# Patient Record
Sex: Male | Born: 1970
Health system: Southern US, Community
[De-identification: ages and names within clinical notes are randomized; demographics above are authoritative.]

## PROBLEM LIST (undated history)

## (undated) DIAGNOSIS — D869 Sarcoidosis, unspecified: Secondary | ICD-10-CM

## (undated) DIAGNOSIS — N049 Nephrotic syndrome with unspecified morphologic changes: Secondary | ICD-10-CM

## (undated) DIAGNOSIS — I10 Essential (primary) hypertension: Secondary | ICD-10-CM

## (undated) DIAGNOSIS — R0600 Dyspnea, unspecified: Secondary | ICD-10-CM

## (undated) DIAGNOSIS — M1990 Unspecified osteoarthritis, unspecified site: Secondary | ICD-10-CM

## (undated) DIAGNOSIS — G4730 Sleep apnea, unspecified: Secondary | ICD-10-CM

## (undated) DIAGNOSIS — I509 Heart failure, unspecified: Secondary | ICD-10-CM

## (undated) DIAGNOSIS — R7303 Prediabetes: Secondary | ICD-10-CM

## (undated) HISTORY — DX: Sarcoidosis, unspecified: D86.9

## (undated) HISTORY — DX: Nephrotic syndrome with unspecified morphologic changes: N04.9

---

## 1997-08-02 ENCOUNTER — Emergency Department (HOSPITAL_COMMUNITY): Admission: EM | Admit: 1997-08-02 | Discharge: 1997-08-02 | Payer: Self-pay | Admitting: Emergency Medicine

## 1998-06-30 ENCOUNTER — Emergency Department (HOSPITAL_COMMUNITY): Admission: EM | Admit: 1998-06-30 | Discharge: 1998-06-30 | Payer: Self-pay | Admitting: Emergency Medicine

## 2007-08-09 ENCOUNTER — Emergency Department (HOSPITAL_COMMUNITY): Admission: EM | Admit: 2007-08-09 | Discharge: 2007-08-09 | Payer: Self-pay | Admitting: Emergency Medicine

## 2009-05-19 ENCOUNTER — Emergency Department (HOSPITAL_COMMUNITY): Admission: EM | Admit: 2009-05-19 | Discharge: 2009-05-19 | Payer: Self-pay | Admitting: Family Medicine

## 2009-06-18 ENCOUNTER — Emergency Department (HOSPITAL_COMMUNITY): Admission: EM | Admit: 2009-06-18 | Discharge: 2009-06-18 | Payer: Self-pay | Admitting: Family Medicine

## 2014-03-26 ENCOUNTER — Emergency Department (HOSPITAL_COMMUNITY)
Admission: EM | Admit: 2014-03-26 | Discharge: 2014-03-26 | Disposition: A | Discharge status: Home / Self Care | Payer: 59 | Source: Home / Self Care | Attending: Family Medicine | Admitting: Family Medicine

## 2014-03-26 ENCOUNTER — Encounter (HOSPITAL_COMMUNITY): Payer: Self-pay | Admitting: *Deleted

## 2014-03-26 DIAGNOSIS — H6981 Other specified disorders of Eustachian tube, right ear: Secondary | ICD-10-CM

## 2014-03-26 HISTORY — DX: Sarcoidosis, unspecified: D86.9

## 2014-03-26 MED ORDER — IPRATROPIUM BROMIDE 0.06 % NA SOLN: 2.0000 | Freq: Four times a day (QID) | NASAL | Status: DC

## 2014-03-26 NOTE — ED Notes (Signed)
Pt  Reports  His  r  Ear  Feels   Clogged    As    Had   Some  Pain  Earlier       - pain is    Better  Now        He  denys  Any   Shortness  Of  Breath    He  Is  Sitting  Upright  On   Exam    Table  Pt  Has  A  History  Of  Sarcoidosis

## 2014-03-26 NOTE — Discharge Instructions (Signed)
Eustachian tube dysfunction Barotitis media (eustachian tube dysfunction) is inflammation of your middle ear. This occurs when the auditory tube (eustachian tube) leading from the back of your nose (nasopharynx) to your eardrum is blocked. This blockage may result from a cold, environmental allergies, or an upper respiratory infection. Unresolved barotitis media may lead to damage or hearing loss (barotrauma), which may become permanent. HOME CARE INSTRUCTIONS   Use medicines as recommended by your health care provider. Over-the-counter medicines will help unblock the canal and can help during times of air travel.  Do not put anything into your ears to clean or unplug them. Eardrops will not be helpful.  Do not swim, dive, or fly until your health care provider says it is all right to do so. If these activities are necessary, chewing gum with frequent, forceful swallowing may help. It is also helpful to hold your nose and gently blow to pop your ears for equalizing pressure changes. This forces air into the eustachian tube.  Only take over-the-counter or prescription medicines for pain, discomfort, or fever as directed by your health care provider.  A decongestant may be helpful in decongesting the middle ear and make pressure equalization easier. SEEK MEDICAL CARE IF:  You experience a serious form of dizziness in which you feel as if the room is spinning and you feel nauseated (vertigo).  Your symptoms only involve one ear. SEEK IMMEDIATE MEDICAL CARE IF:   You develop a severe headache, dizziness, or severe ear pain.  You have bloody or pus-like drainage from your ears.  You develop a fever.  Your problems do not improve or become worse. MAKE SURE YOU:   Understand these instructions.  Will watch your condition.  Will get help right away if you are not doing well or get worse. Document Released: 01/23/2000 Document Revised: 11/15/2012 Document Reviewed: 08/22/2012 [facility]  Patient Information 2015 [employer] [place]. This information is not intended to replace advice given to you by your health care provider. Make sure you discuss any questions you have with your health care provider.

## 2014-03-26 NOTE — ED Provider Notes (Signed)
CSN: [id number]     Arrival date & time 03/26/14  1505 History   First MD Initiated Contact with Patient 03/26/14 1530     Chief Complaint  Patient presents with  . Otalgia   (Consider location/radiation/quality/duration/timing/severity/associated sxs/prior Treatment) HPI   Patient here with 3 weeks of right ear congestion and muffled hearing intermittently. He explains that it began without any cold symptoms and has persisted throughout this time. He's tried mucinex without any improvement. He states that is not painful but it's irritating. He denies dyspnea, chest pain, change in oral intake, fever, chills, abdominal pain, or swelling. States he has a history of sarcoidosis and has not been taking prednisone for at least 6 months.    Past Medical History  Diagnosis Date  . Sarcoid    History reviewed. No pertinent past surgical history. History reviewed. No pertinent family history. History  Substance Use Topics  . Smoking status: Current Some Day Smoker  . Smokeless tobacco: Not on file  . Alcohol Use: Yes    Review of Systems   Per HPI  Allergies  Review of patient's allergies indicates no known allergies.  Home Medications   Prior to Admission medications   Medication Sig Start Date End Date Taking? Authorizing Provider  ipratropium (ATROVENT) 0.06 % nasal spray Place 2 sprays into both nostrils 4 (four) times daily. 03/26/14   Elenora GammaSamuel L Bradshaw, MD   BP 168/95 mmHg  Pulse 88  Temp(Src) 98.9 F (37.2 C) (Oral)  Resp 16  SpO2 93% Physical Exam  Constitutional: He appears well-developed and well-nourished. No distress.  HENT:  Head: Normocephalic and atraumatic.  Eyes: Right eye exhibits no discharge. Left eye exhibits no discharge.  Neck: Neck supple.  Cardiovascular: Normal rate, regular rhythm and normal heart sounds.  Exam reveals no friction rub.   No murmur heard. Pulmonary/Chest: Effort normal and breath sounds normal. No respiratory distress.   Musculoskeletal: He exhibits no edema.  Lymphadenopathy:    He has no cervical adenopathy.  Nursing note and vitals reviewed.   ED Course  Procedures (including critical care time) Labs Review Labs Reviewed - No data to display  Imaging Review No results found.   MDM   1. Eustachian tube dysfunction, right    44 year old male with sarcoidosis here with eustachian tube dysfunction. Discussed supportive care with him including nasal saline and over-the-counter allergy medications. Also given a prescription for Atrovent nasal spray to help dry up secretions. No signs of otitis media on exam.   Discussed extensively with him his hypertension and borderline oxygen saturations. He has a history of sarcoidosis and has not been off of prednisone for several months. I recommended that he establish care with a PCP and return to see his pulmonologist.  Pt seen and discussed with Dr. Artis FlockKindl and he agrees with the plan as discussed above.      Elenora GammaSamuel L Bradshaw, MD 03/26/14 [phone]

## 2014-07-02 ENCOUNTER — Institutional Professional Consult (permissible substitution): Payer: 59 | Admitting: Pulmonary Disease

## 2014-09-05 ENCOUNTER — Encounter: Payer: Self-pay | Admitting: Dietician

## 2014-09-05 ENCOUNTER — Encounter: Payer: 59 | Attending: Physician Assistant | Admitting: Dietician

## 2014-09-05 DIAGNOSIS — Z6841 Body Mass Index (BMI) 40.0 and over, adult: Secondary | ICD-10-CM | POA: Diagnosis not present

## 2014-09-05 DIAGNOSIS — Z713 Dietary counseling and surveillance: Secondary | ICD-10-CM | POA: Diagnosis not present

## 2014-09-05 NOTE — Patient Instructions (Signed)
Aim to have 3 meals per day and 2-3 snacks per day if you are hungry Choose a lower fat milk or unsweetened almond milk. Have protein (egg) with carbs (cereal/bread) for breakfast. Aim to fill half of your plate with vegetables at lunch and dinner. Have lean protein the size of the palm of your hand and starches on the quarter of your plate.  If you do the Nutribullet have 1 cup of fruit, greens, and have with protein (nuts).  Try using a small plate or small bowl to help with serving sizes.  Plan to walk 2-3 x week for 35-40 minutes.

## 2014-09-05 NOTE — Progress Notes (Signed)
  Medical Nutrition Therapy:  Appt start time: 1120 end time:  1220.   Assessment:  Primary concerns today: Hunter [name] is here today since he would like to lose weight. Started to feel bloated in April even after not eating for awhile and not sure why. Doctor was not sure why he was bloated either. Has been having some swelling in legs and feet. Was given cholesterol medication and blood pressure medication but stopped them since he did not feel good taking them. Weight has been 280-295 lbs in recent years and is now 307 lbs (highest weight was 315 lbs). Started taking Hydrycut for about 30 days which has been working. Eats a lot of fruits and vegetables but struggles to be able to shop frequently. Does not eat greasy foods or foods with butter. Has not been exercising much though does play basketball sometimes.   "Loves to eat" and thinks that he overeats. Would like to lose at least 50 lbs.   Drives a forklift for work. Will be starting back to work soon (unemployed now). Lives by himself. Eats out 4-5 x week and misses meals frequently. Feels hungry throughout the day. Portions have been small lately.    Has cut out beer, cakes, cookies, and chips.    Preferred Learning Style:   No preference indicated   Learning Readiness:   Ready  MEDICATIONS: no prescription medication   DIETARY INTAKE:  Usual eating pattern includes 1-3 meals and 1-3 snacks per day.  Avoided foods include none    24-hr recall:  B ( AM): nothing or Cheerios/Raisin Bran with whole milk or coffee with flavored creamed with bagel and cream Snk ( AM):crackers with glass of tomato juice  L ( PM): sandwich with ham, cheese, mayo, and bologna with fruit or tuna sandwich Snk ( PM):  Cheerios/Raisin Bran with whole milk or fruit D ( PM): steak with corn  Snk ( PM): Cheerios/Raisin Bran with whole milk  Beverages: water, tomato juice, coffee, 1 mixed drink per week (gin and juice)  Usual physical activity: basketball  every other week  Estimated energy needs: 2200 calories 248 g carbohydrates 165 g protein 61 g fat  Progress Towards Goal(s):  In progress.   Nutritional Diagnosis:  Leon-3.3 Overweight/obesity As related to hx of energy dense food choices.  As evidenced by BMI of 41.7.    Intervention:  Nutrition counseling provided. Plan: Aim to have 3 meals per day and 2-3 snacks per day if you are hungry Choose a lower fat milk or unsweetened almond milk. Have protein (egg) with carbs (cereal/bread) for breakfast. Aim to fill half of your plate with vegetables at lunch and dinner. Have lean protein the size of the palm of your hand and starches on the quarter of your plate.  If you do the Nutribullet have 1 cup of fruit, greens, and have with protein (nuts).  Try using a small plate or small bowl to help with serving sizes.  Plan to walk 2-3 x week for 35-40 minutes.  Teaching Method Utilized:  Visual Auditory Hands on  Handouts given during visit include:  Meal card  MyPlate Handout  15 g CHO Snacks  Barriers to learning/adherence to lifestyle change: lives alone  Demonstrated degree of understanding via:  Teach Back   Monitoring/Evaluation:  Dietary intake, exercise, and body weight prn.

## 2014-11-04 ENCOUNTER — Other Ambulatory Visit: Payer: Self-pay | Admitting: Cardiology

## 2014-11-04 ENCOUNTER — Encounter: Payer: Self-pay | Admitting: Cardiology

## 2014-11-04 ENCOUNTER — Ambulatory Visit
Admission: RE | Admit: 2014-11-04 | Discharge: 2014-11-04 | Disposition: A | Discharge status: Home / Self Care | Payer: 59 | Source: Ambulatory Visit | Attending: Cardiology | Admitting: Cardiology

## 2014-11-04 DIAGNOSIS — R0602 Shortness of breath: Secondary | ICD-10-CM

## 2014-11-15 ENCOUNTER — Other Ambulatory Visit: Payer: Self-pay | Admitting: Cardiology

## 2014-11-15 DIAGNOSIS — R0602 Shortness of breath: Secondary | ICD-10-CM

## 2014-11-25 ENCOUNTER — Ambulatory Visit
Admission: RE | Admit: 2014-11-25 | Discharge: 2014-11-25 | Disposition: A | Discharge status: Home / Self Care | Payer: 59 | Source: Ambulatory Visit | Attending: Cardiology | Admitting: Cardiology

## 2014-11-25 DIAGNOSIS — R0602 Shortness of breath: Secondary | ICD-10-CM

## 2014-11-25 MED ORDER — IOPAMIDOL (ISOVUE-300) INJECTION 61%
75.0000 mL | Freq: Once | INTRAVENOUS | Status: AC | PRN
  Administered 2014-11-25: 75 mL via INTRAVENOUS

## 2015-03-26 ENCOUNTER — Other Ambulatory Visit (HOSPITAL_COMMUNITY): Payer: Self-pay | Admitting: Nephrology

## 2015-03-26 DIAGNOSIS — D869 Sarcoidosis, unspecified: Secondary | ICD-10-CM

## 2015-03-26 DIAGNOSIS — N049 Nephrotic syndrome with unspecified morphologic changes: Secondary | ICD-10-CM

## 2015-03-26 DIAGNOSIS — I272 Pulmonary hypertension, unspecified: Secondary | ICD-10-CM

## 2015-03-26 DIAGNOSIS — I50811 Acute right heart failure: Secondary | ICD-10-CM

## 2015-03-26 DIAGNOSIS — G4700 Insomnia, unspecified: Secondary | ICD-10-CM

## 2015-05-02 ENCOUNTER — Other Ambulatory Visit: Payer: Self-pay | Admitting: Radiology

## 2015-05-05 ENCOUNTER — Ambulatory Visit (HOSPITAL_COMMUNITY)
Admission: RE | Admit: 2015-05-05 | Discharge: 2015-05-05 | Disposition: A | Discharge status: Home / Self Care | Payer: 59 | Source: Ambulatory Visit | Attending: Nephrology | Admitting: Nephrology

## 2015-05-05 ENCOUNTER — Ambulatory Visit (HOSPITAL_COMMUNITY)
Admission: RE | Admit: 2015-05-05 | Discharge: 2015-05-05 | Disposition: A | Discharge status: Home / Self Care | Payer: 59 | Source: Ambulatory Visit | Attending: Interventional Radiology | Admitting: Interventional Radiology

## 2015-05-05 ENCOUNTER — Encounter (HOSPITAL_COMMUNITY): Payer: Self-pay

## 2015-05-05 DIAGNOSIS — E1121 Type 2 diabetes mellitus with diabetic nephropathy: Secondary | ICD-10-CM | POA: Diagnosis not present

## 2015-05-05 DIAGNOSIS — I10 Essential (primary) hypertension: Secondary | ICD-10-CM | POA: Diagnosis not present

## 2015-05-05 DIAGNOSIS — N049 Nephrotic syndrome with unspecified morphologic changes: Secondary | ICD-10-CM | POA: Insufficient documentation

## 2015-05-05 DIAGNOSIS — R809 Proteinuria, unspecified: Secondary | ICD-10-CM | POA: Diagnosis not present

## 2015-05-05 DIAGNOSIS — I272 Other secondary pulmonary hypertension: Secondary | ICD-10-CM | POA: Diagnosis not present

## 2015-05-05 DIAGNOSIS — I50811 Acute right heart failure: Secondary | ICD-10-CM

## 2015-05-05 DIAGNOSIS — D869 Sarcoidosis, unspecified: Secondary | ICD-10-CM | POA: Insufficient documentation

## 2015-05-05 DIAGNOSIS — R0902 Hypoxemia: Secondary | ICD-10-CM | POA: Diagnosis not present

## 2015-05-05 DIAGNOSIS — Z79899 Other long term (current) drug therapy: Secondary | ICD-10-CM | POA: Diagnosis not present

## 2015-05-05 DIAGNOSIS — F17210 Nicotine dependence, cigarettes, uncomplicated: Secondary | ICD-10-CM | POA: Insufficient documentation

## 2015-05-05 DIAGNOSIS — J90 Pleural effusion, not elsewhere classified: Secondary | ICD-10-CM | POA: Diagnosis not present

## 2015-05-05 DIAGNOSIS — G4700 Insomnia, unspecified: Secondary | ICD-10-CM

## 2015-05-05 DIAGNOSIS — N269 Renal sclerosis, unspecified: Secondary | ICD-10-CM | POA: Diagnosis not present

## 2015-05-05 HISTORY — DX: Essential (primary) hypertension: I10

## 2015-05-05 LAB — CBC
HEMATOCRIT: 57.2 % — AB (ref 39.0–52.0)
HEMOGLOBIN: 17.4 g/dL — AB (ref 13.0–17.0)
MCH: 26.9 pg (ref 26.0–34.0)
MCHC: 30.4 g/dL (ref 30.0–36.0)
MCV: 88.3 fL (ref 78.0–100.0)
Platelets: 139 10*3/uL — ABNORMAL LOW (ref 150–400)
RBC: 6.48 MIL/uL — AB (ref 4.22–5.81)
RDW: 16.8 % — AB (ref 11.5–15.5)
WBC: 3.3 10*3/uL — AB (ref 4.0–10.5)

## 2015-05-05 LAB — PROTIME-INR
INR: 0.96 (ref 0.00–1.49)
Prothrombin Time: 13 seconds (ref 11.6–15.2)

## 2015-05-05 LAB — APTT: APTT: 30 s (ref 24–37)

## 2015-05-05 MED ORDER — FENTANYL CITRATE (PF) 100 MCG/2ML IJ SOLN
INTRAMUSCULAR | Status: AC | PRN
  Administered 2015-05-05 (×2): 25 ug via INTRAVENOUS

## 2015-05-05 MED ORDER — HYDRALAZINE HCL 20 MG/ML IJ SOLN
INTRAMUSCULAR | Status: AC
  Filled 2015-05-05: qty 2

## 2015-05-05 MED ORDER — HYDRALAZINE HCL 20 MG/ML IJ SOLN
10.0000 mg | Freq: Once | INTRAMUSCULAR | Status: AC
  Administered 2015-05-05: 10 mg via INTRAVENOUS

## 2015-05-05 MED ORDER — LIDOCAINE HCL (PF) 1 % IJ SOLN
INTRAMUSCULAR | Status: AC
  Filled 2015-05-05: qty 10

## 2015-05-05 MED ORDER — HYDROCODONE-ACETAMINOPHEN 5-325 MG PO TABS: 1.0000 | ORAL_TABLET | ORAL | Status: DC | PRN

## 2015-05-05 MED ORDER — FENTANYL CITRATE (PF) 100 MCG/2ML IJ SOLN
INTRAMUSCULAR | Status: AC
  Filled 2015-05-05: qty 2

## 2015-05-05 MED ORDER — MIDAZOLAM HCL 2 MG/2ML IJ SOLN
INTRAMUSCULAR | Status: AC | PRN
Start: 2015-05-05 — End: 2015-05-05
  Administered 2015-05-05: 0.5 mg via INTRAVENOUS

## 2015-05-05 MED ORDER — MIDAZOLAM HCL 2 MG/2ML IJ SOLN
INTRAMUSCULAR | Status: AC
  Filled 2015-05-05: qty 2

## 2015-05-05 MED ORDER — SODIUM CHLORIDE 0.9 % IV SOLN: Freq: Once | INTRAVENOUS | Status: DC

## 2015-05-05 MED ORDER — LISINOPRIL 20 MG PO TABS
20.0000 mg | ORAL_TABLET | Freq: Once | ORAL | Status: AC
  Administered 2015-05-05: 20 mg via ORAL
  Filled 2015-05-05: qty 1

## 2015-05-05 MED ORDER — FUROSEMIDE 10 MG/ML IJ SOLN
40.0000 mg | Freq: Once | INTRAMUSCULAR | Status: AC
Start: 2015-05-05 — End: 2015-05-05
  Administered 2015-05-05: 40 mg via INTRAVENOUS
  Filled 2015-05-05: qty 4

## 2015-05-05 MED ORDER — FUROSEMIDE 10 MG/ML IJ SOLN: 40.0000 mg | Freq: Once | INTRAMUSCULAR | Status: DC

## 2015-05-05 NOTE — Progress Notes (Signed)
Pt awake and alert. In no distress. Here for renal biopsy. When placed on monitor pt with Sa02 68-72%. States he does not use oxygen at home. Also states he feels better and breathes better when he lays on his right side. When pt layed on right side Sa02 remained unchanged. Placed patient on 2L [place] with Sa02 88-90%.

## 2015-05-05 NOTE — Progress Notes (Signed)
Pt with Sa02 84-85% on RA. 91% on 3L [place].

## 2015-05-05 NOTE — Procedures (Signed)
Interventional Radiology Procedure Note  Procedure:  Ultrasound guided left renal biopsy  Complications:  None  Estimated Blood Loss: < 10 mL  16 G core biopsy performed x 2 of lower pole left renal cortex.  No immediate complications.  Jodi MarbleGlenn T. Fredia SorrowYamagata, M.D Pager:  [phone]

## 2015-05-05 NOTE — Progress Notes (Signed)
Sa02 95% on 3L [place]

## 2015-05-05 NOTE — H&P (Signed)
Chief Complaint: Patient was seen in consultation today for random renal biopsy at the request of Hunter Hunter [name]  Referring Physician(s): Hunter Hunter [name]  Supervising Physician: Hunter Hunter [name]  History of Present Illness: Hunter Hunter [name] is Hunter [name] 45 y.o. male   Pt presented to MD in May 2016 Complaining of wt gain and lower extremity edema Shortness of breath Work up revealed Pulmonary HTN---CT excluded PE + Sarcoidosis + proteinuria Nephrotic syndrome Request for random renal biopsy per Hunter Hunter [name]  + smoker Restless leg syndrome  Pt presents today with 02 sat 60-70% RA 95% 3L 02 BP 162/105 - without am meds not sure what he runs at home    Past Medical History  Diagnosis Date  . Sarcoid (HCC)   . Hypertension     History reviewed. No pertinent past surgical history.  Allergies: Review of patient's allergies indicates no known allergies.  Medications: Prior to Admission medications   Medication Sig Start Date End Date Taking? Authorizing Provider  atorvastatin (LIPITOR) 20 MG tablet Take 20 mg by mouth daily. 03/06/15  Yes Historical Provider, MD  furosemide (LASIX) 40 MG tablet Take 40 mg by mouth daily. 03/06/15  Yes Historical Provider, MD  lisinopril (PRINIVIL,ZESTRIL) 20 MG tablet Take 20 mg by mouth daily. 03/06/15  Yes Historical Provider, MD  Multiple Vitamin (MULTIVITAMIN WITH MINERALS) TABS tablet Take 1 tablet by mouth daily.   Yes Historical Provider, MD  naproxen sodium (ALEVE) 220 MG tablet Take 220 mg by mouth 2 (two) times daily as needed (pain).   Yes Historical Provider, MD  zolpidem (AMBIEN) 5 MG tablet Take 5 mg by mouth at bedtime as needed. 03/19/15  Yes Historical Provider, MD  ipratropium (ATROVENT) 0.06 % nasal spray Place 2 sprays into both nostrils 4 (four) times daily. Patient not taking: Reported on 05/02/2015 03/26/14   Hunter Gamma, MD     History reviewed. No pertinent family history.  Social History   Social  History  . Marital Status: Single    Spouse Name: N/Hunter [name]  . Number of Children: N/Hunter [name]  . Years of Education: N/Hunter [name]   Social History Main Topics  . Smoking status: Current Some Day Smoker -- 0.50 packs/day for 10 years    Types: Cigarettes  . Smokeless tobacco: None  . Alcohol Use: 1.8 oz/week    3 Cans of beer per week  . Drug Use: None  . Sexual Activity: Not Asked   Other Topics Concern  . None   Social History Narrative     Review of Systems: Hunter [name] 12 point ROS discussed and pertinent positives are indicated in the HPI above.  All other systems are negative.  Review of Systems  Constitutional: Positive for diaphoresis, activity change, appetite change and unexpected weight change. Negative for fever.  Respiratory: Positive for cough, shortness of breath and wheezing.   Cardiovascular: Positive for leg swelling. Negative for chest pain.  Neurological: Positive for weakness.  Psychiatric/Behavioral: Negative for behavioral problems and confusion.    Vital Signs: BP 170/112 mmHg  Pulse 82  Temp(Src) 98.2 F (36.8 C) (Oral)  Resp 22  Ht 6' (1.829 m)  Wt 307 lb (139.254 kg)  BMI 41.63 kg/m2  SpO2 75%  Physical Exam  Constitutional: He is oriented to person, place, and time.  Cardiovascular: Normal rate, regular rhythm and normal heart sounds.   Pulmonary/Chest: Effort normal. He has wheezes. He has rales.  Abdominal: Soft. Bowel sounds are normal. There is no tenderness.  Neurological: He is alert and oriented to  person, place, and time.  Skin: Skin is warm and dry.  Psychiatric: He has Hunter [name] normal mood and affect. His behavior is normal. Judgment and thought content normal.  Nursing note and vitals reviewed.   Mallampati Score:  MD Evaluation Airway: WNL Heart: WNL Abdomen: WNL Chest/ Lungs: WNL ASA  Classification: 3 Mallampati/Airway Score: One  Imaging: No results found.  Labs:  CBC:  Recent Labs  05/05/15 0620  WBC 3.3*  HGB 17.4*  HCT 57.2*  PLT 139*     COAGS:  Recent Labs  05/05/15 0620  INR 0.96  APTT 30    BMP: No results for input(s): NA, K, CL, CO2, GLUCOSE, BUN, CALCIUM, CREATININE, GFRNONAA, GFRAA in the last 8760 hours.  Invalid input(s): CMP  LIVER FUNCTION TESTS: No results for input(s): BILITOT, AST, ALT, ALKPHOS, PROT, ALBUMIN in the last 8760 hours.  TUMOR MARKERS: No results for input(s): AFPTM, CEA, CA199, CHROMGRNA in the last 8760 hours.  Assessment and Plan:  Wt gain Lower extremity edema Proteinuria Nephrotic syndrome Pulm HTN; rales and wheezing on exam----for CXR per Hunter Fredia SorrowYamagata Risks and Benefits discussed with the patient including, but not limited to bleeding, infection, damage to adjacent structures or low yield requiring additional tests. All of the patient's questions were answered, patient is agreeable to proceed. Consent signed and in chart.  Checking CXR and BP   Thank you for this interesting consult.  I greatly enjoyed meeting Hunter Hunter [name] and look forward to participating in their care.  Hunter [name] copy of this report was sent to the requesting provider on this date.  Electronically Signed: Ralene MuskratURPIN,Hunter Hunter [name] 05/05/2015, 7:50 AM   I spent Hunter [name] total of  30 Minutes   in face to face in clinical consultation, greater than 50% of which was counseling/coordinating care for random renal bx

## 2015-05-05 NOTE — Progress Notes (Signed)
Pt upset and ready to go home. Agitated. Sats 95% BP improved

## 2015-05-05 NOTE — Sedation Documentation (Addendum)
Pt states he is breathing fine- no distress noted. When pt arrived this am he was in 70's R AIR and in no distress.]MD aware

## 2015-05-05 NOTE — Progress Notes (Signed)
Pt sleeping quietly. Remains on 3 L [place].

## 2015-05-05 NOTE — Progress Notes (Signed)
Pt VS stable, Sats 91 % RAIR- improved from RAIR sats on arrival. Pts friend will pick him up at 3:45 at main entrance. Pt feels good, bandaid dry, intact

## 2015-05-21 ENCOUNTER — Encounter (HOSPITAL_COMMUNITY): Payer: Self-pay

## 2015-05-22 ENCOUNTER — Encounter (HOSPITAL_COMMUNITY): Payer: Self-pay

## 2015-12-29 LAB — PULMONARY FUNCTION TEST

## 2016-02-11 ENCOUNTER — Other Ambulatory Visit: Payer: Self-pay | Admitting: Internal Medicine

## 2016-02-11 DIAGNOSIS — R0600 Dyspnea, unspecified: Secondary | ICD-10-CM

## 2016-02-12 ENCOUNTER — Ambulatory Visit (INDEPENDENT_AMBULATORY_CARE_PROVIDER_SITE_OTHER): Payer: 59 | Admitting: Internal Medicine

## 2016-02-12 DIAGNOSIS — R0600 Dyspnea, unspecified: Secondary | ICD-10-CM

## 2016-02-12 LAB — PULMONARY FUNCTION TEST
DL/VA % PRED: 137 %
DL/VA: 6.5 ml/min/mmHg/L
DLCO COR: 21.05 ml/min/mmHg
DLCO UNC % PRED: 62 %
DLCO cor % pred: 61 %
DLCO unc: 21.56 ml/min/mmHg
FEF 25-75 Post: 1.18 L/sec
FEF 25-75 Pre: 0.74 L/sec
FEF2575-%CHANGE-POST: 60 %
FEF2575-%PRED-POST: 33 %
FEF2575-%PRED-PRE: 20 %
FEV1-%Change-Post: 10 %
FEV1-%PRED-PRE: 34 %
FEV1-%Pred-Post: 37 %
FEV1-Post: 1.36 L
FEV1-Pre: 1.23 L
FEV1FVC-%CHANGE-POST: 5 %
FEV1FVC-%Pred-Pre: 89 %
FEV6-%Change-Post: 4 %
FEV6-%PRED-PRE: 39 %
FEV6-%Pred-Post: 41 %
FEV6-PRE: 1.72 L
FEV6-Post: 1.8 L
FEV6FVC-%Change-Post: 0 %
FEV6FVC-%PRED-PRE: 102 %
FEV6FVC-%Pred-Post: 103 %
FVC-%Change-Post: 4 %
FVC-%PRED-PRE: 38 %
FVC-%Pred-Post: 40 %
FVC-POST: 1.8 L
FVC-PRE: 1.72 L
POST FEV6/FVC RATIO: 100 %
PRE FEV1/FVC RATIO: 71 %
Post FEV1/FVC ratio: 75 %
Pre FEV6/FVC Ratio: 100 %
RV % pred: 105 %
RV: 2.18 L
TLC % pred: 55 %
TLC: 3.99 L

## 2016-02-18 ENCOUNTER — Telehealth: Payer: Self-pay | Admitting: Pulmonary Disease

## 2016-02-18 ENCOUNTER — Encounter: Payer: Self-pay | Admitting: Pulmonary Disease

## 2016-02-18 ENCOUNTER — Ambulatory Visit (INDEPENDENT_AMBULATORY_CARE_PROVIDER_SITE_OTHER): Payer: 59 | Admitting: Pulmonary Disease

## 2016-02-18 ENCOUNTER — Ambulatory Visit (INDEPENDENT_AMBULATORY_CARE_PROVIDER_SITE_OTHER)
Admission: RE | Admit: 2016-02-18 | Discharge: 2016-02-18 | Disposition: A | Discharge status: Home / Self Care | Payer: 59 | Source: Ambulatory Visit | Attending: Pulmonary Disease | Admitting: Pulmonary Disease

## 2016-02-18 VITALS — BP 128/84 | HR 84 | Ht 72.0 in | Wt 299.6 lb

## 2016-02-18 DIAGNOSIS — R0902 Hypoxemia: Secondary | ICD-10-CM

## 2016-02-18 DIAGNOSIS — D869 Sarcoidosis, unspecified: Secondary | ICD-10-CM

## 2016-02-18 NOTE — Telephone Encounter (Signed)
lmtcb x1 for Duke EnergyKendra.

## 2016-02-18 NOTE — Assessment & Plan Note (Signed)
O2 saturation was 87% on room air sitting. I'm not sure if his fingers were cold with the cold weather. It could be from obesity hypoventilation syndrome as well as obstructive sleep apnea. Plan for ABG. Patient denies symptoms of sleep apnea such as snoring, hypersomnia, gasping, choking.

## 2016-02-18 NOTE — Patient Instructions (Signed)
  It was a pleasure taking care of you today!  We will schedule you for a blood test and chest x-ray.  Follow up in 4-6 weeks with Dr. Christene Slatese Dios or NP

## 2016-02-18 NOTE — Assessment & Plan Note (Signed)
Pt  was diagnosed with Sarcoidosis in 2002. He had a skin biopsy which showed findings consistent with sarcoidosis. He was on prednisone  which was weaned off in 2007.   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 According to him, he does not have any issues with work. He gets winded with more than ADLs. This is chronic. Most likely related to his weight.  He is currently applying for a new job. As part of that application, he needed a PFT and pulmonary clearance.  He feels fine otherwise.  He denies snoring, gasping, choking, fatigue, hypersomnia. He states he feels rested when he wakes up in the morning. Denies hypersomnia.  PFT done on 02/12/2016: FEV1/FVC ratio was 71, FEV1 was 1.23 or 34% of predicted. FVC was 1.72 or 30% of predicted. TLC was 3.99 or 55% of predicted. Diffusion capacity was 62%.  Patient had a chest ct scan in October 2016 which showed prominent mediastinal lymphadenopathy.   Plan : 1. I extensively discussed with the patient findings in the pulmonary function tests. He has severe restriction related to sarcoidosis and morbid obesity. He claims he gets short of breath with more than ADLs. 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 He claims he can wear a respirator.  I just wanted to get a chest x-ray to follow-up on his sarcoidosis. His O2 saturation was 87% on room air at rest. I'm not sure if that's because he had cold fingers related to the cold weather. Plan to get an ABG. Obviously, he can have obesity hypoventilation syndrome causing hypoxemia and hypercapnia. I told patient, I will wait on the chest x-ray and ABG before I filled out the form for his work requirement.

## 2016-02-18 NOTE — Progress Notes (Signed)
Subjective:    Patient ID: Hunter [name], male    DOB: 04/15/1970, 46 y.o.   MRN: [id number]  HPI   This is the case of Hunter [name], 46 y.o. Male, who was referred by Dr. Gardner CandleElizabeth Goldsborough in consultation regarding abnormal PFT.   As you very well know, patient has a 10 PY smoking history, smokes 1/2 PPD, has not been diagnosed with asthma or copd. Has sinus allergies during summer.   He has CKD controlled with meds.  Not on HD.   He was diagnosed with Sarcoidosis in 2002. He had a skin biopsy which showed findings consistent with sarcoidosis. He was on prednisone  which was weaned off in 2007.   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 According to him, he does not have any issues with work. He gets winded with more than ADLs. This is chronic. Most likely related to his weight.  He is currently applying for a new job. As part of that application, he needed a PFT and pulmonary clearance.  He feels fine otherwise.  He denies snoring, gasping, choking, fatigue, hypersomnia. He states he feels rested when he wakes up in the morning. Denies hypersomnia.  PFT done on 02/12/2016: FEV1/FVC ratio was 71, FEV1 was 1.23 or 34% of predicted. FVC was 1.72 or 30% of predicted. TLC was 3.99 or 55% of predicted. Diffusion capacity was 62%.  Patient had a chest ct scan in October 2016 which showed prominent mediastinal lymphadenopathy.    Review of Systems  Constitutional: Negative.  Negative for fever and unexpected weight change.  HENT: Negative.  Negative for congestion, dental problem, ear pain, nosebleeds, postnasal drip, rhinorrhea, sinus pressure, sneezing, sore throat and trouble swallowing.   Eyes: Negative.  Negative for redness and itching.  Respiratory: Positive for shortness of breath. Negative for cough, chest tightness and wheezing.   Cardiovascular: Negative.  Negative for palpitations and leg swelling.    Gastrointestinal: Negative.  Negative for nausea and vomiting.  Endocrine: Negative.   Genitourinary: Negative.  Negative for dysuria.  Musculoskeletal: Negative.  Negative for joint swelling.  Skin: Negative.  Negative for rash.  Allergic/Immunologic: Positive for environmental allergies.  Neurological: Negative.  Negative for headaches.  Hematological: Negative.  Does not bruise/bleed easily.  Psychiatric/Behavioral: Negative.  Negative for dysphoric mood. The patient is not nervous/anxious.    Past Medical History:  Diagnosis Date  . Hypertension   . Sarcoid (HCC)    CKD.  (-) CA, DVT  No family history on file.  Father had an accident. Mother had CAD.   No past surgical history on file.  Denies surgery.   Social History   Social History  . Marital status: Single    Spouse name: N/A  . Number of children: N/A  . Years of education: N/A   Occupational History  . Not on file.   Social History Main Topics  . Smoking status: Current Some Day Smoker    Packs/day: 0.50    Years: 10.00    Types: Cigarettes  . Smokeless tobacco: Not on file  . Alcohol use 1.8 oz/week    3 Cans of beer per week  . Drug use: Unknown  . Sexual activity: Not on file   Other Topics Concern  . Not on file   Social History Narrative  . No narrative on file   Single, [place] by himself[place] 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  Occasional ETOH.   Allergies  Allergen Reactions  . Thalidomide Other (See Comments)    Derm,resp     Outpatient Medications Prior to Visit  Medication Sig Dispense Refill  . atorvastatin (LIPITOR) 20 MG tablet Take 20 mg by mouth daily.  2  . furosemide (LASIX) 40 MG tablet Take 40 mg by mouth daily.  0  . lisinopril (PRINIVIL,ZESTRIL) 20 MG tablet Take 20 mg by mouth daily.  6  . ipratropium (ATROVENT) 0.06 % nasal spray Place 2 sprays into both nostrils 4 (four) times daily. (Patient not taking: Reported on 02/18/2016)  15 mL 3  . naproxen sodium (ALEVE) 220 MG tablet Take 220 mg by mouth 2 (two) times daily as needed (pain).    Marland Kitchen zolpidem (AMBIEN) 5 MG tablet Take 5 mg by mouth at bedtime as needed.  0  . Multiple Vitamin (MULTIVITAMIN WITH MINERALS) TABS tablet Take 1 tablet by mouth daily.     No facility-administered medications prior to visit.    No orders of the defined types were placed in this encounter.       Objective:   Physical Exam  Vitals:  Vitals:   02/18/16 1332  BP: 128/84  Pulse: 84  SpO2: (!) 87%  Weight: 299 lb 9.6 oz (135.9 kg)  Height: 6' (1.829 m)    Constitutional/General:  Pleasant, well-nourished, well-developed, not in any distress,  Comfortably seating.  Well kempt  Body mass index is 40.63 kg/m. Wt Readings from Last 3 Encounters:  02/18/16 299 lb 9.6 oz (135.9 kg)  05/05/15 (!) 307 lb (139.3 kg)  09/05/14 (!) 307 lb 8 oz (139.5 kg)     HEENT: Pupils equal and reactive to light and accommodation. Anicteric sclerae. Normal nasal mucosa.   No oral  lesions,  mouth clear,  oropharynx clear, no postnasal drip. (-) Oral thrush. No dental caries.  Airway - Mallampati class III-IV  Neck: No masses. Midline trachea. No JVD, (-) LAD. (-) bruits appreciated.  Respiratory/Chest: Grossly normal chest. (-) deformity. (-) Accessory muscle use.  Symmetric expansion. (-) Tenderness on palpation.  Resonant on percussion.  Diminished BS on both lower lung zones. (-) wheezing, crackles, rhonchi (-) egophony  Cardiovascular: Regular rate and  rhythm, heart sounds normal, no murmur or gallops, no peripheral edema  Gastrointestinal:  Normal bowel sounds. Soft, non-tender. No hepatosplenomegaly.  (-) masses.   Musculoskeletal:  Normal muscle tone. Normal gait.   Extremities: Grossly normal. (-) clubbing, cyanosis.  (-) edema  Skin: (-) rash,lesions seen.   Neurological/Psychiatric : alert, oriented to time, place, person. Normal mood and affect            Assessment & Plan:  Sarcoidosis (HCC) Pt  was diagnosed with Sarcoidosis in 2002. He had a skin biopsy which showed findings consistent with sarcoidosis. He was on prednisone  which was weaned off in 2007.   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 According to him, he does not have any issues with work. He gets winded with more than ADLs. This is chronic. Most likely related to his weight.  He is currently applying for a new job. As part of that application, he needed a PFT and pulmonary clearance.  He feels fine otherwise.  He denies snoring, gasping, choking, fatigue, hypersomnia. He states he feels rested when he wakes up in the morning. Denies hypersomnia.  PFT done on 02/12/2016: FEV1/FVC ratio was 71, FEV1 was 1.23 or 34% of predicted. FVC was 1.72 or 30% of predicted. TLC was 3.99 or 55% of predicted. Diffusion capacity was 62%.  Patient had a chest ct scan in October 2016 which showed prominent mediastinal lymphadenopathy.   Plan : 1. I extensively discussed with the patient findings in the pulmonary function tests. He has severe restriction related to sarcoidosis and morbid obesity. He claims he gets short of breath with more than ADLs. He claims he can do the job description as a Estate agent. He claims he can wear a respirator.  I just wanted to get a chest x-ray to follow-up on his sarcoidosis. His O2 saturation was 87% on room air at rest. I'm not sure if that's because he had cold fingers related to the cold weather. Plan to get an ABG. Obviously, he can have obesity hypoventilation syndrome causing hypoxemia and hypercapnia. I told patient, I will wait on the chest x-ray and ABG before I filled out the form for his work requirement.  Hypoxemia O2 saturation was 87% on room air sitting. I'm not sure if his fingers were cold with the cold weather. It could be from obesity hypoventilation syndrome as well as  obstructive sleep apnea. Plan for ABG. Patient denies symptoms of sleep apnea such as snoring, hypersomnia, gasping, choking.  Morbid obesity (HCC) Weight reduction    Thank you very much for letting me participate in this patient's care. Please do not hesitate to give me a call if you have any questions or concerns regarding the treatment plan.   Patient will follow up with me in 4-6 weeks.     Pollie Meyer, MD 02/18/2016   2:14 PM Pulmonary and Critical Care Medicine [facility] Pager: [phone] Office: [phone], Fax: [phone]

## 2016-02-18 NOTE — Assessment & Plan Note (Signed)
Weight reduction 

## 2016-02-19 NOTE — Telephone Encounter (Signed)
[phone] Hunter SackKendra calling back

## 2016-02-19 NOTE — Telephone Encounter (Signed)
Spoke with Enrique SackKendra at [facility] and advised per ov notes with Dr Christene Slatese Dios on 02/18/16 we are waiting for results of lab and xray before Dr Christene Slatese Dios can fill out work requirement form.  Enrique SackKendra voiced understanding.  Nothing further needed.

## 2016-02-24 ENCOUNTER — Telehealth: Payer: Self-pay | Admitting: Pulmonary Disease

## 2016-02-24 NOTE — Telephone Encounter (Signed)
Jasmine please advise if you have seen any lab results come through on this pt to have AD review.  thanks

## 2016-02-27 NOTE — Telephone Encounter (Signed)
Spoke with Enrique Sackkendra with echo lab, who states clearance is needed due to pt's resting high blood pressure. Below is pt's instructions from 02-18-16 OV with AD. No labs were ordered. AD can you clarify what labs need to be ordered. Thanks.   Instructions    It was a pleasure taking care of you today!  We will schedule you for a blood test and chest x-ray.  Follow up in 4-6 weeks with Dr. Christene Slatese Dios or NP

## 2016-02-27 NOTE — Telephone Encounter (Signed)
   I am waiting on an ABG on this patient which he will get on Monday. Once with ABG, I will do pulmonary clearance.  Anything else I need to do?  J. Alexis FrockAngelo A de Dios, MD 02/27/2016, 11:59 AM [facility] Pager [phone] After 3 pm or if no answer, call [phone]

## 2016-02-27 NOTE — Telephone Encounter (Signed)
AD  I have left a message with echo lounge but have yet to hear from them. As of right now nothing further is needed from you.

## 2016-02-27 NOTE — Telephone Encounter (Signed)
[facility]  Pt. Is gettting a ABG, which he has scheduled for Mon. That is the blood work AD was looking for.

## 2016-02-27 NOTE — Telephone Encounter (Signed)
Spoke with Hunter [name] and informed her of what AD is recommending. She wanted to know when the results were going to come in. Informed her that I do not have a timeline of this. Will follow up with her once the results come in. Nothing further at this time is needed.

## 2016-03-01 ENCOUNTER — Ambulatory Visit (HOSPITAL_COMMUNITY)
Admission: RE | Admit: 2016-03-01 | Discharge: 2016-03-01 | Disposition: A | Discharge status: Home / Self Care | Payer: 59 | Source: Ambulatory Visit | Attending: Pulmonary Disease | Admitting: Pulmonary Disease

## 2016-03-01 DIAGNOSIS — D869 Sarcoidosis, unspecified: Secondary | ICD-10-CM | POA: Insufficient documentation

## 2016-03-01 LAB — BLOOD GAS, ARTERIAL
ACID-BASE EXCESS: 7.8 mmol/L — AB (ref 0.0–2.0)
BICARBONATE: 34.8 mmol/L — AB (ref 20.0–28.0)
Drawn by: 244901
FIO2: 21
O2 SAT: 83.9 %
PO2 ART: 52.1 mmHg — AB (ref 83.0–108.0)
Patient temperature: 98.6
pCO2 arterial: 59.1 mmHg — ABNORMAL HIGH (ref 32.0–48.0)
pH, Arterial: 7.388 (ref 7.350–7.450)

## 2016-03-02 ENCOUNTER — Telehealth: Payer: Self-pay | Admitting: Pulmonary Disease

## 2016-03-02 ENCOUNTER — Encounter: Payer: Self-pay | Admitting: Pulmonary Disease

## 2016-03-02 ENCOUNTER — Ambulatory Visit (INDEPENDENT_AMBULATORY_CARE_PROVIDER_SITE_OTHER): Payer: Self-pay | Admitting: Pulmonary Disease

## 2016-03-02 DIAGNOSIS — D869 Sarcoidosis, unspecified: Secondary | ICD-10-CM

## 2016-03-02 DIAGNOSIS — G4710 Hypersomnia, unspecified: Secondary | ICD-10-CM | POA: Insufficient documentation

## 2016-03-02 DIAGNOSIS — R0902 Hypoxemia: Secondary | ICD-10-CM

## 2016-03-02 NOTE — Patient Instructions (Signed)
  It was a pleasure taking care of you today!  We will schedule you to have a sleep study to determine if you have sleep apnea.   We will get a home sleep test.  You will be instructed to come back to the office to get an apparatus to sleep with overnight.  Once we have the apparatus, it will usually take us 1-2 weeks to read the study and get back at you with results of the test.  Please give us a call in 2 weeks after your study if you do not hear back from us.   If the sleep study is positive, we will order you a CPAP  machine.  Please call the office if you do NOT receive your machine in the next 1-2 weeks.   Please make sure you use your CPAP device everytime you sleep.  We will monitor the usage of your machine per your insurance requirement.  Your insurance company may take the machine from you if you are not using it regularly.   Please clean the mask, tubings, filter, water reservoir with soapy water every week.  Please use distilled water for the water reservoir.   Please call the office or your machine provider (DME company) if you are having issues with the device.   We will start you on Oxygen.  Return to clinic in 8-10 weeks with NP or dr. Christene Slatese Dios

## 2016-03-02 NOTE — Assessment & Plan Note (Signed)
O2 saturation was 87% on room air sitting on 02/18/2016. ABG 7.39, 59, 52 on RA on 03/01/2016.   O2 saturation on room air at rest was 77-80 percent. On 2 L, at rest, O2 saturation was 94%. On walking, he kept his O2 sats 94-95% on 2L.   Hypoxemia likely related to untreated obstructive sleep apnea. Plan to do a home sleep study or a lab sleep study, which ever can be done sooner. If he will be on CPAP or BiPAP, hopefully we can wean off oxygen. For the time being, as we're waiting for the sleep study, he needs to be on 2 L 24/7 at rest and with exertion.

## 2016-03-02 NOTE — Progress Notes (Signed)
Subjective:    Patient ID: Hunter [name], male    DOB: [birth year], 46 y.o.   MRN: [id number]  HPI   This is the case of Hunter [name], 46 y.o. Male, who was referred by Dr. Gardner Candle in consultation regarding abnormal PFT.   As you very well know, patient has a 10 PY smoking history, smokes 1/2 PPD, has not been diagnosed with asthma or copd. Has sinus allergies during summer.   He has CKD controlled with meds.  Not on HD.   He was diagnosed with Sarcoidosis in 2002. He had a skin biopsy which showed findings consistent with sarcoidosis. He was on prednisone  which was weaned off in 2007.   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 According to him, he does not have any issues with work. He gets winded with more than ADLs. This is chronic. Most likely related to his weight.  He is currently applying for a new job. As part of that application, he needed a PFT and pulmonary clearance.  He feels fine otherwise.  He denies snoring, gasping, choking, fatigue, hypersomnia. He states he feels rested when he wakes up in the morning. Denies hypersomnia.  PFT done on 02/12/2016: FEV1/FVC ratio was 71, FEV1 was 1.23 or 34% of predicted. FVC was 1.72 or 30% of predicted. TLC was 3.99 or 55% of predicted. Diffusion capacity was 62%.  Patient had a chest ct scan in October 2016 which showed prominent mediastinal lymphadenopathy.    ROV 03/02/16 Patient is here to discuss his results on ABG. ABG done on room air showed pH 7.39, PCO2 59, PO2 52 on room air. Since last seen, he states that he gets winded every now and then. Able to function well however. Has snoring, tiredness, fatigue. He wants to get his job he is applying for.   Review of Systems  Constitutional: Negative.  Negative for fever and unexpected weight change.  HENT: Negative.  Negative for congestion, dental problem, ear pain, nosebleeds, postnasal drip,  rhinorrhea, sinus pressure, sneezing, sore throat and trouble swallowing.   Eyes: Negative.  Negative for redness and itching.  Respiratory: Positive for shortness of breath. Negative for cough, chest tightness and wheezing.   Cardiovascular: Negative.  Negative for palpitations and leg swelling.  Gastrointestinal: Negative.  Negative for nausea and vomiting.  Endocrine: Negative.   Genitourinary: Negative.  Negative for dysuria.  Musculoskeletal: Negative.  Negative for joint swelling.  Skin: Negative.  Negative for rash.  Allergic/Immunologic: Positive for environmental allergies.  Neurological: Negative.  Negative for headaches.  Hematological: Negative.  Does not bruise/bleed easily.  Psychiatric/Behavioral: Negative.  Negative for dysphoric mood. The patient is not nervous/anxious.       Objective:   Physical Exam  Vitals:  Vitals:   03/02/16 1621  BP: (!) 132/98  Pulse: 93  SpO2: (!) 77%  Weight: (!) 302 lb 12.8 oz (137.3 kg)  Height: 6' (1.829 m)    Constitutional/General:  Pleasant, well-nourished, well-developed, not in any distress,  Comfortably seating.  Well kempt  Body mass index is 41.07 kg/m. Wt Readings from Last 3 Encounters:  03/02/16 (!) 302 lb 12.8 oz (137.3 kg)  02/18/16 299 lb 9.6 oz (135.9 kg)  05/05/15 (!) 307 lb (139.3 kg)     HEENT: Pupils equal and reactive to light and accommodation. Anicteric sclerae. Normal nasal mucosa.   No oral  lesions,  mouth clear,  oropharynx clear,  no postnasal drip. (-) Oral thrush. No dental caries.  Airway - Mallampati class III-IV  Neck: No masses. Midline trachea. No JVD, (-) LAD. (-) bruits appreciated.  Respiratory/Chest: Grossly normal chest. (-) deformity. (-) Accessory muscle use.  Symmetric expansion. (-) Tenderness on palpation.  Resonant on percussion.  Diminished BS on both lower lung zones. (-) wheezing, crackles, rhonchi (-) egophony  Cardiovascular: Regular rate and  rhythm, heart sounds  normal, no murmur or gallops, no peripheral edema  Gastrointestinal:  Normal bowel sounds. Soft, non-tender. No hepatosplenomegaly.  (-) masses.   Musculoskeletal:  Normal muscle tone. Normal gait.   Extremities: Grossly normal. (-) clubbing, cyanosis.  (-) edema  Skin: (-) rash,lesions seen.   Neurological/Psychiatric : alert, oriented to time, place, person. Normal mood and affect          Assessment & Plan:  Sarcoidosis (HCC) Pt  was diagnosed with Sarcoidosis in 2002. He had a skin biopsy which showed findings consistent with sarcoidosis. He was on prednisone  which was weaned off in 2007.   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 According to him, he does not have any issues with work. He gets winded with more than ADLs. This is chronic. Most likely related to his weight.  He is currently applying for a new job. As part of that application, he needed a PFT and pulmonary clearance.  He feels fine otherwise.  He has some snoring, fatigue.   PFT done on 02/12/2016: FEV1/FVC ratio was 71, FEV1 was 1.23 or 34% of predicted. FVC was 1.72 or 30% of predicted. TLC was 3.99 or 55% of predicted. Diffusion capacity was 62%.  Patient had a chest ct scan in October 2016 which showed prominent mediastinal lymphadenopathy.   CXR done on 02/18/2016 showed low lung volumes, cardiomegaly, small R effusion, atelectasis in R base.   ABG 02/2016 : 7/39, 59, 52 on RA.   Plan : 1. I extensively discussed with the patient findings in the pulmonary function tests. He has severe restriction related to sarcoidosis and morbid obesity. He claims he gets short of breath with more than ADLs. He claims he can do the job description as a Estate agent. He claims he can wear a respirator.  2. Patient has hypoxemia and hypercapnea likely related to untreated OSA and OHS. Will discuss in a separate problem.   Hypoxemia O2 saturation  was 87% on room air sitting on 02/18/2016. ABG 7.39, 59, 52 on RA on 03/01/2016.   O2 saturation on room air at rest was 77-80 percent. On 2 L, at rest, O2 saturation was 94%. On walking, he kept his O2 sats 94-95% on 2L.   Hypoxemia likely related to untreated obstructive sleep apnea. Plan to do a home sleep study or a lab sleep study, which ever can be done sooner. If he will be on CPAP or BiPAP, hopefully we can wean off oxygen. For the time being, as we're waiting for the sleep study, he needs to be on 2 L 24/7 at rest and with exertion.    Morbid obesity (HCC) Weight reduction  Hypersomnia Patient with hypersomnia, snoring, fatigue. Has morbid obesity. Has a crowded airway. Has obesity hypoventilation syndrome.  Ideally, we wanted to a split-night sleep study but the earliest we can do is in March 2018.  We will schedule pt  for home sleep study which can be done next week. Home study is to be  done on 2L o2. If home study is negative, we will proceed with a split-night sleep study.    Thank you very much for letting me participate in this patient's care. Please do not hesitate to give me a call if you have any questions or concerns regarding the treatment plan.   Patient will follow up with me in 8-10 weeks, sooner if needed.     Pollie MeyerJ. Angelo A. de Dios, MD 03/02/2016   5:22 PM Pulmonary and Critical Care Medicine [facility] Pager: [phone] Office: [phone], Fax: [phone]

## 2016-03-02 NOTE — Telephone Encounter (Signed)
   ABG result : 7.39, 59, 52. On RA.   I tried calling patient but was not able to get in touch with him. Left a voicemail for him to call back the office.  Jasmine : 1. He needs O2, at least 2L 24/7 as his pO2 is very low.  2.  He needs a walk test to determine if he needs more O2 with exertion.  3. Does he have insurance again ? If he does, he needs a split night study to determine if he has OSA or not.    Thanks.   Pollie MeyerJ. Angelo A de Dios, MD 03/02/2016, 9:59 AM [place] [facility] Pager [phone] After 3 pm or if no answer, call [phone]

## 2016-03-02 NOTE — Assessment & Plan Note (Signed)
Pt  was diagnosed with Sarcoidosis in 2002. He had a skin biopsy which showed findings consistent with sarcoidosis. He was on prednisone  which was weaned off in 2007.   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 According to him, he does not have any issues with work. He gets winded with more than ADLs. This is chronic. Most likely related to his weight.  He is currently applying for a new job. As part of that application, he needed a PFT and pulmonary clearance.  He feels fine otherwise.  He has some snoring, fatigue.   PFT done on 02/12/2016: FEV1/FVC ratio was 71, FEV1 was 1.23 or 34% of predicted. FVC was 1.72 or 30% of predicted. TLC was 3.99 or 55% of predicted. Diffusion capacity was 62%.  Patient had a chest ct scan in October 2016 which showed prominent mediastinal lymphadenopathy.   CXR done on 02/18/2016 showed low lung volumes, cardiomegaly, small R effusion, atelectasis in R base.   ABG 02/2016 : 7/39, 59, 52 on RA.   Plan : 1. I extensively discussed with the patient findings in the pulmonary function tests. He has severe restriction related to sarcoidosis and morbid obesity. He claims he gets short of breath with more than ADLs. 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 He claims he can wear a respirator.  2. Patient has hypoxemia and hypercapnea likely related to untreated OSA and OHS. Will discuss in a separate problem.

## 2016-03-02 NOTE — Assessment & Plan Note (Signed)
Patient with hypersomnia, snoring, fatigue. Has morbid obesity. Has a crowded airway. Has obesity hypoventilation syndrome.  Ideally, we wanted to a split-night sleep study but the earliest we can do is in March 2018.  We will schedule pt  for home sleep study which can be done next week. Home study is to be done on 2L o2. If home study is negative, we will proceed with a split-night sleep study.

## 2016-03-02 NOTE — Assessment & Plan Note (Signed)
Weight reduction 

## 2016-03-02 NOTE — Telephone Encounter (Addendum)
Spoke with pt. He was upset wanting more information, spoke with AD he requested the pt. Come in for an appointment today at the end of clinic. Spoke with pt. To see if he can come in today pt. Has agreed the appointment has been made. Nothing further at this time is needed

## 2016-03-03 ENCOUNTER — Telehealth: Payer: Self-pay | Admitting: Pulmonary Disease

## 2016-03-03 NOTE — Telephone Encounter (Signed)
Spoke with pt, aware of order change.  Nothing further needed.

## 2016-03-03 NOTE — Telephone Encounter (Signed)
Spoke with pt. He was confused because he received a phone call from [employer] and they stated they do not help pt.'s without insurance. I have spoken with the PCC's and they will look into this because they thought that they did. Will follow up with Cordelia PenSherry

## 2016-03-03 NOTE — Telephone Encounter (Signed)
I spoke to Fayrene FearingJames at [facility] & he stated they would be able to help the pt.  I have faxed O2 order to them.

## 2016-03-05 ENCOUNTER — Telehealth: Payer: Self-pay | Admitting: Pulmonary Disease

## 2016-03-05 NOTE — Telephone Encounter (Signed)
Called and spoke with pt and he is aware of AD recs.  Nothing further is needed.

## 2016-03-05 NOTE — Telephone Encounter (Signed)
Spoke with pt and  his question is:  Could his apartment  be causing his breathing trouble because he feels it is very dusty, small spots of mold on the windows, and his windows are bolted shut preventing him to get air in. He states AD talked about CO2 being a factor in his breathing. He feels this started happening when he moved there 2 years ago. Please advise.

## 2016-03-05 NOTE — Telephone Encounter (Signed)
Anything is possible. He can have his apartment checked for the molds.  His o2 and CO2 problem however are mostly related to untreated OSA.  That is the reason for the sleep study. Thanks.   Pollie MeyerJ. Angelo A de Dios, MD 03/05/2016, 4:39 PM [facility] Pager [phone] After 3 pm or if no answer, call [phone]

## 2016-03-31 ENCOUNTER — Telehealth: Payer: Self-pay | Admitting: Pulmonary Disease

## 2016-03-31 NOTE — Telephone Encounter (Signed)
I spoke with patient and he is not sure if this is the problem. He states that the 02 has helped and he is waiting to find another new Job before scheduling any other test

## 2016-03-31 NOTE — Telephone Encounter (Signed)
Anything I need to do from my end?   J. Alexis FrockAngelo A de Dios, MD 03/31/2016, 10:35 PM [facility] Pager [phone] After 3 pm or if no answer, call [phone]

## 2016-04-01 NOTE — Telephone Encounter (Signed)
AD   I will check into this and let you know

## 2016-04-07 ENCOUNTER — Ambulatory Visit: Payer: 59 | Admitting: Pulmonary Disease

## 2016-04-09 NOTE — Telephone Encounter (Signed)
Synetta Failnita was this message about pt not wanting his sleep study? What further testing did he not want?

## 2016-04-09 NOTE — Telephone Encounter (Signed)
I was just letting Dr. Clayborn BignessdeDios know why the HST had not been done yet

## 2016-04-27 ENCOUNTER — Ambulatory Visit: Payer: 59 | Admitting: Pulmonary Disease

## 2016-06-16 ENCOUNTER — Telehealth: Payer: Self-pay | Admitting: Pulmonary Disease

## 2016-06-16 NOTE — Telephone Encounter (Signed)
Error.Stanley A Dalton ° °

## 2016-07-01 ENCOUNTER — Encounter: Payer: Self-pay | Admitting: Pulmonary Disease

## 2016-07-01 ENCOUNTER — Ambulatory Visit (INDEPENDENT_AMBULATORY_CARE_PROVIDER_SITE_OTHER): Payer: BLUE CROSS/BLUE SHIELD | Admitting: Pulmonary Disease

## 2016-07-01 DIAGNOSIS — G4710 Hypersomnia, unspecified: Secondary | ICD-10-CM | POA: Diagnosis not present

## 2016-07-01 DIAGNOSIS — D869 Sarcoidosis, unspecified: Secondary | ICD-10-CM | POA: Diagnosis not present

## 2016-07-01 DIAGNOSIS — R0902 Hypoxemia: Secondary | ICD-10-CM

## 2016-07-01 NOTE — Assessment & Plan Note (Signed)
Weight reduction 

## 2016-07-01 NOTE — Assessment & Plan Note (Addendum)
Patient with hypersomnia, snoring, fatigue. Has morbid obesity. Has a crowded airway. Has obesity hypoventilation syndrome.  He started work, 12 hr shifts and he does 2 days on followed by 2 days off.  He remains tired and sleepy and naps all days during his days off.   Ideally wanted to get a lab study on him but he wanted to hold off. We'll schedule him for home sleep study.  Plan :   We discussed about the diagnosis of Obstructive Sleep Apnea (OSA) and implications of untreated OSA. We discussed about CPAP and BiPaP as possible treatment options.    We will schedule the patient for a sleep study. Plan for HST. If HST is (-), will need a lab sleep study. I wanted to get a lab study but he wanted to hold off.   Patient was instructed to call the office if he/she has not heard back from the office 1-2 weeks after the sleep study.   Patient was instructed to call the office if he/she is having issues with the PAP device.   We discussed good sleep hygiene.   Patient was advised not to engage in activities requiring concentration and/or vigilance if he/she is sleepy.  Patient was advised not to drive if he/she is sleepy.

## 2016-07-01 NOTE — Assessment & Plan Note (Addendum)
O2 saturation was 87% on room air sitting on 02/18/2016. ABG 7.39, 59, 52 on RA on 03/01/2016.   O2 saturation on room air at rest was 77-80 percent. (02/2016) On 2 L, at rest, O2 saturation was 94%. (02/2016) On walking, he kept his O2 sats 94-95% on 2L. (02/2016)  Hypoxemia likely related to untreated obstructive sleep apnea/OHS Plan for a HST.  He needs to be on 2 L 24/7 at rest and with exertion for the time being.  He will need a rpt walk test once on cpap.

## 2016-07-01 NOTE — Patient Instructions (Signed)
It was a pleasure taking care of you today!  We will schedule you to have a sleep study to determine if you have sleep apnea.   We will get a home sleep test.  You will be instructed to come back to the office to get an apparatus to sleep with overnight.  Once we have the apparatus, it will usually take us 1-2 weeks to read the study and get back at you with results of the test.  Please give us a call in 2 weeks after your study if you do not hear back from us.    If the sleep study is positive, we will order you a CPAP  machine.  Please call the office if you do NOT receive your machine in the next 1-2 weeks.   Please make sure you use your CPAP device everytime you sleep.  We will monitor the usage of your machine per your insurance requirement.  Your insurance company may take the machine from you if you are not using it regularly.   Please clean the mask, tubings, filter, water reservoir with soapy water every week.  Please use distilled water for the water reservoir.   Please call the office or your machine provider (DME company) if you are having issues with the device.   Return to clinic in 8-10 weeks with NP    

## 2016-07-01 NOTE — Assessment & Plan Note (Addendum)
Pt  was diagnosed with Sarcoidosis in 2002. He had a skin biopsy which showed findings consistent with sarcoidosis. He was on prednisone  which was weaned off in 2007.   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 According to him, he does not have any issues with work. He gets winded with more than ADLs. This is chronic. Most likely related to his weight. He recently started work as a Production designer, theatre/television/filmfork lift operator.   He needed PFT as a pre-employment requirement.  PFT done on 02/12/2016: FEV1/FVC ratio was 71, FEV1 was 1.23 or 34% of predicted. FVC was 1.72 or 30% of predicted. TLC was 3.99 or 55% of predicted. Diffusion capacity was 62%.  Patient had a chest ct scan in October 2016 which showed prominent mediastinal lymphadenopathy.   CXR done on 02/18/2016 showed low lung volumes, cardiomegaly, small R effusion, atelectasis in R base.   ABG 02/2016 : 7/39, 59, 52 on RA.   Plan : 1. I extensively discussed with the patient findings in the pulmonary function tests. He has severe restriction related to sarcoidosis and morbid obesity. He claims he gets short of breath with more than ADLs. 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 He claims he can wear a respirator.  Continue current work as long as tolerated.  I mentioned to him he needed O2.  2. Patient has hypoxemia and hypercapnea likely related to untreated OSA and OHS. Will discuss in a separate problem.

## 2016-07-01 NOTE — Progress Notes (Signed)
Subjective:    Patient ID: Hunter [name], male    DOB: 03-24-1970, 46 y.o.   MRN: [id number]  HPI   This is the case of Hunter [name], 46 y.o. Male, who was referred by Dr. Gardner CandleElizabeth Goldsborough in consultation regarding abnormal PFT.   As you very well know, patient has a 10 PY smoking history, smokes 1/2 PPD, has not been diagnosed with asthma or copd. Has sinus allergies during summer.   He has CKD controlled with meds.  Not on HD.   He was diagnosed with Sarcoidosis in 2002. He had a skin biopsy which showed findings consistent with sarcoidosis. He was on prednisone  which was weaned off in 2007.   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 According to him, he does not have any issues with work. He gets winded with more than ADLs. This is chronic. Most likely related to his weight.  He is currently applying for a new job. As part of that application, he needed a PFT and pulmonary clearance.  He feels fine otherwise.  He denies snoring, gasping, choking, fatigue, hypersomnia. He states he feels rested when he wakes up in the morning. Denies hypersomnia.  PFT done on 02/12/2016: FEV1/FVC ratio was 71, FEV1 was 1.23 or 34% of predicted. FVC was 1.72 or 30% of predicted. TLC was 3.99 or 55% of predicted. Diffusion capacity was 62%.  Patient had a chest ct scan in October 2016 which showed prominent mediastinal lymphadenopathy.    ROV 03/02/16 Patient is here to discuss his results on ABG. ABG done on room air showed pH 7.39, PCO2 59, PO2 52 on room air. Since last seen, he states that he gets winded every now and then. Able to function well however. Has snoring, tiredness, fatigue. He wants to get his job he is applying for.   ROV 07/01/2016 Patient is working as a Estate agentforklift operator. He claims he is using his O2. He does 12 hr shifts for 2 days then has 2 days off.  During days off, he ends up sleeping all day.  He  complains its hard for him to sleep during the days he is working. He has NOT done his sleep study.  He continues to follow with his kidney MD.   Review of Systems  Constitutional: Negative.  Negative for fever and unexpected weight change.  HENT: Negative.  Negative for congestion, dental problem, ear pain, nosebleeds, postnasal drip, rhinorrhea, sinus pressure, sneezing, sore throat and trouble swallowing.   Eyes: Negative.  Negative for redness and itching.  Respiratory: Positive for shortness of breath. Negative for cough, chest tightness and wheezing.   Cardiovascular: Negative.  Negative for palpitations and leg swelling.  Gastrointestinal: Negative.  Negative for nausea and vomiting.  Endocrine: Negative.   Genitourinary: Negative.  Negative for dysuria.  Musculoskeletal: Negative.  Negative for joint swelling.  Skin: Negative.  Negative for rash.  Allergic/Immunologic: Positive for environmental allergies.  Neurological: Negative.  Negative for headaches.  Hematological: Negative.  Does not bruise/bleed easily.  Psychiatric/Behavioral: Negative.  Negative for dysphoric mood. The patient is not nervous/anxious.       Objective:   Physical Exam  Vitals:  Vitals:   07/01/16 1208 07/01/16 1212  BP:  (!) 144/100  Pulse:  82  SpO2:  (!) 87%  Weight:  300 lb (136.1 kg)  Height: 6' (1.829 m) 6' (1.829 m)    Constitutional/General:  Pleasant, well-nourished,  well-developed, not in any distress,  Comfortably seating.  Well kempt  Body mass index is 40.69 kg/m. Wt Readings from Last 3 Encounters:  07/01/16 300 lb (136.1 kg)  03/02/16 (!) 302 lb 12.8 oz (137.3 kg)  02/18/16 299 lb 9.6 oz (135.9 kg)     HEENT: Pupils equal and reactive to light and accommodation. Anicteric sclerae. Normal nasal mucosa.   No oral  lesions,  mouth clear,  oropharynx clear, no postnasal drip. (-) Oral thrush. No dental caries.  Airway - Mallampati class III-IV  Neck: No masses. Midline  trachea. No JVD, (-) LAD. (-) bruits appreciated.  Respiratory/Chest: Grossly normal chest. (-) deformity. (-) Accessory muscle use.  Symmetric expansion. (-) Tenderness on palpation.  Resonant on percussion.  Diminished BS on both lower lung zones. (-) wheezing, crackles, rhonchi (-) egophony  Cardiovascular: Regular rate and  rhythm, heart sounds normal, no murmur or gallops, Gr 2  edema  Gastrointestinal:  Normal bowel sounds. Soft, non-tender. No hepatosplenomegaly.  (-) masses.   Musculoskeletal:  Normal muscle tone. Normal gait.   Extremities: Grossly normal. (-) clubbing, cyanosis.  Gr 2  edema  Skin: (-) rash,lesions seen.   Neurological/Psychiatric : alert, oriented to time, place, person. Normal mood and affect          Assessment & Plan:  Hypersomnia Patient with hypersomnia, snoring, fatigue. Has morbid obesity. Has a crowded airway. Has obesity hypoventilation syndrome.  He started work, 12 hr shifts and he does 2 days on followed by 2 days off.  He remains tired and sleepy and naps all days during his days off.   Ideally wanted to get a lab study on him but he wanted to hold off. We'll schedule him for home sleep study.  Plan :   We discussed about the diagnosis of Obstructive Sleep Apnea (OSA) and implications of untreated OSA. We discussed about CPAP and BiPaP as possible treatment options.    We will schedule the patient for a sleep study. Plan for HST. If HST is (-), will need a lab sleep study. I wanted to get a lab study but he wanted to hold off.   Patient was instructed to call the office if he/she has not heard back from the office 1-2 weeks after the sleep study.   Patient was instructed to call the office if he/she is having issues with the PAP device.   We discussed good sleep hygiene.   Patient was advised not to engage in activities requiring concentration and/or vigilance if he/she is sleepy.  Patient was advised not to drive if he/she  is sleepy.    Hypoxemia O2 saturation was 87% on room air sitting on 02/18/2016. ABG 7.39, 59, 52 on RA on 03/01/2016.   O2 saturation on room air at rest was 77-80 percent. (02/2016) On 2 L, at rest, O2 saturation was 94%. (02/2016) On walking, he kept his O2 sats 94-95% on 2L. (02/2016)  Hypoxemia likely related to untreated obstructive sleep apnea/OHS Plan for a HST.  He needs to be on 2 L 24/7 at rest and with exertion for the time being.  He will need a rpt walk test once on cpap.    Morbid obesity (HCC) Weight reduction  Sarcoidosis (HCC) Pt  was diagnosed with Sarcoidosis in 2002. He had a skin biopsy which showed findings consistent with sarcoidosis. He was on prednisone  which was weaned off in 2007.   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 According to him, he does not have any issues with work. He gets winded with more than ADLs. This is chronic. Most likely related to his weight. He recently started work as a Production designer, theatre/television/film.   He needed PFT as a pre-employment requirement.  PFT done on 02/12/2016: FEV1/FVC ratio was 71, FEV1 was 1.23 or 34% of predicted. FVC was 1.72 or 30% of predicted. TLC was 3.99 or 55% of predicted. Diffusion capacity was 62%.  Patient had a chest ct scan in October 2016 which showed prominent mediastinal lymphadenopathy.   CXR done on 02/18/2016 showed low lung volumes, cardiomegaly, small R effusion, atelectasis in R base.   ABG 02/2016 : 7/39, 59, 52 on RA.   Plan : 1. I extensively discussed with the patient findings in the pulmonary function tests. He has severe restriction related to sarcoidosis and morbid obesity. He claims he gets short of breath with more than ADLs. He claims he can do the job description as a Estate agent. He claims he can wear a respirator.  Continue current work as long as tolerated.  I mentioned to him he needed O2.  2. Patient has hypoxemia and  hypercapnea likely related to untreated OSA and OHS. Will discuss in a separate problem.     F/u in 8 - 10 weeks.   Pollie Meyer, MD 07/01/2016   1:17 PM Pulmonary and Critical Care Medicine [facility] Pager: [phone] Office: [phone], Fax: [phone]

## 2016-08-02 ENCOUNTER — Encounter: Payer: Self-pay | Admitting: Physician Assistant

## 2016-08-02 ENCOUNTER — Ambulatory Visit (INDEPENDENT_AMBULATORY_CARE_PROVIDER_SITE_OTHER): Payer: Self-pay | Admitting: Physician Assistant

## 2016-08-02 ENCOUNTER — Telehealth: Payer: Self-pay | Admitting: Pulmonary Disease

## 2016-08-02 VITALS — BP 159/104 | HR 83 | Temp 98.4°F | Resp 18 | Ht 72.0 in | Wt 283.2 lb

## 2016-08-02 DIAGNOSIS — H5789 Other specified disorders of eye and adnexa: Secondary | ICD-10-CM

## 2016-08-02 DIAGNOSIS — H578 Other specified disorders of eye and adnexa: Secondary | ICD-10-CM

## 2016-08-02 NOTE — Telephone Encounter (Signed)
Mellody DanceKeith spoke with patient - he advised patient to go to primary care downstairs to have his eye evaluated as this is not typically an issue with oxygen.  Pt was advised that if primary care does not have an appt today then he should proceed to an urgent care.  Patient asked that I escort him.  Went with patient to primary care - they do not accept walk-ins for unestablished patients with [facility].  Asked if they have any openings today for new patients - they [facility] at [facility] until the end of the month but the [facility] and [facility] locations should have sooner appts.  Advised patient that he should follow Keith's recommendations and proceed to an urgent care to evaluate his eye.  Pt voiced his understanding and left.  Nothing further needed; will sign off.

## 2016-08-02 NOTE — Patient Instructions (Addendum)
zaditor or naphcon -- use this 2x/day to help with inflammation  Nighttime ointment to protect the eye from dry air    IF you received an x-ray today, you will receive an invoice from [facility]. Please contact [facility] at [phone] with questions or concerns regarding your invoice.   IF you received labwork today, you will receive an invoice from [facility]. Please contact [facility] at [phone] with questions or concerns regarding your invoice.   Our billing staff will not be able to assist you with questions regarding bills from these companies.  You will be contacted with the lab results as soon as they are available. The fastest way to get your results is to activate your My Chart account. Instructions are located on the last page of this paperwork. If you have not heard from us regarding the results in 2 weeks, please contact this office.

## 2016-08-02 NOTE — Progress Notes (Signed)
   Hunter GasserRobert [name]  MRN: [id number] DOB: [birth year]  PCP: Hunter SableGoldsborough, Kellie, MD  Chief Complaint  Patient presents with  . Eye Pain    right eye feels like something is in it     Subjective:  Pt presents to clinic for right eye pain and feels like something is in his eye.  2 days ago felt like he got something in his eye.  He has washed his eye with water but that has not helped the pain in his eye.  No vision problems. A lot of tearing some thicker drainage when he wakes in the am.  No recent colds.  He has know HTN and hypoexmia but he does not always take his medications and he does not use his Oxygen except while at home  Review of Systems  Eyes: Positive for discharge (tearing) and redness (mild). Negative for photophobia and visual disturbance.    Patient Active Problem List   Diagnosis Date Noted  . Hypersomnia 03/02/2016  . Sarcoidosis 02/18/2016  . Hypoxemia 02/18/2016  . Morbid obesity (HCC) 02/18/2016    Current Outpatient Prescriptions on File Prior to Visit  Medication Sig Dispense Refill  . atorvastatin (LIPITOR) 20 MG tablet Take 20 mg by mouth daily.  2  . furosemide (LASIX) 40 MG tablet Take 40 mg by mouth daily.  0  . lisinopril (PRINIVIL,ZESTRIL) 20 MG tablet Take 20 mg by mouth daily.  6   No current facility-administered medications on file prior to visit.     Allergies  Allergen Reactions  . Thalidomide Other (See Comments)    Derm,resp    Pt patients past, family and social history were reviewed and updated.   Objective:  BP (!) 159/104   Pulse 83   Temp 98.4 F (36.9 C) (Oral)   Resp 18   Ht 6' (1.829 m)   Wt 283 lb 3.2 oz (128.5 kg)   SpO2 92%   BMI 38.41 kg/m   Physical Exam  Constitutional: He is oriented to person, place, and time and well-developed, well-nourished, and in no distress.  HENT:  Head: Normocephalic and atraumatic.  Right Ear: External ear normal.  Left Ear: External ear normal.  Eyes: EOM are normal. Pupils are  equal, round, and reactive to light. No foreign body present in the right eye. Right conjunctiva is injected (mild). Right conjunctiva has no hemorrhage. Left conjunctiva is not injected. Left conjunctiva has no hemorrhage.  Fluoro to eye - no uptake  Neck: Normal range of motion.  Pulmonary/Chest: Effort normal.  Neurological: He is alert and oriented to person, place, and time. Gait normal.  Skin: Skin is warm and dry.  Psychiatric: Mood, memory, affect and judgment normal.     Visual Acuity Screening   Right eye Left eye Both eyes  Without correction: 20/25-2 20/15-2 20/15-2  With correction:       Assessment and Plan :  Redness of right eye - other than injection no FB or corneal involvement seen on exam - likely allergic though is possible something to eye and now is out and healing - keep eye lubricated and f/u if problems - encouraged to use O2 as that will help with the health of his eye and healing.  Benny LennertSarah Weber PA-C  Primary [facility] at [facility] 08/06/2016 8:46 PM

## 2016-08-11 ENCOUNTER — Ambulatory Visit (INDEPENDENT_AMBULATORY_CARE_PROVIDER_SITE_OTHER): Payer: Self-pay

## 2016-08-11 ENCOUNTER — Ambulatory Visit (HOSPITAL_COMMUNITY)
Admission: EM | Admit: 2016-08-11 | Discharge: 2016-08-11 | Disposition: A | Discharge status: Home / Self Care | Payer: Self-pay | Attending: Family Medicine | Admitting: Family Medicine

## 2016-08-11 ENCOUNTER — Encounter (HOSPITAL_COMMUNITY): Payer: Self-pay | Admitting: Emergency Medicine

## 2016-08-11 DIAGNOSIS — H10021 Other mucopurulent conjunctivitis, right eye: Secondary | ICD-10-CM

## 2016-08-11 DIAGNOSIS — M79672 Pain in left foot: Secondary | ICD-10-CM

## 2016-08-11 DIAGNOSIS — M25462 Effusion, left knee: Secondary | ICD-10-CM

## 2016-08-11 DIAGNOSIS — H1031 Unspecified acute conjunctivitis, right eye: Secondary | ICD-10-CM

## 2016-08-11 DIAGNOSIS — S99921A Unspecified injury of right foot, initial encounter: Secondary | ICD-10-CM

## 2016-08-11 DIAGNOSIS — S66911A Strain of unspecified muscle, fascia and tendon at wrist and hand level, right hand, initial encounter: Secondary | ICD-10-CM

## 2016-08-11 DIAGNOSIS — M7052 Other bursitis of knee, left knee: Secondary | ICD-10-CM

## 2016-08-11 DIAGNOSIS — M778 Other enthesopathies, not elsewhere classified: Secondary | ICD-10-CM

## 2016-08-11 DIAGNOSIS — M779 Enthesopathy, unspecified: Secondary | ICD-10-CM

## 2016-08-11 DIAGNOSIS — S0501XA Injury of conjunctiva and corneal abrasion without foreign body, right eye, initial encounter: Secondary | ICD-10-CM

## 2016-08-11 DIAGNOSIS — M25562 Pain in left knee: Secondary | ICD-10-CM

## 2016-08-11 DIAGNOSIS — W19XXXA Unspecified fall, initial encounter: Secondary | ICD-10-CM

## 2016-08-11 DIAGNOSIS — M79671 Pain in right foot: Secondary | ICD-10-CM

## 2016-08-11 MED ORDER — IBUPROFEN 800 MG PO TABS: 800.0000 mg | ORAL_TABLET | Freq: Three times a day (TID) | ORAL | 0 refills | Status: DC

## 2016-08-11 MED ORDER — KETOROLAC TROMETHAMINE 60 MG/2ML IM SOLN
INTRAMUSCULAR | Status: AC
  Filled 2016-08-11: qty 2

## 2016-08-11 MED ORDER — HYDROCODONE-ACETAMINOPHEN 5-325 MG PO TABS: 2.0000 | ORAL_TABLET | ORAL | 0 refills | Status: DC | PRN

## 2016-08-11 MED ORDER — KETOROLAC TROMETHAMINE 60 MG/2ML IM SOLN
60.0000 mg | Freq: Once | INTRAMUSCULAR | Status: AC
  Administered 2016-08-11: 60 mg via INTRAMUSCULAR

## 2016-08-11 MED ORDER — FLUORESCEIN SODIUM 0.6 MG OP STRP
ORAL_STRIP | OPHTHALMIC | Status: AC
Start: 2016-08-11 — End: 2016-08-11
  Filled 2016-08-11: qty 1

## 2016-08-11 MED ORDER — POLYMYXIN B-TRIMETHOPRIM 10000-0.1 UNIT/ML-% OP SOLN: 1.0000 [drp] | OPHTHALMIC | 0 refills | Status: DC

## 2016-08-11 NOTE — ED Notes (Signed)
16  Inch  Knee   immoblizer     Applied  To  l  Knee          xl  Post  Op  Boot        To   Each  Foot      rx   And  Work     Note

## 2016-08-11 NOTE — ED Triage Notes (Signed)
The [facility] with a complaint of right ankle pain, left leg pain and left foot pain secondary to a fall that occurred yesterday.

## 2016-08-11 NOTE — ED Notes (Signed)
Escorted patient to treatment room by wheelchair.  Patient requesting pain medicine.  Notified bill oxford, np of patient's arrival and request

## 2016-08-11 NOTE — ED Provider Notes (Addendum)
CSN: [id number]     Arrival date & time 08/11/16  1129 History   First MD Initiated Contact with Patient 08/11/16 1227     Chief Complaint  Patient presents with  . Fall   (Consider location/radiation/quality/duration/timing/severity/associated sxs/prior Treatment) Patient presents to [facility] with c/o fall yesterday.  He states something flew into his left eye and he slipped and fell down the stairs and injured his left knee, left foot and right foot.  He is in a lot of pain and is requesting pain medicine.  He is having pain when walking.  He also c/o left hand pain for a day that he noticed when he tries to sit or stand.  He has requested pain medicine out in the waiting room.     The history is provided by the patient.  Fall  This is a new problem. The current episode started yesterday. The problem occurs constantly. The problem has not changed since onset.Nothing aggravates the symptoms.    Past Medical History:  Diagnosis Date  . Hypertension   . Sarcoid    History reviewed. No pertinent surgical history. History reviewed. No pertinent family history. Social History  Substance Use Topics  . Smoking status: Current Some Day Smoker    Packs/day: 0.50    Years: 10.00    Types: Cigarettes  . Smokeless tobacco: Never Used  . Alcohol use 1.8 oz/week    3 Cans of beer per week    Review of Systems  Constitutional: Negative.   HENT: Negative.   Eyes: Positive for pain.  Respiratory: Negative.   Cardiovascular: Negative.   Gastrointestinal: Negative.   Endocrine: Negative.   Genitourinary: Negative.   Musculoskeletal: Positive for arthralgias.  Skin: Negative.   Allergic/Immunologic: Negative.   Neurological: Negative.   Hematological: Negative.   Psychiatric/Behavioral: Negative.     Allergies  Thalidomide  Home Medications   Prior to Admission medications   Medication Sig Start Date End Date Taking? Authorizing Provider  atorvastatin (LIPITOR) 20 MG tablet Take 20  mg by mouth daily. 03/06/15   [provider]  furosemide (LASIX) 40 MG tablet Take 40 mg by mouth daily. 03/06/15   [provider]  HYDROcodone-acetaminophen (NORCO/VICODIN) 5-325 MG tablet Take 2 tablets by mouth every 4 (four) hours as needed. 08/11/16   Deatra Canter, FNP  ibuprofen (ADVIL,MOTRIN) 800 MG tablet Take 1 tablet (800 mg total) by mouth 3 (three) times daily. 08/11/16   Deatra Canter, FNP  lisinopril (PRINIVIL,ZESTRIL) 20 MG tablet Take 20 mg by mouth daily. 03/06/15   [provider]  trimethoprim-polymyxin b (POLYTRIM) ophthalmic solution Place 1 drop into the right eye every 4 (four) hours. 08/11/16   Deatra Canter, FNP   Meds Ordered and Administered this Visit   Medications  ketorolac (TORADOL) injection 60 mg (not administered)    BP (!) 150/97 (BP Location: Right Arm)   Pulse 80   Temp 98.7 F (37.1 C) (Oral)   Resp 18   SpO2 98%  No data found.   Physical Exam  Constitutional: He appears well-developed and well-nourished.  HENT:  Head: Normocephalic and atraumatic.  Eyes: Pupils are equal, round, and reactive to light.  Right eye conjunctiva injected.  Tetracaine gtt's applied and then flourescein applied and small corneal abrasion noted.  Neck: Normal range of motion. Neck supple.  Cardiovascular: Normal rate, regular rhythm and normal heart sounds.   Pulmonary/Chest: Effort normal and breath sounds normal.  Abdominal: Soft. Bowel sounds are normal.  Musculoskeletal: He exhibits tenderness.  Left knee with TTP and patellar effusion noted.  TTP left heel.  TTP right lateral malleolus and right foot.  Nursing note and vitals reviewed.   Urgent Care Course     Procedures (including critical care time)  Labs Review Labs Reviewed - No data to display  Imaging Review No results found.   Visual Acuity Review  Right Eye Distance:   Left Eye Distance:   Bilateral Distance:    Right Eye Near:   Left Eye Near:     Bilateral Near:         MDM   1. Fall, initial encounter   2. Left hand tendonitis   3. Acute conjunctivitis of right eye, unspecified acute conjunctivitis type   4. Injury of right foot, initial encounter   5. Left foot pain   6. Acute pain of left knee    Explained to patient that his xrays show no fractures.   Explained he has contusion to left knee and effusion. Advised to apply ice to left knee.  He states his feet and ankles hurt bad.  Explained he can apply ice and rest his lower extremities.  He states hurts to walk and feels unsteady in feet and ankles and left knee.  Advised him we can use a knee imobolizer and he wants to wear ortho device on both feet.  Explain that we can ace wrap ankles and he can wear shoes.  He doesn't want to wear shoes and requests bilateral ortho DME for his feet.  He requests ortho boots/ shoes or post op shoes.  Explained this will be awkward to walk in and would do better with ACE wraps of ankles. Patient states he feels like something in his left knee is wrong and wants to follow up with Ortho.  He is asking if we do primary care here and I advised him if he wants to change his primary care he can go to [facility].  He is asking for the notes and I advise him to call Medical Records and I explained we are giving him AVS sheet today.   Motrin 800mg  one po tid #21 Norco 5/325 one po 6 hours prn #12 Bilateral Ortho shoes-  Left knee imobolizer  Follow with Dr. [facility].  Spent over 45 minutes with patient in visit.     Deatra CanterOxford, William J, FNP 08/11/16 1409    Deatra Canterxford, William J, FNP 08/11/16 [phone]

## 2016-08-11 NOTE — ED Notes (Signed)
Patient transported to X-ray.  Remains in xray

## 2016-08-11 NOTE — ED Notes (Signed)
Patient transported to X-ray 

## 2016-08-11 NOTE — Discharge Instructions (Signed)
Apply Ice to left knee.  Wear knee imobolizer.  Call Dr. [facility] office for follow up visit.  Take Ibuprofen consistently.  Take pain medicine as necessary.

## 2016-08-24 ENCOUNTER — Telehealth: Payer: Self-pay | Admitting: Pulmonary Disease

## 2016-08-24 DIAGNOSIS — R0902 Hypoxemia: Secondary | ICD-10-CM

## 2016-08-24 NOTE — Telephone Encounter (Signed)
Will place the order

## 2016-08-24 NOTE — Telephone Encounter (Signed)
Pt is a former [employer] pt. He wants to have his oxygen tanks picked up by [facility] by Thursday. He stated that he is still using his POC, but wants the extra tanks gone. He is moving Thursday and does not want to keep them in a storage building.   He had an appt with SG on 7/19 but cancelled it.   PM, is ok for us to send in a order for [facility] to pick up the oxygen tanks? Please advise. Thanks.

## 2016-08-24 NOTE — Telephone Encounter (Signed)
Ok to pick up the extra oxygen tanks as long he does not stop the O2 altogether. As per Dr Clayborn Bignessedios note he needs to be on 2 L 24/7 at rest and with exertion. Also make follow up to be seen in clinic.   Hunter GreathousePraveen Fonnie Crookshanks MD [facility] 08/24/2016, 5:20 PM

## 2016-08-25 ENCOUNTER — Telehealth: Payer: Self-pay | Admitting: Pulmonary Disease

## 2016-08-25 NOTE — Telephone Encounter (Signed)
Called [facility] because the order was placed yesterday and spoke with the Resp. Therapist and she made note of this and she states she will inform the tech so that they can go out today. Spoke to patient and he is aware that they will come out today. He will call us back 4pm if no one comes out so that we can call the office back before they close to figure out where they are in picking up his extra tanks. Pt had no additional questions at this time. Nothing further is needed

## 2016-08-26 ENCOUNTER — Ambulatory Visit: Payer: BLUE CROSS/BLUE SHIELD | Admitting: Acute Care

## 2016-09-27 ENCOUNTER — Encounter: Payer: Self-pay | Admitting: Pediatric Intensive Care

## 2016-10-01 ENCOUNTER — Encounter: Payer: Self-pay | Admitting: Neurology

## 2016-10-15 NOTE — Congregational Nurse Program (Signed)
Congregational Nurse Program Note  Date of Encounter: 09/27/2016  Past Medical History: Past Medical History:  Diagnosis Date  . Hypertension   . Sarcoid     Encounter Details:     CNP Questionnaire - 09/27/16 0930      Patient Demographics   Is this a new or existing patient? New   Patient is considered a/an Not Applicable   Race African-American/Black     Patient Assistance   Location of Patient Assistance [facility]   Patient's financial/insurance status Self-Pay (Uninsured)   Uninsured Patient ([employer] officer, trade unionCard/Care Connects) Yes   Interventions Not Applicable   Patient referred to apply for the following financial assistance [facility] [employer] Card/Care Connects;[facility]   Food insecurities addressed Not Applicable   Transportation assistance No   Assistance securing medications No   Educational health offerings Navigating the healthcare system     Encounter Details   Primary purpose of visit Navigating the Healthcare System   Was an Emergency Department visit averted? Not Applicable   Does patient have a medical provider? Yes   Patient referred to Other   Was a mental health screening completed? (GAINS tool) No   Does patient have dental issues? No   Does patient have vision issues? No   Does your patient have an abnormal blood pressure today? No   Since previous encounter, have you referred patient for abnormal blood pressure that resulted in a new diagnosis or medication change? No   Does your patient have an abnormal blood glucose today? No   Since previous encounter, have you referred patient for abnormal blood glucose that resulted in a new diagnosis or medication change? No   Was there a life-saving intervention made? No    New client- states that he is working but lost his health insurance. Requests information on bridging medical care as he has history of sarcoidosis, sleep apnea. Client states he is supposed to be on 2LPM [place] O2 and has supplies but doesn't wear it as  he is uncomfortable in this environment. CN will assist client in transferring PCP care.

## 2016-10-26 ENCOUNTER — Ambulatory Visit (INDEPENDENT_AMBULATORY_CARE_PROVIDER_SITE_OTHER): Payer: Self-pay | Admitting: Family Medicine

## 2016-10-26 ENCOUNTER — Encounter: Payer: Self-pay | Admitting: Family Medicine

## 2016-10-26 VITALS — BP 122/78 | HR 85 | Temp 97.9°F | Resp 12 | Ht 72.0 in | Wt 295.0 lb

## 2016-10-26 DIAGNOSIS — I10 Essential (primary) hypertension: Secondary | ICD-10-CM

## 2016-10-26 DIAGNOSIS — D869 Sarcoidosis, unspecified: Secondary | ICD-10-CM

## 2016-10-26 DIAGNOSIS — R059 Cough, unspecified: Secondary | ICD-10-CM

## 2016-10-26 DIAGNOSIS — R05 Cough: Secondary | ICD-10-CM

## 2016-10-26 LAB — POCT URINALYSIS DIP (DEVICE)
Glucose, UA: NEGATIVE mg/dL
Ketones, ur: NEGATIVE mg/dL
Leukocytes, UA: NEGATIVE
Nitrite: NEGATIVE
Protein, ur: >=300 mg/dL — AB
SPECIFIC GRAVITY, URINE: 1.025 (ref 1.005–1.030)
Urobilinogen, UA: 1 mg/dL (ref 0.0–1.0)
pH: 5.5 (ref 5.0–8.0)

## 2016-10-26 MED ORDER — HYDROXYZINE PAMOATE 100 MG PO CAPS: 100.0000 mg | ORAL_CAPSULE | Freq: Every day | ORAL | 0 refills | Status: DC

## 2016-10-26 MED ORDER — ATORVASTATIN CALCIUM 20 MG PO TABS: 20.0000 mg | ORAL_TABLET | Freq: Every day | ORAL | 2 refills | Status: DC

## 2016-10-26 MED ORDER — FUROSEMIDE 40 MG PO TABS: 40.0000 mg | ORAL_TABLET | Freq: Every day | ORAL | 0 refills | Status: DC

## 2016-10-26 MED FILL — FUROSEMIDE 40 MG TABLET: 40 | 30 days supply | Qty: 30 | Fill #0

## 2016-10-26 MED FILL — ?ATORVASTATIN 20 MG TABLET: 20 | 30 days supply | Qty: 30 | Fill #0

## 2016-10-26 MED FILL — HYDROXYZINE PAM 100 MG CAP: 100 | 30 days supply | Qty: 30 | Fill #0 | Status: TO

## 2016-10-26 NOTE — Progress Notes (Signed)
Patient ID: Hunter [name], male    DOB: Jun 12, 1970, 45 y.o.   MRN: [id number]  PCP: Hunter Neighbors, FNP  Chief Complaint  Patient presents with  . Establish Care    Subjective:  HPI Hunter [name] is a 46 y.o. male presents to establish care. Medical history significant for sarcoidosis, hypoxemia, hypertension, home oxygen dependent.  Hunter [name] has had no recent PCP. Reports that lately he is experiencing persistent insomnia as he recently moved into a homeless shelter. Insomnia is not a new problem although he reports now experiencing sleepless nights, nightly due to his environment. [name] reports chronic dyspnea which is worsened with activity and only mildly relieved with rest. He denies having sarcoidosis and reports this was a misdiagnosis. He reports being out of medications for sometime and needs refills on all medication. Hunter [name] reports having renal failure and it is uncertain of stage of CKD. 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 Hunter [name] recommended that patient where continuous supplemental oxygen @ 2 liter and had previously ordered split sleep studies as patient likely suffers from OSA.Marland Kitchen  Last PFT Completed 02/12/2016:  FEV1/FVC ratio was 71, FEV1 was 1.23 or 34% of predicted. FVC was 1.72 or 30% of predicted. TLC was 3.99 or 55% of predicted. Diffusion capacity was 62%.  At present, he is uninsured, has no scheduled follow-Hunter appointment with [name].  Social History   Social History  . Marital status: Single    Spouse name: N/A  . Number of children: N/A  . Years of education: N/A   Occupational History  . Not on file.   Social History Main Topics  . Smoking status: Current Some Day Smoker    Packs/day: 0.50    Years: 10.00    Types: Cigarettes  . Smokeless tobacco: Never Used  . Alcohol use 1.8 oz/week    3 Cans of beer per week  . Drug use: Unknown  . Sexual activity:  Not on file   Other Topics Concern  . Not on file   Social History Narrative  . No narrative on file   No family history on file. Review of Systems  See HPI   Patient Active Problem List   Diagnosis Date Noted  . Hypersomnia 03/02/2016  . Sarcoidosis 02/18/2016  . Hypoxemia 02/18/2016  . Morbid obesity (HCC) 02/18/2016    Allergies  Allergen Reactions  . Thalidomide Other (See Comments)    Derm,resp    Prior to Admission medications   Medication Sig Start Date End Date Taking? Authorizing Provider  atorvastatin (LIPITOR) 20 MG tablet Take 20 mg by mouth daily. 03/06/15  Yes [provider]  furosemide (LASIX) 40 MG tablet Take 40 mg by mouth daily. 03/06/15  Yes [provider]  lisinopril (PRINIVIL,ZESTRIL) 20 MG tablet Take 20 mg by mouth daily. 03/06/15  Yes [provider]  HYDROcodone-acetaminophen (NORCO/VICODIN) 5-325 MG tablet Take 2 tablets by mouth every 4 (four) hours as needed. Patient not taking: Reported on 10/26/2016 08/11/16   Hunter Canter, FNP  ibuprofen (ADVIL,MOTRIN) 800 MG tablet Take 1 tablet (800 mg total) by mouth 3 (three) times daily. Patient not taking: Reported on 10/26/2016 08/11/16   Hunter Canter, FNP  trimethoprim-polymyxin b (POLYTRIM) ophthalmic solution Place 1 drop into the right eye every 4 (four) hours. Patient not taking: Reported on 10/26/2016 08/11/16   Hunter Canter, FNP    Past Medical, Surgical Family and  Social History reviewed and updated.    Objective:   Today's Vitals   10/26/16 1036  BP: 122/78  Pulse: 85  Resp: 12  Temp: 97.9 F (36.6 C)  TempSrc: Oral  SpO2: 92%  Weight: 295 lb (133.8 kg)  Height: 6' (1.829 m)    Wt Readings from Last 3 Encounters:  10/26/16 295 lb (133.8 kg)  08/02/16 283 lb 3.2 oz (128.5 kg)  07/01/16 300 lb (136.1 kg)   Physical Exam  Constitutional: He appears well-nourished.  HENT:  Head: Atraumatic.  Neck: Normal range of motion. Neck supple. No  thyromegaly present.  Cardiovascular: Normal rate and regular rhythm.   Pulmonary/Chest: He has decreased breath sounds in the right upper field, the right middle field, the right lower field, the left upper field and the left middle field. He has no wheezes. He has no rhonchi. He has no rales.  Shortness of breath noted on exam.   Abdominal: Soft. Bowel sounds are normal. He exhibits no distension. There is no tenderness. There is no rebound.  Musculoskeletal: Normal range of motion.  Lymphadenopathy:    He has no cervical adenopathy.  Neurological: He is alert.  Skin: Skin is warm and dry.  Psychiatric: His speech is normal. Judgment and thought content normal. His mood appears anxious. He is hyperactive. Cognition and memory are normal.   Assessment & Plan:  1. Sarcoidosis, continue home oxygen.  Complete [name] Patient Assistance Application , as you will need to reestablish with [name] and need a sleep study to further evaluate the underlying cause of persistent shortness of breath. Continue furosemide as prescribed.  2. Cough - Brain natriuretic peptide 3. Essential hypertension, controlled today, no changes in therapy. Will resume lisinopril after baseline renal function is obtained.  Orders Placed This Encounter  Procedures  . Brain natriuretic peptide  . TIQ-NTM  . POCT urinalysis dip (device)    Meds ordered this encounter  Medications  . hydrOXYzine (VISTARIL) 100 MG capsule    Sig: Take 1 capsule (100 mg total) by mouth daily.    Dispense:  60 capsule    Refill:  0    Order Specific Question:   Supervising Provider    Answer:   Hunter [name] [id number]  . furosemide (LASIX) 40 MG tablet    Sig: Take 1 tablet (40 mg total) by mouth daily.    Dispense:  30 tablet    Refill:  0    Order Specific Question:   Supervising Provider    Answer:   Hunter [name] [id number]  . atorvastatin (LIPITOR) 20 MG tablet    Sig: Take 1 tablet (20 mg total) by  mouth daily.    Dispense:  90 tablet    Refill:  2    Order Specific Question:   Supervising Provider    Answer:   Hunter [name] [id number]     RTC: 1 months for chronic disease management. Return sooner if symptoms worsen or do no improve.  Hunter Pick. Tiburcio Pea, MSN, FNP-C [facility]  [address] [place] [place], [place] [address] [phone]

## 2016-10-28 ENCOUNTER — Telehealth: Payer: Self-pay | Admitting: Family Medicine

## 2016-10-28 ENCOUNTER — Encounter: Payer: Self-pay | Admitting: Family Medicine

## 2016-10-28 LAB — TIQ-NTM

## 2016-10-28 NOTE — Telephone Encounter (Signed)
Please inquire as to why BNP was cancelled by [facility].

## 2016-11-01 ENCOUNTER — Encounter: Payer: Self-pay | Admitting: Pediatric Intensive Care

## 2016-11-01 NOTE — Congregational Nurse Program (Signed)
Congregational Nurse Program Note  Date of Encounter: 11/01/2016  Past Medical History: Past Medical History:  Diagnosis Date  . Hypertension   . Sarcoid     Encounter Details:     CNP Questionnaire - 11/01/16 0930      Patient Demographics   Is this a new or existing patient? Existing   Patient is considered a/an Not Applicable   Race African-American/Black     Patient Assistance   Location of Patient Assistance [facility]   Patient's financial/insurance status Self-Pay (Uninsured)   Uninsured Patient ([employer] Card/Care Connects) Yes   Interventions Assisted patient in making appt.   Patient referred to apply for the following financial assistance [facility];[employer]   Food insecurities addressed Not Applicable   Transportation assistance No   Assistance securing medications No   Product/process development scientist the healthcare system     Encounter Details   Primary purpose of visit Navigating the Healthcare System   Was an Emergency Department visit averted? Not Applicable   Does patient have a medical provider? Yes   Patient referred to Follow up with established PCP   Was a mental health screening completed? (GAINS tool) No   Does patient have dental issues? No   Does patient have vision issues? No   Does your patient have an abnormal blood pressure today? No   Since previous encounter, have you referred patient for abnormal blood pressure that resulted in a new diagnosis or medication change? No   Does your patient have an abnormal blood glucose today? No   Since previous encounter, have you referred patient for abnormal blood glucose that resulted in a new diagnosis or medication change? No   Was there a life-saving intervention made? No    PCP follow up. Client states that his lisinopril was discontinued and he doesn't understand why. Client states that he is feeling very bloated and is having problems breathing. Client states that he has  access to his oxygen but does not feel comfortable using it in the shelter environment even at night. Client is taking his lasix daily. He states that he has some lisinopril left and took a pill yesterday. BP is WNL this morning this morning. Client is dozing off during encounter. CN assisted client with making reyurn appointment for [facility] for tomorrow at 0800.

## 2016-11-02 ENCOUNTER — Encounter: Payer: Self-pay | Admitting: Family Medicine

## 2016-11-02 ENCOUNTER — Ambulatory Visit (HOSPITAL_COMMUNITY)
Admission: RE | Admit: 2016-11-02 | Discharge: 2016-11-02 | Disposition: A | Discharge status: Home / Self Care | Payer: Self-pay | Source: Ambulatory Visit | Attending: Family Medicine | Admitting: Family Medicine

## 2016-11-02 ENCOUNTER — Other Ambulatory Visit: Payer: Self-pay | Admitting: Family Medicine

## 2016-11-02 ENCOUNTER — Telehealth: Payer: Self-pay | Admitting: Family Medicine

## 2016-11-02 ENCOUNTER — Other Ambulatory Visit: Payer: Self-pay

## 2016-11-02 ENCOUNTER — Ambulatory Visit (INDEPENDENT_AMBULATORY_CARE_PROVIDER_SITE_OTHER): Payer: Self-pay | Admitting: Family Medicine

## 2016-11-02 VITALS — BP 130/80 | HR 80 | Temp 97.8°F | Resp 16 | Ht 72.0 in | Wt 303.0 lb

## 2016-11-02 DIAGNOSIS — R140 Abdominal distension (gaseous): Secondary | ICD-10-CM

## 2016-11-02 DIAGNOSIS — I10 Essential (primary) hypertension: Secondary | ICD-10-CM | POA: Insufficient documentation

## 2016-11-02 DIAGNOSIS — R609 Edema, unspecified: Secondary | ICD-10-CM

## 2016-11-02 DIAGNOSIS — G4719 Other hypersomnia: Secondary | ICD-10-CM

## 2016-11-02 DIAGNOSIS — R0602 Shortness of breath: Secondary | ICD-10-CM

## 2016-11-02 LAB — COMPREHENSIVE METABOLIC PANEL
ALBUMIN: 2.7 g/dL — AB (ref 3.5–5.0)
ALT: 38 U/L (ref 17–63)
ANION GAP: 8 (ref 5–15)
AST: 36 U/L (ref 15–41)
Alkaline Phosphatase: 124 U/L (ref 38–126)
BILIRUBIN TOTAL: 1.1 mg/dL (ref 0.3–1.2)
BUN: 58 mg/dL — AB (ref 6–20)
CHLORIDE: 100 mmol/L — AB (ref 101–111)
CO2: 31 mmol/L (ref 22–32)
Calcium: 7.9 mg/dL — ABNORMAL LOW (ref 8.9–10.3)
Creatinine, Ser: 1.83 mg/dL — ABNORMAL HIGH (ref 0.61–1.24)
GFR calc Af Amer: 49 mL/min — ABNORMAL LOW (ref >60)
GFR, EST NON AFRICAN AMERICAN: 42 mL/min — AB (ref >60)
Glucose, Bld: 94 mg/dL (ref 65–99)
POTASSIUM: 5 mmol/L (ref 3.5–5.1)
Sodium: 139 mmol/L (ref 135–145)
TOTAL PROTEIN: 6.1 g/dL — AB (ref 6.5–8.1)

## 2016-11-02 LAB — CBC
HEMATOCRIT: 52.2 % — AB (ref 39.0–52.0)
Hemoglobin: 15.5 g/dL (ref 13.0–17.0)
MCH: 25.5 pg — ABNORMAL LOW (ref 26.0–34.0)
MCHC: 29.7 g/dL — AB (ref 30.0–36.0)
MCV: 86 fL (ref 78.0–100.0)
PLATELETS: 152 10*3/uL (ref 150–400)
RBC: 6.07 MIL/uL — ABNORMAL HIGH (ref 4.22–5.81)
RDW: 18.3 % — AB (ref 11.5–15.5)
WBC: 5.2 10*3/uL (ref 4.0–10.5)

## 2016-11-02 LAB — BRAIN NATRIURETIC PEPTIDE: B NATRIURETIC PEPTIDE 5: 729.6 pg/mL — AB (ref 0.0–100.0)

## 2016-11-02 MED ORDER — LISINOPRIL 20 MG PO TABS: 20.0000 mg | ORAL_TABLET | Freq: Every day | ORAL | 3 refills | Status: DC

## 2016-11-02 MED ORDER — TORSEMIDE 20 MG PO TABS: 20.0000 mg | ORAL_TABLET | Freq: Every day | ORAL | 0 refills | Status: DC

## 2016-11-02 MED ORDER — IBUPROFEN 800 MG PO TABS: 800.0000 mg | ORAL_TABLET | Freq: Two times a day (BID) | ORAL | 0 refills | Status: DC | PRN

## 2016-11-02 MED FILL — TORSEMIDE 20 MG TABLET: 20 | 5 days supply | Qty: 5 | Fill #0

## 2016-11-02 MED FILL — IBUPROFEN 800 MG TABLET: 800 | 10 days supply | Qty: 21 | Fill #0

## 2016-11-02 NOTE — Telephone Encounter (Signed)
[facility] states that they didn't have enough of specimen

## 2016-11-02 NOTE — Progress Notes (Signed)
Patient ID: Hunter [name], male    DOB: 02-Oct-1970, 46 y.o.   MRN: [id number]  PCP: Bing Neighbors, FNP  Chief Complaint  Patient presents with  . Bloated    Subjective:  HPI Hunter [name] is a 46 y.o. male presents for evaluation of bloating and shortness of breath over the last 6 days. Hunter [name] has a history of chronic cigarette smoking,  sarcoidosis, CKD 3, dyspnea, and hypertension. Today, Hunter [name] reports ongoing abdominal bloating and distention for almost 1 week. As his abdomen increases in girth, he is experiencing worsening of his chronic shortness of breath. Hunter [name] denies urinary retention or constipation. He is not having abdominal pain. Reports leg and hand cramping. He takes chronic furosemide 40 mg daily and doesn't take potassium replacement. He was seen in office last week and advised that his lisinopril was hold until renal function labs resulted. He has a history of renal insufficiency and no recent labs on file to verify current renal function status. He has home oxygen although reports inconsistency of use. Hunter [name] also continues to smoke cigarettes daily. He denies chest pain. Social History   Social History  . Marital status: Single    Spouse name: N/A  . Number of children: N/A  . Years of education: N/A   Occupational History  . Not on file.   Social History Main Topics  . Smoking status: Current Some Day Smoker    Packs/day: 0.50    Years: 10.00    Types: Cigarettes  . Smokeless tobacco: Never Used  . Alcohol use 1.8 oz/week    3 Cans of beer per week  . Drug use: Unknown  . Sexual activity: Not on file   Other Topics Concern  . Not on file   Social History Narrative  . No narrative on file    Family History  Problem Relation Age of Onset  . Family history unknown: Yes   Review of Systems See HPI  Patient Active Problem List   Diagnosis Date Noted  . Hypersomnia 03/02/2016  . Sarcoidosis 02/18/2016  . Hypoxemia 02/18/2016  . Morbid obesity  (HCC) 02/18/2016    Allergies  Allergen Reactions  . Thalidomide Other (See Comments)    Derm,resp    Prior to Admission medications   Medication Sig Start Date End Date Taking? Authorizing Provider  atorvastatin (LIPITOR) 20 MG tablet Take 1 tablet (20 mg total) by mouth daily. 10/26/16  Yes Bing Neighbors, FNP  furosemide (LASIX) 40 MG tablet Take 1 tablet (40 mg total) by mouth daily. 10/26/16  Yes Bing Neighbors, FNP  HYDROcodone-acetaminophen (NORCO/VICODIN) 5-325 MG tablet Take 2 tablets by mouth every 4 (four) hours as needed. 08/11/16  Yes Deatra Canter, FNP  hydrOXYzine (VISTARIL) 100 MG capsule Take 1 capsule (100 mg total) by mouth daily. 10/26/16  Yes Bing Neighbors, FNP  ibuprofen (ADVIL,MOTRIN) 800 MG tablet Take 1 tablet (800 mg total) by mouth 3 (three) times daily. 08/11/16  Yes OxfordAnselm Pancoast, FNP  trimethoprim-polymyxin b (POLYTRIM) ophthalmic solution Place 1 drop into the right eye every 4 (four) hours. 08/11/16  Yes Deatra Canter, FNP  lisinopril (PRINIVIL,ZESTRIL) 20 MG tablet Take 20 mg by mouth daily. 03/06/15   [provider]    Past Medical, Surgical Family and Social History reviewed and updated.    Objective:   Today's Vitals   11/02/16 0803  BP: 130/80  Pulse: 80  Resp: 16  Temp: 97.8 F (36.6 C)  TempSrc: Oral  SpO2: 94%  Weight: (!) 303 lb (137.4 kg)  Height: 6' (1.829 m)    Wt Readings from Last 3 Encounters:  11/02/16 (!) 303 lb (137.4 kg)  10/26/16 295 lb (133.8 kg)  08/02/16 283 lb 3.2 oz (128.5 kg)   Physical Exam  Constitutional: He is oriented to person, place, and time. He appears well-nourished. He appears lethargic.  Very drowsy and nodding off during visit. O2 Sats 94% on RA  HENT:  Head: Normocephalic and atraumatic.  Neck: Normal range of motion. Neck supple.  Cardiovascular: Normal rate, regular rhythm, normal heart sounds and intact distal pulses.   Pulmonary/Chest: He has decreased breath sounds  in the right upper field, the right middle field, the right lower field, the left upper field and the left middle field. He has no wheezes. He has no rhonchi. He has no rales.  Abdominal: Bowel sounds are normal. He exhibits distension.  Pronounced distension of the abdomen. Non-tender abdomen.   Musculoskeletal: Normal range of motion.  Lymphadenopathy:    He has no cervical adenopathy.  Neurological: He is oriented to person, place, and time. He appears lethargic.  Skin: Skin is warm and dry.  Psychiatric: He has a normal mood and affect. His behavior is normal. Judgment and thought content normal.   Assessment & Plan:  1. Distended abdomen, fluid retention. BMP elevated -729.6, currently prescribed daily lasix and reports compliance. Will d/c furosemide temporarily, and trial  torsemide 20 mg daily x 5 days. 2. Shortness of breath, likely secondary to Sarcoidosis and continue tobacco abuse, continue home oxygen, obtaining echocardiogram to rule out active HF,  3. Fluid retention- counseled on fluid restriction, avoid added salt, and take torsemide 20 mg daily x 5 days 4. Excessive daytime sleepiness-Split sleep study  5. Morbid obesity (HCC)-chronic, likely complicating over management of chronic illness. Currently residing in a homeless shelter therefore unable to individually select food options. Encouraged physical activity as tolerated.   Patient was taken out of work, until he returns for further evaluation of symptoms and repeat BMP. If no improvement in symptoms with diuretic therapy and BNP remains elevated, will refer patient for impatient services.  RTC: 4 days, repeat stat BMP, obtain chest x-ray, encouraged to complete patient assistance application as assistance from cardiology and pulmonology will be appreciated in managing the care of this patient.   Godfrey Pick. Tiburcio Pea, MSN, FNP-C [facility]  [address] [place], [place]  [address] [phone]

## 2016-11-02 NOTE — Patient Instructions (Addendum)
-  Discontinue Furosemide-start Torsemide 20 mg once daily and take for the next 5 days. Return to the office for repeat labs 11/05/16.  I am taking you out of work until you follow-up in office on Friday.     Edema Edema is when you have too much fluid in your body or under your skin. Edema may make your legs, feet, and ankles swell up. Swelling is also common in looser tissues, like around your eyes. This is a common condition. It gets more common as you get older. There are many possible causes of edema. Eating too much salt (sodium) and being on your feet or sitting for a long time can cause edema in your legs, feet, and ankles. Hot weather may make edema worse. Edema is usually painless. Your skin may look swollen or shiny. Follow these instructions at home:  Keep the swollen body part raised (elevated) above the level of your heart when you are sitting or lying down.  Do not sit still or stand for a long time.  Do not wear tight clothes. Do not wear garters on your upper legs.  Exercise your legs. This can help the swelling go down.  Wear elastic bandages or support stockings as told by your doctor.  Eat a low-salt (low-sodium) diet to reduce fluid as told by your doctor.  Depending on the cause of your swelling, you may need to limit how much fluid you drink (fluid restriction).  Take over-the-counter and prescription medicines only as told by your doctor. Contact a doctor if:  Treatment is not working.  You have heart, liver, or kidney disease and have symptoms of edema.  You have sudden and unexplained weight gain. Get help right away if:  You have shortness of breath or chest pain.  You cannot breathe when you lie down.  You have pain, redness, or warmth in the swollen areas.  You have heart, liver, or kidney disease and get edema all of a sudden.  You have a fever and your symptoms get worse all of a sudden. Summary  Edema is when you have too much fluid in your  body or under your skin.  Edema may make your legs, feet, and ankles swell up. Swelling is also common in looser tissues, like around your eyes.  Raise (elevate) the swollen body part above the level of your heart when you are sitting or lying down.  Follow your doctor's instructions about diet and how much fluid you can drink (fluid restriction). This information is not intended to replace advice given to you by your health care provider. Make sure you discuss any questions you have with your health care provider. Document Released: 07/14/2007 Document Revised: 02/13/2016 Document Reviewed: 02/13/2016 Elsevier Interactive Patient Education  2017 [employer].

## 2016-11-02 NOTE — Telephone Encounter (Signed)
Please reach out to [facility] for patient with Dr. Clayborn Bigness, MD or an available provider within the next 1-2 weeks. Patient is already established and is completing [place] Patient Assistance application.  Godfrey Pick. Tiburcio Pea, MSN, FNP-C [facility]  [address] [place] [place], [place] [address] [phone]

## 2016-11-02 NOTE — Telephone Encounter (Signed)
Please schedule patient for both echo and split sleep study as soon as possible.  Godfrey Pick. Tiburcio Pea, MSN, FNP-C [facility]  [address] [place], [place] [address] [phone]

## 2016-11-02 NOTE — Progress Notes (Signed)
STAT lab draw for patient.

## 2016-11-03 NOTE — Telephone Encounter (Signed)
Both scheduled.

## 2016-11-04 NOTE — Telephone Encounter (Signed)
Left a vm for patient to callback regarding his appointment

## 2016-11-05 ENCOUNTER — Inpatient Hospital Stay (HOSPITAL_COMMUNITY)
Admission: EM | Admit: 2016-11-05 | Discharge: 2016-11-17 | DRG: 286 | Disposition: A | Discharge status: Home Health | Payer: Medicaid Other | Attending: Internal Medicine | Admitting: Internal Medicine

## 2016-11-05 ENCOUNTER — Encounter: Payer: Self-pay | Admitting: Family Medicine

## 2016-11-05 ENCOUNTER — Other Ambulatory Visit: Payer: Self-pay

## 2016-11-05 ENCOUNTER — Telehealth: Payer: Self-pay | Admitting: Family Medicine

## 2016-11-05 ENCOUNTER — Ambulatory Visit (HOSPITAL_COMMUNITY)
Admission: RE | Admit: 2016-11-05 | Discharge: 2016-11-05 | Disposition: A | Discharge status: Home / Self Care | Payer: Self-pay | Source: Ambulatory Visit | Attending: Family Medicine | Admitting: Family Medicine

## 2016-11-05 ENCOUNTER — Emergency Department (HOSPITAL_COMMUNITY): Payer: Medicaid Other

## 2016-11-05 ENCOUNTER — Encounter (HOSPITAL_COMMUNITY): Payer: Self-pay | Admitting: Emergency Medicine

## 2016-11-05 DIAGNOSIS — I5043 Acute on chronic combined systolic (congestive) and diastolic (congestive) heart failure: Secondary | ICD-10-CM | POA: Diagnosis present

## 2016-11-05 DIAGNOSIS — Z6841 Body Mass Index (BMI) 40.0 and over, adult: Secondary | ICD-10-CM

## 2016-11-05 DIAGNOSIS — I509 Heart failure, unspecified: Secondary | ICD-10-CM

## 2016-11-05 DIAGNOSIS — I5023 Acute on chronic systolic (congestive) heart failure: Secondary | ICD-10-CM | POA: Diagnosis present

## 2016-11-05 DIAGNOSIS — J9621 Acute and chronic respiratory failure with hypoxia: Secondary | ICD-10-CM | POA: Diagnosis present

## 2016-11-05 DIAGNOSIS — R601 Generalized edema: Secondary | ICD-10-CM

## 2016-11-05 DIAGNOSIS — J9622 Acute and chronic respiratory failure with hypercapnia: Secondary | ICD-10-CM | POA: Diagnosis present

## 2016-11-05 DIAGNOSIS — I5082 Biventricular heart failure: Secondary | ICD-10-CM | POA: Diagnosis not present

## 2016-11-05 DIAGNOSIS — L8990 Pressure ulcer of unspecified site, unspecified stage: Secondary | ICD-10-CM | POA: Diagnosis present

## 2016-11-05 DIAGNOSIS — R7303 Prediabetes: Secondary | ICD-10-CM | POA: Diagnosis present

## 2016-11-05 DIAGNOSIS — E785 Hyperlipidemia, unspecified: Secondary | ICD-10-CM | POA: Diagnosis present

## 2016-11-05 DIAGNOSIS — Z9119 Patient's noncompliance with other medical treatment and regimen: Secondary | ICD-10-CM

## 2016-11-05 DIAGNOSIS — Z9111 Patient's noncompliance with dietary regimen: Secondary | ICD-10-CM

## 2016-11-05 DIAGNOSIS — I440 Atrioventricular block, first degree: Secondary | ICD-10-CM | POA: Diagnosis present

## 2016-11-05 DIAGNOSIS — Z862 Personal history of diseases of the blood and blood-forming organs and certain disorders involving the immune mechanism: Secondary | ICD-10-CM

## 2016-11-05 DIAGNOSIS — D860 Sarcoidosis of lung: Secondary | ICD-10-CM | POA: Diagnosis not present

## 2016-11-05 DIAGNOSIS — M109 Gout, unspecified: Secondary | ICD-10-CM | POA: Diagnosis present

## 2016-11-05 DIAGNOSIS — I444 Left anterior fascicular block: Secondary | ICD-10-CM | POA: Diagnosis present

## 2016-11-05 DIAGNOSIS — I50810 Right heart failure, unspecified: Secondary | ICD-10-CM

## 2016-11-05 DIAGNOSIS — N183 Chronic kidney disease, stage 3 unspecified: Secondary | ICD-10-CM

## 2016-11-05 DIAGNOSIS — E669 Obesity, unspecified: Secondary | ICD-10-CM

## 2016-11-05 DIAGNOSIS — I4510 Unspecified right bundle-branch block: Secondary | ICD-10-CM | POA: Diagnosis present

## 2016-11-05 DIAGNOSIS — Z9981 Dependence on supplemental oxygen: Secondary | ICD-10-CM

## 2016-11-05 DIAGNOSIS — E875 Hyperkalemia: Secondary | ICD-10-CM | POA: Diagnosis not present

## 2016-11-05 DIAGNOSIS — E662 Morbid (severe) obesity with alveolar hypoventilation: Secondary | ICD-10-CM | POA: Diagnosis present

## 2016-11-05 DIAGNOSIS — J984 Other disorders of lung: Secondary | ICD-10-CM | POA: Diagnosis present

## 2016-11-05 DIAGNOSIS — D869 Sarcoidosis, unspecified: Secondary | ICD-10-CM

## 2016-11-05 DIAGNOSIS — I480 Paroxysmal atrial fibrillation: Secondary | ICD-10-CM | POA: Diagnosis present

## 2016-11-05 DIAGNOSIS — R451 Restlessness and agitation: Secondary | ICD-10-CM | POA: Diagnosis not present

## 2016-11-05 DIAGNOSIS — Z79899 Other long term (current) drug therapy: Secondary | ICD-10-CM

## 2016-11-05 DIAGNOSIS — E872 Acidosis: Secondary | ICD-10-CM | POA: Diagnosis present

## 2016-11-05 DIAGNOSIS — N179 Acute kidney failure, unspecified: Secondary | ICD-10-CM | POA: Diagnosis present

## 2016-11-05 DIAGNOSIS — F17210 Nicotine dependence, cigarettes, uncomplicated: Secondary | ICD-10-CM | POA: Diagnosis present

## 2016-11-05 DIAGNOSIS — Z888 Allergy status to other drugs, medicaments and biological substances status: Secondary | ICD-10-CM

## 2016-11-05 DIAGNOSIS — Z590 Homelessness: Secondary | ICD-10-CM

## 2016-11-05 DIAGNOSIS — I4581 Long QT syndrome: Secondary | ICD-10-CM | POA: Diagnosis present

## 2016-11-05 DIAGNOSIS — J9602 Acute respiratory failure with hypercapnia: Secondary | ICD-10-CM | POA: Clinically undetermined

## 2016-11-05 DIAGNOSIS — J90 Pleural effusion, not elsewhere classified: Secondary | ICD-10-CM

## 2016-11-05 DIAGNOSIS — G4733 Obstructive sleep apnea (adult) (pediatric): Secondary | ICD-10-CM | POA: Diagnosis present

## 2016-11-05 DIAGNOSIS — N049 Nephrotic syndrome with unspecified morphologic changes: Secondary | ICD-10-CM | POA: Diagnosis present

## 2016-11-05 DIAGNOSIS — I130 Hypertensive heart and chronic kidney disease with heart failure and stage 1 through stage 4 chronic kidney disease, or unspecified chronic kidney disease: Secondary | ICD-10-CM | POA: Diagnosis present

## 2016-11-05 DIAGNOSIS — I2721 Secondary pulmonary arterial hypertension: Secondary | ICD-10-CM | POA: Diagnosis present

## 2016-11-05 DIAGNOSIS — E876 Hypokalemia: Secondary | ICD-10-CM | POA: Diagnosis not present

## 2016-11-05 DIAGNOSIS — Z9114 Patient's other noncompliance with medication regimen: Secondary | ICD-10-CM

## 2016-11-05 DIAGNOSIS — I5033 Acute on chronic diastolic (congestive) heart failure: Secondary | ICD-10-CM

## 2016-11-05 DIAGNOSIS — N057 Unspecified nephritic syndrome with diffuse crescentic glomerulonephritis: Secondary | ICD-10-CM | POA: Diagnosis present

## 2016-11-05 DIAGNOSIS — N170 Acute kidney failure with tubular necrosis: Secondary | ICD-10-CM

## 2016-11-05 DIAGNOSIS — I50811 Acute right heart failure: Secondary | ICD-10-CM

## 2016-11-05 DIAGNOSIS — R609 Edema, unspecified: Secondary | ICD-10-CM

## 2016-11-05 DIAGNOSIS — Z09 Encounter for follow-up examination after completed treatment for conditions other than malignant neoplasm: Secondary | ICD-10-CM

## 2016-11-05 DIAGNOSIS — I951 Orthostatic hypotension: Secondary | ICD-10-CM | POA: Diagnosis present

## 2016-11-05 DIAGNOSIS — J811 Chronic pulmonary edema: Secondary | ICD-10-CM | POA: Diagnosis present

## 2016-11-05 LAB — I-STAT TROPONIN, ED: Troponin i, poc: 0 ng/mL (ref 0.00–0.08)

## 2016-11-05 LAB — BASIC METABOLIC PANEL
ANION GAP: 7 (ref 5–15)
BUN: 47 mg/dL — ABNORMAL HIGH (ref 6–20)
CHLORIDE: 103 mmol/L (ref 101–111)
CO2: 33 mmol/L — AB (ref 22–32)
Calcium: 8 mg/dL — ABNORMAL LOW (ref 8.9–10.3)
Creatinine, Ser: 1.52 mg/dL — ABNORMAL HIGH (ref 0.61–1.24)
GFR calc non Af Amer: 53 mL/min — ABNORMAL LOW (ref >60)
GLUCOSE: 95 mg/dL (ref 65–99)
Potassium: 5.7 mmol/L — ABNORMAL HIGH (ref 3.5–5.1)
Sodium: 143 mmol/L (ref 135–145)

## 2016-11-05 LAB — COMPREHENSIVE METABOLIC PANEL
ALBUMIN: 3 g/dL — AB (ref 3.5–5.0)
ALK PHOS: 125 U/L (ref 38–126)
ALT: 35 U/L (ref 17–63)
AST: 37 U/L (ref 15–41)
Anion gap: 8 (ref 5–15)
BUN: 46 mg/dL — ABNORMAL HIGH (ref 6–20)
CHLORIDE: 106 mmol/L (ref 101–111)
CO2: 28 mmol/L (ref 22–32)
Calcium: 8 mg/dL — ABNORMAL LOW (ref 8.9–10.3)
Creatinine, Ser: 1.48 mg/dL — ABNORMAL HIGH (ref 0.61–1.24)
GFR calc Af Amer: >60 mL/min (ref >60)
GFR calc non Af Amer: 54 mL/min — ABNORMAL LOW (ref >60)
GLUCOSE: 124 mg/dL — AB (ref 65–99)
POTASSIUM: 5 mmol/L (ref 3.5–5.1)
SODIUM: 142 mmol/L (ref 135–145)
Total Bilirubin: 0.9 mg/dL (ref 0.3–1.2)
Total Protein: 6.5 g/dL (ref 6.5–8.1)

## 2016-11-05 LAB — BRAIN NATRIURETIC PEPTIDE
B NATRIURETIC PEPTIDE 5: 1019.5 pg/mL — AB (ref 0.0–100.0)
B Natriuretic Peptide: 1034.7 pg/mL — ABNORMAL HIGH (ref 0.0–100.0)

## 2016-11-05 LAB — MAGNESIUM: MAGNESIUM: 2.1 mg/dL (ref 1.7–2.4)

## 2016-11-05 MED ORDER — ONDANSETRON HCL 4 MG/2ML IJ SOLN: 4.0000 mg | Freq: Four times a day (QID) | INTRAMUSCULAR | Status: DC | PRN

## 2016-11-05 MED ORDER — ACETAMINOPHEN 650 MG RE SUPP: 650.0000 mg | Freq: Four times a day (QID) | RECTAL | Status: DC | PRN

## 2016-11-05 MED ORDER — FUROSEMIDE 10 MG/ML IJ SOLN
40.0000 mg | Freq: Two times a day (BID) | INTRAMUSCULAR | Status: DC
  Administered 2016-11-06: 40 mg via INTRAVENOUS
  Filled 2016-11-05: qty 4

## 2016-11-05 MED ORDER — ONDANSETRON HCL 4 MG PO TABS: 4.0000 mg | ORAL_TABLET | Freq: Four times a day (QID) | ORAL | Status: DC | PRN

## 2016-11-05 MED ORDER — HEPARIN SODIUM (PORCINE) 5000 UNIT/ML IJ SOLN
5000.0000 [IU] | Freq: Three times a day (TID) | INTRAMUSCULAR | Status: DC
  Administered 2016-11-06 – 2016-11-17 (×30): 5000 [IU] via SUBCUTANEOUS
  Filled 2016-11-05 (×33): qty 1

## 2016-11-05 MED ORDER — ATORVASTATIN CALCIUM 10 MG PO TABS: 20.0000 mg | ORAL_TABLET | Freq: Every day | ORAL | Status: DC

## 2016-11-05 MED ORDER — NITROGLYCERIN IN D5W 200-5 MCG/ML-% IV SOLN
0.0000 ug/min | Freq: Once | INTRAVENOUS | Status: AC
  Administered 2016-11-05: 5 ug/min via INTRAVENOUS
  Filled 2016-11-05: qty 250

## 2016-11-05 MED ORDER — SODIUM POLYSTYRENE SULFONATE 15 GM/60ML PO SUSP
30.0000 g | Freq: Once | ORAL | Status: AC
  Administered 2016-11-05: 30 g via ORAL
  Filled 2016-11-05: qty 120

## 2016-11-05 MED ORDER — HYDROXYZINE HCL 50 MG PO TABS
100.0000 mg | ORAL_TABLET | Freq: Every day | ORAL | Status: DC
  Administered 2016-11-05: 100 mg via ORAL
  Filled 2016-11-05 (×2): qty 2

## 2016-11-05 MED ORDER — POLYETHYLENE GLYCOL 3350 17 G PO PACK
17.0000 g | PACK | Freq: Every day | ORAL | Status: DC | PRN
  Administered 2016-11-10: 17 g via ORAL
  Filled 2016-11-05: qty 1

## 2016-11-05 MED ORDER — ACETAMINOPHEN 325 MG PO TABS
650.0000 mg | ORAL_TABLET | Freq: Four times a day (QID) | ORAL | Status: DC | PRN
  Administered 2016-11-08 – 2016-11-10 (×6): 650 mg via ORAL
  Filled 2016-11-05 (×6): qty 2

## 2016-11-05 NOTE — ED Notes (Signed)
Bed: WA14 Expected date:  Expected time:  Means of arrival:  Comments: RES B 

## 2016-11-05 NOTE — H&P (Addendum)
History and Physical    Hunter [name] WUJ:[id number] DOB: Sep 10, 1970 DOA: 11/05/2016  PCP: Bing Neighbors, FNP  Patient coming from: Home  Chief Complaint: Shortness of breath, edema   HPI: Hunter [name] is a 46 y.o. male with medical history significant of sarcoidosis, chronic hypoxic respiratory failure, hypertension, CKD stage III. He is a very poor historian. He states that he has been following with nephrology and was on Lasix twice a day. This was recently changed in the past few days by his new primary care physician. He states that since his medications have been changed, he noted decrease in urine output, worsening shortness of breath as well as increased swelling of his abdomen and lower extremities. He is very upset his primary care physician on why his medications have been changed. On chart review of his office records on 9/25, it appears that patient had already been complaining of abdominal bloating for a week at that time despite taking furosemide. His primary care physician had changed his medication from furosemide to torsemide. He is unsure what his kidney diseases from, unsure if he has ever been diagnosed with heart failure. He is supposed to be on home oxygen at baseline, but due to being at a homeless shelter, has not been using it in the past month.  ED Course: Labs were obtained which revealed BNP 1019. Chest x-ray revealed findings consistent with congestive heart failure. Patient was started on nitro drip. [facility] called for admission.  Review of Systems: As per HPI otherwise 10 point review of systems negative.   Past Medical History:  Diagnosis Date  . Hypertension   . Sarcoid     History reviewed. No pertinent surgical history.   reports that he has been smoking Cigarettes.  He has a 5.00 pack-year smoking history. He has never used smokeless tobacco. He reports that he drinks about 1.8 oz of alcohol per week . His drug history is not on file.  Allergies    Allergen Reactions  . Thalidomide Other (See Comments)    Derm,resp    Family History  Problem Relation Age of Onset  . Family history unknown: Yes    Prior to Admission medications   Medication Sig Start Date End Date Taking? Authorizing Provider  atorvastatin (LIPITOR) 20 MG tablet Take 1 tablet (20 mg total) by mouth daily. 10/26/16  Yes Bing Neighbors, FNP  hydrOXYzine (VISTARIL) 100 MG capsule Take 1 capsule (100 mg total) by mouth daily. 10/26/16  Yes Bing Neighbors, FNP  ibuprofen (ADVIL,MOTRIN) 800 MG tablet Take 800 mg by mouth every 6 (six) hours as needed for moderate pain.   Yes [provider]  torsemide (DEMADEX) 20 MG tablet Take 1 tablet (20 mg total) by mouth daily. 11/02/16  Yes Bing Neighbors, FNP  furosemide (LASIX) 40 MG tablet Take 1 tablet (40 mg total) by mouth daily. Patient not taking: Reported on 11/05/2016 10/26/16   Bing Neighbors, FNP  HYDROcodone-acetaminophen (NORCO/VICODIN) 5-325 MG tablet Take 2 tablets by mouth every 4 (four) hours as needed. Patient not taking: Reported on 11/05/2016 08/11/16   Deatra Canter, FNP  lisinopril (PRINIVIL,ZESTRIL) 20 MG tablet Take 1 tablet (20 mg total) by mouth daily. Patient not taking: Reported on 11/05/2016 11/02/16   Bing Neighbors, FNP  trimethoprim-polymyxin b (POLYTRIM) ophthalmic solution Place 1 drop into the right eye every 4 (four) hours. Patient not taking: Reported on 11/05/2016 08/11/16   Deatra Canter, FNP    Physical Exam: Vitals:  11/05/16 1530 11/05/16 1545 11/05/16 1600 11/05/16 1615  BP: (!) 144/102 (!) 154/100 (!) 146/109 (!) 136/94  Pulse: 94 83 80 79  Resp: (!) 24 (!) 21 (!) 25 20  Temp:      TempSrc:      SpO2: 100% (!) 88% 99% 100%  Weight:      Height:        Constitutional: NAD, calm, comfortable Eyes: PERRL, lids and conjunctivae normal ENMT: Mucous membranes are moist. Posterior pharynx clear of any exudate or lesions.Normal dentition.  Neck: normal,  supple, no masses, no thyromegaly Respiratory: diminished breath sounds to auscultation bilaterally, no wheezing. Normal respiratory effort. No accessory muscle use. On NRB  Cardiovascular: Regular rate and rhythm, no murmurs / rubs / gallops. +extremity edema Abdomen: no tenderness, no masses palpated. No hepatosplenomegaly. Bowel sounds positive. +distended  Musculoskeletal: no clubbing / cyanosis. No joint deformity upper and lower extremities. Good ROM, no contractures. Normal muscle tone.  Skin: no rashes, lesions, ulcers. No induration Neurologic: CN 2-12 grossly intact. Strength 5/5 in all 4.  Psychiatric: Alert and oriented x 3. Appears very irritated, Poor judgment and insight.   Labs on Admission: I have personally reviewed following labs and imaging studies  CBC:  Recent Labs Lab 11/02/16 0841  WBC 5.2  HGB 15.5  HCT 52.2*  MCV 86.0  PLT 152   Basic Metabolic Panel:  Recent Labs Lab 11/02/16 0841 11/05/16 1133 11/05/16 1414 11/05/16 1420  NA 139 142 143  --   K 5.0 5.0 5.7*  --   CL 100* 106 103  --   CO2 31 28 33*  --   GLUCOSE 94 124* 95  --   BUN 58* 46* 47*  --   CREATININE 1.83* 1.48* 1.52*  --   CALCIUM 7.9* 8.0* 8.0*  --   MG  --   --   --  2.1   GFR: Estimated Creatinine Clearance: 85.3 mL/min (A) (by C-G formula based on SCr of 1.52 mg/dL (H)). Liver Function Tests:  Recent Labs Lab 11/02/16 0841 11/05/16 1133  AST 36 37  ALT 38 35  ALKPHOS 124 125  BILITOT 1.1 0.9  PROT 6.1* 6.5  ALBUMIN 2.7* 3.0*   No results for input(s): LIPASE, AMYLASE in the last 168 hours. No results for input(s): AMMONIA in the last 168 hours. Coagulation Profile: No results for input(s): INR, PROTIME in the last 168 hours. Cardiac Enzymes: No results for input(s): CKTOTAL, CKMB, CKMBINDEX, TROPONINI in the last 168 hours. BNP (last 3 results) No results for input(s): PROBNP in the last 8760 hours. HbA1C: No results for input(s): HGBA1C in the last 72  hours. CBG: No results for input(s): GLUCAP in the last 168 hours. Lipid Profile: No results for input(s): CHOL, HDL, LDLCALC, TRIG, CHOLHDL, LDLDIRECT in the last 72 hours. Thyroid Function Tests: No results for input(s): TSH, T4TOTAL, FREET4, T3FREE, THYROIDAB in the last 72 hours. Anemia Panel: No results for input(s): VITAMINB12, FOLATE, FERRITIN, TIBC, IRON, RETICCTPCT in the last 72 hours. Urine analysis:    Component Value Date/Time   LABSPEC 1.025 10/26/2016 1052   PHURINE 5.5 10/26/2016 1052   GLUCOSEU NEGATIVE 10/26/2016 1052   HGBUR TRACE (A) 10/26/2016 1052   BILIRUBINUR SMALL (A) 10/26/2016 1052   KETONESUR NEGATIVE 10/26/2016 1052   PROTEINUR >=300 (A) 10/26/2016 1052   UROBILINOGEN 1.0 10/26/2016 1052   NITRITE NEGATIVE 10/26/2016 1052   LEUKOCYTESUR NEGATIVE 10/26/2016 1052   Sepsis Labs: !!!!!!!!!!!!!!!!!!!!!!!!!!!!!!!!!!!!!!!!!!!! (procalcitonin:4,lacticidven:4) )No results found for  this or any previous visit (from the past 240 hour(s)).   Radiological Exams on Admission: Dg Chest 2 View  Result Date: 11/05/2016 CLINICAL DATA:  Shortness of Breath EXAM: CHEST  2 VIEW COMPARISON:  February 18, 2016 FINDINGS: There is cardiomegaly with pulmonary venous hypertension. There is a stable focal right pleural effusion. There is interstitial edema. There is patchy airspace opacity in the left lower lobe. No adenopathy. No evident bone lesions. IMPRESSION: Findings consistent with congestive heart failure. Question alveolar edema versus patchy pneumonia left lower lobe. Both entities may exist concurrently. Electronically Signed   By: Bretta Bang III M.D.   On: 11/05/2016 14:26    EKG: Independently reviewed. NSR with PAC, right axis dev, Q waves in I, aVL   Assessment/Plan Active Problems:   CHF (congestive heart failure) (HCC)   Edema -Right heart failure + nephrotic syndrome  -BNP elevated 1019, with chest x-ray findings of cardiomegaly, right  pleural effusion with abdominal distention and lower extremity edema  -Echocardiogram ordered -Start Lasix IV. He states that he was on Lasix 80 mg twice daily, although primary care physician records reveal he was on 40 mg once a day. 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  -Baseline Cr 0.92 in 09/13/2016  -Follows with Dr. Kathrene Bongo as outpatient. She is on call 9/29 for [facility], would benefit from having her onboard, call tomorrow AM   Hyperkalemia -Lasix IV  -Monitor BMP  HLD -Continue lipitor   History of sarcoidosis -Has followed with pulmonology in the past, now off prednisone  Morbid obesity -Body mass index is 41.09 kg/m.   Homelessness -Consult social work  Possible sleep apnea -Has outpatient sleep study scheduled   DVT prophylaxis: subq hep  Code Status: full  Family Communication: no family at bedside  Disposition Plan: Pending improvement  Consults called: Cardiology Admission status: Inpatient   Severity of Illness: The appropriate patient status for this patient is INPATIENT. Inpatient status is judged to be reasonable and necessary in order to provide the required intensity of service to ensure the patient's safety. The patient's presenting symptoms, physical exam findings, and initial radiographic and laboratory data in the context of their chronic comorbidities is felt to place them at high risk for further clinical deterioration. Furthermore, it is not anticipated that the patient will be medically stable for  discharge from the hospital within 2 midnights of admission. The following factors support the patient status of inpatient.   " The patient's presenting symptoms include shortness of breath, peripheral edema. " The worrisome physical exam findings include peripheral edema, abdominal distention, hypoxemia. " The initial radiographic and laboratory data are worrisome because of elevated BNP with vascular congestion on chest x-ray. " The chronic co-morbidities include medical noncompliance, chronic kidney disease, obesity, sarcoidosis.  * I certify that at the point of admission it is my clinical judgment that the patient will require inpatient hospital care spanning beyond 2 midnights from the point of admission due to high intensity of service, high risk for further deterioration and high frequency of surveillance required.*   Noralee Stain, DO [facility] [email or web address] [facility] 11/05/2016, 4:47 PM

## 2016-11-05 NOTE — ED Notes (Signed)
Reports he was just taken off Lasix 240 mg, he says he felt better with it.  He states he used to wear 2L [place] oxygen but has it in storage. Says he is staying at the shelter. He needs to leave by a certain time.

## 2016-11-05 NOTE — Telephone Encounter (Signed)
Patient came into office for stat BNP labs only visit. He persisted in office 11/02/2016 with shortness of breath and was found to have an abnormally elevated BNP. He was prescribed diuretic therapy with torsemide 20 mg daily although he has a history of medication non-compliance. He is also prescribed continuous oxygen therapy for which he is non-compliant. He is being referred to the [facility] ED for consideration of IV diuretic therapy.  Godfrey Pick. Tiburcio Pea, MSN, FNP-C [facility]  [address] [place], [place] [address] [phone]

## 2016-11-05 NOTE — ED Notes (Signed)
Called to the patients PCP. Hunter [name]'s office to ask about patients phone. Per Karren Burly, they are very busy but will look in depth for the phone. Pt is very irritated at this information. States he needs to get those numbers asap.  [phone]

## 2016-11-05 NOTE — ED Notes (Signed)
Patient transported to X-ray 

## 2016-11-05 NOTE — ED Notes (Signed)
Pt resting comfortably

## 2016-11-05 NOTE — ED Provider Notes (Signed)
[facility]-EMERGENCY DEPT Provider Note   CSN: [id number] Arrival date & time: 11/05/16  1315     History   Chief Complaint Chief Complaint  Patient presents with  . Shortness of Breath    HPI Hunter [name] is a 46 y.o. male.  HPI   46 year old obese male with history of sarcoidosis, hypertension, hypersomnia, and hypoxemia sent here from his PCP office for evaluation of shortness of breath.  Patient was seen at his PCP clinic today for a stat BNP labs only visit. He was found to be having difficulty breathing as well as having elevated BNP greater than thousand. Was noted that patient was prescribed diuretic therapy including torsemide 20 mg daily as well as continuous oxygen therapy but does have history of noncompliance.  Patient states he recently established new primary care approximately 3 days ago. He mentioned that his medication were changed by the new provider and shortly afterward he developed worsening fluid retention to his abdomen is legs and complaining of increased shortness of breath. He felt that his symptom is related to medication changes. States he get out of breath even with a short walk. No report of fever, chills, productive cough, nausea vomiting diarrhea, chest pain or diaphoresis. He was on home oxygen but haven't been able to use his home oxygenation for the past several weeks since he is currently living in a shelter. He denies any prior history of PE or DVT, denies any recent travel or recent surgery. Does have history of sarcoid. Sounds like he may have history of undiagnosed obstructive sleep apnea as patient has difficulty with chronic fatigue and difficulty sleeping. Patient sleep with one pillow.  Per chart report, pt has been having difficulty breathing for over a week.  Doesn't appear that he is being complaint with his medical treatment in the past. No documentation of CHF in the past.   Past Medical History:  Diagnosis Date  . Hypertension   . Sarcoid      Patient Active Problem List   Diagnosis Date Noted  . HTN (hypertension) 11/02/2016  . Hypersomnia 03/02/2016  . Sarcoidosis 02/18/2016  . Hypoxemia 02/18/2016  . Morbid obesity (HCC) 02/18/2016    History reviewed. No pertinent surgical history.     Home Medications    Prior to Admission medications   Medication Sig Start Date End Date Taking? Authorizing Provider  atorvastatin (LIPITOR) 20 MG tablet Take 1 tablet (20 mg total) by mouth daily. 10/26/16   Bing Neighbors, FNP  furosemide (LASIX) 40 MG tablet Take 1 tablet (40 mg total) by mouth daily. 10/26/16   Bing Neighbors, FNP  HYDROcodone-acetaminophen (NORCO/VICODIN) 5-325 MG tablet Take 2 tablets by mouth every 4 (four) hours as needed. 08/11/16   Deatra Canter, FNP  hydrOXYzine (VISTARIL) 100 MG capsule Take 1 capsule (100 mg total) by mouth daily. 10/26/16   Bing Neighbors, FNP  lisinopril (PRINIVIL,ZESTRIL) 20 MG tablet Take 1 tablet (20 mg total) by mouth daily. 11/02/16   Bing Neighbors, FNP  torsemide (DEMADEX) 20 MG tablet Take 1 tablet (20 mg total) by mouth daily. 11/02/16   Bing Neighbors, FNP  trimethoprim-polymyxin b (POLYTRIM) ophthalmic solution Place 1 drop into the right eye every 4 (four) hours. 08/11/16   Deatra Canter, FNP    Family History Family History  Problem Relation Age of Onset  . Family history unknown: Yes    Social History Social History  Substance Use Topics  . Smoking status: Current Some Day  Smoker    Packs/day: 0.50    Years: 10.00    Types: Cigarettes  . Smokeless tobacco: Never Used  . Alcohol use 1.8 oz/week    3 Cans of beer per week     Allergies   Thalidomide   Review of Systems Review of Systems  All other systems reviewed and are negative.    Physical Exam Updated Vital Signs BP 128/79   Pulse 83   Temp 97.7 F (36.5 C) (Axillary)   Resp (!) 24   Ht 6' (1.829 m)   Wt (!) 137.4 kg (303 lb)   SpO2 100%   BMI 41.09 kg/m    Physical Exam  Nursing note and vitals reviewed.    ED Treatments / Results  Labs (all labs ordered are listed, but only abnormal results are displayed) Labs Reviewed  BASIC METABOLIC PANEL - Abnormal; Notable for the following:       Result Value   Potassium 5.7 (*)    CO2 33 (*)    BUN 47 (*)    Creatinine, Ser 1.52 (*)    Calcium 8.0 (*)    GFR calc non Af Amer 53 (*)    All other components within normal limits  BRAIN NATRIURETIC PEPTIDE - Abnormal; Notable for the following:    B Natriuretic Peptide 1,019.5 (*)    All other components within normal limits  MAGNESIUM  I-STAT TROPONIN, ED    EKG  EKG Interpretation None     ED ECG REPORT   Date: 11/05/2016  Rate: 88  Rhythm: normal sinus rhythm  QRS Axis: left  Intervals: QT prolonged  ST/T Wave abnormalities: nonspecific ST/T changes  Conduction Disutrbances:right bundle branch block and left anterior fascicular block  Narrative Interpretation:   Old EKG Reviewed: none available  I have personally reviewed the EKG tracing and agree with the computerized printout as noted.   Radiology Dg Chest 2 View  Result Date: 11/05/2016 CLINICAL DATA:  Shortness of Breath EXAM: CHEST  2 VIEW COMPARISON:  February 18, 2016 FINDINGS: There is cardiomegaly with pulmonary venous hypertension. There is a stable focal right pleural effusion. There is interstitial edema. There is patchy airspace opacity in the left lower lobe. No adenopathy. No evident bone lesions. IMPRESSION: Findings consistent with congestive heart failure. Question alveolar edema versus patchy pneumonia left lower lobe. Both entities may exist concurrently. Electronically Signed   By: Bretta Bang III M.D.   On: 11/05/2016 14:26    Procedures Procedures (including critical care time)  Medications Ordered in ED Medications  sodium polystyrene (KAYEXALATE) 15 GM/60ML suspension 30 g (30 g Oral Given 11/05/16 1517)  nitroGLYCERIN 50 mg in dextrose 5  % 250 mL (0.2 mg/mL) infusion (20 mcg/min Intravenous Rate/Dose Change 11/05/16 1607)     Initial Impression / Assessment and Plan / ED Course  I have reviewed the triage vital signs and the nursing notes.  Pertinent labs & imaging results that were available during my care of the patient were reviewed by me and considered in my medical decision making (see chart for details).     BP (!) 128/101   Pulse 83   Temp 97.7 F (36.5 C) (Axillary)   Resp (!) 27   Ht 6' (1.829 m)   Wt (!) 137.4 kg (303 lb)   SpO2 100%   BMI 41.09 kg/m    Final Clinical Impressions(s) / ED Diagnoses   Final diagnoses:  Acute on chronic systolic congestive heart failure (HCC)  New Prescriptions New Prescriptions   No medications on file   3:15 PM Patient here for progressive worsens of shortness of breath and fluid retention. He was found to be in acute CHF with an elevated BNP of greater than thousand, and chest x-ray with findings consistent with congestive heart failure. Questionable AVR light edema versus patchy pneumonia involving the left lower lobe. Patient however denies having fever or productive cough at this time. Evidence of kidney disease with creatinine of 1.5 to near baseline. Elevated potassium of 5.7 with QT prolongation, no peak T-waves.  Will check magnesium.  DIscusseds with Dr. Effie Shy, who recommend Kayexalate at this time.    4:01 PM Appreciate consultation from [facility] Dr. Alvino Chapel who agrees to see pt in the ER and will admit to step down.  She does request for cardiology to be involve in pt's care.  WIll consult cardiology.   4:31 PM I have consulted with cardmaster, Trish, who will notify oncall cardiology to be involve in pt's care.  No specific recommendation to continue with nitrodrip or switch to Lasix at this time.    CRITICAL CARE Performed by: Fayrene Helper Total critical care time: 35 minutes Critical care time was exclusive of separately billable procedures and  treating other patients. Critical care was necessary to treat or prevent imminent or life-threatening deterioration. Critical care was time spent personally by me on the following activities: development of treatment plan with patient and/or surrogate as well as nursing, discussions with consultants, evaluation of patient's response to treatment, examination of patient, obtaining history from patient or surrogate, ordering and performing treatments and interventions, ordering and review of laboratory studies, ordering and review of radiographic studies, pulse oximetry and re-evaluation of patient's condition.    Fayrene Helper, PA-C 11/05/16 1634    Mancel Bale, MD 11/08/16 [phone]

## 2016-11-05 NOTE — Progress Notes (Signed)
Pt arrived for lab draw per order; no complications noted

## 2016-11-05 NOTE — ED Notes (Signed)
Delay in vitals/medication administration due to patient being in the restroom

## 2016-11-05 NOTE — ED Triage Notes (Signed)
Patient reports shortness of breath-fluid retention.  States he was sent by PCP from upstairs for elevated BNP.  Oxygen saturation 77% on RA.  Place on 10L via [place] 90%

## 2016-11-06 ENCOUNTER — Inpatient Hospital Stay (HOSPITAL_COMMUNITY): Payer: Medicaid Other

## 2016-11-06 ENCOUNTER — Encounter: Payer: Self-pay | Admitting: Cardiology

## 2016-11-06 DIAGNOSIS — I509 Heart failure, unspecified: Secondary | ICD-10-CM

## 2016-11-06 DIAGNOSIS — J984 Other disorders of lung: Secondary | ICD-10-CM

## 2016-11-06 DIAGNOSIS — J9602 Acute respiratory failure with hypercapnia: Secondary | ICD-10-CM | POA: Clinically undetermined

## 2016-11-06 DIAGNOSIS — G4733 Obstructive sleep apnea (adult) (pediatric): Secondary | ICD-10-CM | POA: Diagnosis present

## 2016-11-06 DIAGNOSIS — E669 Obesity, unspecified: Secondary | ICD-10-CM

## 2016-11-06 DIAGNOSIS — E662 Morbid (severe) obesity with alveolar hypoventilation: Secondary | ICD-10-CM

## 2016-11-06 DIAGNOSIS — J811 Chronic pulmonary edema: Secondary | ICD-10-CM | POA: Diagnosis present

## 2016-11-06 DIAGNOSIS — J810 Acute pulmonary edema: Secondary | ICD-10-CM

## 2016-11-06 LAB — BASIC METABOLIC PANEL
ANION GAP: 6 (ref 5–15)
BUN: 45 mg/dL — ABNORMAL HIGH (ref 6–20)
CALCIUM: 7.7 mg/dL — AB (ref 8.9–10.3)
CHLORIDE: 104 mmol/L (ref 101–111)
CO2: 33 mmol/L — AB (ref 22–32)
Creatinine, Ser: 1.62 mg/dL — ABNORMAL HIGH (ref 0.61–1.24)
GFR calc non Af Amer: 49 mL/min — ABNORMAL LOW (ref >60)
GFR, EST AFRICAN AMERICAN: 56 mL/min — AB (ref >60)
GLUCOSE: 102 mg/dL — AB (ref 65–99)
POTASSIUM: 5.2 mmol/L — AB (ref 3.5–5.1)
Sodium: 143 mmol/L (ref 135–145)

## 2016-11-06 LAB — BLOOD GAS, ARTERIAL
ACID-BASE DEFICIT: 1.8 mmol/L (ref 0.0–2.0)
ACID-BASE EXCESS: 0.3 mmol/L (ref 0.0–2.0)
ACID-BASE EXCESS: 0.8 mmol/L (ref 0.0–2.0)
BICARBONATE: 32.8 mmol/L — AB (ref 20.0–28.0)
BICARBONATE: 31.1 mmol/L — AB (ref 20.0–28.0)
Bicarbonate: 31 mmol/L — ABNORMAL HIGH (ref 20.0–28.0)
DELIVERY SYSTEMS: POSITIVE
Delivery systems: POSITIVE
Drawn by: 331471
Drawn by: 331471
Drawn by: 331471
EXPIRATORY PAP: 5
Expiratory PAP: 5
FIO2: 30
FIO2: 30
Inspiratory PAP: 20
Inspiratory PAP: 20
O2 CONTENT: 2 L/min
O2 SAT: 84.6 %
O2 SAT: 88.5 %
O2 Saturation: 90.7 %
PATIENT TEMPERATURE: 98.6
PATIENT TEMPERATURE: 98.6
PCO2 ART: 114 mmHg — AB (ref 32.0–48.0)
PCO2 ART: 81.8 mmHg — AB (ref 32.0–48.0)
PH ART: 7.204 — AB (ref 7.350–7.450)
PH ART: 7.224 — AB (ref 7.350–7.450)
PO2 ART: 59.4 mmHg — AB (ref 83.0–108.0)
PO2 ART: 59 mmHg — AB (ref 83.0–108.0)
Patient temperature: 98.6
pCO2 arterial: 77.8 mmHg (ref 32.0–48.0)
pH, Arterial: 7.088 — CL (ref 7.350–7.450)
pO2, Arterial: 64.4 mmHg — ABNORMAL LOW (ref 83.0–108.0)

## 2016-11-06 LAB — CBC
HEMATOCRIT: 51.1 % (ref 39.0–52.0)
HEMOGLOBIN: 14.4 g/dL (ref 13.0–17.0)
MCH: 25.2 pg — ABNORMAL LOW (ref 26.0–34.0)
MCHC: 28.2 g/dL — ABNORMAL LOW (ref 30.0–36.0)
MCV: 89.3 fL (ref 78.0–100.0)
Platelets: 133 10*3/uL — ABNORMAL LOW (ref 150–400)
RBC: 5.72 MIL/uL (ref 4.22–5.81)
RDW: 18.8 % — AB (ref 11.5–15.5)
WBC: 6.1 10*3/uL (ref 4.0–10.5)

## 2016-11-06 LAB — MRSA PCR SCREENING: MRSA BY PCR: NEGATIVE

## 2016-11-06 LAB — TSH: TSH: 1.427 u[IU]/mL (ref 0.350–4.500)

## 2016-11-06 LAB — ECHOCARDIOGRAM COMPLETE
HEIGHTINCHES: 72 in
Weight: 4836.01 oz

## 2016-11-06 LAB — HIV ANTIBODY (ROUTINE TESTING W REFLEX): HIV Screen 4th Generation wRfx: NONREACTIVE

## 2016-11-06 MED ORDER — PERFLUTREN LIPID MICROSPHERE
1.0000 mL | INTRAVENOUS | Status: DC | PRN
  Filled 2016-11-06: qty 10

## 2016-11-06 MED ORDER — FUROSEMIDE 10 MG/ML IJ SOLN
160.0000 mg | Freq: Once | INTRAVENOUS | Status: DC
  Filled 2016-11-06: qty 16

## 2016-11-06 MED ORDER — NITROGLYCERIN IN D5W 200-5 MCG/ML-% IV SOLN
5.0000 ug/min | INTRAVENOUS | Status: DC
  Administered 2016-11-06: 5 ug/min via INTRAVENOUS
  Filled 2016-11-06: qty 250

## 2016-11-06 MED ORDER — CHLORHEXIDINE GLUCONATE 0.12 % MT SOLN
15.0000 mL | Freq: Two times a day (BID) | OROMUCOSAL | Status: DC
  Administered 2016-11-06 – 2016-11-17 (×16): 15 mL via OROMUCOSAL
  Filled 2016-11-06 (×15): qty 15

## 2016-11-06 MED ORDER — FUROSEMIDE 10 MG/ML IJ SOLN
80.0000 mg | Freq: Once | INTRAMUSCULAR | Status: AC
  Administered 2016-11-06: 80 mg via INTRAVENOUS
  Filled 2016-11-06: qty 8

## 2016-11-06 MED ORDER — ORAL CARE MOUTH RINSE
15.0000 mL | Freq: Two times a day (BID) | OROMUCOSAL | Status: DC
Start: 2016-11-07 — End: 2016-11-17
  Administered 2016-11-09 – 2016-11-16 (×10): 15 mL via OROMUCOSAL

## 2016-11-06 MED ORDER — FUROSEMIDE 10 MG/ML IJ SOLN
10.0000 mg/h | INTRAVENOUS | Status: DC
  Administered 2016-11-06 – 2016-11-10 (×5): 10 mg/h via INTRAVENOUS
  Filled 2016-11-06 (×6): qty 25
  Filled 2016-11-06 (×3): qty 20
  Filled 2016-11-06: qty 25

## 2016-11-06 MED ORDER — FUROSEMIDE 10 MG/ML IJ SOLN: 40.0000 mg | Freq: Three times a day (TID) | INTRAMUSCULAR | Status: DC

## 2016-11-06 NOTE — Consult Note (Signed)
Hunter [name]    Date of visit:  11/04/2014 DOB:  15-Oct-1970    Age:  46 yrs. Medical record number:  [id number]     Account number:  [id number] Primary Care Levana Minetti: [facility] ____________________________ CURRENT DIAGNOSES  1. Dyspnea  2. Proteinuria, Unspecified  3. Secondary Polycythemia  4. Obesity  5. Hypertensive heart disease without heart failure  6. Hyperlipidemia ____________________________ ALLERGIES  No Known Allergies ____________________________ MEDICATIONS  1. Fish Oil 300 mg-1,000 mg capsule, 1 p.o. daily  2. lisinopril 10 mg tablet, 1 p.o. daily  3. Crestor 10 mg tablet, 1 p.o. daily  4. furosemide 20 mg tablet, 2 p.o. daily ____________________________ CHIEF COMPLAINTS  Followup of Dyspnea ____________________________ HISTORY OF PRESENT ILLNESS This 46 year old black male is seen at the request of [facility] for evaluation of dyspnea fatigue and abnormal weight gain. The patient is a somewhat difficult historian. He evidently had sarcoidosis and was treated at [facility] with prednisone a number of years ago but cease taking prednisone and has not had any followup in 4 or 5 years. He recently presented in May with complaints of lower extremity swelling, increasing shortness of breath. He was started on treatment with lisinopril and HCTZ and was prescribed Lipitor for severe hyperlipidemia. He had abnormal lab tests with proteinuria as well as low albumin and was seen in followup by Dr. Zachery Dauer a month ago. He continued to smoke and evidently was given a referral to pulmonology in [place] but did not the appointment. He was also given a referral to rheumatology which she also did not keep. He was given a referral to [facility] in August for cardiovascular disease but did not keep the appointment and had an abnormal chest x-ray at that time. He was seen back in followup with edema and was placed on lisinopril as well as Lasix. He  comes in to see me today. His complaint is that of significant dyspnea with exertion. 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 He does not have any anginal type. He does have lower extremity edema and is also noted significant weight gain that would happen early. He denies PND, orthopnea, but notes that he has very heavy legs and has difficulty climbing stairs or walking more than one block. He has not had any recent cardiovascular workup. He is evidently been severely obese for a number of years. Lab work reviewed showed previous hypoalbuminemia and proteinuria and he also had polycythemia noted.  ____________________________ PAST HISTORY  Past Medical Illnesses:  hypertension, obesity, sarcoidosis, hyperlipidemia;  Cardiovascular Illnesses:  no previous history of cardiac disease;  Surgical Procedures:  no previous surgical procedures;  Cardiology Procedures-Invasive:  no previous interventional or invasive cardiology procedures;  Cardiology Procedures-Noninvasive:  no previous non-invasive cardiovascular testing;  LVEF not documented,   ____________________________ CARDIO-PULMONARY TEST DATES EKG Date:  11/04/2014;  Chest Xray Date: 10/04/2014;   ____________________________ FAMILY HISTORY Father -- Father dead, Death of unknown cause Mother -- Mother dead, Death of unknown cause Sister -- Sister alive with problem, Asthma ____________________________ SOCIAL HISTORY Alcohol Use:  occasionally;  Smoking:  smokes less than 1 ppd, 5 pack year history;  Diet:  regular diet;  Lifestyle:  single;  Exercise:  no regular exercise;  Occupation:  Programmer, systems;  Residence:  lives alone;   ____________________________ REVIEW OF SYSTEMS General:  obesity, weight gain, fatgiue Integumentary: prior skin rash with sarcoid. Eyes: denies diplopia, history of glaucoma or visual problems. Ears, Nose, Throat, Mouth:  denies any hearing loss, epistaxis, hoarseness or difficulty speaking.  Respiratory: dyspnea with exertion. Cardiovascular:  please review HPI Abdominal: denies dyspepsia, GI bleeding, constipation, or diarrhea Genitourinary-Male: no dysuria, urgency, frequency, or nocturia  Musculoskeletal:  Weakness in legs  Neurological:  denies headaches, stroke, or TIA Psychiatric:  denies depression or anxiety Hematological/Immunologic:  denies any food allergies, bleeding disorders. ____________________________ PHYSICAL EXAMINATION VITAL SIGNS  Blood Pressure:  142/100 Sitting, Left arm, large cuff  , 150/110 Standing, Left arm and large cuff   Pulse:  100/min. Respirations:  40/min. Weight:  288.00 lbs. Height:  73"BMI: 38  Constitutional:  pleasant African Americian male in no acute distress, severely obese Skin:  striae over arms Head:  normocephalic, normal hair pattern, no masses or tenderness Eyes:  EOMS Intact, PERRLA, C and S clear, Funduscopic exam not done. ENT:  ears, nose and throat reveal no gross abnormalities.  Dentition good. Neck:  supple, without massess. No JVD, thyromegaly or carotid bruits. Carotid upstroke normal. Chest:  normal symmetry, clear to auscultation. Cardiac:  regular rhythm, normal S1, no S3 or S4, loud P2 component of S2 Abdomen:  abdomen soft,non-tender, no masses, no hepatospenomegaly, or aneurysm noted Peripheral Pulses:  the femoral,dorsalis pedis, and posterior tibial pulses are full and equal bilaterally with no bruits auscultated. Extremities & Back:  2+ edema Neurological:  no gross motor or sensory deficits noted, affect appropriate, oriented x3. ____________________________ IMPRESSIONS/PLAN  1. Progressive shortness of breath with a markedly abnormal EKG, oxygen saturation, polycythemia and evidence of congestive heart failure. 2. Hypertensive heart disease with uncontrolled blood pressure 3. Polycythemia 4. Severe obesity 5. Hypoxemia 6. Proteinuria with low albumin 7. Severe hypercholesterolemia  Recommendations:  The  clinical constellation of symptoms could be consistent with significant right heart failure or a combined cardiomyopathy. . His EKG is abnormal with right axis deviation and what appears to be right atrial enlargement and poor wave progression. He was somewhat argumentative during the history and has missed several recent appointments.  My recommendations would be to initiate evaluation with an echocardiogram and also like him to have a CT scan with contrast to exclude pulmonary emboli as well as to evaluate for sarcoidosis that could be residual. I recommended lab work as noted below and also recommended a 24 urine for protein. He is quite complex and not concerned that he may either have residual sarcoidosis or could have pulmonary hypertension as these would seem to be findings on examination. ____________________________ TODAYS ORDERS  1. Comprehensive Metabolic Panel: Today  2. TSH: Today  3. Complete Blood Count: Today  4. BNP: Today  5. D-dimer: today  6. 24 hour urine for protein/CR: 0  7. 2D, color flow, doppler: First Available  8. CT Chest w/ Contrast: At Patient Convenience  9. Basic Metabolic Panel: at pt. conv  10. Chest X-ray PA/Lat: today  11. Return Visit: 2 weeks  12. 12 Lead EKG: Today                       ____________________________ Cardiology Physician:  Darden Palmer MD [facility]  Patient returns for followup. He was not charged for the visit today. We have been trying to get him to have a CT scan of his chest but we have had a difficult time getting him to understand this as he thought he guarded he had this when he had a chest x-ray. He now has significant pulmonary hypertension on echo with septal flattening and also has severe  proteinuria. I discussed these diagnoses with him. He was argumentative and also stated that he really was feeling fine without a lot of other treatment. At some point he was taken off of prednisone and may have done this voluntarily  himself. I explained to him that the pulmonary hypertension and nephrotic syndrome could be life-threatening diseases and that he needed to have further followup and evaluation for each of them. He seems to be focused on treatment only. We will try to schedule the CT scan of his chest and also will do a nephrology referral for the proteinuria and nephrotic syndrome.    Darden Palmer MD [facility]

## 2016-11-06 NOTE — Progress Notes (Addendum)
PROGRESS NOTE    Hunter [name]  AVW:[id number] DOB: Mar 21, 1970 DOA: 11/05/2016 PCP: Bing Neighbors, FNP     Brief Narrative:  Hunter [name] is a 46 y.o. male with medical history significant of sarcoidosis, chronic hypoxic respiratory failure, hypertension, FSGS, CKD stage III. He is a very poor historian. He states that he has been following with nephrology and was on Lasix twice a day. This was recently changed in the past few days by his new primary care physician. He states that since his medications have been changed, he noted decrease in urine output, worsening shortness of breath as well as increased swelling of his abdomen and lower extremities. He is very upset his primary care physician on why his medications have been changed. On chart review of his office records on 9/25, it appears that patient had already been complaining of abdominal bloating for a week at that time despite taking furosemide. His primary care physician had changed his medication from furosemide to torsemide. He is unsure what his kidney diseases from, unsure if he has ever been diagnosed with heart failure. He is supposed to be on home oxygen at baseline, but due to being at a homeless shelter, has not been using it in the past month. Labs were obtained which revealed BNP 1019. Chest x-ray revealed findings consistent with congestive heart failure.   Assessment & Plan:   Active Problems:   CHF (congestive heart failure) (HCC)   Edema -Right heart failure + nephrotic syndrome  -BNP elevated 1019, with chest x-ray findings of cardiomegaly, right pleural effusion with abdominal distention and lower extremity edema  -Echocardiogram ordered -Lasix IV -Nitro gtt for preload and afterload reduction -Cardiology consult -Daily weight, I/Os   Acute on chronic hypoxemic and hypercapnic respiratory failure -Has been inconsistently using his home oxygen due to residing at a homeless shelter currently -Patient was  found to be very somnolent this morning on exam. ABG obtained with pH 7.09, pCO2 114. Bipap ordered. Will consult PCCM. If patient does not tolerate Bipap, he may require intubation. He has been very agitated overnight per nursing report.   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  -Baseline Cr 0.92 in 09/13/2016   -Follows with Dr. Kathrene Bongo as outpatient. Nephrology consult   Hyperkalemia -Lasix IV  -Monitor BMP  HLD -Continue lipitor   History of sarcoidosis -Has followed with pulmonology in the past, now off prednisone  Morbid obesity -Body mass index is 41.09 kg/m.   Homelessness -Consult social work  Possible sleep apnea -Has outpatient sleep study scheduled   DVT prophylaxis: subq hep Code Status: full Family Communication: no family at bedside Disposition Plan: pending improvement   Consultants:   Cardiology  Nephrology  PCCM  Procedures:   None  Antimicrobials:  Anti-infectives    None       Subjective: Patient very somnolent and difficult to awake. States he is at [facility], denies complaints but does not stay awake enough for full interview   Objective: Vitals:   11/06/16 0400 11/06/16 0500 11/06/16 0601 11/06/16 0839  BP: (!) 106/56 106/68 121/63   Pulse: 86 86 83   Resp: 20 (!) 25    Temp:    (!) 97.4 F (36.3 C)  TempSrc:      SpO2: 95% 94%    Weight:      Height:  Intake/Output Summary (Last 24 hours) at 11/06/16 0855 Last data filed at 11/06/16 0506  Gross per 24 hour  Intake           212.73 ml  Output              700 ml  Net          -487.27 ml   Filed Weights   11/06/16 0119 11/06/16 0121 11/06/16 0352  Weight: (!) 137.5 kg (303 lb 2.1 oz) (!) 137.5 kg (303 lb 2.1 oz) (!) 137.1 kg (302 lb 4 oz)    Examination:    General exam: Appears calm and comfortable, somnolent  Respiratory system: Clear to auscultation anteriorly. Respiratory effort normal. Cardiovascular system: S1 & S2 heard, RRR. No JVD, murmurs, rubs, gallops or clicks. +1 pitting edema with abdominal distention Gastrointestinal system: Abdomen is +distended, soft and nontender. No organomegaly or masses felt. Normal bowel sounds heard. Central nervous system: Somnolent, difficult to arouse  Extremities: Symmetric in appearance  Skin: No rashes, lesions or ulcers  Data Reviewed: I have personally reviewed following labs and imaging studies  CBC:  Recent Labs Lab 11/02/16 0841 11/06/16 0324  WBC 5.2 6.1  HGB 15.5 14.4  HCT 52.2* 51.1  MCV 86.0 89.3  PLT 152 133*   Basic Metabolic Panel:  Recent Labs Lab 11/02/16 0841 11/05/16 1133 11/05/16 1414 11/05/16 1420 11/06/16 0324  NA 139 142 143  --  143  K 5.0 5.0 5.7*  --  5.2*  CL 100* 106 103  --  104  CO2 31 28 33*  --  33*  GLUCOSE 94 124* 95  --  102*  BUN 58* 46* 47*  --  45*  CREATININE 1.83* 1.48* 1.52*  --  1.62*  CALCIUM 7.9* 8.0* 8.0*  --  7.7*  MG  --   --   --  2.1  --    GFR: Estimated Creatinine Clearance: 80 mL/min (A) (by C-G formula based on SCr of 1.62 mg/dL (H)). Liver Function Tests:  Recent Labs Lab 11/02/16 0841 11/05/16 1133  AST 36 37  ALT 38 35  ALKPHOS 124 125  BILITOT 1.1 0.9  PROT 6.1* 6.5  ALBUMIN 2.7* 3.0*   No results for input(s): LIPASE, AMYLASE in the last 168 hours. No results for input(s): AMMONIA in the last 168 hours. Coagulation Profile: No results for input(s): INR, PROTIME in the last 168 hours. Cardiac Enzymes: No results for input(s): CKTOTAL, CKMB, CKMBINDEX, TROPONINI in the last 168 hours. BNP (last 3 results) No results for input(s): PROBNP in the last 8760 hours. HbA1C: No results for input(s): HGBA1C in the last 72 hours. CBG: No results for input(s): GLUCAP in the last 168 hours. Lipid Profile: No  results for input(s): CHOL, HDL, LDLCALC, TRIG, CHOLHDL, LDLDIRECT in the last 72 hours. Thyroid Function Tests: No results for input(s): TSH, T4TOTAL, FREET4, T3FREE, THYROIDAB in the last 72 hours. Anemia Panel: No results for input(s): VITAMINB12, FOLATE, FERRITIN, TIBC, IRON, RETICCTPCT in the last 72 hours. Sepsis Labs: No results for input(s): PROCALCITON, LATICACIDVEN in the last 168 hours.  Recent Results (from the past 240 hour(s))  MRSA PCR Screening     Status: None   Collection Time: 11/06/16  1:40 AM  Result Value Ref Range Status   MRSA by PCR NEGATIVE NEGATIVE Final    Comment:        The GeneXpert MRSA Assay (FDA approved for NASAL specimens only), is one component of a comprehensive MRSA  colonization surveillance program. It is not intended to diagnose MRSA infection nor to guide or monitor treatment for MRSA infections.        Radiology Studies: Dg Chest 2 View  Result Date: 11/05/2016 CLINICAL DATA:  Shortness of Breath EXAM: CHEST  2 VIEW COMPARISON:  February 18, 2016 FINDINGS: There is cardiomegaly with pulmonary venous hypertension. There is a stable focal right pleural effusion. There is interstitial edema. There is patchy airspace opacity in the left lower lobe. No adenopathy. No evident bone lesions. IMPRESSION: Findings consistent with congestive heart failure. Question alveolar edema versus patchy pneumonia left lower lobe. Both entities may exist concurrently. Electronically Signed   By: Bretta Bang III M.D.   On: 11/05/2016 14:26      Scheduled Meds: . atorvastatin  20 mg Oral Daily  . furosemide  40 mg Intravenous TID  . heparin  5,000 Units Subcutaneous Q8H  . hydrOXYzine  100 mg Oral Daily   Continuous Infusions:   LOS: 1 day    Time spent: 40 minutes   Noralee Stain, DO [facility] [email or web address] Password [email or web address] 11/06/2016, 8:55 AM

## 2016-11-06 NOTE — Consult Note (Signed)
Name: Hunter [name] MRN: [id number] DOB: November 12, 1970    ADMISSION DATE:  11/05/2016 CONSULTATION DATE:  11/06/16  REFERRING MD :  Dr Alvino Chapel  CHIEF COMPLAINT:  Acute hypercapnic respiratory failure  BRIEF PATIENT DESCRIPTION: 46 year old obese male with hypercapnic respiratory failure  SIGNIFICANT EVENTS  9/28 admission 9/29 hypercapnia and critical care consult  STUDIES:  9/29 echocardiogram results pending  HISTORY OF PRESENT ILLNESS:   46 year old male with incomplete past medical history. From the medical record I gather he has sarcoidosis diagnosed with skin biopsy in 2002 office steroids in 2007. 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 He had PFTs consistent with severe restrictive pathology. To be due to obesity hypoventilation syndrome. He is further believed to have obstructive sleep apnea. He was admitted one day ago to the medical service after presenting with shortness of breath and peripheral edema. He was found to have a BNP of 1019 and a chest x-ray consistent with pulmonary edema and pleural effusions. This morning he was noted to be somnolent and found to have hypercapnic respiratory acidosis. He was started on BiPAP and critical care was consult. Foley catheter is placed and 175 mL of clear yellow urine returned  PAST MEDICAL HISTORY :   has a past medical history of Hypertension and Sarcoid.  has no past surgical history on file. Prior to Admission medications   Medication Sig Start Date End Date Taking? Authorizing Provider  atorvastatin (LIPITOR) 20 MG tablet Take 1 tablet (20 mg total) by mouth daily. 10/26/16  Yes Bing Neighbors, FNP  hydrOXYzine (VISTARIL) 100 MG capsule Take 1 capsule (100 mg total) by mouth daily. 10/26/16  Yes Bing Neighbors, FNP  torsemide (DEMADEX) 20 MG tablet Take 1 tablet (20 mg total) by mouth daily. 11/02/16  Yes Bing Neighbors, FNP  furosemide (LASIX) 40 MG tablet Take 1 tablet (40 mg total) by  mouth daily. Patient not taking: Reported on 11/05/2016 10/26/16   Bing Neighbors, FNP  lisinopril (PRINIVIL,ZESTRIL) 20 MG tablet Take 1 tablet (20 mg total) by mouth daily. Patient not taking: Reported on 11/05/2016 11/02/16   Bing Neighbors, FNP   Allergies  Allergen Reactions  . Thalidomide Other (See Comments)    Derm,resp    FAMILY HISTORY:  Family history is unknown by patient. SOCIAL HISTORY:  reports that he has been smoking Cigarettes.  He has a 5.00 pack-year smoking history. He has never used smokeless tobacco. He reports that he drinks about 1.8 oz of alcohol per week .  REVIEW OF SYSTEMS:   Constitutional: Negative for fever, chills, weight loss, malaise/fatigue and diaphoresis.  HENT: Negative for hearing loss, ear pain, nosebleeds, congestion, sore throat, neck pain, tinnitus and ear discharge.   Eyes: Negative for blurred vision, double vision, photophobia, pain, discharge and redness.  Respiratory: Negative for cough, hemoptysis, sputum production, shortness of breath, wheezing and stridor.   Cardiovascular: Negative for chest pain, palpitations, orthopnea, claudication, leg swelling and PND.  Gastrointestinal: Negative for heartburn, nausea, vomiting, abdominal pain, diarrhea, constipation, blood in stool and melena.  Genitourinary: Negative for dysuria, urgency, frequency, hematuria and flank pain.  Musculoskeletal: Negative for myalgias, back pain, joint pain and falls.  Skin: Negative for itching and rash.  Neurological: Negative for dizziness, tingling, tremors, sensory change, speech change, focal weakness, seizures, loss of consciousness, weakness and headaches.  Endo/Heme/Allergies: Negative for environmental allergies and polydipsia. Does not bruise/bleed easily.  SUBJECTIVE:   VITAL SIGNS: Temp:  [97.3 F (36.3  C)-98.1 F (36.7 C)] 97.4 F (36.3 C) (09/29 0839) Pulse Rate:  [70-94] 70 (09/29 0900) Resp:  [13-30] 15 (09/29 0900) BP:  (106-154)/(50-109) 138/80 (09/29 0900) SpO2:  [88 %-100 %] 90 % (09/29 0900) FiO2 (%):  [30 %] 30 % (09/29 0856) Weight:  [302 lb 4 oz (137.1 kg)-303 lb 2.1 oz (137.5 kg)] 302 lb 4 oz (137.1 kg) (09/29 0352)  PHYSICAL EXAMINATION: General:   obese, Neuro:   GCS 13 HEENT:   atraumatic normocephalic, EOMI, PERRLA, moist mucous membranes Cardiovascular:   regular rate and rhythm bilateral edema Lungs:   diminished, rales, wheezing Abdomen:   protuberant positive bowel sounds soft nontender nondistended Musculoskeletal:   no red warm swollen tender joints Skin:   warm, dry, no rash   Recent Labs Lab 11/05/16 1133 11/05/16 1414 11/06/16 0324  NA 142 143 143  K 5.0 5.7* 5.2*  CL 106 103 104  CO2 28 33* 33*  BUN 46* 47* 45*  CREATININE 1.48* 1.52* 1.62*  GLUCOSE 124* 95 102*    Recent Labs Lab 11/02/16 0841 11/06/16 0324  HGB 15.5 14.4  HCT 52.2* 51.1  WBC 5.2 6.1  PLT 152 133*   Dg Chest 2 View  Result Date: 11/05/2016 CLINICAL DATA:  Shortness of Breath EXAM: CHEST  2 VIEW COMPARISON:  February 18, 2016 FINDINGS: There is cardiomegaly with pulmonary venous hypertension. There is a stable focal right pleural effusion. There is interstitial edema. There is patchy airspace opacity in the left lower lobe. No adenopathy. No evident bone lesions. IMPRESSION: Findings consistent with congestive heart failure. Question alveolar edema versus patchy pneumonia left lower lobe. Both entities may exist concurrently. Electronically Signed   By: Bretta Bang III M.D.   On: 11/05/2016 14:26    ASSESSMENT / PLAN:   acute hypercapnic respiratory failure with respiratory acidosis is likely due to OHS, OSA, pulmonary edema. He appears to have improved based on the exam that was described to me and his present exam. Plan to continue with noninvasive ventilation repeat ABG after repeat dose of Lasix 80 mg IV and starting Lasix drip. Should patient decompensate or fail to improve on above  measures he may need invasive mechanical ventilation.  Acute decompensated heart failure per cardiology  History of CKD, FSGS per primary team  Upon my evaluation, this patient had a high probability of imminent or life-threatening deterioration due to impairments and cardiac respiratory CNS and nephrology systems  high complexity decision making to assess, manipulate, and support vital organ system failure incnoninvasive mechanical ventilation  I have personally provided 45 minutes of critical care time exclusive of time spent on separately billable procedures and education. Time includes review and summation of previous medical record, laboratory data, radiology results, independent review of CXR, coordination of care with RN, and monitoring for potential decompensation. Interventions were performed as documented above.  Condition: critical Prognosis: guarded Code Status: full  Thank you for this consult, we will continue follow with you.  Caro Laroche, MD  Pulmonary and Critical Care Medicine [facility] Pager: [phone]  11/06/2016, 10:55 AM

## 2016-11-06 NOTE — ED Notes (Signed)
Pt resting comfortably

## 2016-11-06 NOTE — Consult Note (Signed)
Primary cardiologist: Dr Donnie Aho Consulting cardiologist: Dr Dina Rich MD Requesting physician: Dr Alvino Chapel Indication: SOB  Clinical Summary Hunter [name] is a 46 y.o.male history of sarcoidosis, HTN, CKD III, chronic hypoxic resp failure supposed to be on home O2, homelessnes,admitted with increased SOB, abdominal distension, and LE edema. PCP recenlty changed his furosemide to torsemide as outpatient without improvement in symptoms. He denies any significant chest pain. He reports compliance with medications. Patient has seen Dr Donnie Aho in cardiology clinic previously, however has not been compliant with appointments.    K 5, Cr 1.48, BNP 1034, Mg 2.2, WBC 6.1, Hgb 14.4, Plt 133 Trop neg x 1 CXR +pulm edema EKG SR, incomplete RBBB, lateral Qwaves Echo pending Allergies  Allergen Reactions  . Thalidomide Other (See Comments)    Derm,resp    Medications Scheduled Medications: . atorvastatin  20 mg Oral Daily  . furosemide  40 mg Intravenous BID  . heparin  5,000 Units Subcutaneous Q8H  . hydrOXYzine  100 mg Oral Daily     Infusions:   PRN Medications:  acetaminophen **OR** acetaminophen, ondansetron **OR** ondansetron (ZOFRAN) IV, polyethylene glycol   Past Medical History:  Diagnosis Date  . Hypertension   . Sarcoid     History reviewed. No pertinent surgical history.  Family History  Problem Relation Age of Onset  . Family history unknown: Yes    Social History Mr. Bunte reports that he has been smoking Cigarettes.  He has a 5.00 pack-year smoking history. He has never used smokeless tobacco. Mr. Dacy reports that he drinks about 1.8 oz of alcohol per week .  Review of Systems CONSTITUTIONAL: No weight loss, fever, chills, weakness or fatigue.  HEENT: Eyes: No visual loss, blurred vision, double vision or yellow sclerae. No hearing loss, sneezing, congestion, runny nose or sore throat.  SKIN: No rash or itching.  CARDIOVASCULAR: No chest pain, chest  pressure or chest discomfort. No palpitations or edema.  RESPIRATORY: No shortness of breath, cough or sputum.  GASTROINTESTINAL: No anorexia, nausea, vomiting or diarrhea. No abdominal pain or blood.  GENITOURINARY: no polyuria, no dysuria NEUROLOGICAL: No headache, dizziness, syncope, paralysis, ataxia, numbness or tingling in the extremities. No change in bowel or bladder control.  MUSCULOSKELETAL: No muscle, back pain, joint pain or stiffness.  HEMATOLOGIC: No anemia, bleeding or bruising.  LYMPHATICS: No enlarged nodes. No history of splenectomy.  PSYCHIATRIC: No history of depression or anxiety.      Physical Examination Blood pressure 121/63, pulse 83, temperature (!) 97.3 F (36.3 C), temperature source Axillary, resp. rate (!) 25, height 6' (1.829 m), weight (!) 302 lb 4 oz (137.1 kg), SpO2 94 %.  Intake/Output Summary (Last 24 hours) at 11/06/16 0734 Last data filed at 11/06/16 0506  Gross per 24 hour  Intake           212.73 ml  Output              700 ml  Net          -487.27 ml    HEENT: sclera clear, throat clear  Cardiovascular: RRR, no m/r/g, JVD is elevated  Respiratory: crackles bilateral bases  GI: abdomen soft, NT, ND  MSK: trace to 1+ bilateral edema  Neuro: no focal deficits  Psych: appropriate affect   Lab Results  Basic Metabolic Panel:  Recent Labs Lab 11/02/16 0841 11/05/16 1133 11/05/16 1414 11/05/16 1420 11/06/16 0324  NA 139 142 143  --  143  K 5.0 5.0 5.7*  --  5.2*  CL 100* 106 103  --  104  CO2 31 28 33*  --  33*  GLUCOSE 94 124* 95  --  102*  BUN 58* 46* 47*  --  45*  CREATININE 1.83* 1.48* 1.52*  --  1.62*  CALCIUM 7.9* 8.0* 8.0*  --  7.7*  MG  --   --   --  2.1  --     Liver Function Tests:  Recent Labs Lab 11/02/16 0841 11/05/16 1133  AST 36 37  ALT 38 35  ALKPHOS 124 125  BILITOT 1.1 0.9  PROT 6.1* 6.5  ALBUMIN 2.7* 3.0*    CBC:  Recent Labs Lab 11/02/16 0841 11/06/16 0324  WBC 5.2 6.1  HGB 15.5  14.4  HCT 52.2* 51.1  MCV 86.0 89.3  PLT 152 133*    Cardiac Enzymes: No results for input(s): CKTOTAL, CKMB, CKMBINDEX, TROPONINI in the last 168 hours.  BNP: Invalid input(s): POCBNP      Impression/Recommendations 1. Acute congestive heart failure - presents with significant volume overload. The etiology of his heart failure is unknown at this time, echo is pending - negative 487 since admission, he is on lasix  bid IV, based on MAR first IV dose of lasix is for this AM. Change lasix to tid dosing -has been started on NG drip, continue for preload and afterload reduction in setting of HTN and pulmonary edema.  - further medical recs pending echo results - discussed with Dr Donnie Aho who has some memory of patient and will post his most recent clinic note. Patient poorly compliant with appointments, there has been concern for possible cor pulmonale, consideration for possible cardiac sarcoid however management and workup has been limited due to patients poor f/u.   2. Nephrotic syndrome - followed by nephrology      Dina Rich, M.D

## 2016-11-06 NOTE — Progress Notes (Signed)
Dr.Laurence was paged to give ABG results. MD never called back rn aware. RN paged Dr. Alvino Chapel with results.

## 2016-11-06 NOTE — Consult Note (Signed)
[facility] Renal Consultation Note  Requesting MD: Maylene Roes Indication for Consultation: AKI and volume overload- history of nephrotic syndrome and FSG  HPI:  Hunter [name] is a 46 y.o. male with past medical history significant for sarcoidosis, hypertension, chronic hypoxic respiratory failure as well as poor insight and being a poor historian. Patient has been followed by me or nephrotic syndrome. Kidney biopsy revealed FSG. I attempted to treat him with steroids, prograf and most recently Acthar.  He only achieved partial remission with those agents. Because he had some inability to follow up as an outpatient I decided to not give him disease specific therapy and just try an ACE inhibitor. I saw him last on August 6. His creatinine was 0.9. His protein creatinine ratio was in the 4000's (which was better) and albumin was 3.5. I had him on Lasix 160 mg twice a day to control his volume and his weight was 274.  He was on  lisinopril 20 mg and I increased it to 40 mg at that visit. He got admitted to [facility] on 9/28 with complaints of shortness of breath, abdominal and lower extremity swelling. I was unaware he was living at a homeless shelter.  Diuresis has been attempted. He is at 1900 of urine out so far but weight is still greater than 300.  Now on on Lasix due to drip at 10 mg per hour.  His creatinine is elevated from his baseline, most recently 1.6.  His blood pressure is actually low for him- not sure when he took his lisinopril last.  He is currently on BiPAP for hypercarbia. He had an echo which revealed a good ejection fraction  Creatinine, Ser  Date/Time Value Ref Range Status  11/06/2016 03:24 AM 1.62 (H) 0.61 - 1.24 mg/dL Final  11/05/2016 02:14 PM 1.52 (H) 0.61 - 1.24 mg/dL Final  11/05/2016 11:33 AM 1.48 (H) 0.61 - 1.24 mg/dL Final  11/02/2016 08:41 AM 1.83 (H) 0.61 - 1.24 mg/dL Final     PMHx:   Past Medical History:  Diagnosis Date  . Hypertension   . Sarcoid      History reviewed. No pertinent surgical history.  Family Hx:  Family History  Problem Relation Age of Onset  . Family history unknown: Yes    Social History:  reports that he has been smoking Cigarettes.  He has a 5.00 pack-year smoking history. He has never used smokeless tobacco. He reports that he drinks about 1.8 oz of alcohol per week . His drug history is not on file.  Allergies:  Allergies  Allergen Reactions  . Thalidomide Other (See Comments)    Derm,resp    Medications: Prior to Admission medications   Medication Sig Start Date End Date Taking? Authorizing Provider  atorvastatin (LIPITOR) 20 MG tablet Take 1 tablet (20 mg total) by mouth daily. 10/26/16  Yes Scot Jun, FNP  hydrOXYzine (VISTARIL) 100 MG capsule Take 1 capsule (100 mg total) by mouth daily. 10/26/16  Yes Scot Jun, FNP  torsemide (DEMADEX) 20 MG tablet Take 1 tablet (20 mg total) by mouth daily. 11/02/16  Yes Scot Jun, FNP  furosemide (LASIX) 40 MG tablet Take 1 tablet (40 mg total) by mouth daily. Patient not taking: Reported on 11/05/2016 10/26/16   Scot Jun, FNP  lisinopril (PRINIVIL,ZESTRIL) 20 MG tablet Take 1 tablet (20 mg total) by mouth daily. Patient not taking: Reported on 11/05/2016 11/02/16   Scot Jun, FNP    I have reviewed  the patient's current medications.  Labs:  Results for orders placed or performed during the hospital encounter of 11/05/16 (from the past 48 hour(s))  Basic metabolic panel     Status: Abnormal   Collection Time: 11/05/16  2:14 PM  Result Value Ref Range   Sodium 143 135 - 145 mmol/L   Potassium 5.7 (H) 3.5 - 5.1 mmol/L    Comment: SLIGHT HEMOLYSIS   Chloride 103 101 - 111 mmol/L   CO2 33 (H) 22 - 32 mmol/L   Glucose, Bld 95 65 - 99 mg/dL   BUN 47 (H) 6 - 20 mg/dL   Creatinine, Ser 1.52 (H) 0.61 - 1.24 mg/dL   Calcium 8.0 (L) 8.9 - 10.3 mg/dL   GFR calc non Af Amer 53 (L) >60 mL/min   GFR calc Af Amer >60 >60 mL/min     Comment: (NOTE) The eGFR has been calculated using the CKD EPI equation. This calculation has not been validated in all clinical situations. eGFR's persistently <60 mL/min signify possible Chronic Kidney Disease.    Anion gap 7 5 - 15  Brain natriuretic peptide     Status: Abnormal   Collection Time: 11/05/16  2:14 PM  Result Value Ref Range   B Natriuretic Peptide 1,019.5 (H) 0.0 - 100.0 pg/mL  I-stat troponin, ED     Status: None   Collection Time: 11/05/16  2:20 PM  Result Value Ref Range   Troponin i, poc 0.00 0.00 - 0.08 ng/mL   Comment 3            Comment: Due to the release kinetics of cTnI, a negative result within the first hours of the onset of symptoms does not rule out myocardial infarction with certainty. If myocardial infarction is still suspected, repeat the test at appropriate intervals.   Magnesium     Status: None   Collection Time: 11/05/16  2:20 PM  Result Value Ref Range   Magnesium 2.1 1.7 - 2.4 mg/dL  MRSA PCR Screening     Status: None   Collection Time: 11/06/16  1:40 AM  Result Value Ref Range   MRSA by PCR NEGATIVE NEGATIVE    Comment:        The GeneXpert MRSA Assay (FDA approved for NASAL specimens only), is one component of a comprehensive MRSA colonization surveillance program. It is not intended to diagnose MRSA infection nor to guide or monitor treatment for MRSA infections.   HIV antibody (Routine Testing)     Status: None   Collection Time: 11/06/16  3:24 AM  Result Value Ref Range   HIV Screen 4th Generation wRfx Non Reactive Non Reactive    Comment: (NOTE) Performed At: [facility] [place], [place] [id number] Lindon Romp MD DH:[id number]   CBC     Status: Abnormal   Collection Time: 11/06/16  3:24 AM  Result Value Ref Range   WBC 6.1 4.0 - 10.5 K/uL   RBC 5.72 4.22 - 5.81 MIL/uL   Hemoglobin 14.4 13.0 - 17.0 g/dL   HCT 51.1 39.0 - 52.0 %   MCV 89.3 78.0 - 100.0 fL   MCH 25.2 (L) 26.0 -  34.0 pg   MCHC 28.2 (L) 30.0 - 36.0 g/dL   RDW 18.8 (H) 11.5 - 15.5 %   Platelets 133 (L) 150 - 400 K/uL  Basic metabolic panel     Status: Abnormal   Collection Time: 11/06/16  3:24 AM  Result Value Ref Range   Sodium  143 135 - 145 mmol/L   Potassium 5.2 (H) 3.5 - 5.1 mmol/L   Chloride 104 101 - 111 mmol/L   CO2 33 (H) 22 - 32 mmol/L   Glucose, Bld 102 (H) 65 - 99 mg/dL   BUN 45 (H) 6 - 20 mg/dL   Creatinine, Ser 1.62 (H) 0.61 - 1.24 mg/dL   Calcium 7.7 (L) 8.9 - 10.3 mg/dL   GFR calc non Af Amer 49 (L) >60 mL/min   GFR calc Af Amer 56 (L) >60 mL/min    Comment: (NOTE) The eGFR has been calculated using the CKD EPI equation. This calculation has not been validated in all clinical situations. eGFR's persistently <60 mL/min signify possible Chronic Kidney Disease.    Anion gap 6 5 - 15  TSH     Status: None   Collection Time: 11/06/16  8:03 AM  Result Value Ref Range   TSH 1.427 0.350 - 4.500 uIU/mL    Comment: Performed by a 3rd Generation assay with a functional sensitivity of <=0.01 uIU/mL.  Blood gas, arterial     Status: Abnormal   Collection Time: 11/06/16  8:25 AM  Result Value Ref Range   O2 Content 2.0 L/min   Delivery systems NASAL CANNULA    pH, Arterial 7.088 (LL) 7.350 - 7.450    Comment: CRITICAL RESULT CALLED TO, READ BACK BY AND VERIFIED WITH: DR.CHOI,MD BY LISA CRADDOCK,RRT,RCP ON 11/06/16 AT 0835    pCO2 arterial 114 (HH) 32.0 - 48.0 mmHg    Comment: CRITICAL RESULT CALLED TO, READ BACK BY AND VERIFIED WITH: DR.CHOI,MD BY LISA CRADDOCK,RRT,RCO ON 11/06/16 AT 0835    pO2, Arterial 59.4 (L) 83.0 - 108.0 mmHg   Bicarbonate 32.8 (H) 20.0 - 28.0 mmol/L   Acid-base deficit 1.8 0.0 - 2.0 mmol/L   O2 Saturation 84.6 %   Patient temperature 98.6    Collection site RIGHT RADIAL    Drawn by [id number]    Sample type ARTERIAL DRAW    Allens test (pass/fail) PASS PASS  Blood gas, arterial     Status: Abnormal   Collection Time: 11/06/16 11:45 AM  Result Value Ref  Range   FIO2 30.00    Delivery systems BILEVEL POSITIVE AIRWAY PRESSURE    Inspiratory PAP 20    Expiratory PAP 5    pH, Arterial 7.204 (L) 7.350 - 7.450   pCO2 arterial 81.8 (HH) 32.0 - 48.0 mmHg    Comment: CRITICAL RESULT CALLED TO, READ BACK BY AND VERIFIED WITH: H.FRAIZER,RN AT 1155 BY LISA CRADDOCK,RRT,RCP ON 11/06/16    pO2, Arterial 59.0 (L) 83.0 - 108.0 mmHg   Bicarbonate 31.1 (H) 20.0 - 28.0 mmol/L   Acid-Base Excess 0.3 0.0 - 2.0 mmol/L   O2 Saturation 88.5 %   Patient temperature 98.6    Collection site RIGHT RADIAL    Drawn by [id number]    Sample type ARTERIAL DRAW    Allens test (pass/fail) PASS PASS  Blood gas, arterial     Status: Abnormal   Collection Time: 11/06/16  3:15 PM  Result Value Ref Range   FIO2 30.00    Delivery systems BILEVEL POSITIVE AIRWAY PRESSURE    Inspiratory PAP 20    Expiratory PAP 5    pH, Arterial 7.224 (L) 7.350 - 7.450   pCO2 arterial 77.8 (HH) 32.0 - 48.0 mmHg    Comment: CRITICAL RESULT CALLED TO, READ BACK BY AND VERIFIED WITH: DR.MANNAM,MD BY LISA CRADDOCK,RRT,RCP ON 11/06/16 AT 1525    pO2, Arterial  64.4 (L) 83.0 - 108.0 mmHg   Bicarbonate 31.0 (H) 20.0 - 28.0 mmol/L   Acid-Base Excess 0.8 0.0 - 2.0 mmol/L   O2 Saturation 90.7 %   Patient temperature 98.6    Collection site RIGHT RADIAL    Drawn by [id number]    Sample type ARTERIAL DRAW    Allens test (pass/fail) PASS PASS     ROS:  A comprehensive review of systems was negative except for: Respiratory: positive for dyspnea on exertion and Shortness of breath Cardiovascular: positive for lower extremity edema  Physical Exam: Vitals:   11/06/16 1600 11/06/16 1830  BP: 118/71 140/77  Pulse: 69 71  Resp: 19 15  Temp:    SpO2: 95% 94%     General: On BiPAP, "what is wrong with me Lambert Keto" "why am I quarantined" HEENT: Pupils are equally round and reactive to light, extra motions are intact, mucous members are moist Neck: Positive for JVD Heart: Regular rate and  rhythm Lungs: Coarse breath sounds bilaterally Abdomen: Distended, nontender Extremities: Pitting edema to bilateral legs to dependent areas Skin: Warm and dry Neuro: Slightly confused  Assessment/Plan: 46 year old black male with sarcoid, nephrotic syndrome- admitted with volume overload and hypoxic as well as hypercarbic respiratory failure 1.Renal- patient with known nephrotic syndrome secondary to FSG. Baseline creatinine is normal and was normal on August 6. Slightly elevated creatinine at this time possibly hemodynamic injury.  Will check UA. I agree with holding ACE inhibitor at this time. 2. Hypertension/volume  - 25+ pounds heavier than he was on August 6. He does seem to be responding to the Lasix drip with 900 out the last 8 hours.  Feel free to titrate it to affect. As an outpatient he was requiring 320 mg by mouth Lasix to control his volume status in the setting of nephrotic syndrome.  Not sure why he is on a nitro drip. I would discontinue it to avoid any hypotension 3. Pulmonary- sarcoid and probably OSA. Using BiPAP until his volume status is better 4. Anemia  - not an issue 5. Hyperkalemia- better- I imagine will continue to improve with diuresis   Sirenity Shew A 11/06/2016, 6:35 PM

## 2016-11-06 NOTE — Progress Notes (Signed)
Rt placed pt on BIPAP due to ABG.  

## 2016-11-06 NOTE — ED Notes (Signed)
Paged hospitalist regarding nitro drip.

## 2016-11-06 NOTE — Progress Notes (Signed)
  Echocardiogram 2D Echocardiogram has been performed.  Leta Jungling M 11/06/2016, 10:15 AM

## 2016-11-06 NOTE — Progress Notes (Addendum)
eLink Physician-Brief Progress Note Patient Name: Hunter [name] DOB: 04-Nov-1970 MRN: [id number]   Date of Service  11/06/2016  HPI/Events of Note  Pt more somnolent per nurse No resp distress on Bipap Last ABG showed improving hypercarbia  eICU Interventions  Check ABG  Addendum: ABG looks better, mental status is improving. Continue bipap.      Intervention Category Major Interventions: Change in mental status - evaluation and management  Neala Miggins 11/06/2016, 3:18 PM

## 2016-11-07 DIAGNOSIS — I5030 Unspecified diastolic (congestive) heart failure: Secondary | ICD-10-CM

## 2016-11-07 DIAGNOSIS — J9621 Acute and chronic respiratory failure with hypoxia: Secondary | ICD-10-CM

## 2016-11-07 DIAGNOSIS — I2781 Cor pulmonale (chronic): Secondary | ICD-10-CM

## 2016-11-07 DIAGNOSIS — J9622 Acute and chronic respiratory failure with hypercapnia: Secondary | ICD-10-CM

## 2016-11-07 LAB — CBC WITH DIFFERENTIAL/PLATELET
BASOS PCT: 1 %
Basophils Absolute: 0.1 10*3/uL (ref 0.0–0.1)
EOS ABS: 0.2 10*3/uL (ref 0.0–0.7)
Eosinophils Relative: 4 %
HCT: 50.3 % (ref 39.0–52.0)
HEMOGLOBIN: 14.2 g/dL (ref 13.0–17.0)
LYMPHS PCT: 11 %
Lymphs Abs: 0.5 10*3/uL — ABNORMAL LOW (ref 0.7–4.0)
MCH: 24.6 pg — AB (ref 26.0–34.0)
MCHC: 28.2 g/dL — AB (ref 30.0–36.0)
MCV: 87 fL (ref 78.0–100.0)
Monocytes Absolute: 0.9 10*3/uL (ref 0.1–1.0)
Monocytes Relative: 18 %
NEUTROS ABS: 3.2 10*3/uL (ref 1.7–7.7)
Neutrophils Relative %: 66 %
Platelets: 146 10*3/uL — ABNORMAL LOW (ref 150–400)
RBC: 5.78 MIL/uL (ref 4.22–5.81)
RDW: 19 % — ABNORMAL HIGH (ref 11.5–15.5)
WBC: 4.9 10*3/uL (ref 4.0–10.5)

## 2016-11-07 LAB — BASIC METABOLIC PANEL
Anion gap: 7 (ref 5–15)
BUN: 41 mg/dL — AB (ref 6–20)
CHLORIDE: 104 mmol/L (ref 101–111)
CO2: 32 mmol/L (ref 22–32)
CREATININE: 1.5 mg/dL — AB (ref 0.61–1.24)
Calcium: 7.8 mg/dL — ABNORMAL LOW (ref 8.9–10.3)
GFR calc Af Amer: >60 mL/min (ref >60)
GFR calc non Af Amer: 53 mL/min — ABNORMAL LOW (ref >60)
Glucose, Bld: 78 mg/dL (ref 65–99)
Potassium: 4.6 mmol/L (ref 3.5–5.1)
SODIUM: 143 mmol/L (ref 135–145)

## 2016-11-07 LAB — BLOOD GAS, ARTERIAL
Acid-Base Excess: 4.8 mmol/L — ABNORMAL HIGH (ref 0.0–2.0)
BICARBONATE: 33.3 mmol/L — AB (ref 20.0–28.0)
DRAWN BY: 308601
Delivery systems: POSITIVE
Expiratory PAP: 5
FIO2: 30
Inspiratory PAP: 30
Mode: POSITIVE
O2 Saturation: 94.3 %
PATIENT TEMPERATURE: 98.6
PO2 ART: 76.3 mmHg — AB (ref 83.0–108.0)
pCO2 arterial: 68.8 mmHg (ref 32.0–48.0)
pH, Arterial: 7.305 — ABNORMAL LOW (ref 7.350–7.450)

## 2016-11-07 LAB — MAGNESIUM: MAGNESIUM: 1.6 mg/dL — AB (ref 1.7–2.4)

## 2016-11-07 MED ORDER — HYDROMORPHONE HCL 1 MG/ML IJ SOLN
1.0000 mg | INTRAMUSCULAR | Status: DC | PRN
  Administered 2016-11-07 – 2016-11-08 (×3): 1 mg via INTRAVENOUS
  Filled 2016-11-07 (×3): qty 1

## 2016-11-07 MED ORDER — PREMIER PROTEIN SHAKE
11.0000 [oz_av] | Freq: Three times a day (TID) | ORAL | Status: DC
  Filled 2016-11-07 (×3): qty 325.31

## 2016-11-07 MED ORDER — TRAMADOL HCL 50 MG PO TABS
50.0000 mg | ORAL_TABLET | Freq: Four times a day (QID) | ORAL | Status: DC | PRN
  Administered 2016-11-07: 50 mg via ORAL
  Filled 2016-11-07: qty 1

## 2016-11-07 MED ORDER — HYDRALAZINE HCL 20 MG/ML IJ SOLN
10.0000 mg | INTRAMUSCULAR | Status: DC | PRN
  Administered 2016-11-07 – 2016-11-08 (×3): 10 mg via INTRAVENOUS
  Filled 2016-11-07 (×4): qty 1

## 2016-11-07 MED ORDER — MAGNESIUM SULFATE 2 GM/50ML IV SOLN
2.0000 g | Freq: Once | INTRAVENOUS | Status: AC
  Administered 2016-11-07: 2 g via INTRAVENOUS
  Filled 2016-11-07: qty 50

## 2016-11-07 MED ORDER — IBUPROFEN 800 MG PO TABS: 800.0000 mg | ORAL_TABLET | Freq: Once | ORAL | Status: DC

## 2016-11-07 MED ORDER — OXYCODONE HCL 5 MG PO TABS
5.0000 mg | ORAL_TABLET | ORAL | Status: DC | PRN
  Administered 2016-11-07: 5 mg via ORAL
  Filled 2016-11-07: qty 1

## 2016-11-07 NOTE — Progress Notes (Signed)
PROGRESS NOTE    Hunter [name]  ZOX:[id number] DOB: Jan 23, 1971 DOA: 11/05/2016 PCP: Bing Neighbors, FNP     Brief Narrative:  Hunter [name] is a 46 y.o. male with medical history significant of sarcoidosis, chronic hypoxic respiratory failure, hypertension, FSGS, CKD stage III. He is a very poor historian. He states that he has been following with nephrology and was on Lasix twice a day. This was recently changed in the past few days by his new primary care physician. He states that since his medications have been changed, he noted decrease in urine output, worsening shortness of breath as well as increased swelling of his abdomen and lower extremities. He is very upset his primary care physician on why his medications have been changed. On chart review of his office records on 9/25, it appears that patient had already been complaining of abdominal bloating for a week at that time despite taking furosemide. His primary care physician had changed his medication from furosemide to torsemide. He is unsure what his kidney diseases from, unsure if he has ever been diagnosed with heart failure. He is supposed to be on home oxygen at baseline, but due to being at a homeless shelter, has not been using it in the past month. Labs were obtained which revealed BNP 1019. Chest x-ray revealed findings consistent with congestive heart failure. On morning of 9/29, he was found to be somnolent and was placed on BiPAP.    Assessment & Plan:   Active Problems:   CHF (congestive heart failure) (HCC)   Acute hypercapnic respiratory failure (HCC)   Pulmonary edema   Restrictive lung disease secondary to obesity   Obesity hypoventilation syndrome (HCC)   OSA (obstructive sleep apnea)   Right heart failure   -BNP elevated 1019, with chest x-ray findings of cardiomegaly, right pleural effusion with abdominal distention and lower extremity edema  -Echocardiogram EF 55-60%, elevated RVSP  -Lasix IV gtt    -Cardiology following, will need cardiac MRI at some point in future  -Daily weight, I/Os   Acute on chronic hypoxemic and hypercapnic respiratory failure -Has been inconsistently using his home oxygen due to residing at a homeless shelter currently -Now off BiPAP, and ABG with improvement. Appreciate PCCM. 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  -Baseline Cr 0.92 in 09/13/2016   -Nephrology following   Hyperkalemia -Resolved   Hypomagnesemia -Replace  HLD -Continue lipitor   History of sarcoidosis -Has followed with pulmonology in the past, now off prednisone  Morbid obesity -Body mass index is 41.09 kg/m.   Homelessness -Consult social work  Possible sleep apnea -Has outpatient sleep study scheduled   DVT prophylaxis: subq hep Code Status: full Family Communication: no family at bedside Disposition Plan: pending improvement   Consultants:   Cardiology  Nephrology  PCCM  Procedures:   None  Antimicrobials:  Anti-infectives    None       Subjective: Much more alert this morning. Very upset about his left ankle pain (sprained it at home prior to admission). Also nursing report of agitation overnight. No other complaints, denies chest pain or shortness of breath. Feels swelling of his abdomen is better.    Objective: Vitals:   11/07/16 0400 11/07/16 0407 11/07/16 0500 11/07/16 0809  BP: (!) 147/92 (!) 147/92 (!) 170/97   Pulse: 73 76 77   Resp: 14 (!)  21 16   Temp:    98.2 F (36.8 C)  TempSrc:    Oral  SpO2: 95% 96% 97%   Weight:      Height:        Intake/Output Summary (Last 24 hours) at 11/07/16 0939 Last data filed at 11/07/16 0800  Gross per 24 hour  Intake           777.85 ml  Output             3425 ml  Net         -2647.15 ml    Filed Weights   11/06/16 0119 11/06/16 0121 11/06/16 0352  Weight: (!) 137.5 kg (303 lb 2.1 oz) (!) 137.5 kg (303 lb 2.1 oz) (!) 137.1 kg (302 lb 4 oz)    Examination:  General exam: Appears calm and comfortable Respiratory system: Clear to auscultation. Respiratory effort normal. On [place] O2 Cardiovascular system: S1 & S2 heard, RRR. No JVD, murmurs, rubs, gallops or clicks. +edema with abdominal distention, improved  Gastrointestinal system: Abdomen is +distended, soft and nontender. No organomegaly or masses felt. Normal bowel sounds heard. Central nervous system: Alert, nonfocal  Extremities: Symmetric in appearance  Skin: No rashes, lesions or ulcers  Data Reviewed: I have personally reviewed following labs and imaging studies  CBC:  Recent Labs Lab 11/02/16 0841 11/06/16 0324 11/07/16 0344  WBC 5.2 6.1 4.9  NEUTROABS  --   --  3.2  HGB 15.5 14.4 14.2  HCT 52.2* 51.1 50.3  MCV 86.0 89.3 87.0  PLT 152 133* 146*   Basic Metabolic Panel:  Recent Labs Lab 11/02/16 0841 11/05/16 1133 11/05/16 1414 11/05/16 1420 11/06/16 0324 11/07/16 0344  NA 139 142 143  --  143 143  K 5.0 5.0 5.7*  --  5.2* 4.6  CL 100* 106 103  --  104 104  CO2 31 28 33*  --  33* 32  GLUCOSE 94 124* 95  --  102* 78  BUN 58* 46* 47*  --  45* 41*  CREATININE 1.83* 1.48* 1.52*  --  1.62* 1.50*  CALCIUM 7.9* 8.0* 8.0*  --  7.7* 7.8*  MG  --   --   --  2.1  --  1.6*   GFR: Estimated Creatinine Clearance: 86.4 mL/min (A) (by C-G formula based on SCr of 1.5 mg/dL (H)). Liver Function Tests:  Recent Labs Lab 11/02/16 0841 11/05/16 1133  AST 36 37  ALT 38 35  ALKPHOS 124 125  BILITOT 1.1 0.9  PROT 6.1* 6.5  ALBUMIN 2.7* 3.0*   No results for input(s): LIPASE, AMYLASE in the last 168 hours. No results for input(s): AMMONIA in the last 168 hours. Coagulation Profile: No results for input(s): INR, PROTIME in the last 168 hours. Cardiac Enzymes: No results for input(s): CKTOTAL, CKMB,  CKMBINDEX, TROPONINI in the last 168 hours. BNP (last 3 results) No results for input(s): PROBNP in the last 8760 hours. HbA1C: No results for input(s): HGBA1C in the last 72 hours. CBG: No results for input(s): GLUCAP in the last 168 hours. Lipid Profile: No results for input(s): CHOL, HDL, LDLCALC, TRIG, CHOLHDL, LDLDIRECT in the last 72 hours. Thyroid Function Tests:  Recent Labs  11/06/16 0803  TSH 1.427   Anemia Panel: No results for input(s): VITAMINB12, FOLATE, FERRITIN, TIBC, IRON, RETICCTPCT in the last 72 hours. Sepsis Labs: No results for input(s): PROCALCITON, LATICACIDVEN in the last 168 hours.  Recent Results (from the past 240 hour(s))  MRSA  PCR Screening     Status: None   Collection Time: 11/06/16  1:40 AM  Result Value Ref Range Status   MRSA by PCR NEGATIVE NEGATIVE Final    Comment:        The GeneXpert MRSA Assay (FDA approved for NASAL specimens only), is one component of a comprehensive MRSA colonization surveillance program. It is not intended to diagnose MRSA infection nor to guide or monitor treatment for MRSA infections.        Radiology Studies: Dg Chest 2 View  Result Date: 11/05/2016 CLINICAL DATA:  Shortness of Breath EXAM: CHEST  2 VIEW COMPARISON:  February 18, 2016 FINDINGS: There is cardiomegaly with pulmonary venous hypertension. There is a stable focal right pleural effusion. There is interstitial edema. There is patchy airspace opacity in the left lower lobe. No adenopathy. No evident bone lesions. IMPRESSION: Findings consistent with congestive heart failure. Question alveolar edema versus patchy pneumonia left lower lobe. Both entities may exist concurrently. Electronically Signed   By: Bretta Bang III M.D.   On: 11/05/2016 14:26      Scheduled Meds: . chlorhexidine  15 mL Mouth Rinse BID  . heparin  5,000 Units Subcutaneous Q8H  . mouth rinse  15 mL Mouth Rinse q12n4p   Continuous Infusions: . furosemide (LASIX)  infusion 10 mg/hr (11/07/16 0700)  . magnesium sulfate 1 - 4 g bolus IVPB       LOS: 2 days    Time spent: 30 minutes   Noralee Stain, DO [facility] [email or web address] Password [facility] 11/07/2016, 9:39 AM

## 2016-11-07 NOTE — Progress Notes (Signed)
Patient ID: Hunter [name], male   DOB: Jul 14, 1970, 46 y.o.   MRN: [id number] S:Doesn't know how he ended up in the hospital O:BP (!) 170/97   Pulse 77   Temp 98.1 F (36.7 C) (Oral)   Resp 16   Ht 6' (1.829 m)   Wt (!) 137.1 kg (302 lb 4 oz)   SpO2 97%   BMI 40.99 kg/m   Intake/Output Summary (Last 24 hours) at 11/07/16 1216 Last data filed at 11/07/16 1200  Gross per 24 hour  Intake          1347.85 ml  Output             3300 ml  Net         -1952.15 ml   Intake/Output: I/O last 3 completed shifts: In: 979.7 [P.O.:480; I.V.:499.7] Out: 3150 [Urine:3150]  Intake/Output this shift:  Total I/O In: 580.9 [P.O.:480; I.V.:50.9; IV Piggyback:50] Out: 1000 [Urine:1000] Weight change:  NWG:NFAOZ AAM in mild respiratory distress CVS:no rub Resp:decreased BS at bases HYQ:MVHQI, +BS, soft, NT Ext:1+ edema   Recent Labs Lab 11/02/16 0841 11/05/16 1133 11/05/16 1414 11/06/16 0324 11/07/16 0344  NA 139 142 143 143 143  K 5.0 5.0 5.7* 5.2* 4.6  CL 100* 106 103 104 104  CO2 31 28 33* 33* 32  GLUCOSE 94 124* 95 102* 78  BUN 58* 46* 47* 45* 41*  CREATININE 1.83* 1.48* 1.52* 1.62* 1.50*  ALBUMIN 2.7* 3.0*  --   --   --   CALCIUM 7.9* 8.0* 8.0* 7.7* 7.8*  AST 36 37  --   --   --   ALT 38 35  --   --   --    Liver Function Tests:  Recent Labs Lab 11/02/16 0841 11/05/16 1133  AST 36 37  ALT 38 35  ALKPHOS 124 125  BILITOT 1.1 0.9  PROT 6.1* 6.5  ALBUMIN 2.7* 3.0*   No results for input(s): LIPASE, AMYLASE in the last 168 hours. No results for input(s): AMMONIA in the last 168 hours. CBC:  Recent Labs Lab 11/02/16 0841 11/06/16 0324 11/07/16 0344  WBC 5.2 6.1 4.9  NEUTROABS  --   --  3.2  HGB 15.5 14.4 14.2  HCT 52.2* 51.1 50.3  MCV 86.0 89.3 87.0  PLT 152 133* 146*   Cardiac Enzymes: No results for input(s): CKTOTAL, CKMB, CKMBINDEX, TROPONINI in the last 168 hours. CBG: No results for input(s): GLUCAP in the last 168 hours.  Iron Studies: No results  for input(s): IRON, TIBC, TRANSFERRIN, FERRITIN in the last 72 hours. Studies/Results: Dg Chest 2 View  Result Date: 11/05/2016 CLINICAL DATA:  Shortness of Breath EXAM: CHEST  2 VIEW COMPARISON:  February 18, 2016 FINDINGS: There is cardiomegaly with pulmonary venous hypertension. There is a stable focal right pleural effusion. There is interstitial edema. There is patchy airspace opacity in the left lower lobe. No adenopathy. No evident bone lesions. IMPRESSION: Findings consistent with congestive heart failure. Question alveolar edema versus patchy pneumonia left lower lobe. Both entities may exist concurrently. Electronically Signed   By: Bretta Bang III M.D.   On: 11/05/2016 14:26   . chlorhexidine  15 mL Mouth Rinse BID  . heparin  5,000 Units Subcutaneous Q8H  . mouth rinse  15 mL Mouth Rinse q12n4p    BMET    Component Value Date/Time   NA 143 11/07/2016 0344   K 4.6 11/07/2016 0344   CL 104 11/07/2016 0344   CO2 32 11/07/2016  0344   GLUCOSE 78 11/07/2016 0344   BUN 41 (H) 11/07/2016 0344   CREATININE 1.50 (H) 11/07/2016 0344   CALCIUM 7.8 (L) 11/07/2016 0344   GFRNONAA 53 (L) 11/07/2016 0344   GFRAA >60 11/07/2016 0344   CBC    Component Value Date/Time   WBC 4.9 11/07/2016 0344   RBC 5.78 11/07/2016 0344   HGB 14.2 11/07/2016 0344   HCT 50.3 11/07/2016 0344   PLT 146 (L) 11/07/2016 0344   MCV 87.0 11/07/2016 0344   MCH 24.6 (L) 11/07/2016 0344   MCHC 28.2 (L) 11/07/2016 0344   RDW 19.0 (H) 11/07/2016 0344   LYMPHSABS 0.5 (L) 11/07/2016 0344   MONOABS 0.9 11/07/2016 0344   EOSABS 0.2 11/07/2016 0344   BASOSABS 0.1 11/07/2016 0344     Assessment/Plan:  1. AKI/CKD due to decompensated CHF.  Improving with diuresis and continue to hold ACE inhibitor. 2. Decompensated CHF/Nephrotic syndrome- gained 25 lbs since 09/13/16.  Continue with IV lasix for another 24 hours and consider trying him on po lasix tomorrow. 3. 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 Hyperkalemia - improved. 5. Disposition- poor living situation as he is in a homeless shelter.  Will need to involve SW/CM to assist in placement.  Irena Cords, MD [employer] [phone]

## 2016-11-07 NOTE — Progress Notes (Signed)
Initial Nutrition Assessment  DOCUMENTATION CODES:   Morbid obesity  INTERVENTION:   Recommend check Phosphorus   Premier Protein TID, each supplement provides 160 kcal and 30 grams of protein.   Daily weights   NUTRITION DIAGNOSIS:   Inadequate oral intake related to acute illness as evidenced by meal completion < 25%.  GOAL:   Patient will meet greater than or equal to 90% of their needs  MONITOR:   PO intake, Supplement acceptance, Weight trends, Labs  REASON FOR ASSESSMENT:   Ventilator    ASSESSMENT:   46 y.o. male with medical history significant of sarcoidosis, chronic hypoxic respiratory failure, hypertension, FSGS, CKD stage III. He is a very poor historian. He is supposed to be on home oxygen at baseline, but due to being at a homeless shelter, has not been using it in the past month. Labs were obtained which revealed BNP 1019. Chest x-ray revealed findings consistent with congestive heart failure  Pt screened for ventilator pt. Pt is not intubated. Pt very confused at time of RD visit today and irritable. Pt reports good appetite pta but reports that he is not eating in the hospital. Pt was holding menu at time of RD visit and reports he knows how to order meals. Per chart, pt with 7lb wt gain since last admit; this is likely related to fluid changes. Pt is morbidly obese so RD will order Premier Protein to help pt meet estimated protein needs. Recommend daily weights. Pt with hypomagnesemia; monitor and supplement as needed per MD discretion. Recommend check Phosphorus.    Medications reviewed and include: heparin, lasix, hydromorphone, oxycodone   Labs reviewed: BUN 41(H), creat 1.50(H), Ca 7.8(L), Mg 1.6(L) BNP- 1019(H)- 9/28  Nutrition-Focused physical exam completed. Findings are no fat depletion, no muscle depletion, and mild edema in BLE.   Diet Order:  Diet renal with fluid restriction Fluid restriction: 1200 mL Fluid; Room service appropriate? Yes;  Fluid consistency: Thin  Skin:  Reviewed, no issues  Last BM:  pta  Height:   Ht Readings from Last 1 Encounters:  11/06/16 6' (1.829 m)    Weight:   Wt Readings from Last 1 Encounters:  11/06/16 (!) 302 lb 4 oz (137.1 kg)    Ideal Body Weight:  80.9 kg  BMI:  Body mass index is 40.99 kg/m.  Estimated Nutritional Needs:   Kcal:  2300-2600kcal/day   Protein:  137-151g/day   Fluid:  >2.3L/day or per MD   EDUCATION NEEDS:   No education needs identified at this time  Betsey Holiday MS, RD, LDN Pager #[phone] After Hours Pager: [phone]

## 2016-11-07 NOTE — Progress Notes (Signed)
Patients O2 saturation dropped into the low 70s range with a good waveform. Patient refused to go on BiPap or be sat up in bed. Education provided on need for bipap when sleeping. Patient belligerent and continuing to refuse, despite conversation with charge RN, RT, and patients RN. Will continue to monitor closely and encourage BiPap as needed. Patient awake with O2 at 93 percent at this time.

## 2016-11-07 NOTE — Progress Notes (Addendum)
Pt found on 6lnc, taken off bipap by Rn per patient's request.  HR85, rr23, spo2 97%.  Pt is awake, alert and oriented, no increased wob/respiratory distress noted or voiced by pt at this time.  Bipap not indicated at this time but remains in room on standby.  Pt weaned down to 3lnc, Rn aware.  Spo2 now 94%.  RT will continue to monitor and assess pt as needed.

## 2016-11-07 NOTE — Progress Notes (Signed)
Progress Note  Patient Name: Hunter [name] Date of Encounter: 11/07/2016   Subjective   SOB is improving.   Inpatient Medications    Scheduled Meds: . chlorhexidine  15 mL Mouth Rinse BID  . heparin  5,000 Units Subcutaneous Q8H  . mouth rinse  15 mL Mouth Rinse q12n4p   Continuous Infusions: . furosemide (LASIX) infusion 10 mg/hr (11/07/16 0700)   PRN Meds: acetaminophen **OR** acetaminophen, ondansetron **OR** ondansetron (ZOFRAN) IV, polyethylene glycol   Vital Signs    Vitals:   11/07/16 0344 11/07/16 0400 11/07/16 0407 11/07/16 0500  BP:  (!) 147/92 (!) 147/92 (!) 170/97  Pulse:  73 76 77  Resp:  14 (!) 21 16  Temp: 97.6 F (36.4 C)     TempSrc: Axillary     SpO2:  95% 96% 97%  Weight:      Height:        Intake/Output Summary (Last 24 hours) at 11/07/16 0750 Last data filed at 11/07/16 0736  Gross per 24 hour  Intake           767.85 ml  Output             3250 ml  Net         -2482.15 ml   Filed Weights   11/06/16 0119 11/06/16 0121 11/06/16 0352  Weight: (!) 303 lb 2.1 oz (137.5 kg) (!) 303 lb 2.1 oz (137.5 kg) (!) 302 lb 4 oz (137.1 kg)    Telemetry    SR- Personally Reviewed  ECG     Physical Exam   GEN: No acute distress.   Neck: elevated JVD Cardiac: RRR, no murmurs, rubs, or gallops.  Respiratory: bilateral crackles GI: Soft, nontender, non-distended  MS: No edema; No deformity. Neuro:  Nonfocal  Psych: Normal affect   Labs    Chemistry Recent Labs Lab 11/02/16 0841 11/05/16 1133 11/05/16 1414 11/06/16 0324 11/07/16 0344  NA 139 142 143 143 143  K 5.0 5.0 5.7* 5.2* 4.6  CL 100* 106 103 104 104  CO2 31 28 33* 33* 32  GLUCOSE 94 124* 95 102* 78  BUN 58* 46* 47* 45* 41*  CREATININE 1.83* 1.48* 1.52* 1.62* 1.50*  CALCIUM 7.9* 8.0* 8.0* 7.7* 7.8*  PROT 6.1* 6.5  --   --   --   ALBUMIN 2.7* 3.0*  --   --   --   AST 36 37  --   --   --   ALT 38 35  --   --   --   ALKPHOS 124 125  --   --   --   BILITOT 1.1 0.9  --    --   --   GFRNONAA 42* 54* 53* 49* 53*  GFRAA 49* >60 >60 56* >60  ANIONGAP Hematology Recent Labs Lab 11/02/16 0841 11/06/16 0324 11/07/16 0344  WBC 5.2 6.1 4.9  RBC 6.07* 5.72 5.78  HGB 15.5 14.4 14.2  HCT 52.2* 51.1 50.3  MCV 86.0 89.3 87.0  MCH 25.5* 25.2* 24.6*  MCHC 29.7* 28.2* 28.2*  RDW 18.3* 18.8* 19.0*  PLT 152 133* 146*    Cardiac EnzymesNo results for input(s): TROPONINI in the last 168 hours.  Recent Labs Lab 11/05/16 1420  TROPIPOC 0.00     BNP Recent Labs Lab 11/02/16 0841 11/05/16 1133 11/05/16 1414  BNP 729.6* 1,034.7* 1,019.5*     DDimer No results for input(s): DDIMER in the last  168 hours.   Radiology    Dg Chest 2 View  Result Date: 11/05/2016 CLINICAL DATA:  Shortness of Breath EXAM: CHEST  2 VIEW COMPARISON:  February 18, 2016 FINDINGS: There is cardiomegaly with pulmonary venous hypertension. There is a stable focal right pleural effusion. There is interstitial edema. There is patchy airspace opacity in the left lower lobe. No adenopathy. No evident bone lesions. IMPRESSION: Findings consistent with congestive heart failure. Question alveolar edema versus patchy pneumonia left lower lobe. Both entities may exist concurrently. Electronically Signed   By: Bretta Bang III M.D.   On: 11/05/2016 14:26    Cardiac Studies    Patient Profile     Mr. Bovey is a 46 y.o.male history of sarcoidosis, HTN, CKD III, chronic hypoxic resp failure supposed to be on home O2, homelessnes,admitted with increased SOB, abdominal distension, and LE edema. PCP recenlty changed his furosemide to torsemide as outpatient without improvement in symptoms. He denies any significant chest pain. He reports compliance with medications. Patient has seen Dr Donnie Aho in cardiology clinic previously, however has not been compliant with appointments.   Assessment & Plan    1. Acute heart failure with preserved ejection fraction/RV failure - echo LVEF  55-60%, no WMAs, moderate RV dysfunction, dilated IVC. Diastolic function not reported, however fairly normal parameters and normal LA.  - probable cor pulmonale given his chronic lung disease history and O2 depedence but poor compliance - negaive 2.1 liters yesterday, negative 2.9 liters since admision. He is on lasix drip at 10 started by pulm critical care. Fairly stable renal function - continue diuretics. Primary management of cor pulmonale is management of pulmonary issues and diuretics for symptom control.  - cardiac sarcoid is less likely. His LV is essentially normal, no significant arrhythmias. There are however cases of cardiac sarcoid that primarily involve the RV, and at some point in the future he should have a cardiac MRI pending his renal function.    2. Nephrotic syndrome secondary to FSG - per nephrology  3. Chronic respiratory failure - followed by pulm  4. History of sarcoidosis   For questions or updates, please contact [facility] Please consult [email or web address] for contact info under Cardiology/STEMI.      Joanie Coddington, MD  11/07/2016, 7:50 AM

## 2016-11-07 NOTE — Progress Notes (Signed)
Pt off BIPAP at this time. No distress noted. 

## 2016-11-07 NOTE — Progress Notes (Signed)
Patient has been mostly sleeping this night shift.  Arousable, but goes right back to sleep.  Woke briefly at the beginning of the shift to answer questions and fell back asleep before I left the room.  Administered 2200 Heparin injection and patient did not wake up or indicate any pain.  Around 2330, patient awoke, began screaming loudly.  Upon entering the room, the patient began screaming about being in pain and "not being taken care of".  I calmly explained to the patient that I would be glad to get him some pain medication, but asked if he would mind lowering his voice due to other patients sleeping on the unit.  He continued to scream and became belligerent.  Retrieved pain medication and patient continued to berate me as I logged into the computer, and scanned his armband and the medication.  Explained to patient that it generally takes a few minutes to prepare the medication for administration.  He continued to yell, stating "I've been in pain any moment I'm awake", "Do you nurses not pass along information?"  "I don't know how you can be so uncaring about this".  Explained to patient that I did care about his pain, however I did not appreciate being yelled at.  Explained to him that the outgoing RN reported his pain and when he received pain medication on her shift.  I explained that of the several times I had been in his room and woken him up, he never complained of pain at those times.  I assured him I would treat his pain as he requested.  He demanded that I "turn on the TV and just leave".  I again requested not to be yelled at, turned on the TV, and exited the room.  Will continue to monitor.

## 2016-11-07 NOTE — Progress Notes (Signed)
Name: Hunter [name] MRN: [id number] DOB: [birth year]    ADMISSION DATE:  11/05/2016   CONSULTATION DATE:  11/06/2016  REFERRING MD :  Dr Alvino Chapel  CHIEF COMPLAINT:  Acute hypercapnic respiratory failure  BRIEF PATIENT DESCRIPTION: 46 y.o. 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 Questionable obesity hypoventilation syndrome based on. Also may have obstructive sleep apnea. Started on noninvasive positive pressure ventilation and Lasix drip. Denies any history of polysomnogram or witnessed apneas. No steroid therapy for sarcoidosis for years. Reportedly no other organ involvement of sarcoidosis.  SUBJECTIVE:  No acute events overnight. Denies any chest pain or pressure. Dyspnea improving. No significant cough.  REVIEW OF SYSTEMS:  No subjective fever or chills. No abdominal pain or nausea. Complaining of significant foot pain despite pain medication.  VITAL SIGNS: Temp:  [97.5 F (36.4 C)-98.2 F (36.8 C)] 98.1 F (36.7 C) (09/30 1159) Pulse Rate:  [68-99] 77 (09/30 0500) Resp:  [14-26] 16 (09/30 0500) BP: (118-170)/(63-100) 170/97 (09/30 0500) SpO2:  [84 %-100 %] 97 % (09/30 0500) FiO2 (%):  [30 %] 30 % (09/30 0407)  PHYSICAL EXAMINATION: General:  Awake. Alert. No acute distress. Laying on his right side holding a menu and watching TV.  Integument:  Warm & dry. No rash on exposed skin. No bruising on exposed skin. Extremities:  No cyanosis or clubbing.  HEENT:  No scleral icterus. Moist mucous membranes. Cardiovascular:  Regular rhythm. No edema. Unable to appreciate JVD with body positioning.  Pulmonary:  Slightly diminished breath sounds in the bases. Normal work of breathing on supplemental oxygen. Abdomen: Soft. Normal bowel sounds. Protuberant. Musculoskeletal:  Normal bulk and tone. No joint deformity or effusion appreciated.   Recent Labs Lab 11/05/16 1414 11/06/16 0324  11/07/16 0344  NA 143 143 143  K 5.7* 5.2* 4.6  CL 103 104 104  CO2 33* 33* 32  BUN 47* 45* 41*  CREATININE 1.52* 1.62* 1.50*  GLUCOSE 95 102* 78    Recent Labs Lab 11/02/16 0841 11/06/16 0324 11/07/16 0344  HGB 15.5 14.4 14.2  HCT 52.2* 51.1 50.3  WBC 5.2 6.1 4.9  PLT 152 133* 146*   Dg Chest 2 View  Result Date: 11/05/2016 CLINICAL DATA:  Shortness of Breath EXAM: CHEST  2 VIEW COMPARISON:  February 18, 2016 FINDINGS: There is cardiomegaly with pulmonary venous hypertension. There is a stable focal right pleural effusion. There is interstitial edema. There is patchy airspace opacity in the left lower lobe. No adenopathy. No evident bone lesions. IMPRESSION: Findings consistent with congestive heart failure. Question alveolar edema versus patchy pneumonia left lower lobe. Both entities may exist concurrently. Electronically Signed   By: Bretta Bang III M.D.   On: 11/05/2016 14:26   IMAGING/STUDIES: PFT 02/12/16: FVC 1.72 L (38%) FEV1 1.23 L (34%) FEV1/FVC 0 point someone FEF 25-75 0.74 L (20%) negative bronchodilator response TLC 3.99 L (55%) RV 105% DLCO corrected 62% TTE 9/29:  LV normal in size with EF 55-60% & normal regional wall motion. LA normal in size & RA severely dilated. RV dilated with moderately reduced systolic function. Pulmonary artery systolic pressure 32 mmHg. No aortic stenosis or regurgitation. Mild aortic root dilatation. No mitral stenosis or regurgitation. Mild pulmonic regurgitation with poorly visualized valve. Mild tricuspid regurgitation. No pericardial effusion. CXR PA/LAT 9/28:  Personally reviewed by me. Low lung volumes. Patchy bilateral lower lung predominant alveolar opacification as well as blunting of costophrenic angles  consistent with pleural effusions. Fluid within right fissure as well. Borderline cardiomegaly with normal mediastinal and are contours.  MICROBIOLOGY: MRSA PCR 9/29:  Negative  ANTIBIOTICS:  LINES/TUBES: Foley 9/29  >>> PIV  SIGNIFICANT EVENTS: 09/28 - Admit  ASSESSMENT / PLAN:  46 y.o. male with obesity and moderate restrictive lung disease based on PFTs from earlier this year. Patient likely has OSA/OHS. Respiratory status steadily improving with diuresis. Normal work of breathing off BiPAP support. Suspect decompensation is multifactorial in etiology. Doubtful this represents an acute flare of sarcoidosis.  1. Acute on chronic hypercarbic respiratory failure: Suspect underlying OSA/OHS. Adjusting target saturation to 88-94% to minimize VQ mismatching. BiPAP ordered as needed for increased work of breathing. 2. Acute on chronic hypoxic respiratory failure: Continuing diuresis with Lasix drip as per cardiology. Recommend targeting saturation 88-94% to minimize VQ mismatching. 3. Moderate restrictive lung disease: Likely secondary to obesity. Plan for high-resolution CT imaging after diuresis. 4. Pulmonary hypertension: Suggested on echocardiogram. Likely multifactorial from inconsistent use of oxygen therapy. 5. Sarcoidosis: No indication of acute flare. Can consider cardiac PET CT in place of cardiac MRI to rule out cardiac sarcoidosis.  I have spent a total of 37 minutes of time today caring for the patient, reviewing the patient's electronic medical record, and with more than 50% of that time spent coordinating care with the patient as well as reviewing the continuing plan of care with the patient at bedside.  Remainder of care as per primary service & other consultants.   Donna Christen Jamison Neighbor, M.D. [facility] Pager:  [phone] After 3pm or if no response, call [phone] 11/07/2016, 12:15 PM

## 2016-11-08 ENCOUNTER — Inpatient Hospital Stay (HOSPITAL_COMMUNITY): Payer: Medicaid Other

## 2016-11-08 ENCOUNTER — Inpatient Hospital Stay (HOSPITAL_COMMUNITY)
Admission: RE | Admit: 2016-11-08 | Discharge: 2016-11-08 | Disposition: A | Discharge status: Home / Self Care | Payer: Medicaid Other | Source: Ambulatory Visit | Attending: Family Medicine | Admitting: Family Medicine

## 2016-11-08 DIAGNOSIS — I50811 Acute right heart failure: Secondary | ICD-10-CM

## 2016-11-08 DIAGNOSIS — R0602 Shortness of breath: Secondary | ICD-10-CM

## 2016-11-08 LAB — CBC WITH DIFFERENTIAL/PLATELET
BASOS ABS: 0 10*3/uL (ref 0.0–0.1)
BASOS PCT: 1 %
EOS ABS: 0.1 10*3/uL (ref 0.0–0.7)
EOS PCT: 1 %
HCT: 51.6 % (ref 39.0–52.0)
HEMOGLOBIN: 14.8 g/dL (ref 13.0–17.0)
LYMPHS PCT: 14 %
Lymphs Abs: 0.9 10*3/uL (ref 0.7–4.0)
MCH: 24.6 pg — ABNORMAL LOW (ref 26.0–34.0)
MCHC: 28.7 g/dL — ABNORMAL LOW (ref 30.0–36.0)
MCV: 85.9 fL (ref 78.0–100.0)
Monocytes Absolute: 1.2 10*3/uL — ABNORMAL HIGH (ref 0.1–1.0)
Monocytes Relative: 19 %
NEUTROS ABS: 4.2 10*3/uL (ref 1.7–7.7)
NEUTROS PCT: 66 %
PLATELETS: 132 10*3/uL — AB (ref 150–400)
RBC: 6.01 MIL/uL — AB (ref 4.22–5.81)
RDW: 18.6 % — ABNORMAL HIGH (ref 11.5–15.5)
WBC: 6.4 10*3/uL (ref 4.0–10.5)

## 2016-11-08 LAB — BLOOD GAS, ARTERIAL
Acid-Base Excess: 5.5 mmol/L — ABNORMAL HIGH (ref 0.0–2.0)
Bicarbonate: 36.1 mmol/L — ABNORMAL HIGH (ref 20.0–28.0)
DRAWN BY: 257881
O2 Content: 3 L/min
O2 Saturation: 90.4 %
PCO2 ART: 82.8 mmHg — AB (ref 32.0–48.0)
PH ART: 7.263 — AB (ref 7.350–7.450)
Patient temperature: 98.6
pO2, Arterial: 64.8 mmHg — ABNORMAL LOW (ref 83.0–108.0)

## 2016-11-08 LAB — BASIC METABOLIC PANEL
Anion gap: 9 (ref 5–15)
BUN: 42 mg/dL — ABNORMAL HIGH (ref 6–20)
CHLORIDE: 100 mmol/L — AB (ref 101–111)
CO2: 33 mmol/L — ABNORMAL HIGH (ref 22–32)
Calcium: 8.1 mg/dL — ABNORMAL LOW (ref 8.9–10.3)
Creatinine, Ser: 1.44 mg/dL — ABNORMAL HIGH (ref 0.61–1.24)
GFR, EST NON AFRICAN AMERICAN: 56 mL/min — AB (ref >60)
Glucose, Bld: 71 mg/dL (ref 65–99)
POTASSIUM: 4.5 mmol/L (ref 3.5–5.1)
SODIUM: 142 mmol/L (ref 135–145)

## 2016-11-08 LAB — MAGNESIUM: MAGNESIUM: 1.6 mg/dL — AB (ref 1.7–2.4)

## 2016-11-08 LAB — URIC ACID: URIC ACID, SERUM: 11.9 mg/dL — AB (ref 4.4–7.6)

## 2016-11-08 MED ORDER — MAGNESIUM SULFATE 2 GM/50ML IV SOLN
2.0000 g | Freq: Once | INTRAVENOUS | Status: AC
  Administered 2016-11-08: 2 g via INTRAVENOUS
  Filled 2016-11-08: qty 50

## 2016-11-08 MED ORDER — PREDNISONE 10 MG PO TABS
50.0000 mg | ORAL_TABLET | Freq: Every day | ORAL | Status: AC
  Administered 2016-11-08 – 2016-11-10 (×3): 50 mg via ORAL
  Filled 2016-11-08: qty 2
  Filled 2016-11-08: qty 1
  Filled 2016-11-08: qty 2

## 2016-11-08 NOTE — Progress Notes (Addendum)
Subjective:  I saw the patient in September 2016 once in the office.  At that time he had an echocardiogram consistent with pulmonary hypertension with preserved LV function.  He had a prior history of sarcoidosis and I was concerned that this represented cor pulmonale.  I was able to get a CT scan on him and found to be nephrotic and referred him to the nephrologist.  He has failed to return for any follow-up since then in my office.  He has continued to smoke and has a number of social situations now in that he has been living in a homeless shelter.  He has significant hypoxemia and also in the past has had polycythemia.  He has never returned for any additional cardiovascular workup.  He was admitted with volume overload.  He was recently changed to somewhat low dose torsemide as an outpatient.  In the hospital he haseen noncompliant with wearing  BiPAP and is reluctant to give a history.  He would not tell me how he wound up in the hospital and was fairly uncommunicative except that he told me that he needed ice.    Objective:  Vital Signs in the last 24 hours: BP 137/80 (BP Location: Right Arm)   Pulse 93   Temp 98.3 F (36.8 C) (Oral)   Resp 14   Ht 6' (1.829 m)   Wt (!) 137.1 kg (302 lb 4 oz)   SpO2 96%   BMI 40.99 kg/m   Physical Exam: Somnolent appearing black male poor historian Lungs:  Clear  Cardiac:  Regular rhythm, normal S1 and S2, no S3 Abdomen:  Soft, nontender, no masses Extremities:  No edema present  Intake/Output from previous day: 09/30 0701 - 10/01 0700 In: 750.9 [P.O.:480; I.V.:220.9; IV Piggyback:50] Out: 3575 [Urine:3575]   Weight Filed Weights   11/06/16 0119 11/06/16 0121 11/06/16 0352  Weight: (!) 137.5 kg (303 lb 2.1 oz) (!) 137.5 kg (303 lb 2.1 oz) (!) 137.1 kg (302 lb 4 oz)    Lab Results: Basic Metabolic Panel:  Recent Labs  16/10/96 0344 11/08/16 0327  NA 143 142  K 4.6 4.5  CL 104 100*  CO2 32 33*  GLUCOSE 78 71  BUN 41* 42*   CREATININE 1.50* 1.44*    CBC:  Recent Labs  11/07/16 0344 11/08/16 0327  WBC 4.9 6.4  NEUTROABS 3.2 4.2  HGB 14.2 14.8  HCT 50.3 51.6  MCV 87.0 85.9  PLT 146* 132*    BNP    Component Value Date/Time   BNP 1,019.5 (H) 11/05/2016 1414   BNP CANCELED 10/26/2016 1139   Telemetry: Sinus rhythm  Assessment/Plan:   1.  Cor pulmonale with evidence of right heart pressure elevation with septal flattening on the echo.  He has never really had much in the way of a cardiovascular workup because he has not returned to get this worked up.  I suspect that this is his major problem and its unclear whether the etiology of this is primary pulmonary hypertension, due to sarcoidosis, or due to obstructive sleep apnea and obesity hypoventilation syndrome.  He really needs close pulmonary follow-up and needs to have treatment of his weight and hypoxemia as hypoxemia will continue to worsen this.  Ideally he needs to have a right heart cath. 2.  Nephrotic syndrome under treatment by the nephrologists 3.  Morbid obesity 4.  Ongoing tobacco abuse  Recommendations:  I will speak to the advanced heart failure physicians about a right heart cath to  assess his pulmonary pressures as he did not have a large enough tricuspid jet to assess his right heart pressures by echo although the echo findings suggest severe pulmonary hypertension.  It may be that the clinic can provide resources as much of his problem is related to noncompliance and lack of ongoing follow-up.  A PET/CT could evaluate with a he has cardiac sarcoid although this appears to be mostly a right heart problem and I suspect that this is multifactorial due to sleep apnea, obesity hypoventilation syndrome and possibly the contribution of sarcoidosis.   Darden Palmer  MD [facility]  11/08/2016, 8:40 AM

## 2016-11-08 NOTE — Progress Notes (Signed)
Entered room and pt. Off bipap and on [place].  Asked pt. How long he had been off bipap and pt. Would not answer.  Aide in room and ask her if pt. Was non-verbal.  Pt. Rose up out of bed and aggressively asked " what she talking about".  Pt. Would not converse with the RT so I left the room.

## 2016-11-08 NOTE — Progress Notes (Signed)
Name: Hunter [name] MRN: [id number] DOB: September 02, 1970    ADMISSION DATE:  11/05/2016   CONSULTATION DATE:  11/06/2016  REFERRING MD :  Dr Alvino Chapel  CHIEF COMPLAINT:  Acute hypercapnic respiratory failure  BRIEF PATIENT DESCRIPTION: 46 y.o. 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 Questionable obesity hypoventilation syndrome based on. Also may have obstructive sleep apnea. Started on noninvasive positive pressure ventilation and Lasix drip. Denies any history of polysomnogram or witnessed apneas. No steroid therapy for sarcoidosis for years. Reportedly no other organ involvement of sarcoidosis.  SUBJECTIVE:   Refusing BiPAP therapy at night. Belligerent and nursing staff, asking for pain medication. Complaining of left ankle pain  VITAL SIGNS: Temp:  [97.6 F (36.4 C)-98.4 F (36.9 C)] 98.3 F (36.8 C) (10/01 0700) Pulse Rate:  [86-95] 93 (10/01 0800) Resp:  [6-26] 14 (10/01 0800) BP: (111-164)/(55-96) 137/80 (10/01 0800) SpO2:  [90 %-99 %] 96 % (10/01 0800) FiO2 (%):  [40 %] 40 % (10/01 0042) 4 L nasal cannula PHYSICAL EXAMINATION: General: This is a 46 year old [name] patient lying in bed currently in no acute distress, however he is slow to respond, falls back to sleep during conversation. HEENT: Normocephalic/atraumatic mucous membranes are moist, he has no jugular venous distention Cardiac: Regular rate and rhythm no audible murmur or gallop Pulmonary diminished throughout, does have mildly labored respiratory pattern when attempting to communicate Abdomen: Large, no organomegaly, positive bowel sounds  extremities: Warm, lower extremities are both edematous, his left ankle is painful to touch Neuro: Awake, oriented 3, withdrawn, and drowsy.   Recent Labs Lab 11/06/16 0324 11/07/16 0344 11/08/16 0327  NA 143 143 142  K 5.2* 4.6 4.5  CL 104 104 100*  CO2 33* 32  33*  BUN 45* 41* 42*  CREATININE 1.62* 1.50* 1.44*  GLUCOSE 102* 78 71    Recent Labs Lab 11/06/16 0324 11/07/16 0344 11/08/16 0327  HGB 14.4 14.2 14.8  HCT 51.1 50.3 51.6  WBC 6.1 4.9 6.4  PLT 133* 146* 132*   ABG    Component Value Date/Time   PHART 7.305 (L) 11/07/2016 0402   PCO2ART 68.8 (HH) 11/07/2016 0402   PO2ART 76.3 (L) 11/07/2016 0402   HCO3 33.3 (H) 11/07/2016 0402   ACIDBASEDEF 1.8 11/06/2016 0825   O2SAT 94.3 11/07/2016 0402   No results found. IMAGING/STUDIES: PFT 02/12/16: FVC 1.72 L (38%) FEV1 1.23 L (34%) FEV1/FVC 0 point someone FEF 25-75 0.74 L (20%) negative bronchodilator response TLC 3.99 L (55%) RV 105% DLCO corrected 62% TTE 9/29:  LV normal in size with EF 55-60% & normal regional wall motion. LA normal in size & RA severely dilated. RV dilated with moderately reduced systolic function. Pulmonary artery systolic pressure 32 mmHg. No aortic stenosis or regurgitation. Mild aortic root dilatation. No mitral stenosis or regurgitation. Mild pulmonic regurgitation with poorly visualized valve. Mild tricuspid regurgitation. No pericardial effusion. CXR PA/LAT 9/28:  Personally reviewed by me. Low lung volumes. Patchy bilateral lower lung predominant alveolar opacification as well as blunting of costophrenic angles consistent with pleural effusions. Fluid within right fissure as well. Borderline cardiomegaly with normal mediastinal and are contours.  MICROBIOLOGY: MRSA PCR 9/29:  Negative  ANTIBIOTICS:  LINES/TUBES: Foley 9/29 >>> PIV  SIGNIFICANT EVENTS: 09/28 - Admit  ASSESSMENT / PLAN:   Acute on chronic hypoxic and hypercarbic respiratory failure.  Restrictive lung disease Presumed obstructive sleep apnea Presumed obesity hypoventilation syndrome History of  sarcoidosis, no evidence of acute flare Secondary pulmonary artery hypertension Decompensated cor pulmonale History of sarcoidosis. History of medical noncompliance AK I Left ankle pain  question gout  Discussion:  46 y.o. male with obesity and moderate restrictive lung disease based on PFTs from earlier this year. Admitted now with decompensated cor pulmonale, pulmonary edema/volume overload. Suspect a large contributing factor is untreated obesity hypoventilation syndrome and obstructive sleep apnea. This is also no doubt complicated by medical noncompliance. This does not look like it is related to his sarcoidosis clinically he continues to appear hypercarbic. His last hydromorphone dose was at approximately 6:30 this a.m.  Plan/rec Continue supplemental oxygen insuring saturations greater than 88% Continue to encourage BiPAP at at bedtime Continue aggressive diuresis currently on Lasix drip, he is 5.7 L negative at this point, would continue to persist as long as his BUN/creatinine/blood pressure allowing Would be very careful with narcotic administration Will plan for high-resolution CT of chest after we have achieved euvolemic state Consider PET scan to rule out cardiac sarcoid Cardiology recommending transfer to [facility] for evaluation by advanced heart care team.  All other therapies per internal medicine and cardiology  11/08/2016, 9:07 AM

## 2016-11-08 NOTE — Progress Notes (Signed)
[facility] Progress Note  Subjective: no c/o, on bipap now  Vitals:   11/08/16 1012 11/08/16 1100 11/08/16 1153 11/08/16 1200  BP: (!) 161/83 (!) 170/79 (!) 159/74 (!) 161/85  Pulse:  91 89 86  Resp:  Temp:    99.2 F (37.3 C)  TempSrc:    Axillary  SpO2:  95%  99%  Weight:      Height:        Inpatient medications: . chlorhexidine  15 mL Mouth Rinse BID  . heparin  5,000 Units Subcutaneous Q8H  . mouth rinse  15 mL Mouth Rinse q12n4p  . predniSONE  50 mg Oral Q breakfast   . furosemide (LASIX) infusion 10 mg/hr (11/08/16 0600)   acetaminophen **OR** acetaminophen, hydrALAZINE, ondansetron **OR** ondansetron (ZOFRAN) IV, polyethylene glycol  Exam: RUE:AVWUJ AAM on bipap, responds w/ nods CVS:no rub Resp:decreased BS at bases WJX:BJYNW, +BS, soft, NT Ext:1+ pitting LE and flank edema  Home meds: -demadex 20 qd/ prinivil 20 qd/ lasix 40 qd -ibuprofen/ vicodin/ lipitor/ vistaril prn   Assessment: 1. AKI/CKD due to decompensated CHF.  Improving with diuresis and continue to hold ACE inhibitor. Creat down 1.8 > 1.5.   2. Decompensated CHF/Nephrotic syndrome- gained 25 lbs since 09/13/16.  Continue with IV lasix gtt for now. Still has LE / flank edema.  3. Pulmonary sarcoid- with hypoxic and hypercarbic respiratory failure; was on [place] now on bipap. Repeat CXR.  4. Hyperkalemia - resolved 5.   Disposition- poor living situation as he is in a homeless shelter.  Will need to involve SW/CM assist.  Plan - repeat CXR, should be improving w/ substantial diuresis.     Vinson Moselle MD [employer] pgr [phone]   11/08/2016, 2:55 PM    Recent Labs Lab 11/06/16 0324 11/07/16 0344 11/08/16 0327  NA 143 143 142  K 5.2* 4.6 4.5  CL 104 104 100*  CO2 33* 32 33*  GLUCOSE 102* 78 71  BUN 45* 41* 42*  CREATININE 1.62* 1.50* 1.44*  CALCIUM 7.7* 7.8* 8.1*    Recent Labs Lab 11/02/16 0841 11/05/16 1133  AST 36 37  ALT 38 35   ALKPHOS 124 125  BILITOT 1.1 0.9  PROT 6.1* 6.5  ALBUMIN 2.7* 3.0*    Recent Labs Lab 11/06/16 0324 11/07/16 0344 11/08/16 0327  WBC 6.1 4.9 6.4  NEUTROABS  --  3.2 4.2  HGB 14.4 14.2 14.8  HCT 51.1 50.3 51.6  MCV 89.3 87.0 85.9  PLT 133* 146* 132*   Iron/TIBC/Ferritin/ %Sat No results found for: IRON, TIBC, FERRITIN, IRONPCTSAT

## 2016-11-08 NOTE — Care Management Note (Signed)
Case Management Note  Patient Details  Name: Hunter [name] MRN: [id number] Date of Birth: 01-Jun-1970  Subjective/Objective:                   46 y.o. 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 Questionable obesity hypoventilation syndrome based on. Also may have obstructive sleep apnea. Started on noninvasive positive pressure ventilation and Lasix drip. Denies any history of polysomnogram or witnessed apneas. No steroid therapy for sarcoidosis for years. Reportedly no other organ involvement of sarcoidosis.  SUBJECTIVE:   Refusing BiPAP therapy at night. Belligerent and nursing staff, asking for pain medication. Complaining of left ankle pain  Action/Plan: Date:  November 08, 2016 Chart reviewed for concurrent status and case management needs.  Will continue to follow patient progress.  Discharge Planning: following for needs  Expected discharge date: November 11, 2016  Marcelle Smiling, BSN, [place], [place]   [phone]   Expected Discharge Date:   (unknown)               Expected Discharge Plan:  Home/Self Care  In-House Referral:     Discharge planning Services  CM Consult  Post Acute Care Choice:    Choice offered to:     DME Arranged:    DME Agency:     HH Arranged:    HH Agency:     Status of Service:  In process, will continue to follow  If discussed at Long Length of Stay Meetings, dates discussed:    Additional Comments:  Golda Acre, RN 11/08/2016, 9:45 AM

## 2016-11-08 NOTE — Progress Notes (Signed)
NUTRITION NOTE  Pt seen for initial assessment by RD yesterday with associated note at 4:32 PM. Order was placed for Premier Protein TID at that time. During rounds this AM, RN shared that pt is refusing Premier Protein. Pt is a possible transfer to [facility].  RD will d/c Premier and continue to follow per protocol.    Trenton Gammon, MS, RD, LDN, [facility] Inpatient Clinical Dietitian Pager # [phone] After hours/weekend pager # [phone]

## 2016-11-08 NOTE — Progress Notes (Signed)
Pt demanding Bipap be removed. Educated on purpose and need for bipap. Spoke with MD who stated it was okay to allow pt to have a break from Bipap until he is ready to go to sleep. Bipap removed at 1700 and placed on 3 L [place]; No signs of respiratory distress. Upon removing mask pt began screaming profanities and delusions at RN. Attempted to re-orient pt w/o success. Attempted to make pt comfortable in bed w/o success. Ice water and popscicle given and dinner tray ordered. Pt became progressively more agitated and combative. Attempted to re-orient pt w/o success. Will continue to attempt orientation and care and closely monitor pt.

## 2016-11-08 NOTE — Progress Notes (Signed)
  Case discussed with Dr. Donnie Aho.   47 y/o with multiple issus now with evidence of probable severe PAH on echo.   The AHF team would be happy to see patient and perform RHC but these services can only be provided at [facility]. If he agrees to proceed, we would ask that he be transferred to the [facility] service at [facility] and we will be glad to see him on his arrival.   Arvilla Meres, MD  1:28 PM

## 2016-11-08 NOTE — Progress Notes (Signed)
PROGRESS NOTE    Hunter [name]  ONG:[id number] DOB: 01-26-1971 DOA: 11/05/2016 PCP: Bing Neighbors, FNP     Brief Narrative:  Hunter [name] is a 46 y.o. male with medical history significant of sarcoidosis, chronic hypoxic respiratory failure, hypertension, FSGS, CKD stage III. He is a very poor historian. He states that he has been following with nephrology and was on Lasix twice a day. This was recently changed in the past few days by his new primary care physician. He states that since his medications have been changed, he noted decrease in urine output, worsening shortness of breath as well as increased swelling of his abdomen and lower extremities. He is very upset his primary care physician on why his medications have been changed. On chart review of his office records on 9/25, it appears that patient had already been complaining of abdominal bloating for a week at that time despite taking furosemide. His primary care physician had changed his medication from furosemide to torsemide. He is unsure what his kidney diseases from, unsure if he has ever been diagnosed with heart failure. He is supposed to be on home oxygen at baseline, but due to being at a homeless shelter, has not been using it in the past month. Labs were obtained which revealed BNP 1019. Chest x-ray revealed findings consistent with congestive heart failure. On morning of 9/29, he was found to be somnolent and was placed on BiPAP.    Assessment & Plan:   Active Problems:   CHF (congestive heart failure) (HCC)   Acute hypercapnic respiratory failure (HCC)   Pulmonary edema   Restrictive lung disease secondary to obesity   Obesity hypoventilation syndrome (HCC)   OSA (obstructive sleep apnea)   Acute on chronic respiratory failure with hypoxia and hypercapnia (HCC)   Right heart failure   -BNP elevated 1019, with chest x-ray findings of cardiomegaly, right pleural effusion with abdominal distention and lower extremity  edema  -Echocardiogram EF 55-60%, elevated RVSP  -Lasix IV gtt  -Cardiology following, spoke with Dr. Donnie Aho this morning. Recommending transfer to [facility] with advanced heart failure team consultation for right heart cath  -Daily weight, I/Os   Acute on chronic hypoxemic and hypercapnic respiratory failure -Has been inconsistently using his home oxygen due to residing at a homeless shelter currently -Has been off BiPAP > 24 hours. Patient more somnolent on exam this morning, ABG repeated with hypercarbia. 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  -Baseline Cr 0.92 in 09/13/2016   -Nephrology following   Hypomagnesemia -Replace  HLD -Continue lipitor   History of sarcoidosis -Has followed with pulmonology in the past, now off prednisone  Morbid obesity -Body mass index is 41.09 kg/m.   Homelessness -Consult social work  Possible sleep apnea -Has outpatient sleep study scheduled  Left ankle gout -Uric acid 11.9. Will give prednisone as patient has AKI    DVT prophylaxis: subq hep Code Status: full Family Communication: no family at bedside Disposition Plan: Once patient's mental status is improved and hypercarbia stable, will arrange for transfer to [facility] under [facility] team with advanced heart failure team consultation. Dr. Donnie Aho has spoken with heart failure team this morning.    Consultants:   Cardiology  Nephrology  PCCM  Procedures:   None  Antimicrobials:  Anti-infectives    None  Subjective: Overnight events reviewed, patient was apparently very belligerent towards staff. This morning on examination, he is calm, lethargic. He is able to be aroused with verbal and physical cues. However, patient does not stay awake long enough to answer  questions appropriately. He has no complaints otherwise.  Objective: Vitals:   11/08/16 1012 11/08/16 1100 11/08/16 1153 11/08/16 1200  BP: (!) 161/83 (!) 170/79 (!) 159/74 (!) 161/85  Pulse:  91 89 86  Resp:  Temp:      TempSrc:      SpO2:  95%  99%  Weight:      Height:        Intake/Output Summary (Last 24 hours) at 11/08/16 1230 Last data filed at 11/08/16 1200  Gross per 24 hour  Intake              280 ml  Output             3525 ml  Net            -3245 ml   Filed Weights   11/06/16 0119 11/06/16 0121 11/06/16 0352  Weight: (!) 137.5 kg (303 lb 2.1 oz) (!) 137.5 kg (303 lb 2.1 oz) (!) 137.1 kg (302 lb 4 oz)    Examination:  General exam: Appears calm and comfortable, lethargic  Respiratory system: Clear to auscultation. Respiratory effort normal. On [place] O2 Cardiovascular system: S1 & S2 heard, RRR. No JVD, murmurs, rubs, gallops or clicks. +edema with abdominal distention, improved  Gastrointestinal system: Abdomen is obese, soft and nontender. No organomegaly or masses felt. Normal bowel sounds heard. Central nervous system: Alert, nonfocal  Extremities: Symmetric in appearance, left ankle with edema  Skin: No rashes, lesions or ulcers  Data Reviewed: I have personally reviewed following labs and imaging studies  CBC:  Recent Labs Lab 11/02/16 0841 11/06/16 0324 11/07/16 0344 11/08/16 0327  WBC 5.2 6.1 4.9 6.4  NEUTROABS  --   --  3.2 4.2  HGB 15.5 14.4 14.2 14.8  HCT 52.2* 51.1 50.3 51.6  MCV 86.0 89.3 87.0 85.9  PLT 152 133* 146* 132*   Basic Metabolic Panel:  Recent Labs Lab 11/05/16 1133 11/05/16 1414 11/05/16 1420 11/06/16 0324 11/07/16 0344 11/08/16 0327  NA 142 143  --  143 143 142  K 5.0 5.7*  --  5.2* 4.6 4.5  CL 106 103  --  104 104 100*  CO2 28 33*  --  33* 32 33*  GLUCOSE 124* 95  --  102* 78 71  BUN 46* 47*  --  45* 41* 42*  CREATININE 1.48* 1.52*  --  1.62* 1.50* 1.44*  CALCIUM 8.0* 8.0*  --  7.7* 7.8* 8.1*  MG  --    --  2.1  --  1.6* 1.6*   GFR: Estimated Creatinine Clearance: 90 mL/min (A) (by C-G formula based on SCr of 1.44 mg/dL (H)). Liver Function Tests:  Recent Labs Lab 11/02/16 0841 11/05/16 1133  AST 36 37  ALT 38 35  ALKPHOS 124 125  BILITOT 1.1 0.9  PROT 6.1* 6.5  ALBUMIN 2.7* 3.0*   No results for input(s): LIPASE, AMYLASE in the last 168 hours. No results for input(s): AMMONIA in the last 168 hours. Coagulation Profile: No results for input(s): INR, PROTIME in the last 168 hours. Cardiac Enzymes: No results for input(s): CKTOTAL, CKMB, CKMBINDEX, TROPONINI in the last 168 hours. BNP (last 3 results) No results for input(s): PROBNP in the last 8760  hours. HbA1C: No results for input(s): HGBA1C in the last 72 hours. CBG: No results for input(s): GLUCAP in the last 168 hours. Lipid Profile: No results for input(s): CHOL, HDL, LDLCALC, TRIG, CHOLHDL, LDLDIRECT in the last 72 hours. Thyroid Function Tests:  Recent Labs  11/06/16 0803  TSH 1.427   Anemia Panel: No results for input(s): VITAMINB12, FOLATE, FERRITIN, TIBC, IRON, RETICCTPCT in the last 72 hours. Sepsis Labs: No results for input(s): PROCALCITON, LATICACIDVEN in the last 168 hours.  Recent Results (from the past 240 hour(s))  MRSA PCR Screening     Status: None   Collection Time: 11/06/16  1:40 AM  Result Value Ref Range Status   MRSA by PCR NEGATIVE NEGATIVE Final    Comment:        The GeneXpert MRSA Assay (FDA approved for NASAL specimens only), is one component of a comprehensive MRSA colonization surveillance program. It is not intended to diagnose MRSA infection nor to guide or monitor treatment for MRSA infections.        Radiology Studies: No results found.    Scheduled Meds: . chlorhexidine  15 mL Mouth Rinse BID  . heparin  5,000 Units Subcutaneous Q8H  . mouth rinse  15 mL Mouth Rinse q12n4p  . predniSONE  50 mg Oral Q breakfast   Continuous Infusions: . furosemide (LASIX)  infusion 10 mg/hr (11/08/16 0600)     LOS: 3 days    Time spent: 40 minutes   Noralee Stain, DO [facility] [email or web address] Password [email or web address] 11/08/2016, 12:30 PM

## 2016-11-09 DIAGNOSIS — L8990 Pressure ulcer of unspecified site, unspecified stage: Secondary | ICD-10-CM | POA: Insufficient documentation

## 2016-11-09 LAB — CBC WITH DIFFERENTIAL/PLATELET
BASOS ABS: 0 10*3/uL (ref 0.0–0.1)
Basophils Relative: 0 %
EOS PCT: 0 %
Eosinophils Absolute: 0 10*3/uL (ref 0.0–0.7)
HEMATOCRIT: 51.2 % (ref 39.0–52.0)
HEMOGLOBIN: 15.6 g/dL (ref 13.0–17.0)
LYMPHS PCT: 5 %
Lymphs Abs: 0.3 10*3/uL — ABNORMAL LOW (ref 0.7–4.0)
MCH: 25.4 pg — ABNORMAL LOW (ref 26.0–34.0)
MCHC: 30.5 g/dL (ref 30.0–36.0)
MCV: 83.5 fL (ref 78.0–100.0)
MONOS PCT: 16 %
Monocytes Absolute: 0.9 10*3/uL (ref 0.1–1.0)
NEUTROS ABS: 4.6 10*3/uL (ref 1.7–7.7)
Neutrophils Relative %: 79 %
Platelets: 135 10*3/uL — ABNORMAL LOW (ref 150–400)
RBC: 6.13 MIL/uL — AB (ref 4.22–5.81)
RDW: 19.1 % — ABNORMAL HIGH (ref 11.5–15.5)
WBC: 5.8 10*3/uL (ref 4.0–10.5)

## 2016-11-09 LAB — BLOOD GAS, ARTERIAL
Acid-Base Excess: 7.8 mmol/L — ABNORMAL HIGH (ref 0.0–2.0)
Bicarbonate: 37 mmol/L — ABNORMAL HIGH (ref 20.0–28.0)
DRAWN BY: 51425
FIO2: 36
O2 SAT: 90.7 %
PATIENT TEMPERATURE: 98
PO2 ART: 64 mmHg — AB (ref 83.0–108.0)
pCO2 arterial: 74.6 mmHg (ref 32.0–48.0)
pH, Arterial: 7.315 — ABNORMAL LOW (ref 7.350–7.450)

## 2016-11-09 LAB — BASIC METABOLIC PANEL
ANION GAP: 9 (ref 5–15)
BUN: 44 mg/dL — ABNORMAL HIGH (ref 6–20)
CHLORIDE: 98 mmol/L — AB (ref 101–111)
CO2: 34 mmol/L — AB (ref 22–32)
Calcium: 8.2 mg/dL — ABNORMAL LOW (ref 8.9–10.3)
Creatinine, Ser: 1.24 mg/dL (ref 0.61–1.24)
GFR calc non Af Amer: >60 mL/min (ref >60)
Glucose, Bld: 108 mg/dL — ABNORMAL HIGH (ref 65–99)
Potassium: 4.9 mmol/L (ref 3.5–5.1)
Sodium: 141 mmol/L (ref 135–145)

## 2016-11-09 LAB — MAGNESIUM: Magnesium: 1.8 mg/dL (ref 1.7–2.4)

## 2016-11-09 MED ORDER — TRAMADOL HCL 50 MG PO TABS
50.0000 mg | ORAL_TABLET | Freq: Once | ORAL | Status: AC
  Administered 2016-11-09: 50 mg via ORAL
  Filled 2016-11-09: qty 1

## 2016-11-09 MED ORDER — METOLAZONE 5 MG PO TABS
5.0000 mg | ORAL_TABLET | Freq: Two times a day (BID) | ORAL | Status: DC
  Administered 2016-11-09 – 2016-11-11 (×4): 5 mg via ORAL
  Filled 2016-11-09 (×5): qty 1

## 2016-11-09 NOTE — Progress Notes (Signed)
Subjective:  Patient is belligerent and hostile this morning.  I was able to get him calm down but he has been refusing to wear BiPAP and has been aggressive and belligerent with the nurses.  In particular he does not have confidence in his nurse here and repeatedly and tangentially brings this ended the conversation.  He is willing to be transferred to [facility]..  He says that he is not short of breath at the present time.  No chest pain.  Objective:  Vital Signs in the last 24 hours: BP (!) 158/97   Pulse 84   Temp 98.4 F (36.9 C)   Resp (!) 22   Ht 6' (1.829 m)   Wt 131.7 kg (290 lb 5.5 oz)   SpO2 99%   BMI 39.38 kg/m   Physical Exam: Agitated appearing black male somewhat circuitous historian  Lungs:  Clear  Cardiac:  Regular rhythm, normal S1 and S2, no S3 Abdomen:  Soft, nontender, no masses Extremities: 1+ edema present  Intake/Output from previous day: 10/01 0701 - 10/02 0700 In: 450 [P.O.:180; I.V.:220; IV Piggyback:50] Out: 3610 [Urine:3610]   Weight Filed Weights   11/06/16 0121 11/06/16 0352 11/09/16 0500  Weight: (!) 137.5 kg (303 lb 2.1 oz) (!) 137.1 kg (302 lb 4 oz) 131.7 kg (290 lb 5.5 oz)    Lab Results: Basic Metabolic Panel:  Recent Labs  29/52/84 0327 11/09/16 0330  NA 142 141  K 4.5 4.9  CL 100* 98*  CO2 33* 34*  GLUCOSE 71 108*  BUN 42* 44*  CREATININE 1.44* 1.24    CBC:  Recent Labs  11/08/16 0327 11/09/16 0330  WBC 6.4 5.8  NEUTROABS 4.2 4.6  HGB 14.8 15.6  HCT 51.6 51.2  MCV 85.9 83.5  PLT 132* 135*    BNP    Component Value Date/Time   BNP 1,019.5 (H) 11/05/2016 1414   BNP CANCELED 10/26/2016 1139   Telemetry: Sinus rhythm  Assessment/Plan:   1.  Cor pulmonale with evidence of right heart pressure elevation with septal flattening on the echo.  He has never really had much in the way of a cardiovascular workup because he has not returned to get this worked up.  I suspect that this is his major problem and its  unclear whether the etiology of this is primary pulmonary hypertension, due to sarcoidosis, or due to obstructive sleep apnea and obesity hypoventilation syndrome.  He is agreeable to transfer to [facility] where he should be seen by the advanced heart failure team.  He will need to have attention to his weight as well as oxygen therapy.  Weight is down today if it can be believed and he still is diuresing.  He really needs close pulmonary follow-up and needs to have treatment of his weight and hypoxemia as hypoxemia will continue to worsen this.  Ideally he needs to have a right heart cath and hopefully will be agreeable to this.. 2.  Nephrotic syndrome under treatment by the nephrologists 3.  Morbid obesity 4.  Ongoing tobacco abuse  Recommendations:  Transfer to [facility] today.  When he gets to [facility] consult the advanced heart failure team.  I have already spoken to them..  I suspect this appears to be mostly a right heart problem and I suspect that this is multifactorial due to sleep apnea, obesity hypoventilation syndrome and possibly the contribution of sarcoidosis.   Darden Palmer  MD [facility]  11/09/2016, 8:11 AM

## 2016-11-09 NOTE — Progress Notes (Signed)
[facility] Progress Note  Subjective: 3.6 L UOP yest, net I/O negative 3.1 L.  Creat stable today, wt down 131kg today (137 on admit).  CXR worsening CHF from yest.    Vitals:   11/09/16 1200 11/09/16 1300 11/09/16 1400 11/09/16 1500  BP: (!) 157/92 (!) 171/89 (!) 151/86 (!) 152/85  Pulse: 88 95 89 88  Resp: (!) 27 (!) 26 (!) 27 (!) 23  Temp: 98.8 F (37.1 C) 99 F (37.2 C) 98.8 F (37.1 C) 98.6 F (37 C)  TempSrc:      SpO2: 92% (!) 87% 93% 92%  Weight:      Height:        Inpatient medications: . chlorhexidine  15 mL Mouth Rinse BID  . heparin  5,000 Units Subcutaneous Q8H  . mouth rinse  15 mL Mouth Rinse q12n4p  . predniSONE  50 mg Oral Q breakfast   . furosemide (LASIX) infusion 10 mg/hr (11/08/16 1537)   acetaminophen **OR** acetaminophen, hydrALAZINE, ondansetron **OR** ondansetron (ZOFRAN) IV, polyethylene glycol  Exam: WJX:BJYNW AAM on bipap, responds w/ nods CVS:no rub Resp:decreased BS at bases GNF:AOZHY, +BS, soft, NT Ext:1+ pitting LE and flank edema  Home meds: -demadex 20 qd/ prinivil 20 qd/ lasix 40 qd -ibuprofen/ vicodin/ lipitor/ vistaril prn   Assessment: 1.  Acute / CKD - due to decomp CHF.  Improved creat at 1.2 2.  CHF/ pulm edema/ vol overload - still w CHF by CXR and LE edema. Cont lasix gtt , will add metolazone.  3.  Pulm sarcoid - severe pulm HTN, for Tx to [facility] for CHF eval 4.  Social - homeless  Plan - as above    Vinson Moselle MD [employer] pgr [phone]   11/08/2016, 2:55 PM    Recent Labs Lab 11/07/16 0344 11/08/16 0327 11/09/16 0330  NA 143 142 141  K 4.6 4.5 4.9  CL 104 100* 98*  CO2 32 33* 34*  GLUCOSE 78 71 108*  BUN 41* 42* 44*  CREATININE 1.50* 1.44* 1.24  CALCIUM 7.8* 8.1* 8.2*    Recent Labs Lab 11/05/16 1133  AST 37  ALT 35  ALKPHOS 125  BILITOT 0.9  PROT 6.5  ALBUMIN 3.0*    Recent Labs Lab 11/07/16 0344 11/08/16 0327 11/09/16 0330  WBC 4.9 6.4 5.8   NEUTROABS 3.2 4.2 4.6  HGB 14.2 14.8 15.6  HCT 50.3 51.6 51.2  MCV 87.0 85.9 83.5  PLT 146* 132* 135*   Iron/TIBC/Ferritin/ %Sat No results found for: IRON, TIBC, FERRITIN, IRONPCTSAT

## 2016-11-09 NOTE — Progress Notes (Signed)
Patient refused to go on the BIPAP tonight, he asked to know what is going on with him. He is upset that no one is telling him anything. Of note, I was the charge nurse on Saturday when he became belligerent with his nurse, complaining of the same thing "no one is telling me what is wrong with me". At that time I spent some time educating him on his diagnosis of heart failure, and also on the issue of him retaining CO2 thereby requiring BIPAP. Patient answers orientation questions appropriately but his cognition and reasoning is somewhat questionable, as he tends to ask the same questions over again even when answer has been given.

## 2016-11-09 NOTE — Progress Notes (Signed)
PROGRESS NOTE    Hunter [name]  ZOX:[id number] DOB: 20-Jul-1970 DOA: 11/05/2016 PCP: Bing Neighbors, FNP     Brief Narrative:  Hunter [name] is a 46 y.o. male with medical history significant of sarcoidosis, chronic hypoxic respiratory failure, hypertension, FSGS, CKD stage III. He is a very poor historian. He states that he has been following with nephrology and was on Lasix twice a day. This was recently changed in the past few days by his new primary care physician. He states that since his medications have been changed, he noted decrease in urine output, worsening shortness of breath as well as increased swelling of his abdomen and lower extremities. He is very upset his primary care physician on why his medications have been changed. On chart review of his office records on 9/25, it appears that patient had already been complaining of abdominal bloating for a week at that time despite taking furosemide. His primary care physician had changed his medication from furosemide to torsemide. He is unsure what his kidney diseases from, unsure if he has ever been diagnosed with heart failure. He is supposed to be on home oxygen at baseline, but due to being at a homeless shelter, has not been using it in the past month. Labs were obtained which revealed BNP 1019. Chest x-ray revealed findings consistent with congestive heart failure. On morning of 9/29, he was found to be somnolent and was placed on BiPAP. He has been on lasix gtt with diuresis. He has been evaluated by PCCM, cardiology, nephrology. Cardiology has recommended transfer to [facility] for advanced heart failure team consultation.  Assessment & Plan:   Active Problems:   CHF (congestive heart failure) (HCC)   Acute hypercapnic respiratory failure (HCC)   Pulmonary edema   Restrictive lung disease secondary to obesity   Obesity hypoventilation syndrome (HCC)   OSA (obstructive sleep apnea)   Acute on chronic respiratory failure with  hypoxia and hypercapnia (HCC)   Acute right heart failure   -BNP elevated 1019, with chest x-ray findings of cardiomegaly, right pleural effusion with abdominal distention and lower extremity edema  -Echocardiogram EF 55-60%, elevated RVSP  -Lasix IV gtt  -Cardiology following, spoke with Dr. Donnie Aho. Recommending transfer to [facility] with advanced heart failure team consultation for right heart cath. Heart failure team aware of patient.  -Daily weight, I/Os   Acute on chronic hypoxemic and hypercapnic respiratory failure -Has been inconsistently using his home oxygen due to residing at a homeless shelter currently -Has been on and off BiPAP. He has been intermittently refusing BiPAP. ABG this morning off BiPAP is improved, patient educated on need for BiPAP. He is much more awake, alert, and verbal this morning.   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  -Baseline Cr 0.92 in 09/13/2016   -Nephrology following   HLD -Continue lipitor   History of sarcoidosis -Has followed with pulmonology in the past, now off prednisone  Morbid obesity -Body mass index is 41.09 kg/m.   Homelessness -Consult social work  Possible sleep apnea -Has outpatient sleep study scheduled -He needs to wear BiPAP qhs while in hospital due to risk of hypercarbia   Left ankle gout -Uric acid 11.9. Will give prednisone as patient has AKI    DVT prophylaxis: subq hep Code Status: full Family Communication: no family at bedside Disposition Plan: Transfer to [facility] under [facility]  team with advanced heart failure team consultation   Consultants:   Cardiology  Nephrology  PCCM  Procedures:   None  Antimicrobials:  Anti-infectives    None       Subjective: Overnight events reviewed. He has been refusing  BiPAP. This morning, he is sleeping but easily awoken. He is rude to staff. I discussed with him his medical conditions, which he demonstrates poor insight and understanding. He states that because he is homeless, staff here are not treating him well. He states that no one has explained anything to him. However, there are 4 providers seeing him daily as well as nursing staff who have explained his medical conditions to him on a daily basis. He denies that he is rude to staff and states that he is in the hospital and that we should be catering to his needs. He has no physical complaints, no pain or SOB. He is agreeable to transfer to [facility] for further cardiac work up and treatment.    Objective: Vitals:   11/09/16 0400 11/09/16 0500 11/09/16 0600 11/09/16 0700  BP: 139/89 (!) 166/104 (!) 156/95 (!) 158/97  Pulse: 83 88 80 84  Resp: 20 20 (!) 23 (!) 22  Temp: 98.1 F (36.7 C) 98.2 F (36.8 C) 98.2 F (36.8 C) 98.4 F (36.9 C)  TempSrc:      SpO2: 99% 100% 100% 99%  Weight:  131.7 kg (290 lb 5.5 oz)    Height:        Intake/Output Summary (Last 24 hours) at 11/09/16 0811 Last data filed at 11/09/16 0630  Gross per 24 hour  Intake              380 ml  Output             3260 ml  Net            -2880 ml   Filed Weights   11/06/16 0121 11/06/16 0352 11/09/16 0500  Weight: (!) 137.5 kg (303 lb 2.1 oz) (!) 137.1 kg (302 lb 4 oz) 131.7 kg (290 lb 5.5 oz)    Examination:  General exam: Appears calm and comfortable Respiratory system: Respiratory effort normal. On [place] O2 Cardiovascular system: S1 & S2 heard, RRR. No JVD, murmurs, rubs, gallops or clicks. +edema with abdominal distention Gastrointestinal system: Abdomen is obese, soft and nontender. No organomegaly or masses felt. Normal bowel sounds heard. Central nervous system: Alert, nonfocal  Extremities: Symmetric in appearance, left ankle with edema which has decreased since exam yesterday  Skin: No rashes, lesions or ulcers Psych:  Very poor insight   Data Reviewed: I have personally reviewed following labs and imaging studies  CBC:  Recent Labs Lab 11/02/16 0841 11/06/16 0324 11/07/16 0344 11/08/16 0327 11/09/16 0330  WBC 5.2 6.1 4.9 6.4 5.8  NEUTROABS  --   --  3.2 4.2 4.6  HGB 15.5 14.4 14.2 14.8 15.6  HCT 52.2* 51.1 50.3 51.6 51.2  MCV 86.0 89.3 87.0 85.9 83.5  PLT 152 133* 146* 132* 135*   Basic Metabolic Panel:  Recent Labs Lab 11/05/16 1414 11/05/16 1420 11/06/16 0324 11/07/16 0344 11/08/16 0327 11/09/16 0330  NA 143  --  143 143 142 141  K 5.7*  --  5.2* 4.6 4.5 4.9  CL 103  --  104 104 100* 98*  CO2 33*  --  33* 32 33* 34*  GLUCOSE 95  --  102* 78 71 108*  BUN 47*  --  45* 41* 42*  44*  CREATININE 1.52*  --  1.62* 1.50* 1.44* 1.24  CALCIUM 8.0*  --  7.7* 7.8* 8.1* 8.2*  MG  --  2.1  --  1.6* 1.6* 1.8   GFR: Estimated Creatinine Clearance: 102.2 mL/min (by C-G formula based on SCr of 1.24 mg/dL). Liver Function Tests:  Recent Labs Lab 11/02/16 0841 11/05/16 1133  AST 36 37  ALT 38 35  ALKPHOS 124 125  BILITOT 1.1 0.9  PROT 6.1* 6.5  ALBUMIN 2.7* 3.0*   No results for input(s): LIPASE, AMYLASE in the last 168 hours. No results for input(s): AMMONIA in the last 168 hours. Coagulation Profile: No results for input(s): INR, PROTIME in the last 168 hours. Cardiac Enzymes: No results for input(s): CKTOTAL, CKMB, CKMBINDEX, TROPONINI in the last 168 hours. BNP (last 3 results) No results for input(s): PROBNP in the last 8760 hours. HbA1C: No results for input(s): HGBA1C in the last 72 hours. CBG: No results for input(s): GLUCAP in the last 168 hours. Lipid Profile: No results for input(s): CHOL, HDL, LDLCALC, TRIG, CHOLHDL, LDLDIRECT in the last 72 hours. Thyroid Function Tests: No results for input(s): TSH, T4TOTAL, FREET4, T3FREE, THYROIDAB in the last 72 hours. Anemia Panel: No results for input(s): VITAMINB12, FOLATE, FERRITIN, TIBC, IRON, RETICCTPCT in the last 72  hours. Sepsis Labs: No results for input(s): PROCALCITON, LATICACIDVEN in the last 168 hours.  Recent Results (from the past 240 hour(s))  MRSA PCR Screening     Status: None   Collection Time: 11/06/16  1:40 AM  Result Value Ref Range Status   MRSA by PCR NEGATIVE NEGATIVE Final    Comment:        The GeneXpert MRSA Assay (FDA approved for NASAL specimens only), is one component of a comprehensive MRSA colonization surveillance program. It is not intended to diagnose MRSA infection nor to guide or monitor treatment for MRSA infections.        Radiology Studies: Dg Chest 1 View  Result Date: 11/08/2016 CLINICAL DATA:  Hypercapnic respiratory failure. History of sarcoidosis. EXAM: CHEST 1 VIEW COMPARISON:  November 05, 2016 FINDINGS: There is a moderate partially loculated right pleural effusion. There is increase in interstitial and patchy alveolar edema with increased alveolar opacity primarily on the right. There is cardiomegaly with pulmonary venous hypertension. No adenopathy. No evident bone lesions. IMPRESSION: Evidence of congestive heart failure with increase in edema and slight increase in right pleural effusion. Stable cardiac enlargement. Electronically Signed   By: Bretta Bang III M.D.   On: 11/08/2016 16:25      Scheduled Meds: . chlorhexidine  15 mL Mouth Rinse BID  . heparin  5,000 Units Subcutaneous Q8H  . mouth rinse  15 mL Mouth Rinse q12n4p  . predniSONE  50 mg Oral Q breakfast   Continuous Infusions: . furosemide (LASIX) infusion 10 mg/hr (11/08/16 1537)     LOS: 4 days    Time spent: 30 minutes   Noralee Stain, DO [facility] [email or web address] Password [email or web address] 11/09/2016, 8:11 AM

## 2016-11-09 NOTE — Progress Notes (Signed)
Name: Hunter [name] MRN: [id number] DOB: 07/14/1970    ADMISSION DATE:  11/05/2016   CONSULTATION DATE:  11/06/2016  REFERRING MD :  Dr Alvino Chapel  CHIEF COMPLAINT:  Acute hypercapnic respiratory failure  BRIEF PATIENT DESCRIPTION: 46 y.o. 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 Questionable obesity hypoventilation syndrome based on. Also may have obstructive sleep apnea. Started on noninvasive positive pressure ventilation and Lasix drip. Denies any history of polysomnogram or witnessed apneas. No steroid therapy for sarcoidosis for years. Reportedly no other organ involvement of sarcoidosis.  SUBJECTIVE:  Patient again belligerent towards staff this morning. Refusing to wear BiPAP. The time of my exam he denied any dyspnea or cough. He denied chest pain or pressure.  REVIEW OF SYSTEMS:  No abdominal pain or nausea. No subjective fever or chills.  VITAL SIGNS: Temp:  [98.1 F (36.7 C)-100.2 F (37.9 C)] 98.2 F (36.8 C) (10/02 0800) Pulse Rate:  [80-100] 85 (10/02 0800) Resp:  [11-32] 21 (10/02 0800) BP: (134-173)/(62-104) 139/83 (10/02 0800) SpO2:  [90 %-100 %] 100 % (10/02 0800) FiO2 (%):  [36 %-40 %] 36 % (10/01 2100) Weight:  [290 lb 5.5 oz (131.7 kg)] 290 lb 5.5 oz (131.7 kg) (10/02 0500)  PHYSICAL EXAMINATION: General:  Awake. Alert. No acute distress.  Integument:  No rash on exposed skin. No bruising on exposed skin. Warm. Dry. Extremities:  No cyanosis or clubbing.  HEENT:  Moist mucus membranes.No scleral injection or icterus.  Cardiovascular:  Regular rate. Unable to appreciate JVD.  Pulmonary:  Minimally decreased breath sounds in the bases. Normal work of breathing on nasal cannula. Abdomen: Soft. Normal bowel sounds. Protuberant 2-12 grossly in tact. Grossly nonfocal. No meningismus.    Recent Labs Lab 11/07/16 0344 11/08/16 0327 11/09/16 0330  NA 143 142 141  K 4.6  4.5 4.9  CL 104 100* 98*  CO2 32 33* 34*  BUN 41* 42* 44*  CREATININE 1.50* 1.44* 1.24  GLUCOSE 78 71 108*    Recent Labs Lab 11/07/16 0344 11/08/16 0327 11/09/16 0330  HGB 14.2 14.8 15.6  HCT 50.3 51.6 51.2  WBC 4.9 6.4 5.8  PLT 146* 132* 135*   ABG    Component Value Date/Time   PHART 7.315 (L) 11/09/2016 0419   PCO2ART 74.6 (HH) 11/09/2016 0419   PO2ART 64.0 (L) 11/09/2016 0419   HCO3 37.0 (H) 11/09/2016 0419   ACIDBASEDEF 1.8 11/06/2016 0825   O2SAT 90.7 11/09/2016 0419   Dg Chest 1 View  Result Date: 11/08/2016 CLINICAL DATA:  Hypercapnic respiratory failure. History of sarcoidosis. EXAM: CHEST 1 VIEW COMPARISON:  November 05, 2016 FINDINGS: There is a moderate partially loculated right pleural effusion. There is increase in interstitial and patchy alveolar edema with increased alveolar opacity primarily on the right. There is cardiomegaly with pulmonary venous hypertension. No adenopathy. No evident bone lesions. IMPRESSION: Evidence of congestive heart failure with increase in edema and slight increase in right pleural effusion. Stable cardiac enlargement. Electronically Signed   By: Bretta Bang III M.D.   On: 11/08/2016 16:25   IMAGING/STUDIES: PFT 02/12/16: FVC 1.72 L (38%) FEV1 1.23 L (34%) FEV1/FVC 0 point someone FEF 25-75 0.74 L (20%) negative bronchodilator response TLC 3.99 L (55%) RV 105% DLCO corrected 62% TTE 9/29:  LV normal in size with EF 55-60% & normal regional wall motion. LA normal in size & RA severely dilated. RV dilated with moderately reduced systolic function.  Pulmonary artery systolic pressure 32 mmHg. No aortic stenosis or regurgitation. Mild aortic root dilatation. No mitral stenosis or regurgitation. Mild pulmonic regurgitation with poorly visualized valve. Mild tricuspid regurgitation. No pericardial effusion. CXR PA/LAT 9/28:  Previously reviewed by me. Low lung volumes. Patchy bilateral lower lung predominant alveolar opacification as well  as blunting of costophrenic angles consistent with pleural effusions. Fluid within right fissure as well. Borderline cardiomegaly with normal mediastinal and are contours. PORT CXR 10/1:  Personally reviewed by me. Regarding view. Some improvement in opacification in the right lower lung. Questionable slight increase in right pleural effusion. No new focal opacity. Mildly increased interstitial markings bilaterally.   MICROBIOLOGY: MRSA PCR 9/29:  Negative  ANTIBIOTICS:  LINES/TUBES: Foley 9/29 >>> PIV  SIGNIFICANT EVENTS: 09/28 - Admit  ASSESSMENT / PLAN:   46 y.o. Male with acute on chronic hypercarbic respiratory failure and acute on chronic hypoxic respiratory failure. No evidence of sarcoidosis flare. Pulmonary hypertension suggested by echo. Plan for transfer to [facility] today for evaluation and evaluation for left and right heart catheterization.  1. Acute on chronic hypercarbic respiratory failure: Recommended continued use of nocturnal BiPAP patient. Likely will need outpatient titration. Preventing hyperoxygenation to limit V/Q mismatching. 2. Acute on chronic hypoxic respiratory failure: Diuresis as per cardiology. Target saturation 88-94% to prevent V/Q mismatching. 3. Moderate restrictive lung disease: Likely secondary to obesity and congestive heart failure. Plan for high-resolution CT of the chest after diuresis. 4. Sarcoidosis: No evidence of acute flare. Plan for cardiac PET/CT as an outpatient. 5. Pulmonary hypertension: Suggested by echocardiogram. Plan for transfer to [facility] for left and right heart catheterization.   Remainder of care as per primary service and other consultants.  Donna Christen Jamison Neighbor, M.D. [facility] Pager:  [phone] After 7pm or if no response, call [phone] 11/09/2016, 8:56 AM

## 2016-11-10 DIAGNOSIS — M109 Gout, unspecified: Secondary | ICD-10-CM

## 2016-11-10 DIAGNOSIS — J90 Pleural effusion, not elsewhere classified: Secondary | ICD-10-CM

## 2016-11-10 DIAGNOSIS — I5032 Chronic diastolic (congestive) heart failure: Secondary | ICD-10-CM

## 2016-11-10 DIAGNOSIS — N179 Acute kidney failure, unspecified: Secondary | ICD-10-CM

## 2016-11-10 DIAGNOSIS — R7303 Prediabetes: Secondary | ICD-10-CM

## 2016-11-10 DIAGNOSIS — I50810 Right heart failure, unspecified: Secondary | ICD-10-CM

## 2016-11-10 DIAGNOSIS — D860 Sarcoidosis of lung: Secondary | ICD-10-CM

## 2016-11-10 DIAGNOSIS — N183 Chronic kidney disease, stage 3 (moderate): Secondary | ICD-10-CM

## 2016-11-10 LAB — BASIC METABOLIC PANEL
Anion gap: 6 (ref 5–15)
BUN: 41 mg/dL — AB (ref 6–20)
CO2: 44 mmol/L — AB (ref 22–32)
Calcium: 8.6 mg/dL — ABNORMAL LOW (ref 8.9–10.3)
Chloride: 91 mmol/L — ABNORMAL LOW (ref 101–111)
Creatinine, Ser: 1.26 mg/dL — ABNORMAL HIGH (ref 0.61–1.24)
GFR calc Af Amer: >60 mL/min (ref >60)
GLUCOSE: 134 mg/dL — AB (ref 65–99)
POTASSIUM: 3.8 mmol/L (ref 3.5–5.1)
Sodium: 141 mmol/L (ref 135–145)

## 2016-11-10 LAB — HEMOGLOBIN A1C
HEMOGLOBIN A1C: 6.3 % — AB (ref 4.8–5.6)
Mean Plasma Glucose: 134.11 mg/dL

## 2016-11-10 LAB — LIPID PANEL
CHOLESTEROL: 168 mg/dL (ref 0–200)
HDL: 60 mg/dL (ref >40)
LDL CALC: 90 mg/dL (ref 0–99)
TRIGLYCERIDES: 88 mg/dL (ref <150)
Total CHOL/HDL Ratio: 2.8 RATIO
VLDL: 18 mg/dL (ref 0–40)

## 2016-11-10 LAB — MAGNESIUM: Magnesium: 1.4 mg/dL — ABNORMAL LOW (ref 1.7–2.4)

## 2016-11-10 LAB — MRSA PCR SCREENING: MRSA by PCR: NEGATIVE

## 2016-11-10 MED ORDER — ASPIRIN 81 MG PO CHEW: 81.0000 mg | CHEWABLE_TABLET | ORAL | Status: DC

## 2016-11-10 MED ORDER — SODIUM CHLORIDE 0.9% FLUSH
3.0000 mL | Freq: Two times a day (BID) | INTRAVENOUS | Status: DC
  Administered 2016-11-10 – 2016-11-11 (×2): 3 mL via INTRAVENOUS

## 2016-11-10 MED ORDER — SODIUM CHLORIDE 0.9 % IV SOLN: 250.0000 mL | INTRAVENOUS | Status: DC | PRN

## 2016-11-10 MED ORDER — SODIUM CHLORIDE 0.9 % IV SOLN
INTRAVENOUS | Status: DC
  Administered 2016-11-11: 06:00:00 via INTRAVENOUS

## 2016-11-10 MED ORDER — SODIUM CHLORIDE 0.9% FLUSH
3.0000 mL | Freq: Two times a day (BID) | INTRAVENOUS | Status: DC
  Administered 2016-11-10 – 2016-11-11 (×3): 3 mL via INTRAVENOUS

## 2016-11-10 MED ORDER — ASPIRIN 81 MG PO CHEW
81.0000 mg | CHEWABLE_TABLET | ORAL | Status: AC
  Administered 2016-11-11: 81 mg via ORAL
  Filled 2016-11-10: qty 1

## 2016-11-10 MED ORDER — SODIUM CHLORIDE 0.9 % IV SOLN: INTRAVENOUS | Status: DC

## 2016-11-10 MED ORDER — SODIUM CHLORIDE 0.9% FLUSH: 3.0000 mL | INTRAVENOUS | Status: DC | PRN

## 2016-11-10 NOTE — Progress Notes (Signed)
Chart reviewed.  Await R/L heart cath.  Will follow up once completed.   Canary Brim, NP-C [facility] Pgr: [phone] or if no answer [phone] 11/10/2016, 12:55 PM

## 2016-11-10 NOTE — Progress Notes (Signed)
Pt stating that he has not received any risks/benefits information from MD regarding his planned cardiac cath for tomorrow. At this time, pt is refusing to sign consent. Also stating he wants the information provided to a cousin via a phone call by a doctor. Pt briefly verbally educated on the cath procedure and educational video presented. Pt advised if he had any further questions/concerns to let staff know. Will need follow up with AM rounding staff regarding procedure.  Viviano Simas, RN

## 2016-11-10 NOTE — Progress Notes (Signed)
Subjective:  He was transferred to [facility].  Appreciate Dr. Alford Highland consultation.  Diuresing well now and his weight is now down.  Still hypoxemic requiring oxygen.  He says that he feels fine and wants to know why he is short of breath and is weak going upstairs.  I again explained to him about right heart failure and underlying lung disease as the cause and that the advanced heart failure team will be taking over his management to help him with this.  Objective:  Vital Signs in the last 24 hours: BP (!) 143/82 (BP Location: Right Arm)   Pulse 91   Temp 98.1 F (36.7 C) (Oral)   Resp 18   Ht 6' (1.829 m)   Wt 129.4 kg (285 lb 4.8 oz)   SpO2 90%   BMI 38.69 kg/m   Physical Exam: Calmer appearing appearing black male somewhat circuitous historian but in no acute distress today Lungs:  Clear  Cardiac:  Regular rhythm, normal S1 and S2, no S3 Extremities: 1+ edema present  Intake/Output from previous day: 10/02 0701 - 10/03 0700 In: 1850 [P.O.:1640; I.V.:210] Out: 7550 [Urine:7550]   Weight Filed Weights   11/06/16 0352 11/09/16 0500 11/10/16 0000  Weight: (!) 137.1 kg (302 lb 4 oz) 131.7 kg (290 lb 5.5 oz) 129.4 kg (285 lb 4.8 oz)    Lab Results: Basic Metabolic Panel:  Recent Labs  16/10/96 0330 11/10/16 1030  NA 141 141  K 4.9 3.8  CL 98* 91*  CO2 34* 44*  GLUCOSE 108* 134*  BUN 44* 41*  CREATININE 1.24 1.26*    CBC:  Recent Labs  11/08/16 0327 11/09/16 0330  WBC 6.4 5.8  NEUTROABS 4.2 4.6  HGB 14.8 15.6  HCT 51.6 51.2  MCV 85.9 83.5  PLT 132* 135*    BNP    Component Value Date/Time   BNP 1,019.5 (H) 11/05/2016 1414   BNP CANCELED 10/26/2016 1139   Telemetry: Sinus rhythm  Assessment/Plan:   1.  Cor pulmonale with evidence of right heart pressure elevation with septal flattening on the echo.   I suspect that this is his major problem and its unclear whether the etiology of this is primary pulmonary hypertension, due to sarcoidosis, or  due to obstructive sleep apnea and obesity hypoventilation syndrome. Diuresing now with significant weight loss.  2.  Nephrotic syndrome under treatment by the nephrologists 3.  Morbid obesity 4.  Ongoing tobacco abuse  Recommendations:  Spoke to Dr. Shirlee Latch.  Plans are for right heart cath tomorrow.  Continue diuresis.  I'm going to transfer cardiology care over to the advanced heart failure team.  W. Ashley Royalty  MD [facility]  11/10/2016, 12:10 PM

## 2016-11-10 NOTE — Progress Notes (Signed)
Patient refused BIPAP for HS.  

## 2016-11-10 NOTE — Telephone Encounter (Signed)
Have been unable to contact patient. I did leave a message with appointment information

## 2016-11-10 NOTE — Progress Notes (Signed)
PROGRESS NOTE    Hunter [name]  ZOX:[id number] DOB: 08-Jan-1971 DOA: 11/05/2016 PCP: Bing Neighbors, FNP   Brief Narrative:  46 y.o. BM PMHx Sarcoidosis, Chronic Respiratory Failure with Hypoxia (on home O2 noncompliant), HTN, FSGS, CKD stage III. Tobacco abuse, OSA, Obesity Hypoventilation syndrome,  He is a very poor historian. He states that he has been following with nephrology and was on Lasix twice a day. This was recently changed in the past few days by his new primary care physician. He states that since his medications have been changed, he noted decrease in urine output, worsening shortness of breath as well as increased swelling of his abdomen and lower extremities. He is very upset his primary care physician on why his medications have been changed. On chart review of his office records on 9/25, it appears that patient had already been complaining of abdominal bloating for a week at that time despite taking furosemide. His primary care physician had changed his medication from furosemide to torsemide. He is unsure what his kidney diseases from, unsure if he has ever been diagnosed with heart failure. He is supposed to be on home oxygen at baseline, but due to being at a homeless shelter, has not been using it in the past month. Labs were obtained which revealed BNP 1019. Chest x-ray revealed findings consistent with congestive heart failure. On morning of 9/29, he was found to be somnolent and was placed on BiPAP. He has been on lasix gtt with diuresis. He has been evaluated by PCCM, cardiology, nephrology. Cardiology has recommended transfer to [facility] for advanced heart failure team consultation.    Subjective: 10/3 A/O 4, negative CP, positive SOB, negative abdominal pain, negative N/V. Noncompliant with home O2. States not on CPAP for his OSA/OHS. Base weight~277 pounds (125.6 kg). Does not weigh himself daily. States Cardiologist Dr. Donnie Aho, Nephrologist Dr. Kathrene Bongo. States  lives in a homeless shelter    Assessment & Plan:   Active Problems:   CHF (congestive heart failure) (HCC)   Acute hypercapnic respiratory failure (HCC)   Pulmonary edema   Restrictive lung disease secondary to obesity   Obesity hypoventilation syndrome (HCC)   OSA (obstructive sleep apnea)   Acute on chronic respiratory failure with hypoxia and hypercapnia (HCC)   Pressure injury of skin  Acute RIGHT HEART failure/Chronic Diastolic CHF -Multifactorial noncompliance with medication, noncompliance with nutrition, sarcoidosis, OSA/OHS untreated. -Scheduled for right heart cath on 10/4 -Strict in and out -Daily weight -Lasix drip -Hydralazine PRN -Zaroxolyn  Acute on Chronic Respiratory Failure with Hypoxia and Hypercapnia -Noncompliant with home O2. Per patient on 2-3 L O2 via [place] -Titrate O2 to maintain SPO2> 93% -BIPAP QHS and PRN -Will require sleep study as outpatient  Acute on chronic hypoxemic and hypercapnic respiratory failure -Has been inconsistently using his home oxygen due to residing at a homeless shelter currently -States never told informed he required BiPAP. Counseled patient at length on need for BiPAP use.  Loculated RIGHT Pleural Effusion -By [facility] 10/1 appears to be partially loculated right pleural effusion. Repeat PCXR on 10/4. Effusion is the same or worsened will need to consider IR for drainage. Do not believe patient will be candidate for VATS.   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  -Trending creatinine    Recent Labs Lab 11/05/16 1414 11/06/16 0324 11/07/16 0344 11/08/16 0327  11/09/16 0330 11/10/16 1030  CREATININE 1.52* 1.62* 1.50* 1.44* 1.24 1.26*  -Improving but not back to baseline -Nephrology following  Pre-Diabetes -Per kidney biopsy  early diabetic glomerulopathy  -10/3 Hemoglobin A1c= 6.3. Patient meets criteria for prediabetes -Lipid Panel pending   -Diabetic Nutrition consult. -Heart healthy/carb modified diet, 2000-calorie per day  HLD -Continue lipitor    Sarcoidosis of lung -Has followed with pulmonology in the past, now off prednisone   Morbid obesity -Body mass index is 41.09 kg/m.    Homelessness -Consult social work   OSA/OHS -Although no official sleep study patient clearly has signs and symptoms consistent with OSA/OHS to include hypercarbia  -Will need outpatient sleep study: Study schedule?  -BiPAP QHS while in hospital due to risk of hypercarbia . -Consult NCM will require Trilogy for home use until sleep study can be obtained   Left ankle gout -Uric acid 11.9. Will give prednisone as patient has AKI     DVT prophylaxis: Subcutaneous heparin Code Status: Full Family Communication: None Disposition Plan: TBD   Consultants:  Nephrology Cardiology [facility]    Procedures/Significant Events:  9/29 Echocardiogram: LVEF = 55%-60%. -- Ventricular septum: The contour showed systolic flattening. Changes are consistent with RV pressure overload. - Aortic root: The aortic root was mildly dilated. - Right ventricle: The cavity size was dilated. Systolic function was moderately reduced. - Right atrium: severely dilated. - Atrial septum: The septum bowed from right to left, consistent with increased right atrial pressure. - Pulmonary arteries: Systolic pressure could not be accurately   estimated todue to lack of an adequate TR jet. the dilated RV,   septtal flattening and bowing fo the atrial septum to the right   suggests signififcant elevation of RVSP and RAP. PA peak   pressure: 32 mm Hg (S).   I have personally reviewed and interpreted all radiology studies and my findings are as above.  VENTILATOR  SETTINGS: BiPAP   Cultures None  Antimicrobials: None   Devices None   LINES / TUBES:  None    Continuous Infusions: . furosemide (LASIX) infusion 10 mg/hr (11/09/16 2030)     Objective: Vitals:   11/09/16 2000 11/09/16 2051 11/10/16 0000 11/10/16 0500  BP: (!) 166/100 (!) 166/100 (!) 142/81 (!) 143/82  Pulse: 86 90  91  Resp: Temp: 98.8 F (37.1 C)  98 F (36.7 C) 98.1 F (36.7 C)  TempSrc: Oral   Oral  SpO2: 94% 93% 97% 90%  Weight:   285 lb 4.8 oz (129.4 kg)   Height:   6' (1.829 m)     Intake/Output Summary (Last 24 hours) at 11/10/16 0830 Last data filed at 11/10/16 0600  Gross per 24 hour  Intake             1530 ml  Output             6950 ml  Net            -5420 ml   Filed Weights   11/06/16 0352 11/09/16 0500 11/10/16 0000  Weight: (!) 302 lb 4 oz (137.1 kg) 290 lb 5.5 oz (131.7 kg) 285 lb 4.8 oz (129.4 kg)    Examination:  General: A/O 4, positive acute on chronic respiratory distress Eyes: negative scleral hemorrhage, negative anisocoria, negative icterus Neck:  Negative scars, masses, torticollis, lymphadenopathy, positive JVD Lungs: diffuse poor air movement, negative breath sounds RLL, negative wheezes or crackles  Cardiovascular: Tachycardic, Regular  rhythm without murmur  gallop or rub normal S1 and S2 Abdomen: MORBIDLY OBESE, negative abdominal pain, nondistended, positive soft, bowel sounds, no rebound, no ascites, no appreciable mass Extremities: No significant cyanosis, clubbing, or edema bilateral lower extremities Psychiatric:  Negative depression, negative anxiety, negative fatigue, negative mania, EXTREMELY POOR understanding of multiple medical problems. Central nervous system:  Cranial nerves II through XII intact, tongue/uvula midline, all extremities muscle strength 5/5, sensation intact throughout,  negative dysarthria, negative expressive aphasia, negative receptive aphasia.  .     Data Reviewed: Care  during the described time interval was provided by me .  I have reviewed this patient's available data, including medical history, events of note, physical examination, and all test results as part of my evaluation.   CBC:  Recent Labs Lab 11/06/16 0324 11/07/16 0344 11/08/16 0327 11/09/16 0330  WBC 6.1 4.9 6.4 5.8  NEUTROABS  --  3.2 4.2 4.6  HGB 14.4 14.2 14.8 15.6  HCT 51.1 50.3 51.6 51.2  MCV 89.3 87.0 85.9 83.5  PLT 133* 146* 132* 135*   Basic Metabolic Panel:  Recent Labs Lab 11/05/16 1414 11/05/16 1420 11/06/16 0324 11/07/16 0344 11/08/16 0327 11/09/16 0330  NA 143  --  143 143 142 141  K 5.7*  --  5.2* 4.6 4.5 4.9  CL 103  --  104 104 100* 98*  CO2 33*  --  33* 32 33* 34*  GLUCOSE 95  --  102* 78 71 108*  BUN 47*  --  45* 41* 42* 44*  CREATININE 1.52*  --  1.62* 1.50* 1.44* 1.24  CALCIUM 8.0*  --  7.7* 7.8* 8.1* 8.2*  MG  --  2.1  --  1.6* 1.6* 1.8   GFR: Estimated Creatinine Clearance: 101.3 mL/min (by C-G formula based on SCr of 1.24 mg/dL). Liver Function Tests:  Recent Labs Lab 11/05/16 1133  AST 37  ALT 35  ALKPHOS 125  BILITOT 0.9  PROT 6.5  ALBUMIN 3.0*   No results for input(s): LIPASE, AMYLASE in the last 168 hours. No results for input(s): AMMONIA in the last 168 hours. Coagulation Profile: No results for input(s): INR, PROTIME in the last 168 hours. Cardiac Enzymes: No results for input(s): CKTOTAL, CKMB, CKMBINDEX, TROPONINI in the last 168 hours. BNP (last 3 results) No results for input(s): PROBNP in the last 8760 hours. HbA1C: No results for input(s): HGBA1C in the last 72 hours. CBG: No results for input(s): GLUCAP in the last 168 hours. Lipid Profile: No results for input(s): CHOL, HDL, LDLCALC, TRIG, CHOLHDL, LDLDIRECT in the last 72 hours. Thyroid Function Tests: No results for input(s): TSH, T4TOTAL, FREET4, T3FREE, THYROIDAB in the last 72 hours. Anemia Panel: No results for input(s): VITAMINB12, FOLATE, FERRITIN, TIBC,  IRON, RETICCTPCT in the last 72 hours. Urine analysis:    Component Value Date/Time   LABSPEC 1.025 10/26/2016 1052   PHURINE 5.5 10/26/2016 1052   GLUCOSEU NEGATIVE 10/26/2016 1052   HGBUR TRACE (A) 10/26/2016 1052   BILIRUBINUR SMALL (A) 10/26/2016 1052   KETONESUR NEGATIVE 10/26/2016 1052   PROTEINUR >=300 (A) 10/26/2016 1052   UROBILINOGEN 1.0 10/26/2016 1052   NITRITE NEGATIVE 10/26/2016 1052   LEUKOCYTESUR NEGATIVE 10/26/2016 1052   Sepsis Labs: (procalcitonin:4,lacticidven:4)  ) Recent Results (from the past 240 hour(s))  MRSA PCR Screening     Status: None   Collection Time: 11/06/16  1:40 AM  Result Value Ref Range Status   MRSA by PCR NEGATIVE NEGATIVE Final    Comment:  The GeneXpert MRSA Assay (FDA approved for NASAL specimens only), is one component of a comprehensive MRSA colonization surveillance program. It is not intended to diagnose MRSA infection nor to guide or monitor treatment for MRSA infections.   MRSA PCR Screening     Status: None   Collection Time: 11/10/16  5:11 AM  Result Value Ref Range Status   MRSA by PCR NEGATIVE NEGATIVE Final    Comment:        The GeneXpert MRSA Assay (FDA approved for NASAL specimens only), is one component of a comprehensive MRSA colonization surveillance program. It is not intended to diagnose MRSA infection nor to guide or monitor treatment for MRSA infections.          Radiology Studies: Dg Chest 1 View  Result Date: 11/08/2016 CLINICAL DATA:  Hypercapnic respiratory failure. History of sarcoidosis. EXAM: CHEST 1 VIEW COMPARISON:  November 05, 2016 FINDINGS: There is a moderate partially loculated right pleural effusion. There is increase in interstitial and patchy alveolar edema with increased alveolar opacity primarily on the right. There is cardiomegaly with pulmonary venous hypertension. No adenopathy. No evident bone lesions. IMPRESSION: Evidence of congestive heart failure with  increase in edema and slight increase in right pleural effusion. Stable cardiac enlargement. Electronically Signed   By: Bretta Bang III M.D.   On: 11/08/2016 16:25        Scheduled Meds: . chlorhexidine  15 mL Mouth Rinse BID  . heparin  5,000 Units Subcutaneous Q8H  . mouth rinse  15 mL Mouth Rinse q12n4p  . metolazone  5 mg Oral BID  . predniSONE  50 mg Oral Q breakfast   Continuous Infusions: . furosemide (LASIX) infusion 10 mg/hr (11/09/16 2030)     LOS: 5 days    Time spent: 40 minutes    Kaian Fahs, Roselind Messier, MD [facility] Pager [phone]   If 7PM-7AM, please contact night-coverage [email or web address] Password [email or web address] 11/10/2016, 8:30 AM

## 2016-11-10 NOTE — Progress Notes (Signed)
Oxygen sats 69 - 76 % on RA. Nasal Cannula replaced at 2L. On continuous pulse ox sats = 80- 85%. Dr. Shirlee Latch into room.  [place] replaced with 100% NRB mask. Sats = 97 %. Will continue to monitor.

## 2016-11-10 NOTE — Progress Notes (Signed)
PT refused BIPAP. RT will continue to monitor

## 2016-11-10 NOTE — Progress Notes (Signed)
[facility] Progress Note  Subjective: 7 L UOP yesterday w/ addition of zaroxolyn.  Pt feeling "much better".      Vitals:   11/10/16 0918 11/10/16 0926 11/10/16 1201 11/10/16 1246  BP:      Pulse: 90 88 83 89  Resp:      Temp:      TempSrc:      SpO2: 94% 99% 94% 93%  Weight:      Height:        Inpatient medications: . [START ON 11/11/2016] aspirin  81 mg Oral Pre-Cath  . chlorhexidine  15 mL Mouth Rinse BID  . heparin  5,000 Units Subcutaneous Q8H  . mouth rinse  15 mL Mouth Rinse q12n4p  . metolazone  5 mg Oral BID  . sodium chloride flush  3 mL Intravenous Q12H   . sodium chloride    . [START ON 11/11/2016] sodium chloride    . furosemide (LASIX) infusion 10 mg/hr (11/09/16 2030)   sodium chloride, acetaminophen **OR** acetaminophen, hydrALAZINE, ondansetron **OR** ondansetron (ZOFRAN) IV, polyethylene glycol, sodium chloride flush  Exam: YQM:VHQIO AAM on bipap, responds w/ nods CVS:no rub Resp:decreased BS at bases NGE:XBMWU, +BS, soft, NT Ext:1+ pitting LE and flank edema  Home meds: -demadex 20 qd/ prinivil 20 qd/ lasix 40 qd -ibuprofen/ vicodin/ lipitor/ vistaril prn   Assessment: 1.  Acute / CKD - due to decomp R CHF.  Improved creat at 1.2 2.  CHF/ pulm edema/ vol overload - diuresing better, CHF team now following, pt on IV lasix gtt and po metolazone. Will defer to CHF team for diuretic management from here.  Happy to help if needed, please call.  3.  Pulm sarcoid - severe pulm HTN, for cath soon 4.  Social - homeless  Plan - as above    Hunter Moselle MD [employer] pgr [phone]   11/08/2016, 2:55 PM    Recent Labs Lab 11/08/16 0327 11/09/16 0330 11/10/16 1030  NA 142 141 141  K 4.5 4.9 3.8  CL 100* 98* 91*  CO2 33* 34* 44*  GLUCOSE 71 108* 134*  BUN 42* 44* 41*  CREATININE 1.44* 1.24 1.26*  CALCIUM 8.1* 8.2* 8.6*    Recent Labs Lab 11/05/16 1133  AST 37  ALT 35  ALKPHOS 125  BILITOT 0.9   PROT 6.5  ALBUMIN 3.0*    Recent Labs Lab 11/07/16 0344 11/08/16 0327 11/09/16 0330  WBC 4.9 6.4 5.8  NEUTROABS 3.2 4.2 4.6  HGB 14.2 14.8 15.6  HCT 50.3 51.6 51.2  MCV 87.0 85.9 83.5  PLT 146* 132* 135*   Iron/TIBC/Ferritin/ %Sat No results found for: IRON, TIBC, FERRITIN, IRONPCTSAT

## 2016-11-10 NOTE — Clinical Social Work Note (Signed)
Clinical Social Work Assessment  Patient Details  Name: Hunter [name] MRN: [id number] Date of Birth: [birth year]  Date of referral:  11/10/16               Reason for consult:  Housing Concerns/Homelessness                Permission sought to share information with:    Permission granted to share information::     Name::        Agency::     Relationship::     Contact Information:     Housing/Transportation Living arrangements for the past 2 months:  Barrister's clerk of Information:  Patient Patient Interpreter Needed:  None Criminal Activity/Legal Involvement Pertinent to Current Situation/Hospitalization:  No - Comment as needed Significant Relationships:  Siblings Lives with:  Self Do you feel safe going back to the place where you live?    Need for family participation in patient care:  No (Coment)  Care giving concerns: Patient stays at [facility]. CSW consulted for patient homelessness.   Social Worker assessment / plan: CSW met with patient at bedside and assessed housing needs. Patient reported he was evicted from his home about a month ago and since then has been staying at [facility]. Patient is employed and hoping to get back to work after discharge. CSW offered additional shelter resources, patient indicated he is at shelter now and has source of income. CSW signing off.  Employment status:  [employer] information:  Self Pay (Medicaid Pending) PT Recommendations:  Not assessed at this time Information / Referral to community resources:  Shelter  Patient/Family's Response to care: Not discussed.  Patient/Family's Understanding of and Emotional Response to Diagnosis, Current Treatment, and Prognosis: Patient with understanding of his medical condition and hopeful for discharge when medically stable so he can return to work.  Emotional Assessment Appearance:  Appears stated age Attitude/Demeanor/Rapport:  Other (appropriate) Affect  (typically observed):  Calm Orientation:  Oriented to Self, Oriented to Situation, Oriented to Place, Oriented to  Time Alcohol / Substance use:  Not Applicable Psych involvement (Current and /or in the community):  No (Comment)  Discharge Needs  Concerns to be addressed:  Homelessness Readmission within the last 30 days:  No Current discharge risk:  Homeless Barriers to Discharge:  Continued Medical Work up   Estanislado Emms, LCSW 11/10/2016, 10:53 AM

## 2016-11-10 NOTE — Consult Note (Signed)
Advanced Heart Failure Team Consult Note   Primary Cardiologist: Hunter [name]  Reason for Consultation:  RV failure  HPI:    Hunter [name] is seen today for evaluation of RV failure/pulmonary hypertension at the request of Dr. Donnie [name].   Patient has history of sarcoidosis, chronic hypoxemic respiratory failure, and focal segmental glomerulosclerosis with nephrotic syndrome.  He is homeless, which has significantly impacted his care.  FSGS has been treated in the past with steroids and Prograf, but treatment has been limited by compliance issues.  Not currently getting disease-specific treatment.  Last chest CT was in 2016 and was suggestive of stage IV pulmonary sarcoidosis.  PFTs in 1/18 showed severe restriction and severe obstruction.  He is on home oxygen 2 L [place] at baseline.  He was admitted with worsening peripheral edema and dyspnea.  He initially did not diurese well but is now on Lasix gtt + bid metolazone and diuresed very well yesterday with weight down 5 lbs.  Nephrology following because of nephrotic syndrome.  Echo showed normal LV EF with D-shaped interventricular septum and RV failure.  PA pressure estimation is not likely to be accurate. Creatinine down to 1.24 today.  When I saw him today, oxygen saturation was in the low 80s on nasal cannula and NRBM had to be placed.  He has been on and off Bipap since admission.   ROS:  All systems reviewed and negative except as per HPI.   Home Medications Prior to Admission medications   Medication Sig Start Date End Date Taking? Authorizing Provider  atorvastatin (LIPITOR) 20 MG tablet Take 1 tablet (20 mg total) by mouth daily. 10/26/16  Yes Bing Neighbors, FNP  hydrOXYzine (VISTARIL) 100 MG capsule Take 1 capsule (100 mg total) by mouth daily. 10/26/16  Yes Bing Neighbors, FNP  torsemide (DEMADEX) 20 MG tablet Take 1 tablet (20 mg total) by mouth daily. 11/02/16  Yes Bing Neighbors, FNP  furosemide (LASIX) 40 MG tablet Take 1  tablet (40 mg total) by mouth daily. Patient not taking: Reported on 11/05/2016 10/26/16   Bing Neighbors, FNP  lisinopril (PRINIVIL,ZESTRIL) 20 MG tablet Take 1 tablet (20 mg total) by mouth daily. Patient not taking: Reported on 11/05/2016 11/02/16   Bing Neighbors, FNP   Current Scheduled Meds: . chlorhexidine  15 mL Mouth Rinse BID  . heparin  5,000 Units Subcutaneous Q8H  . mouth rinse  15 mL Mouth Rinse q12n4p  . metolazone  5 mg Oral BID   Continuous Infusions: . furosemide (LASIX) infusion 10 mg/hr (11/09/16 2030)   PRN Meds:.acetaminophen **OR** acetaminophen, hydrALAZINE, ondansetron **OR** ondansetron (ZOFRAN) IV, polyethylene glycol  Past Medical History: 1. Sarcoidosis: Diagnosed by skin biopsy.  - PFTs (1/18): Severe obstruction and severe restriction.  - CT chest (2016): Consistent with stage IV pulmonary sarcoidosis.  2. Chronic hypoxemic respiratory failure: 2L home O2.  Likely due to pulmonary sarcoidosis, ? Role for OHS/OSA.  3. Chronic diastolic CHF/RV failure: Echo (9/18) with EF 55-60%, septal flattening, dilated RV with moderately decreased systolic function.  PA pressure estimation likely inaccurate.  4. HTN 5. Focal segmental glomerulosclerosis: With nephrotic syndrome.  Followed by Dr. Kathrene Bongo.  Has been treated with steroids and Prograf in the past.   Past Surgical History: History reviewed. No pertinent surgical history.  Family History: He does not report sarcoidosis or CHF/coronary disease.   Social History: Social History   Social History  . Marital status: Single    Spouse name: N/A  .  Number of children: N/A  . Years of education: N/A   Social History Main Topics  . Smoking status: Current Some Day Smoker    Packs/day: 0.50    Years: 10.00    Types: Cigarettes  . Smokeless tobacco: Never Used  . Alcohol use 1.8 oz/week    3 Cans of beer per week  . Drug use: Unknown  . Sexual activity: Not Asked   Other Topics Concern  .  None   Social History Narrative  . None    Allergies:  Allergies  Allergen Reactions  . Thalidomide Other (See Comments)    Derm,resp    Objective:    Vital Signs:   Temp:  [98 F (36.7 C)-99 F (37.2 C)] 98.1 F (36.7 C) (10/03 0500) Pulse Rate:  [79-95] 91 (10/03 0500) Resp:  [16-27] 18 (10/03 0500) BP: (137-173)/(80-103) 143/82 (10/03 0500) SpO2:  [87 %-97 %] 90 % (10/03 0500) Weight:  [285 lb 4.8 oz (129.4 kg)] 285 lb 4.8 oz (129.4 kg) (10/03 0000) Last BM Date:  (PTA)  Weight change: Filed Weights   11/06/16 0352 11/09/16 0500 11/10/16 0000  Weight: (!) 302 lb 4 oz (137.1 kg) 290 lb 5.5 oz (131.7 kg) 285 lb 4.8 oz (129.4 kg)    Intake/Output:   Intake/Output Summary (Last 24 hours) at 11/10/16 0922 Last data filed at 11/10/16 0600  Gross per 24 hour  Intake             1530 ml  Output             6950 ml  Net            -5420 ml      Physical Exam    General:  Tachypneic HEENT: normal Neck: supple. JVP 14+ cm. Carotids 2+ bilat; no bruits. No lymphadenopathy or thyromegaly appreciated. Cor: PMI nondisplaced. Regular rate & rhythm. No rubs, gallops or murmurs. Lungs: Mild crackles at bases bilaterally.  Abdomen: soft, nontender, nondistended. No hepatosplenomegaly. No bruits or masses. Good bowel sounds. Extremities: no cyanosis, clubbing, rash.  1+ ankle edema.  Neuro: alert & orientedx3, cranial nerves grossly intact. moves all 4 extremities w/o difficulty. Affect pleasant   Telemetry   NSR (personally reviewed)  EKG    NSR, right axis deviation, 1st degree AVB, incomplete RBBB (personally reviewed)  Labs   Basic Metabolic Panel:  Recent Labs Lab 11/05/16 1414 11/05/16 1420 11/06/16 0324 11/07/16 0344 11/08/16 0327 11/09/16 0330  NA 143  --  143 143 142 141  K 5.7*  --  5.2* 4.6 4.5 4.9  CL 103  --  104 104 100* 98*  CO2 33*  --  33* 32 33* 34*  GLUCOSE 95  --  102* 78 71 108*  BUN 47*  --  45* 41* 42* 44*  CREATININE 1.52*  --   1.62* 1.50* 1.44* 1.24  CALCIUM 8.0*  --  7.7* 7.8* 8.1* 8.2*  MG  --  2.1  --  1.6* 1.6* 1.8    Liver Function Tests:  Recent Labs Lab 11/05/16 1133  AST 37  ALT 35  ALKPHOS 125  BILITOT 0.9  PROT 6.5  ALBUMIN 3.0*   No results for input(s): LIPASE, AMYLASE in the last 168 hours. No results for input(s): AMMONIA in the last 168 hours.  CBC:  Recent Labs Lab 11/06/16 0324 11/07/16 0344 11/08/16 0327 11/09/16 0330  WBC 6.1 4.9 6.4 5.8  NEUTROABS  --  3.2 4.2 4.6  HGB 14.4 14.2  14.8 15.6  HCT 51.1 50.3 51.6 51.2  MCV 89.3 87.0 85.9 83.5  PLT 133* 146* 132* 135*    Cardiac Enzymes: No results for input(s): CKTOTAL, CKMB, CKMBINDEX, TROPONINI in the last 168 hours.  BNP: BNP (last 3 results)  Recent Labs  11/02/16 0841 11/05/16 1133 11/05/16 1414  BNP 729.6* 1,034.7* 1,019.5*    ProBNP (last 3 results) No results for input(s): PROBNP in the last 8760 hours.   CBG: No results for input(s): GLUCAP in the last 168 hours.  Coagulation Studies: No results for input(s): LABPROT, INR in the last 72 hours.   Imaging    No results found.   Medications:     Current Medications: . chlorhexidine  15 mL Mouth Rinse BID  . heparin  5,000 Units Subcutaneous Q8H  . mouth rinse  15 mL Mouth Rinse q12n4p  . metolazone  5 mg Oral BID     Infusions: . furosemide (LASIX) infusion 10 mg/hr (11/09/16 2030)       Patient Profile   Patient has history of sarcoidosis, chronic hypoxemic respiratory failure, and focal segmental glomerulosclerosis with nephrotic syndrome.  He was admitted with acute on chronic respiratory failure and volume overload.    Assessment/Plan   1. Acute on chronic hypoxemic/hypercarbic respiratory failure:  He is on home oxygen at baseline.  Looking at CT from 2016, he appears to have significant pulmonary sarcoidosis.  It is possible that this is the primary respiratory problem.  However, also some concern for OSH/OSA (never had  sleep study).  Pulmonary edema this admission may contribute.  - Needs high resolution CT chest to evaluate sarcoidosis after full diuresis.  - Continue NRBM/Bipap as needed for now to keep oxygen saturation in the 88-92% range.  2. 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  He is volume overloaded on exam.  Suspect significant pulmonary hypertension though unable to get accurate PA pressure estimation from echo.  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  Treatment with selective pulmonary vasodilators does not have great evidence in this population.  There may be some benefit with PAH and more limited sarcoidosis parenchymal disease, but with extensive parenchymal disease benefit is probably marginal at best.  He is now diuresing well with current regimen.  - As above, need eventual high resolution CT chest for evaluation of pulmonary sarcoidosis extent.  - Would continue Lasix gtt at 10 mg/hr + metolazone 5 mg bid.   - Will plan on RHC tomorrow morning after another day of diuresis to assess hemodynamics.  Risks/benefits discussed with patient and he agrees to proceed.  Can consider treatment of PAH if lung parenchymal disease is not markedly extensive.  Also need to keep oxygen saturation up to avoid hypoxic pulmonary vasoconstriction.  3. Focal segmental glomerulosclerosis with nephrotic syndrome: As above, patient now diuresing well. Creatinine improving with diuresis, likely due to decreased renal venous pressure and decompression of RV.   - Continue current diuretic regimen today.  - Renal following.    Length of Stay: 5  Marca Ancona, MD  11/10/2016, 9:22 AM  Advanced Heart Failure Team Pager [phone] (M-F; 7a - 4p)  Please contact [facility] for night-coverage after hours (4p -7a ) and weekends on [email or web address]

## 2016-11-11 ENCOUNTER — Encounter (HOSPITAL_COMMUNITY): Payer: Self-pay | Admitting: Cardiology

## 2016-11-11 ENCOUNTER — Inpatient Hospital Stay (HOSPITAL_COMMUNITY): Payer: Medicaid Other

## 2016-11-11 ENCOUNTER — Encounter (HOSPITAL_COMMUNITY)
Admission: EM | Disposition: A | Discharge status: Home Health | Payer: Self-pay | Source: Home / Self Care | Attending: Internal Medicine

## 2016-11-11 ENCOUNTER — Ambulatory Visit (HOSPITAL_COMMUNITY): Admit: 2016-11-11 | Payer: Self-pay | Admitting: Cardiology

## 2016-11-11 ENCOUNTER — Institutional Professional Consult (permissible substitution): Payer: Self-pay | Admitting: Internal Medicine

## 2016-11-11 HISTORY — PX: RIGHT HEART CATH: CATH118263

## 2016-11-11 LAB — CBC
HEMATOCRIT: 55.8 % — AB (ref 39.0–52.0)
HEMOGLOBIN: 16.1 g/dL (ref 13.0–17.0)
MCH: 25.1 pg — AB (ref 26.0–34.0)
MCHC: 28.9 g/dL — ABNORMAL LOW (ref 30.0–36.0)
MCV: 86.9 fL (ref 78.0–100.0)
PLATELETS: 127 10*3/uL — AB (ref 150–400)
RBC: 6.42 MIL/uL — AB (ref 4.22–5.81)
RDW: 18.4 % — ABNORMAL HIGH (ref 11.5–15.5)
WBC: 4.4 10*3/uL (ref 4.0–10.5)

## 2016-11-11 LAB — CBC WITH DIFFERENTIAL/PLATELET
Basophils Absolute: 0 10*3/uL (ref 0.0–0.1)
Basophils Relative: 1 %
EOS ABS: 0.1 10*3/uL (ref 0.0–0.7)
Eosinophils Relative: 2 %
HCT: 53.5 % — ABNORMAL HIGH (ref 39.0–52.0)
HEMOGLOBIN: 15.6 g/dL (ref 13.0–17.0)
LYMPHS ABS: 1.2 10*3/uL (ref 0.7–4.0)
Lymphocytes Relative: 24 %
MCH: 24.8 pg — AB (ref 26.0–34.0)
MCHC: 29.2 g/dL — AB (ref 30.0–36.0)
MCV: 84.9 fL (ref 78.0–100.0)
MONOS PCT: 18 %
Monocytes Absolute: 0.9 10*3/uL (ref 0.1–1.0)
NEUTROS PCT: 55 %
Neutro Abs: 2.6 10*3/uL (ref 1.7–7.7)
Platelets: 120 10*3/uL — ABNORMAL LOW (ref 150–400)
RBC: 6.3 MIL/uL — ABNORMAL HIGH (ref 4.22–5.81)
RDW: 18.2 % — ABNORMAL HIGH (ref 11.5–15.5)
WBC: 4.8 10*3/uL (ref 4.0–10.5)

## 2016-11-11 LAB — BASIC METABOLIC PANEL
Anion gap: 14 (ref 5–15)
BUN: 37 mg/dL — AB (ref 6–20)
CHLORIDE: 86 mmol/L — AB (ref 101–111)
CO2: 43 mmol/L — AB (ref 22–32)
CREATININE: 1.11 mg/dL (ref 0.61–1.24)
Calcium: 8.7 mg/dL — ABNORMAL LOW (ref 8.9–10.3)
GFR calc Af Amer: >60 mL/min (ref >60)
GFR calc non Af Amer: >60 mL/min (ref >60)
GLUCOSE: 70 mg/dL (ref 65–99)
Potassium: 3.9 mmol/L (ref 3.5–5.1)
SODIUM: 143 mmol/L (ref 135–145)

## 2016-11-11 LAB — POCT I-STAT 3, VENOUS BLOOD GAS (G3P V)
Acid-Base Excess: 26 mmol/L — ABNORMAL HIGH (ref 0.0–2.0)
Acid-Base Excess: 27 mmol/L — ABNORMAL HIGH (ref 0.0–2.0)
BICARBONATE: 59.9 mmol/L — AB (ref 20.0–28.0)
Bicarbonate: 58.6 mmol/L — ABNORMAL HIGH (ref 20.0–28.0)
O2 Saturation: 65 %
O2 Saturation: 66 %
TCO2: >50 mmol/L — ABNORMAL HIGH (ref 22–32)
pCO2, Ven: 85.8 mmHg (ref 44.0–60.0)
pCO2, Ven: 87.9 mmHg (ref 44.0–60.0)
pH, Ven: 7.441 — ABNORMAL HIGH (ref 7.250–7.430)
pH, Ven: 7.442 — ABNORMAL HIGH (ref 7.250–7.430)
pO2, Ven: 36 mmHg (ref 32.0–45.0)
pO2, Ven: 36 mmHg (ref 32.0–45.0)

## 2016-11-11 LAB — PROTIME-INR
INR: 0.89
Prothrombin Time: 11.9 seconds (ref 11.4–15.2)

## 2016-11-11 LAB — MAGNESIUM: Magnesium: 1.3 mg/dL — ABNORMAL LOW (ref 1.7–2.4)

## 2016-11-11 LAB — CREATININE, SERUM
Creatinine, Ser: 1.09 mg/dL (ref 0.61–1.24)
GFR calc non Af Amer: >60 mL/min (ref >60)

## 2016-11-11 SURGERY — RIGHT HEART CATH
Anesthesia: LOCAL

## 2016-11-11 MED ORDER — ATORVASTATIN CALCIUM 40 MG PO TABS
40.0000 mg | ORAL_TABLET | Freq: Every day | ORAL | Status: DC
  Administered 2016-11-12 – 2016-11-17 (×7): 40 mg via ORAL
  Filled 2016-11-11 (×7): qty 1

## 2016-11-11 MED ORDER — HYDROCODONE-ACETAMINOPHEN 5-325 MG PO TABS
2.0000 | ORAL_TABLET | Freq: Once | ORAL | Status: AC
  Administered 2016-11-11: 2 via ORAL
  Filled 2016-11-11: qty 2

## 2016-11-11 MED ORDER — SODIUM CHLORIDE 0.9 % IV SOLN
250.0000 mL | INTRAVENOUS | Status: DC | PRN
Start: 2016-11-11 — End: 2016-11-15

## 2016-11-11 MED ORDER — SALINE SPRAY 0.65 % NA SOLN
1.0000 | NASAL | Status: DC | PRN
  Filled 2016-11-11: qty 44

## 2016-11-11 MED ORDER — HEPARIN (PORCINE) IN NACL 2-0.9 UNIT/ML-% IJ SOLN
INTRAMUSCULAR | Status: AC | PRN
  Administered 2016-11-11: 500 mL

## 2016-11-11 MED ORDER — ONDANSETRON HCL 4 MG/2ML IJ SOLN: 4.0000 mg | Freq: Four times a day (QID) | INTRAMUSCULAR | Status: DC | PRN

## 2016-11-11 MED ORDER — HEPARIN SODIUM (PORCINE) 5000 UNIT/ML IJ SOLN: 5000.0000 [IU] | Freq: Three times a day (TID) | INTRAMUSCULAR | Status: DC

## 2016-11-11 MED ORDER — ACETAMINOPHEN 325 MG PO TABS: 650.0000 mg | ORAL_TABLET | ORAL | Status: DC | PRN

## 2016-11-11 MED ORDER — MAGNESIUM SULFATE 4 GM/100ML IV SOLN
4.0000 g | Freq: Once | INTRAVENOUS | Status: AC
  Administered 2016-11-11: 4 g via INTRAVENOUS
  Filled 2016-11-11: qty 100

## 2016-11-11 MED ORDER — SODIUM CHLORIDE 0.9% FLUSH
3.0000 mL | Freq: Two times a day (BID) | INTRAVENOUS | Status: DC
  Administered 2016-11-11 – 2016-11-15 (×9): 3 mL via INTRAVENOUS

## 2016-11-11 MED ORDER — TORSEMIDE 20 MG PO TABS
40.0000 mg | ORAL_TABLET | Freq: Two times a day (BID) | ORAL | Status: DC
  Administered 2016-11-11 – 2016-11-14 (×6): 40 mg via ORAL
  Filled 2016-11-11 (×6): qty 2

## 2016-11-11 MED ORDER — HYDROCODONE-ACETAMINOPHEN 5-325 MG PO TABS
1.0000 | ORAL_TABLET | ORAL | Status: DC | PRN
  Administered 2016-11-12 – 2016-11-14 (×5): 1 via ORAL
  Filled 2016-11-11 (×5): qty 1

## 2016-11-11 MED ORDER — LIDOCAINE HCL (PF) 1 % IJ SOLN
INTRAMUSCULAR | Status: DC | PRN
  Administered 2016-11-11: 2 mL via SUBCUTANEOUS

## 2016-11-11 MED ORDER — HEPARIN (PORCINE) IN NACL 2-0.9 UNIT/ML-% IJ SOLN
INTRAMUSCULAR | Status: AC
  Filled 2016-11-11: qty 500

## 2016-11-11 MED ORDER — SODIUM CHLORIDE 0.9% FLUSH
3.0000 mL | INTRAVENOUS | Status: DC | PRN
Start: 2016-11-11 — End: 2016-11-15

## 2016-11-11 SURGICAL SUPPLY — 5 items
CATH BALLN WEDGE 5F 110CM (CATHETERS) ×1 IMPLANT
HOVERMATT SINGLE USE (MISCELLANEOUS) ×1 IMPLANT
PACK CARDIAC CATHETERIZATION (CUSTOM PROCEDURE TRAY) ×2 IMPLANT
SHEATH GLIDE SLENDER 4/5FR (SHEATH) ×1 IMPLANT
TRANSDUCER W/STOPCOCK (MISCELLANEOUS) ×2 IMPLANT

## 2016-11-11 NOTE — Interval H&P Note (Signed)
History and Physical Interval Note:  11/11/2016 12:59 PM  Hunter [name]  has presented today for surgery, with the diagnosis of chf  The various methods of treatment have been discussed with the patient and family. After consideration of risks, benefits and other options for treatment, the patient has consented to  Procedure(s): RIGHT HEART CATH (N/A) as a surgical intervention .  The patient's history has been reviewed, patient examined, no change in status, stable for surgery.  I have reviewed the patient's chart and labs.  Questions were answered to the patient's satisfaction.     [name] [employer]

## 2016-11-11 NOTE — Progress Notes (Signed)
PROGRESS NOTE    Hunter [name]  WUJ:[id number] DOB: [birth year] DOA: 11/05/2016 PCP: Bing Neighbors, FNP   Brief Narrative:  46 y.o. BM PMHx Sarcoidosis, Chronic Respiratory Failure with Hypoxia (on home O2 noncompliant), HTN, FSGS, CKD stage III. Tobacco abuse, OSA, Obesity Hypoventilation syndrome,  He is a very poor historian. He states that he has been following with nephrology and was on Lasix twice a day. This was recently changed in the past few days by his new primary care physician. He states that since his medications have been changed, he noted decrease in urine output, worsening shortness of breath as well as increased swelling of his abdomen and lower extremities. He is very upset his primary care physician on why his medications have been changed. On chart review of his office records on 9/25, it appears that patient had already been complaining of abdominal bloating for a week at that time despite taking furosemide. His primary care physician had changed his medication from furosemide to torsemide. He is unsure what his kidney diseases from, unsure if he has ever been diagnosed with heart failure. He is supposed to be on home oxygen at baseline, but due to being at a homeless shelter, has not been using it in the past month. Labs were obtained which revealed BNP 1019. Chest x-ray revealed findings consistent with congestive heart failure. On morning of 9/29, he was found to be somnolent and was placed on BiPAP. He has been on lasix gtt with diuresis. He has been evaluated by PCCM, cardiology, nephrology. Cardiology has recommended transfer to [facility] for advanced heart failure team consultation.    Subjective: 10/4 A/O 4, negative CP, negative SOB, negative abdominal pain, negative N/V. Noncompliant with home O2. Noncompliant with CPAP for his OSA/OHS Base weight~277 pounds (125.6 kg). Does not weigh himself daily. States Cardiologist Dr. Donnie Aho, Nephrologist Dr. Kathrene Bongo.  States lives in a homeless shelter   Assessment & Plan:   Active Problems:   CHF (congestive heart failure) (HCC)   Acute hypercapnic respiratory failure (HCC)   Pulmonary edema   Restrictive lung disease secondary to obesity   Obesity hypoventilation syndrome (HCC)   OSA (obstructive sleep apnea)   Acute on chronic respiratory failure with hypoxia and hypercapnia (HCC)   Pressure injury of skin   RVF (right ventricular failure) (HCC)  Acute RIGHT HEART failure/Chronic Diastolic CHF -Multifactorial noncompliance with medication, noncompliance with nutrition, sarcoidosis, OSA/OHS untreated. -10/4 S/P RIGHT HEART catheterization: See results below. Cardiology to arrange for HRCT chest -Strict in and out since admission -24.5 L -Daily weight Filed Weights   11/09/16 0500 11/10/16 0000 11/11/16 0611  Weight: 290 lb 5.5 oz (131.7 kg) 285 lb 4.8 oz (129.4 kg) 264 lb (119.7 kg)  -hydralazine PRN -Torsemide 40 mg BID  Acute on Chronic Respiratory Failure with Hypoxia and Hypercapnia -Noncompliant with home O2. Per patient on 2-3 L O2 via [place] -Titrate O2 to maintain SPO2> 93% -BiPAP QHS and PRN -Will require sleep study as outpatient -Secondary to patient's extremely poor understanding of his role in his worsening disease process, most likely will not follow up for sleep study nor would he be compliant with BiPAP. -Has not been consistently using his home oxygen due to residing at a homeless shelter currently -States never told informed he required BiPAP. AGAIN counseled patient at length on need for BiPAP use. Patient refuses to understand severity of his disease process.  Loculated RIGHT Pleural Effusion -By [facility] 10/1 appears to be partially loculated right pleural  effusion. Repeat PCXR on 10/4. Effusion is the same or worsened will need to consider IR for drainage. Do not believe patient will be candidate for VATS. -Repeat PCXR 10/4: Right pleural effusion, patchy opacification  multifocal pneumonia?. Do not believe patient has active infection, negative fever, negative leukocytosis, would not start antibiotics at this time.   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  -Trending creatinine    Recent Labs Lab 11/07/16 0344 11/08/16 0327 11/09/16 0330 11/10/16 1030 11/11/16 0503 11/11/16 1408  CREATININE 1.50* 1.44* 1.24 1.26* 1.11 1.09  -Significantly improved with diuresis -Nephrology following  Pre-Diabetes -Per kidney biopsy early diabetic glomerulopathy  -10/3 Hemoglobin A1c= 6.3. Patient meets criteria for prediabetes. -Per Diabetic Nutritionist note patient uninterested in diabetic education. Again emphasized to patient his need for total lifestyle change in order to maximize his survivability. -Lipid Panel pending   -Diabetic Nutrition consult. -Heart healthy/carb modified diet, 2000-calorie per day  HLD -Lipid panel not within ADA guidelines -10/4 start Lipitor 40 mg daily     Sarcoidosis of lung -Has followed with pulmonology in the past, now off prednisone -Cardiology arrange for HRCT chest   Morbid obesity -Body mass index is 41.09 kg/m.    Homelessness -Reconsult Social Work; patient would like to apply for disability, discuss payment plan options for hospitalization.   OSA/OHS -Although no official sleep study patient clearly has signs and symptoms consistent with OSA/OHS to include hypercarbia  -Will need outpatient sleep study: Study schedule?  -BiPAP QHS while in hospital due to risk of hypercarbia . -Consult NCM will require Trilogy for home use until sleep study can be obtained. NOTE do not believe patient will be compliant given his current attitude toward his health issues.   Left ankle gout -Uric acid 11.9. Will give prednisone as  patient has AKI   Hypomagnesemia -Magnesium goal> 2 -Magnesium IV2 g    DVT prophylaxis: Subcutaneous heparin Code Status: Full Family Communication: None Disposition Plan: TBD   Consultants:  Nephrology Cardiology [facility]    Procedures/Significant Events:  9/29 Echocardiogram: LVEF = 55%-60%. -- Ventricular septum: The contour showed systolic flattening. Changes are consistent with RV pressure overload. - Right atrium: severely dilated. - Atrial septum: The septum bowed from right to left, consistent with increased right atrial pressure. - Pulmonary arteries: Systolic pressure could not be accurately   estimated todue to lack of an adequate TR jet. the dilated RV,   septtal flattening and bowing fo the atrial septum to the right   suggests signififcant elevation of RVSP and RAP. PA peak   pressure: 32 mm Hg (S). 10/4 RIGHT cardiac heart cath:Optimized filling pressures.   2. 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  With PVR not particularly high (3.6 WU), think that he probably would get much benefit from pulmonary vasodilator.    I have personally reviewed and interpreted all radiology studies and my findings are as above.  VENTILATOR SETTINGS: BiPAP   Cultures None  Antimicrobials: None   Devices None   LINES / TUBES:  None    Continuous Infusions: . sodium chloride       Objective: Vitals:   11/11/16 1315 11/11/16 1320 11/11/16 1325 11/11/16 1459  BP: (!) 133/93 (!) 132/96    Pulse: 86 87 (!) 50   Resp: Temp:    98 F (  36.7 C)  TempSrc:    Oral  SpO2: 92% 94% (!) 0%   Weight:      Height:        Intake/Output Summary (Last 24 hours) at 11/11/16 1659 Last data filed at 11/11/16 1431  Gross per 24 hour  Intake             1010 ml  Output            10500 ml  Net            -9490 ml   Filed Weights   11/09/16 0500 11/10/16 0000 11/11/16 1610  Weight: 290 lb 5.5 oz (131.7 kg) 285 lb 4.8 oz (129.4 kg)  264 lb (119.7 kg)    General: A/O 4, positive acute on chronic respiratory distress Eyes: negative scleral hemorrhage, negative anisocoria, negative icterus Neck:  Negative scars, masses, torticollis, lymphadenopathy, positive JVD Lungs: diffuse poor air movement, negative breath sounds RLL, negative wheezes or crackles  Cardiovascular: Tachycardic, Regular  rhythm without murmur gallop or rub normal S1 and S2 Abdomen: MORBIDLY OBESE, negative abdominal pain, nondistended, positive soft, bowel sounds, no rebound, no ascites, no appreciable mass Extremities: No significant cyanosis, clubbing, or edema bilateral lower extremities Psychiatric:  Negative depression, negative anxiety, negative fatigue, negative mania, EXTREMELY POOR understanding of multiple medical problems. Central nervous system:  Cranial nerves II through XII intact, tongue/uvula midline, all extremities muscle strength 5/5, sensation intact throughout,  negative dysarthria, negative expressive aphasia, negative receptive aphasia.  .     Data Reviewed: Care during the described time interval was provided by me .  I have reviewed this patient's available data, including medical history, events of note, physical examination, and all test results as part of my evaluation.   CBC:  Recent Labs Lab 11/07/16 0344 11/08/16 0327 11/09/16 0330 11/11/16 0503 11/11/16 1408  WBC 4.9 6.4 5.8 4.8 4.4  NEUTROABS 3.2 4.2 4.6 2.6  --   HGB 14.2 14.8 15.6 15.6 16.1  HCT 50.3 51.6 51.2 53.5* 55.8*  MCV 87.0 85.9 83.5 84.9 86.9  PLT 146* 132* 135* 120* 127*   Basic Metabolic Panel:  Recent Labs Lab 11/07/16 0344 11/08/16 0327 11/09/16 0330 11/10/16 1030 11/11/16 0503 11/11/16 1408  NA 143 142 141 141 143  --   K 4.6 4.5 4.9 3.8 3.9  --   CL 104 100* 98* 91* 86*  --   CO2 32 33* 34* 44* 43*  --   GLUCOSE 78 71 108* 134* 70  --   BUN 41* 42* 44* 41* 37*  --   CREATININE 1.50* 1.44* 1.24 1.26* 1.11 1.09  CALCIUM 7.8* 8.1*  8.2* 8.6* 8.7*  --   MG 1.6* 1.6* 1.8 1.4* 1.3*  --    GFR: Estimated Creatinine Clearance: 110.8 mL/min (by C-G formula based on SCr of 1.09 mg/dL). Liver Function Tests:  Recent Labs Lab 11/05/16 1133  AST 37  ALT 35  ALKPHOS 125  BILITOT 0.9  PROT 6.5  ALBUMIN 3.0*   No results for input(s): LIPASE, AMYLASE in the last 168 hours. No results for input(s): AMMONIA in the last 168 hours. Coagulation Profile:  Recent Labs Lab 11/11/16 0836  INR 0.89   Cardiac Enzymes: No results for input(s): CKTOTAL, CKMB, CKMBINDEX, TROPONINI in the last 168 hours. BNP (last 3 results) No results for input(s): PROBNP in the last 8760 hours. HbA1C:  Recent Labs  11/10/16 0855  HGBA1C 6.3*   CBG: No results for input(s): GLUCAP in  the last 168 hours. Lipid Profile:  Recent Labs  11/10/16 0855  CHOL 168  HDL 60  LDLCALC 90  TRIG 88  CHOLHDL 2.8   Thyroid Function Tests: No results for input(s): TSH, T4TOTAL, FREET4, T3FREE, THYROIDAB in the last 72 hours. Anemia Panel: No results for input(s): VITAMINB12, FOLATE, FERRITIN, TIBC, IRON, RETICCTPCT in the last 72 hours. Urine analysis:    Component Value Date/Time   LABSPEC 1.025 10/26/2016 1052   PHURINE 5.5 10/26/2016 1052   GLUCOSEU NEGATIVE 10/26/2016 1052   HGBUR TRACE (A) 10/26/2016 1052   BILIRUBINUR SMALL (A) 10/26/2016 1052   KETONESUR NEGATIVE 10/26/2016 1052   PROTEINUR >=300 (A) 10/26/2016 1052   UROBILINOGEN 1.0 10/26/2016 1052   NITRITE NEGATIVE 10/26/2016 1052   LEUKOCYTESUR NEGATIVE 10/26/2016 1052   Sepsis Labs: (procalcitonin:4,lacticidven:4)  ) Recent Results (from the past 240 hour(s))  MRSA PCR Screening     Status: None   Collection Time: 11/06/16  1:40 AM  Result Value Ref Range Status   MRSA by PCR NEGATIVE NEGATIVE Final    Comment:        The GeneXpert MRSA Assay (FDA approved for NASAL specimens only), is one component of a comprehensive MRSA colonization surveillance  program. It is not intended to diagnose MRSA infection nor to guide or monitor treatment for MRSA infections.   MRSA PCR Screening     Status: None   Collection Time: 11/10/16  5:11 AM  Result Value Ref Range Status   MRSA by PCR NEGATIVE NEGATIVE Final    Comment:        The GeneXpert MRSA Assay (FDA approved for NASAL specimens only), is one component of a comprehensive MRSA colonization surveillance program. It is not intended to diagnose MRSA infection nor to guide or monitor treatment for MRSA infections.          Radiology Studies: Dg Chest Port 1 View  Result Date: 11/11/2016 CLINICAL DATA:  Follow-up loculated pleural effusion EXAM: PORTABLE CHEST 1 VIEW COMPARISON:  11/08/2016 FINDINGS: Multifocal patchy opacities, right upper lobe and bilateral lower lobe predominant, suggesting mild to moderate interstitial edema versus multifocal pneumonia. Layering small right pleural effusion, grossly unchanged. No pneumothorax. IMPRESSION: Stable multifocal patchy opacities, suggesting mild to moderate interstitial edema versus multifocal pneumonia. Layering small right pleural effusion, grossly unchanged. Electronically Signed   By: Charline Bills M.D.   On: 11/11/2016 07:39        Scheduled Meds: . chlorhexidine  15 mL Mouth Rinse BID  . heparin  5,000 Units Subcutaneous Q8H  . mouth rinse  15 mL Mouth Rinse q12n4p  . sodium chloride flush  3 mL Intravenous Q12H  . torsemide  40 mg Oral BID   Continuous Infusions: . sodium chloride       LOS: 6 days    Time spent: 40 minutes    WOODS, Roselind Messier, MD [facility] Pager [phone]   If 7PM-7AM, please contact night-coverage [email or web address] Password [email or web address] 11/11/2016, 4:59 PM

## 2016-11-11 NOTE — Progress Notes (Addendum)
Patient has agreed to wear BiPAP this night. This RT set up BiPAP at bedside, explained to patient how it works. Patient is awaiting a nasal swab and then RN will place it on.

## 2016-11-11 NOTE — Progress Notes (Signed)
Paged [facility] regarding pt having pain all over and tylenol being ineffective. Received order for 1x dose of 2 tabs of 5-325mg  of Norco. Pt accepting to pain medication offer. Will continue to monitor.  Viviano Simas, RN

## 2016-11-11 NOTE — Progress Notes (Signed)
Advanced Heart Failure Rounding Note  Primary Cardiologist: [place]  Subjective:    Feeling better this am. Refusing to sign for procedure as he watched video for LEFT heart catheterization with coronary angiography and was confused about his risks.  Explained fully to pt and his cousin, Byrd Hesselbach via phone. He agreed to sign consent.  Negative 6.9 L and weight down 21 lbs. ? Accuracy, although marked diuresis noted.   Objective:   Weight Range: 264 lb (119.7 kg) Body mass index is 35.8 kg/m.   Vital Signs:   Temp:  [97.8 F (36.6 C)-98.9 F (37.2 C)] 97.8 F (36.6 C) (10/04 0611) Pulse Rate:  [83-103] 83 (10/04 0611) Resp:  [18] 18 (10/03 1500) BP: (121-126)/(75-76) 121/76 (10/04 0611) SpO2:  [67 %-99 %] 99 % (10/04 0611) Weight:  [264 lb (119.7 kg)] 264 lb (119.7 kg) (10/04 0611) Last BM Date:  (PTA)  Weight change: Filed Weights   11/09/16 0500 11/10/16 0000 11/11/16 0611  Weight: 290 lb 5.5 oz (131.7 kg) 285 lb 4.8 oz (129.4 kg) 264 lb (119.7 kg)    Intake/Output:   Intake/Output Summary (Last 24 hours) at 11/11/16 0740 Last data filed at 11/11/16 0612  Gross per 24 hour  Intake             1730 ml  Output             8900 ml  Net            -7170 ml      Physical Exam    General:  Fatigued appearing.  HEENT: Normal. On  Neck: Supple. JVP at least 9-10 cm. Carotids 2+ bilat; no bruits. No lymphadenopathy or thyromegaly appreciated. Cor: PMI nondisplaced. Regular rate & rhythm. No rubs, gallops or murmurs. Lungs: Mild basilar crackles.  Abdomen: Soft, nontender, nondistended. No hepatosplenomegaly. No bruits or masses. Good bowel sounds. Extremities: No cyanosis, clubbing, or rash. Trace to 1+ ankle edema.  Neuro: Alert & orientedx3, cranial nerves grossly intact. moves all 4 extremities w/o difficulty. Affect pleasant   Telemetry   NRS, personally reviewed  EKG    N/A  Labs    CBC  Recent Labs  11/09/16 0330 11/11/16 0503  WBC 5.8 4.8    NEUTROABS 4.6 2.6  HGB 15.6 15.6  HCT 51.2 53.5*  MCV 83.5 84.9  PLT 135* 120*   Basic Metabolic Panel  Recent Labs  11/10/16 1030 11/11/16 0503  NA 141 143  K 3.8 3.9  CL 91* 86*  CO2 44* 43*  GLUCOSE 134* 70  BUN 41* 37*  CREATININE 1.26* 1.11  CALCIUM 8.6* 8.7*  MG 1.4* 1.3*   Liver Function Tests No results for input(s): AST, ALT, ALKPHOS, BILITOT, PROT, ALBUMIN in the last 72 hours. No results for input(s): LIPASE, AMYLASE in the last 72 hours. Cardiac Enzymes No results for input(s): CKTOTAL, CKMB, CKMBINDEX, TROPONINI in the last 72 hours.  BNP: BNP (last 3 results)  Recent Labs  11/02/16 0841 11/05/16 1133 11/05/16 1414  BNP 729.6* 1,034.7* 1,019.5*    ProBNP (last 3 results) No results for input(s): PROBNP in the last 8760 hours.   D-Dimer No results for input(s): DDIMER in the last 72 hours. Hemoglobin A1C  Recent Labs  11/10/16 0855  HGBA1C 6.3*   Fasting Lipid Panel  Recent Labs  11/10/16 0855  CHOL 168  HDL 60  LDLCALC 90  TRIG 88  CHOLHDL 2.8   Thyroid Function Tests No results for input(s): TSH, T4TOTAL,  T3FREE, THYROIDAB in the last 72 hours.  Invalid input(s): FREET3  Other results:   Imaging     No results found.   Medications:     Scheduled Medications: . chlorhexidine  15 mL Mouth Rinse BID  . heparin  5,000 Units Subcutaneous Q8H  . mouth rinse  15 mL Mouth Rinse q12n4p  . metolazone  5 mg Oral BID  . sodium chloride flush  3 mL Intravenous Q12H  . sodium chloride flush  3 mL Intravenous Q12H     Infusions: . sodium chloride    . sodium chloride 10 mL/hr at 11/11/16 0607  . sodium chloride    . sodium chloride    . furosemide (LASIX) infusion 10 mg/hr (11/10/16 2144)     PRN Medications:  sodium chloride, sodium chloride, acetaminophen **OR** acetaminophen, hydrALAZINE, HYDROcodone-acetaminophen, ondansetron **OR** ondansetron (ZOFRAN) IV, polyethylene glycol, sodium chloride flush, sodium  chloride flush    Patient Profile   Hunter [name] is a 46 y.o. male history of sarcoidosis, chronic hypoxemic respiratory failure, and focal segmental glomerulosclerosis with nephrotic syndrome.  He was admitted with acute on chronic respiratory failure and volume overload.    Assessment/Plan   1. Acute on chronic hypoxemic/hypercarbic respiratory failure - On home 02.  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 - Concern for OSH/OSA component as well.  - Continue NRBM/Bipap as needed for now to keep 02 sat in 88-92% range. 2. RV failure with suspected pulmonary hypertension - RV dilated/dysfunction on echo with D-shaped interventricular septum - Volume status remains elevated. Continue lasix gtt 10 mg/hr and metolazone 2.5 mg BID. Will address further after cath.  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  3. Focal segmental glomerulosclerosis with nephrotic syndrome - Pt is diuresing now.  - Creatinine continues to improve.   Plan for RHC this afternoon.    Discussed risks of cath with patient. The patient understands that risks included but are not limited to stroke, heart attack, death, bleeding, infection, and allergic reaction [possibly serious].  The patient understands and agrees to proceed.    Length of Stay: [address]  [employer]  11/11/2016, 7:40 AM  Advanced Heart Failure Team Pager [phone] (M-F; 7a - 4p)  Please contact [facility] for night-coverage after hours (4p -7a ) and weekends on [email or web address]   1. Acute on chronic hypoxemic/hypercarbic respiratory failure:  He is on home oxygen at baseline.  Looking at CT from 2016, he appears to have significant pulmonary sarcoidosis.  It is possible that this is the primary  respiratory problem.  However, also some concern for OSH/OSA (never had sleep study).  Pulmonary edema this admission may contribute.  - Needs high resolution CT chest to evaluate sarcoidosis after full diuresis => maybe tomorrow.  - Oxygen weaned successfully to 3L [place].  2. 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 He is still volume overloaded on exam but improving.  Suspect significant pulmonary hypertension though unable to get accurate PA pressure estimation from echo.  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  Treatment with selective pulmonary vasodilators does not have great evidence in this population.  There may be some benefit with PAH and more limited sarcoidosis parenchymal disease, but with extensive parenchymal disease  benefit is probably marginal at best.  He is now diuresing well with current regimen.  - As above, need eventual high resolution CT chest for evaluation of pulmonary sarcoidosis extent.  - Would continue Lasix gtt at 10 mg/hr + metolazone 5 mg bid => will assess ongoing diuretic need after RHC today.   - Will plan on RHC today to assess hemodynamics.  Risks/benefits discussed with patient and he agrees to proceed.  Can consider treatment of PAH if lung parenchymal disease is not markedly extensive.  Also need to keep oxygen saturation up to avoid hypoxic pulmonary vasoconstriction.  3. Focal segmental glomerulosclerosis with nephrotic syndrome: As above, patient now diuresing well. Creatinine improving with diuresis, likely due to decreased renal venous pressure and decompression of RV.   - Will continue current diuretics but may cut back based on RHC findings.   - Renal has been following.   Marca Ancona 11/11/2016 9:02 AM

## 2016-11-11 NOTE — Plan of Care (Signed)
Problem: Fluid Volume: Goal: Ability to maintain a balanced intake and output will improve Outcome: Progressing Patient refusing to sign cardiac cath consent until he speaks to a cardiologist on 11/11/16 pre-procedure. Cath pre-procedure video watched.   Patient educated on importance of decreasing oral intake to increase efficacy of diuresing. Patient verbally agrees with teach back.   NSR on tele. Patient refusing bipap at night. 5 liters nasal canula 96%. Pain adequately controlled with new PRN pain medications.

## 2016-11-11 NOTE — Plan of Care (Signed)
Problem: Not Ready for Diet/Lifestyle Change (NB-1.3) Goal: Nutrition education Formal process to instruct or train a patient/client in a skill or to impart knowledge to help patients/clients voluntarily manage or modify food choices and eating behavior to maintain or improve health. Outcome: Completed/Met Date Met: 11/11/16  RD consulted for nutrition education regarding pre-diabetes.   Lab Results  Component Value Date   HGBA1C 6.3 (H) 11/10/2016    RD provided "Heart Healthy Consistent Carbohydrate Nutrition Therapy" handout from the Academy of Nutrition and Dietetics. Provided list of carbohydrates and recommended serving sizes of common foods.  Expect poor compliance. Patient was not interested in education.  Body mass index is 35.8 kg/m. Pt meets criteria for obesity based on current BMI.  Molli Barrows, RD, LDN, [place] Pager [phone] After Hours Pager [phone]

## 2016-11-11 NOTE — H&P (View-Only) (Signed)
  Advanced Heart Failure Rounding Note  Primary Cardiologist: Tillery  Subjective:    Feeling better this am. Refusing to sign for procedure as he watched video for LEFT heart catheterization with coronary angiography and was confused about his risks.  Explained fully to pt and his cousin, Maria via phone. He agreed to sign consent.  Negative 6.9 L and weight down 21 lbs. ? Accuracy, although marked diuresis noted.   Objective:   Weight Range: 264 lb (119.7 kg) Body mass index is 35.8 kg/m.   Vital Signs:   Temp:  [97.8 F (36.6 C)-98.9 F (37.2 C)] 97.8 F (36.6 C) (10/04 0611) Pulse Rate:  [83-103] 83 (10/04 0611) Resp:  [18] 18 (10/03 1500) BP: (121-126)/(75-76) 121/76 (10/04 0611) SpO2:  [67 %-99 %] 99 % (10/04 0611) Weight:  [264 lb (119.7 kg)] 264 lb (119.7 kg) (10/04 0611) Last BM Date:  (PTA)  Weight change: Filed Weights   11/09/16 0500 11/10/16 0000 11/11/16 0611  Weight: 290 lb 5.5 oz (131.7 kg) 285 lb 4.8 oz (129.4 kg) 264 lb (119.7 kg)    Intake/Output:   Intake/Output Summary (Last 24 hours) at 11/11/16 0740 Last data filed at 11/11/16 0612  Gross per 24 hour  Intake             1730 ml  Output             8900 ml  Net            -7170 ml      Physical Exam    General:  Fatigued appearing.  HEENT: Normal. On [place]: Supple. JVP at least 9-10 cm. Carotids 2+ bilat; no bruits. No lymphadenopathy or thyromegaly appreciated. Cor: PMI nondisplaced. Regular rate & rhythm. No rubs, gallops or murmurs. Lungs: Mild basilar crackles.  Abdomen: Soft, nontender, nondistended. No hepatosplenomegaly. No bruits or masses. Good bowel sounds. Extremities: No cyanosis, clubbing, or rash. Trace to 1+ ankle edema.  Neuro: Alert & orientedx3, cranial nerves grossly intact. moves all 4 extremities w/o difficulty. Affect pleasant   Telemetry   NRS, personally reviewed  EKG    N/A  Labs    CBC  Recent Labs  11/09/16 0330 11/11/16 0503  WBC 5.8 4.8    NEUTROABS 4.6 2.6  HGB 15.6 15.6  HCT 51.2 53.5*  MCV 83.5 84.9  PLT 135* 120*   Basic Metabolic Panel  Recent Labs  11/10/16 1030 11/11/16 0503  NA 141 143  K 3.8 3.9  CL 91* 86*  CO2 44* 43*  GLUCOSE 134* 70  BUN 41* 37*  CREATININE 1.26* 1.11  CALCIUM 8.6* 8.7*  MG 1.4* 1.3*   Liver Function Tests No results for input(s): AST, ALT, ALKPHOS, BILITOT, PROT, ALBUMIN in the last 72 hours. No results for input(s): LIPASE, AMYLASE in the last 72 hours. Cardiac Enzymes No results for input(s): CKTOTAL, CKMB, CKMBINDEX, TROPONINI in the last 72 hours.  BNP: BNP (last 3 results)  Recent Labs  11/02/16 0841 11/05/16 1133 11/05/16 1414  BNP 729.6* 1,034.7* 1,019.5*    ProBNP (last 3 results) No results for input(s): PROBNP in the last 8760 hours.   D-Dimer No results for input(s): DDIMER in the last 72 hours. Hemoglobin A1C  Recent Labs  11/10/16 0855  HGBA1C 6.3*   Fasting Lipid Panel  Recent Labs  11/10/16 0855  CHOL 168  HDL 60  LDLCALC 90  TRIG 88  CHOLHDL 2.8   Thyroid Function Tests No results for input(s): TSH, T4TOTAL,   T3FREE, THYROIDAB in the last 72 hours.  Invalid input(s): FREET3  Other results:   Imaging     No results found.   Medications:     Scheduled Medications: . chlorhexidine  15 mL Mouth Rinse BID  . heparin  5,000 Units Subcutaneous Q8H  . mouth rinse  15 mL Mouth Rinse q12n4p  . metolazone  5 mg Oral BID  . sodium chloride flush  3 mL Intravenous Q12H  . sodium chloride flush  3 mL Intravenous Q12H     Infusions: . sodium chloride    . sodium chloride 10 mL/hr at 11/11/16 0607  . sodium chloride    . sodium chloride    . furosemide (LASIX) infusion 10 mg/hr (11/10/16 2144)     PRN Medications:  sodium chloride, sodium chloride, acetaminophen **OR** acetaminophen, hydrALAZINE, HYDROcodone-acetaminophen, ondansetron **OR** ondansetron (ZOFRAN) IV, polyethylene glycol, sodium chloride flush, sodium  chloride flush    Patient Profile   Hunter [name] is a 46 y.o. male history of sarcoidosis, chronic hypoxemic respiratory failure, and focal segmental glomerulosclerosis with nephrotic syndrome.  He was admitted with acute on chronic respiratory failure and volume overload.    Assessment/Plan   1. Acute on chronic hypoxemic/hypercarbic respiratory failure - On home 02.  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 - Concern for OSH/OSA component as well.  - Continue NRBM/Bipap as needed for now to keep 02 sat in 88-92% range. 2. RV failure with suspected pulmonary hypertension - RV dilated/dysfunction on echo with D-shaped interventricular septum - Volume status remains elevated. Continue lasix gtt 10 mg/hr and metolazone 2.5 mg BID. Will address further after cath.  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  3. Focal segmental glomerulosclerosis with nephrotic syndrome - Pt is diuresing now.  - Creatinine continues to improve.   Plan for RHC this afternoon.    Discussed risks of cath with patient. The patient understands that risks included but are not limited to stroke, heart attack, death, bleeding, infection, and allergic reaction [possibly serious].  The patient understands and agrees to proceed.    Length of Stay: 6  Michael Andrew Tillery, PA-C  11/11/2016, 7:40 AM  Advanced Heart Failure Team Pager [phone] (M-F; 7a - 4p)  Please contact [facility] for night-coverage after hours (4p -7a ) and weekends on [email or web address]   1. Acute on chronic hypoxemic/hypercarbic respiratory failure:  He is on home oxygen at baseline.  Looking at CT from 2016, he appears to have significant pulmonary sarcoidosis.  It is possible that this is the primary  respiratory problem.  However, also some concern for OSH/OSA (never had sleep study).  Pulmonary edema this admission may contribute.  - Needs high resolution CT chest to evaluate sarcoidosis after full diuresis => maybe tomorrow.  - Oxygen weaned successfully to 3L [place].  2. 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 He is still volume overloaded on exam but improving.  Suspect significant pulmonary hypertension though unable to get accurate PA pressure estimation from echo.  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  Treatment with selective pulmonary vasodilators does not have great evidence in this population.  There may be some benefit with PAH and more limited sarcoidosis parenchymal disease, but with extensive parenchymal disease   benefit is probably marginal at best.  He is now diuresing well with current regimen.  - As above, need eventual high resolution CT chest for evaluation of pulmonary sarcoidosis extent.  - Would continue Lasix gtt at 10 mg/hr + metolazone 5 mg bid => will assess ongoing diuretic need after RHC today.   - Will plan on RHC today to assess hemodynamics.  Risks/benefits discussed with patient and he agrees to proceed.  Can consider treatment of PAH if lung parenchymal disease is not markedly extensive.  Also need to keep oxygen saturation up to avoid hypoxic pulmonary vasoconstriction.  3. Focal segmental glomerulosclerosis with nephrotic syndrome: As above, patient now diuresing well. Creatinine improving with diuresis, likely due to decreased renal venous pressure and decompression of RV.   - Will continue current diuretics but may cut back based on RHC findings.   - Renal has been following.   Dalton McLean 11/11/2016 9:02 AM  

## 2016-11-11 NOTE — Progress Notes (Signed)
Nutrition Consult / Follow-up  DOCUMENTATION CODES:   Obesity, unspecified  INTERVENTION:    Diet education provided. "Heart Healthy Consistent Carbohydrate Nutrition Therapy" handout from the [employer] left with patient.   NUTRITION DIAGNOSIS:   Inadequate oral intake related to acute illness as evidenced by meal completion < 25%.  Resolved  GOAL:   Patient will meet greater than or equal to 90% of their needs  Met  MONITOR:   PO intake  ASSESSMENT:   46 y.o. male with medical history significant of sarcoidosis, chronic hypoxic respiratory failure, hypertension, FSGS, CKD stage III. He is a very poor historian. He is supposed to be on home oxygen at baseline, but due to being at a homeless shelter, has not been using it in the past month. Labs were obtained which revealed BNP 1019. Chest x-ray revealed findings consistent with congestive heart failure.  Patient is currently NPO for a right heart cath today. He has been consuming 100% of all heart healthy CHO modified meals. He refuses supplements.   Discussed heart healthy, CHO modified guidelines with patient. He was not interested at all. Left handouts at bedside for him to review with RD contact information for questions.   Labs and medications reviewed.  Diet Order:  Diet NPO time specified Except for: Sips with Meds  Skin:  Wound (see comment) (stage 1 to nose)  Last BM:  pta  Height:   Ht Readings from Last 1 Encounters:  11/10/16 6' (1.829 m)    Weight:   Wt Readings from Last 1 Encounters:  11/11/16 264 lb (119.7 kg)    Ideal Body Weight:  80.9 kg  BMI:  Body mass index is 35.8 kg/m.  Estimated Nutritional Needs:   Kcal:  2100-2300  Protein:  120-135 gm  Fluid:  2 L  EDUCATION NEEDS:   Education needs addressed  Molli Barrows, [place], [place], [place] Pager [phone] After Hours Pager [phone]

## 2016-11-12 DIAGNOSIS — E669 Obesity, unspecified: Secondary | ICD-10-CM

## 2016-11-12 DIAGNOSIS — I5033 Acute on chronic diastolic (congestive) heart failure: Secondary | ICD-10-CM

## 2016-11-12 DIAGNOSIS — J984 Other disorders of lung: Secondary | ICD-10-CM

## 2016-11-12 LAB — BASIC METABOLIC PANEL
BUN: 43 mg/dL — ABNORMAL HIGH (ref 6–20)
CALCIUM: 8.4 mg/dL — AB (ref 8.9–10.3)
CO2: >50 mmol/L — ABNORMAL HIGH (ref 22–32)
Chloride: 76 mmol/L — ABNORMAL LOW (ref 101–111)
Creatinine, Ser: 1.32 mg/dL — ABNORMAL HIGH (ref 0.61–1.24)
GFR calc Af Amer: >60 mL/min (ref >60)
Glucose, Bld: 124 mg/dL — ABNORMAL HIGH (ref 65–99)
POTASSIUM: 3.4 mmol/L — AB (ref 3.5–5.1)
Sodium: 139 mmol/L (ref 135–145)

## 2016-11-12 LAB — MAGNESIUM: MAGNESIUM: 1.7 mg/dL (ref 1.7–2.4)

## 2016-11-12 MED ORDER — POTASSIUM CHLORIDE CRYS ER 20 MEQ PO TBCR
40.0000 meq | EXTENDED_RELEASE_TABLET | Freq: Once | ORAL | Status: AC
  Administered 2016-11-12: 40 meq via ORAL
  Filled 2016-11-12: qty 2

## 2016-11-12 MED ORDER — MAGNESIUM SULFATE 4 GM/100ML IV SOLN
4.0000 g | Freq: Once | INTRAVENOUS | Status: AC
  Administered 2016-11-12: 4 g via INTRAVENOUS
  Filled 2016-11-12: qty 100

## 2016-11-12 NOTE — Progress Notes (Signed)
RT tried to place pt on CPAP twice tonight. The first time around 2000. Pt refused stating that the full faced mask didn't fit him right and he felt like he was suffocating. I tried fitting pt with Nasal mask and pt still complained of the same thing and each time pt started to act spaced out and like he was choking. Also mentioned that he felt like he wasn't going to wake up if he had those masks on and only wanted to wear the nasal cannula. Placed pt back on nasal cannula 3L. RN aware. RT to monitor as needed.

## 2016-11-12 NOTE — Progress Notes (Signed)
Progress Note    Hunter [name]  ZOX:[id number] DOB: 1970/11/12  DOA: 11/05/2016 PCP: Bing Neighbors, FNP    Brief Narrative:   Chief complaint: F/U right sided hear failure  Medical records reviewed and are as summarized below:  Hunter [name] is an 46 y.o. male with a PMH of Sarcoidosis, Chronic Respiratory Failure with Hypoxia (on home O2 noncompliant), HTN, FSGS, CKD stage III. Tobacco abuse, OSA, Obesity Hypoventilation syndrome, homelessness who was admitted 11/05/16 for evaluation of worsening shortness of breath and lower extremity edema. It appears his PCP recently changed his diuretics.  Assessment/Plan:   Principle problems: Acute RIGHT HEART failure/Chronic Diastolic CHF Multifactorial noncompliance with medication, noncompliance with nutrition, sarcoidosis, OSA/OHS untreated. 11/11/16 S/P RIGHT HEART catheterization. Cardiology to arrange for HRCT chest. Has diuresed well. Strict in and out since admission: 28.5 L, weight declining. Continue Torsemide and Hydralazine.  Acute on Chronic Respiratory Failure with Hypoxia and Hypercapnia Noncompliant with home O2. Per patient on 2-3 L O2 via [place]. Unable to tolerate Bipap and likely will not be compliant. Will require sleep study as outpatient. Has not been consistently using his home oxygen due to residing at a homeless shelter. Poor healthcare literacy despite multiple providers educating him.  Loculated RIGHT Pleural Effusion PCXR 11/08/16: partially loculated right pleural effusion. Repeat PCXR on 11/11/16--> Effusion was the same or worsened will need to consider IR for drainage. Do not believe patient will be candidate for VATS. No evidence of pneumonia clinically.  Active problems: Hypokalemia Replete.  Acute on CKD stageIII with nephropathy (baseline Cr 0.92 in 09/13/2016)  Biopsy results 2017: Focal and segmental glomerulosclerosis+ early diabetic glomerulopathy mild arteriosclerosis with mild to moderate  tubulointerstitial scarring. Significantly improved with diuresis. Nephrology following.  Pre-Diabetes Per kidney biopsy early diabetic glomerulopathy. Hgb A1c 6.3%. Patient meets criteria for prediabetes. Per Diabetic Nutritionist note patient uninterested in diabetic education.   HLD Continue Lipitor 40 mg daily.    Sarcoidosis of lung Has followed with pulmonology in the past, now off prednisone. Cardiology to arrange for HRCT chest.  Morbid obesity Body mass index is 41.09 kg/m.  Homelessness Social worker consulted; patient would like to apply for disability, discuss payment plan options for hospitalization.  OSA/OHS Although no official sleep study patient clearly has signs and symptoms consistent with OSA/OHS to include hypercarbia. Arrange outpatient sleep study. BiPAP QHS while in hospital due to risk of hypercarbia (refusing, will try a different mask). Consulted CM for Trilogy for home use however lack of financial resources will prove challenging.  Left ankle gout Uric acid 11.9. Continue prednisone.  Hypomagnesemia Supplemented.   Family Communication/Anticipated D/C date and plan/Code Status   DVT prophylaxis: Heparin ordered. Code Status: Full Code.  Family Communication: No family present at bedside. Disposition Plan: Homeless, CM consulted.   Medical Consultants:    Nephrology  Cardiology  PCCM   Anti-Infectives:    None  Subjective:   Still having dyspnea. No chest pain. Feels like he is cramping some. Appetite okay.  Objective:    Vitals:   11/11/16 1730 11/11/16 2010 11/12/16 0416 11/12/16 0600  BP: (!) 110/59 112/70 122/74   Pulse:  91    Resp:  (!) 22 (!) 22   Temp:   98.1 F (36.7 C)   TempSrc:   Oral   SpO2:  97% 95%   Weight:    117 kg (258 lb)  Height:        Intake/Output Summary (Last 24 hours) at 11/12/16 8119  Last data filed at 11/12/16 0400  Gross per 24 hour  Intake              480 ml  Output              4650 ml  Net            -4170 ml   Filed Weights   11/10/16 0000 11/11/16 0611 11/12/16 0600  Weight: 129.4 kg (285 lb 4.8 oz) 119.7 kg (264 lb) 117 kg (258 lb)    Exam: General: Morbidly obese. No acute distress. Cardiovascular: Heart sounds show a regular rate, and rhythm. No gallops or rubs. No murmurs. Mild JVD. Lungs: Clear to auscultation bilaterally with good air movement. No rales, rhonchi or wheezes. Mildly increased work of breathing noted. Abdomen: Soft, nontender, nondistended with normal active bowel sounds. No masses. No hepatosplenomegaly. Neurological: Alert and oriented 3. Moves all extremities 4 with equal strength. Cranial nerves II through XII grossly intact. Skin: Warm and dry. No rashes or lesions. Extremities: No clubbing or cyanosis. No edema. Pedal pulses 2+. Psychiatric: Mood and affect are anxious. Insight and judgment are extremely poor.   Data Reviewed:   I have personally reviewed following labs and imaging studies:  Labs: Labs show the following:   Basic Metabolic Panel:  Recent Labs Lab 11/08/16 0327 11/09/16 0330 11/10/16 1030 11/11/16 0503 11/11/16 1408 11/12/16 0415  NA 142 141 141 143  --  139  K 4.5 4.9 3.8 3.9  --  3.4*  CL 100* 98* 91* 86*  --  76*  CO2 33* 34* 44* 43*  --  >50*  GLUCOSE 71 108* 134* 70  --  124*  BUN 42* 44* 41* 37*  --  43*  CREATININE 1.44* 1.24 1.26* 1.11 1.09 1.32*  CALCIUM 8.1* 8.2* 8.6* 8.7*  --  8.4*  MG 1.6* 1.8 1.4* 1.3*  --  1.7   GFR Estimated Creatinine Clearance: 90.4 mL/min (A) (by C-G formula based on SCr of 1.32 mg/dL (H)). Liver Function Tests:  Recent Labs Lab 11/05/16 1133  AST 37  ALT 35  ALKPHOS 125  BILITOT 0.9  PROT 6.5  ALBUMIN 3.0*   Coagulation profile  Recent Labs Lab 11/11/16 0836  INR 0.89    CBC:  Recent Labs Lab 11/07/16 0344 11/08/16 0327 11/09/16 0330 11/11/16 0503 11/11/16 1408  WBC 4.9 6.4 5.8 4.8 4.4  NEUTROABS 3.2 4.2 4.6 2.6  --   HGB 14.2  14.8 15.6 15.6 16.1  HCT 50.3 51.6 51.2 53.5* 55.8*  MCV 87.0 85.9 83.5 84.9 86.9  PLT 146* 132* 135* 120* 127*   Hgb A1c:  Recent Labs  11/10/16 0855  HGBA1C 6.3*   Lipid Profile:  Recent Labs  11/10/16 0855  CHOL 168  HDL 60  LDLCALC 90  TRIG 88  CHOLHDL 2.8   Microbiology Recent Results (from the past 240 hour(s))  MRSA PCR Screening     Status: None   Collection Time: 11/06/16  1:40 AM  Result Value Ref Range Status   MRSA by PCR NEGATIVE NEGATIVE Final    Comment:        The GeneXpert MRSA Assay (FDA approved for NASAL specimens only), is one component of a comprehensive MRSA colonization surveillance program. It is not intended to diagnose MRSA infection nor to guide or monitor treatment for MRSA infections.   MRSA PCR Screening     Status: None   Collection Time: 11/10/16  5:11 AM  Result Value Ref  Range Status   MRSA by PCR NEGATIVE NEGATIVE Final    Comment:        The GeneXpert MRSA Assay (FDA approved for NASAL specimens only), is one component of a comprehensive MRSA colonization surveillance program. It is not intended to diagnose MRSA infection nor to guide or monitor treatment for MRSA infections.     Procedures and diagnostic studies:  Dg Chest Port 1 View  Result Date: 11/11/2016 CLINICAL DATA:  Follow-up loculated pleural effusion EXAM: PORTABLE CHEST 1 VIEW COMPARISON:  11/08/2016 FINDINGS: Multifocal patchy opacities, right upper lobe and bilateral lower lobe predominant, suggesting mild to moderate interstitial edema versus multifocal pneumonia. Layering small right pleural effusion, grossly unchanged. No pneumothorax. IMPRESSION: Stable multifocal patchy opacities, suggesting mild to moderate interstitial edema versus multifocal pneumonia. Layering small right pleural effusion, grossly unchanged. Electronically Signed   By: Charline Bills M.D.   On: 11/11/2016 07:39    Medications:   . atorvastatin  40 mg Oral q1800  .  chlorhexidine  15 mL Mouth Rinse BID  . heparin  5,000 Units Subcutaneous Q8H  . mouth rinse  15 mL Mouth Rinse q12n4p  . potassium chloride  40 mEq Oral Once  . potassium chloride  40 mEq Oral Once  . sodium chloride flush  3 mL Intravenous Q12H  . torsemide  40 mg Oral BID   Continuous Infusions: . sodium chloride    . magnesium sulfate 1 - 4 g bolus IVPB       LOS: 7 days   Hunter [name],CHRISTINA  [facility] Pager [phone]. If unable to reach me by pager, please call my cell phone at [phone].  *Please refer to [email or web address], password [email or web address] to get updated schedule on who will round on this patient, as hospitalists switch teams weekly. If 7PM-7AM, please contact night-coverage at [email or web address], password [email or web address] for any overnight needs.  11/12/2016, 9:14 AM

## 2016-11-12 NOTE — Progress Notes (Signed)
Name: Hunter [name] MRN: [id number] DOB: December 03, 1970    ADMISSION DATE:  11/05/2016   CONSULTATION DATE:  11/06/2016  REFERRING MD :  Dr Alvino Chapel  CHIEF COMPLAINT:  Acute hypercapnic respiratory failure  BRIEF PATIENT DESCRIPTION: 46 y.o. 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 Questionable obesity hypoventilation syndrome based on. Also may have obstructive sleep apnea. Started on noninvasive positive pressure ventilation and Lasix drip. Denies any history of polysomnogram or witnessed apneas. No steroid therapy for sarcoidosis for years. Reportedly no other organ involvement of sarcoidosis.  SUBJECTIVE:  Poor insight. Follows commands  VITAL SIGNS: Temp:  [98 F (36.7 C)-98.1 F (36.7 C)] 98.1 F (36.7 C) (10/05 0416) Pulse Rate:  [0-91] 91 (10/04 2010) Resp:  [0-89] 22 (10/05 0416) BP: (110-139)/(59-96) 122/74 (10/05 0416) SpO2:  [0 %-97 %] 95 % (10/05 0416) Weight:  [258 lb (117 kg)] 258 lb (117 kg) (10/05 0600)  PHYSICAL EXAMINATION: General:  Obese male. Sitting up eating. NAD. Shows little insight in to dz process. "Am I going to die" HEENT: No JVD/LAN WUJ:WJXBJYN affect. Poor understanding despite multiple explanations from multiple services. Neuro: Intact CV: s1s2 rrr, no m/r/g PULM: even/non-labored, lungs bilaterally , decreased in bases WG:NFAO, non-tender, bsx4 active  Extremities: warm/dry, +edema  Skin: no rashes or lesions    Recent Labs Lab 11/10/16 1030 11/11/16 0503 11/11/16 1408 11/12/16 0415  NA 141 143  --  139  K 3.8 3.9  --  3.4*  CL 91* 86*  --  76*  CO2 44* 43*  --  >50*  BUN 41* 37*  --  43*  CREATININE 1.26* 1.11 1.09 1.32*  GLUCOSE 134* 70  --  124*    Recent Labs Lab 11/09/16 0330 11/11/16 0503 11/11/16 1408  HGB 15.6 15.6 16.1  HCT 51.2 53.5* 55.8*  WBC 5.8 4.8 4.4  PLT 135* 120* 127*   ABG    Component Value Date/Time   PHART  7.315 (L) 11/09/2016 0419   PCO2ART 74.6 (HH) 11/09/2016 0419   PO2ART 64.0 (L) 11/09/2016 0419   HCO3 59.9 (H) 11/11/2016 1320   HCO3 58.6 (H) 11/11/2016 1320   TCO2 >50 (H) 11/11/2016 1320   TCO2 >50 (H) 11/11/2016 1320   ACIDBASEDEF 1.8 11/06/2016 0825   O2SAT 66.0 11/11/2016 1320   O2SAT 65.0 11/11/2016 1320   Dg Chest Port 1 View  Result Date: 11/11/2016 CLINICAL DATA:  Follow-up loculated pleural effusion EXAM: PORTABLE CHEST 1 VIEW COMPARISON:  11/08/2016 FINDINGS: Multifocal patchy opacities, right upper lobe and bilateral lower lobe predominant, suggesting mild to moderate interstitial edema versus multifocal pneumonia. Layering small right pleural effusion, grossly unchanged. No pneumothorax. IMPRESSION: Stable multifocal patchy opacities, suggesting mild to moderate interstitial edema versus multifocal pneumonia. Layering small right pleural effusion, grossly unchanged. Electronically Signed   By: Charline Bills M.D.   On: 11/11/2016 07:39   IMAGING/STUDIES: PFT 02/12/16: FVC 1.72 L (38%) FEV1 1.23 L (34%) FEV1/FVC 0 point someone FEF 25-75 0.74 L (20%) negative bronchodilator response TLC 3.99 L (55%) RV 105% DLCO corrected 62% TTE 9/29:  LV normal in size with EF 55-60% & normal regional wall motion. LA normal in size & RA severely dilated. RV dilated with moderately reduced systolic function. Pulmonary artery systolic pressure 32 mmHg. No aortic stenosis or regurgitation. Mild aortic root dilatation. No mitral stenosis or regurgitation. Mild pulmonic regurgitation with poorly visualized valve. Mild tricuspid regurgitation. No pericardial effusion.  CXR PA/LAT 9/28:  Previously reviewed by me. Low lung volumes. Patchy bilateral lower lung predominant alveolar opacification as well as blunting of costophrenic angles consistent with pleural effusions. Fluid within right fissure as well. Borderline cardiomegaly with normal mediastinal and are contours. PORT CXR 10/1:  Personally reviewed  by me. Regarding view. Some improvement in opacification in the right lower lung. Questionable slight increase in right pleural effusion. No new focal opacity. Mildly increased interstitial markings bilaterally.   MICROBIOLOGY: MRSA PCR 9/29:  Negative  ANTIBIOTICS:  LINES/TUBES: Foley 9/29 >>> PIV  SIGNIFICANT EVENTS: 09/28 - Admit 10/4 RHC RHC 11/11/16 Hemodynamics (mmHg) RA mean 6 RV 46/9 PA 49/20, mean 30 PCWP mean 10  Oxygen saturations: PA 66% AO 93%  Cardiac Output (Fick) 5.57  Cardiac Index (Fick) 2.32 PVR 3.6 WU   Intake/Output Summary (Last 24 hours) at 11/12/16 1005 Last data filed at 11/12/16 0400  Gross per 24 hour  Intake              480 ml  Output             4650 ml  Net            -4170 ml    ASSESSMENT / PLAN:   46 y.o. Male with acute on chronic hypercarbic respiratory failure and acute on chronic hypoxic respiratory failure. No evidence of sarcoidosis flare. Pulmonary hypertension suggested by echo. Plan for transfer to [facility] today for evaluation and evaluation for left and right heart catheterization.  1. Acute on chronic hypercarbic respiratory failure: Recommended continued use of nocturnal BiPAP patient. Likely will need outpatient titration. Preventing hyperoxygenation to limit V/Q mismatching. He is poorly compliant with on hour use. Wants nasal pillows. 2. Acute on chronic hypoxic respiratory failure: Diuresis as per cardiology. Target saturation 88-94% to prevent V/Q mismatching. Post CC 10/4 as noted 3. Note  Neg 4 liters in 24 hours 4. Moderate restrictive lung disease: Likely secondary to obesity and congestive heart failure. Plan for high-resolution CT of the chest is pending. CHF seems main culprit of acute resp distress.  5. Sarcoidosis: No evidence of acute flare. Plan for cardiac PET/CT as an outpatient. 6. Pulmonary hypertension: Note rhc.   Remainder of care as per primary service and other consultants.  Brett Canales Tereso Unangst ACNP Adolph Pollack PCCM Pager [phone] till 3 pm If no answer page [phone] 11/12/2016, 10:02 AM

## 2016-11-12 NOTE — Care Management Note (Addendum)
Case Management Note  Patient Details  Name: Hunter [name] MRN: [id number] Date of Birth: Oct 01, 1970  Subjective/Objective: Pt presented for SOB and edema.  PTA pt was working part-time and living in [employer] for 3-4 weeks. Pt plans to return to work and is uninsured.  Pt interested in applying for assistance.  Pt had home O2 through [employer] but was not using at [facility] and was storing it in a Museum/gallery conservator, [employer] [phone]. Pt lives alone and has no family in the area. Pt a client of [facility] and using [facility] for medications.    Action/Plan: CSW and Biomedical engineer consulted.  RN at [facility] [phone], contacted and states that patient may use ventilator and O2 at [facility]. Pt has a disability sticker at [employer], which means he can stay in the dayroom until 10:30am.  After 10:30, he cannot return to dayroom until 1pm.  CM inquired about pt being a candidate for HOPES program.  CSW consulted with director of SW to see if patient is eligible for [employer].  [employer] requires monthly fee for O2 and ventilator, which patient will not be able to afford ($1200/month for Trilogy). [facility] to provide price for Trilogy, but will not be able to provide charity care. Follow-up appt made with [facility].  Expected Discharge Date:   (unknown)               Expected Discharge Plan:  Homeless Shelter (Referral placed for [employer] project)  In-House Referral:  Clinical Social Work, Museum/gallery exhibitions officer  CM Consult, Follow-up appt scheduled, [employer], Medication Assistance  Post Acute Care Choice:  Durable Medical Equipment Choice offered to:  Patient  DME Arranged:  Ventilator DME Agency:     HH Arranged:    HH Agency:     Status of Service:  In process, will continue to follow  If discussed at Long Length of Stay Meetings, dates discussed:    Additional  Comments:  Pt approved for Letter of Guarantee for Trilogy vent for 1 month.  Hope will arrive on Sunday to arrange.  Pt will apply for Medicaid in the next month.  MD please place order for Trilogy vent through [facility].  Deveron Furlong, RN, CM 11/12/16 1623  Pt is eligible for HOPES project if Trilogy vent can be obtained.  CM consulted [employer] ($1600/mo). Minerva Areola will be in contact with CM to discuss payment alternatives.  [facility] cost is $2005/month.  CM reached out to PA in regards to pt assistance with Trilogy. CM alerted pt he will go to HOPES project only if Trilogy vent obtained.  Otherwise, will go to [employer]. Deveron Furlong, RN 1600 11/12/16 Verdene Lennert, RN 11/12/2016, 11:48 AM

## 2016-11-12 NOTE — Progress Notes (Addendum)
4:48pm: Patient has been approved for HOPEs project. CSW Director will be working with CSW to arrange paperwork on Sunday.   11:50am: CSW received consult regarding HOPEs Project for patient. CSW Director is checking into it.   Osborne Casco Melita Villalona LCSWA [phone]

## 2016-11-12 NOTE — Progress Notes (Signed)
Pt's IV magnesium running and pt complained of his arm burning and feeling numb and tingly. Infusion stopped. IV flushed.Pt denied any pain when IV was being flushed. IV site clean dry and intact. No redness noted. IV site clean and dry. Pt stated his arm felt better when infusion was stopped. HF team paged. Spoke with Dr. Darnelle Catalan and made her aware of the magnesium being finished. Will cont to monitor pt.

## 2016-11-12 NOTE — Progress Notes (Signed)
Advanced Heart Failure Rounding Note  Primary Cardiologist: [place]  Subjective:    Breathing feels much better. "Cramping" Feels like he needs to "clench up". Worried about his finances and returning to work. Denies lightheadedness or dizziness   Negative 4.1 L. Down another 6 lbs. Down total 27 lbs.   High Res CT Chest: Results pending  RHC 11/11/16 Hemodynamics (mmHg) RA mean 6 RV 46/9 PA 49/20, mean 30 PCWP mean 10 Oxygen saturations: PA 66% AO 93% Cardiac Output (Fick) 5.57  Cardiac Index (Fick) 2.32 PVR 3.6 WU   Objective:   Weight Range: 258 lb (117 kg) Body mass index is 34.99 kg/m.   Vital Signs:   Temp:  [98 F (36.7 C)-98.1 F (36.7 C)] 98.1 F (36.7 C) (10/05 0416) Pulse Rate:  [0-91] 91 (10/04 2010) Resp:  [0-89] 22 (10/05 0416) BP: (110-139)/(59-96) 122/74 (10/05 0416) SpO2:  [0 %-97 %] 95 % (10/05 0416) Weight:  [258 lb (117 kg)] 258 lb (117 kg) (10/05 0600) Last BM Date: 11/11/16  Weight change: Filed Weights   11/10/16 0000 11/11/16 0611 11/12/16 0600  Weight: 285 lb 4.8 oz (129.4 kg) 264 lb (119.7 kg) 258 lb (117 kg)    Intake/Output:   Intake/Output Summary (Last 24 hours) at 11/12/16 0740 Last data filed at 11/12/16 0400  Gross per 24 hour  Intake              480 ml  Output             4650 ml  Net            -4170 ml      Physical Exam    General: Fatigued appearing. No resp difficulty. HEENT: Normal on [place]ck: Supple. JVP 6-7 cm. Carotids 2+ bilat; no bruits. No thyromegaly or nodule noted. Cor: PMI nondisplaced. RRR, No M/G/R noted Lungs: CTAB, normal effort. Abdomen: Soft, non-tender, non-distended, no HSM. No bruits or masses. +BS  Extremities: No cyanosis, clubbing, or rash. Trace ankle edema.  Neuro: Alert & orientedx3, cranial nerves grossly intact. moves all 4 extremities w/o difficulty. Affect pleasant   Telemetry   NSR, Personally reviewed.    EKG    N/A  Labs    CBC  Recent Labs   11/11/16 0503 11/11/16 1408  WBC 4.8 4.4  NEUTROABS 2.6  --   HGB 15.6 16.1  HCT 53.5* 55.8*  MCV 84.9 86.9  PLT 120* 127*   Basic Metabolic Panel  Recent Labs  11/11/16 0503 11/11/16 1408 11/12/16 0415  NA 143  --  139  K 3.9  --  3.4*  CL 86*  --  76*  CO2 43*  --  >50*  GLUCOSE 70  --  124*  BUN 37*  --  43*  CREATININE 1.11 1.09 1.32*  CALCIUM 8.7*  --  8.4*  MG 1.3*  --  1.7   Liver Function Tests No results for input(s): AST, ALT, ALKPHOS, BILITOT, PROT, ALBUMIN in the last 72 hours. No results for input(s): LIPASE, AMYLASE in the last 72 hours. Cardiac Enzymes No results for input(s): CKTOTAL, CKMB, CKMBINDEX, TROPONINI in the last 72 hours.  BNP: BNP (last 3 results)  Recent Labs  11/02/16 0841 11/05/16 1133 11/05/16 1414  BNP 729.6* 1,034.7* 1,019.5*    ProBNP (last 3 results) No results for input(s): PROBNP in the last 8760 hours.   D-Dimer No results for input(s): DDIMER in the last 72 hours. Hemoglobin A1C  Recent Labs  11/10/16  0855  HGBA1C 6.3*   Fasting Lipid Panel  Recent Labs  11/10/16 0855  CHOL 168  HDL 60  LDLCALC 90  TRIG 88  CHOLHDL 2.8   Thyroid Function Tests No results for input(s): TSH, T4TOTAL, T3FREE, THYROIDAB in the last 72 hours.  Invalid input(s): FREET3  Other results:   Imaging    No results found.   Medications:     Scheduled Medications: . atorvastatin  40 mg Oral q1800  . chlorhexidine  15 mL Mouth Rinse BID  . heparin  5,000 Units Subcutaneous Q8H  . mouth rinse  15 mL Mouth Rinse q12n4p  . sodium chloride flush  3 mL Intravenous Q12H  . torsemide  40 mg Oral BID    Infusions: . sodium chloride      PRN Medications: sodium chloride, acetaminophen, hydrALAZINE, HYDROcodone-acetaminophen, ondansetron (ZOFRAN) IV, ondansetron **OR** [DISCONTINUED] ondansetron (ZOFRAN) IV, polyethylene glycol, sodium chloride, sodium chloride flush    Patient Profile   Hunter [name] is a 46 y.o.  male history of sarcoidosis, chronic hypoxemic respiratory failure, and focal segmental glomerulosclerosis with nephrotic syndrome.  He was admitted with acute on chronic respiratory failure and volume overload.    Assessment/Plan   1. Acute on chronic hypoxemic/hypercarbic respiratory failure - On home 02.  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 Repeat pending.  - Concern for OSH/OSA component as well.  - Continue NRBM/Bipap as needed for now to keep 02 sat in 88-92% range. 2. RV failure with suspected pulmonary hypertension - RV dilated/dysfunction on echo with D-shaped interventricular septum - Volume status improved. Transitioned to po meds. - Continue torsemide 40 mg BID for now. Follow creatinine.  - RHC 11/11/16 with optimized filling pressures.  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  - High res CT pending for quantification of sarcoidosis involvement 3. Focal segmental glomerulosclerosis with nephrotic syndrome - Improve from admit. Mild bump yesterday from 1.1 -> 1.3. Continue to follow closely.  4. Hypomagnesemia - Will give 4 g 5. Hypokalemia - Will supp.  6. Social - He has no insurance. Will consult CSW to involve financial planning.   Length of Stay: [address]  Graciella Freer, [place]  11/12/2016, 7:40 AM  Advanced Heart Failure Team Pager [phone] (M-F; 7a - 4p)  Please contact [facility] for night-coverage after hours (4p -7a ) and weekends on [email or web address]  1. Acute on chronic hypoxemic/hypercarbic respiratory failure: He is on home oxygen at baseline. Looking at CT from 2016, he appears to have significant pulmonary sarcoidosis. It is possible that this is the primary respiratory problem. However, also some concern for OSH/OSA (never had sleep study). Pulmonary edema this admission may contribute.  - Pending read on high resolution CT chest.  - Oxygen weaned successfully to 2L [place].  - Will need pulmonary followup as  outpatient for sarcoidosis.  2. 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 After diuresis, filling pressures looked good on RHC yesterday.  On RHC, he had mild pulmonary hypertension with PVR 3.6 WU.  Possible component of group 5 PAH (sarcoidosis) versus group 3 from OHS/OSA.  With PVR not particularly high and suspected severe parenchymal disease from sarcoidosis, suspect that there is not going to be much benefit from treatment with selective pulmonary vasodilators.  - Continue torsemide 40 mg po bid, probably will be his home regimen. Replace K.  - Await results from CT chest.  - Continue  oxygen to limit hypoxic pulmonary vasoconstriction.  3. Focal segmental glomerulosclerosis with nephrotic syndrome: Patient has diuresed well, looks near-euvolemic. - Renal has been following.  4. 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  Will have social worker see him today.   Marca Ancona 11/12/2016 10:10 AM

## 2016-11-13 LAB — BASIC METABOLIC PANEL
BUN: 40 mg/dL — AB (ref 6–20)
CHLORIDE: 76 mmol/L — AB (ref 101–111)
Calcium: 8.6 mg/dL — ABNORMAL LOW (ref 8.9–10.3)
Creatinine, Ser: 1.11 mg/dL (ref 0.61–1.24)
GFR calc Af Amer: >60 mL/min (ref >60)
GLUCOSE: 96 mg/dL (ref 65–99)
POTASSIUM: 4.1 mmol/L (ref 3.5–5.1)
Sodium: 138 mmol/L (ref 135–145)

## 2016-11-13 LAB — MAGNESIUM: MAGNESIUM: 2.1 mg/dL (ref 1.7–2.4)

## 2016-11-13 MED ORDER — LORATADINE 10 MG PO TABS
10.0000 mg | ORAL_TABLET | Freq: Every day | ORAL | Status: DC
  Administered 2016-11-13 – 2016-11-17 (×5): 10 mg via ORAL
  Filled 2016-11-13 (×5): qty 1

## 2016-11-13 NOTE — Progress Notes (Signed)
Progress Note    Hunter [name]  ZOX:[id number] DOB: Aug 19, 1970  DOA: 11/05/2016 PCP: Bing Neighbors, FNP    Brief Narrative:   Chief complaint: F/U right sided hear failure  Medical records reviewed and are as summarized below:  Hunter [name] is an 46 y.o. male with a PMH of Sarcoidosis, Chronic Respiratory Failure with Hypoxia (on home O2 noncompliant), HTN, FSGS, CKD stage III. Tobacco abuse, OSA, Obesity Hypoventilation syndrome, homelessness who was admitted 11/05/16 for evaluation of worsening shortness of breath and lower extremity edema. It appears his PCP recently changed his diuretics.  Assessment/Plan:   Principle problems: Acute RIGHT HEART failure/Chronic Diastolic CHF Multifactorial noncompliance with medication, noncompliance with nutrition, sarcoidosis, OSA/OHS untreated. 11/11/16 S/P RIGHT HEART catheterization. Cardiology to arrange for HRCT chest. Has diuresed well. Strict in and out since admission: 28.5 L, weight declining. Continue Torsemide and Hydralazine.Okay to transfer to telemetry.  Acute on Chronic Respiratory Failure with Hypoxia and Hypercapnia Noncompliant with home O2. Per patient on 2-3 L O2 via [place]. Unable to tolerate Bipap and likely will not be compliant. Will require sleep study as outpatient. Has not been consistently using his home oxygen due to residing at a homeless shelter. Poor healthcare literacy despite multiple providers educating him.  Loculated RIGHT Pleural Effusion PCXR 11/08/16: partially loculated right pleural effusion. Repeat PCXR on 11/11/16--> Effusion was the same or worsened will need to consider IR for drainage. Do not believe patient will be candidate for VATS. No evidence of pneumonia clinically.  Active problems: Hypokalemia Replete.  Acute on CKD stageIII with nephropathy (baseline Cr 0.92 in 09/13/2016)  Biopsy results 2017: Focal and segmental glomerulosclerosis+ early diabetic glomerulopathy mild arteriosclerosis  with mild to moderate tubulointerstitial scarring. Significantly improved with diuresis. Nephrology following.  Pre-Diabetes Per kidney biopsy early diabetic glomerulopathy. Hgb A1c 6.3%. Patient meets criteria for prediabetes. Per Diabetic Nutritionist note patient uninterested in diabetic education.   HLD Continue Lipitor 40 mg daily.    Sarcoidosis of lung Has followed with pulmonology in the past, now off prednisone. High resolution CT done 11/12/16 and shows findings consistent with sarcoidosis but stable in appearance when compared to prior films.  Morbid obesity Body mass index is 41.09 kg/m.  Homelessness Social worker consulted; patient would like to apply for disability, discuss payment plan options for hospitalization.  OSA/OHS Although no official sleep study patient clearly has signs and symptoms consistent with OSA/OHS to include hypercarbia. Arrange outpatient sleep study. BiPAP QHS while in hospital due to risk of hypercarbia (refusing, will try a different mask). Consulted CM for Trilogy for home use however lack of financial resources will prove challenging.  Left ankle gout Uric acid 11.9. Continue prednisone.  Hypomagnesemia Supplemented.   Family Communication/Anticipated D/C date and plan/Code Status   DVT prophylaxis: Heparin ordered. Code Status: Full Code.  Family Communication: No family present at bedside. Disposition Plan: Homeless, CM consulted.   Medical Consultants:    Nephrology  Cardiology  PCCM   Anti-Infectives:    None  Subjective:   Continues to complain that the CPAP machine is too uncomfortable for him to tolerate. Asks for different mask. No significant dyspnea with rest. No reports of chest pain.  Objective:    Vitals:   11/12/16 2040 11/13/16 0012 11/13/16 0448 11/13/16 0737  BP: 107/89 121/84 100/80 106/78  Pulse: 82   74  Resp: Temp: 97.8 F (36.6 C)   98 F (36.7 C)  TempSrc: Oral  Oral  SpO2: 93% 99% 93% 99%  Weight:   115.2 kg (254 lb)   Height:        Intake/Output Summary (Last 24 hours) at 11/13/16 0853 Last data filed at 11/13/16 0556  Gross per 24 hour  Intake              720 ml  Output             4950 ml  Net            -4230 ml   Filed Weights   11/11/16 0611 11/12/16 0600 11/13/16 0448  Weight: 119.7 kg (264 lb) 117 kg (258 lb) 115.2 kg (254 lb)    Exam: General: No acute distress. Remains with poor understanding of medical condition/treatment. Cardiovascular: Heart sounds show a regular rate, and rhythm. No gallops or rubs. No murmurs. No JVD. Lungs: Clear to auscultation bilaterally with good air movement. No rales, rhonchi or wheezes. Abdomen: Soft, nontender, nondistended with normal active bowel sounds. No masses. No hepatosplenomegaly. Skin: Warm and dry. No rashes or lesions. Extremities: No clubbing or cyanosis. No edema. Pedal pulses 2+.   Data Reviewed:   I have personally reviewed following labs and imaging studies:  Labs: Labs show the following:   Basic Metabolic Panel:  Recent Labs Lab 11/09/16 0330 11/10/16 1030 11/11/16 0503 11/11/16 1408 11/12/16 0415 11/13/16 0442  NA 141 141 143  --  139 138  K 4.9 3.8 3.9  --  3.4* 4.1  CL 98* 91* 86*  --  76* 76*  CO2 34* 44* 43*  --  >50* >50*  GLUCOSE 108* 134* 70  --  124* 96  BUN 44* 41* 37*  --  43* 40*  CREATININE 1.24 1.26* 1.11 1.09 1.32* 1.11  CALCIUM 8.2* 8.6* 8.7*  --  8.4* 8.6*  MG 1.8 1.4* 1.3*  --  1.7 2.1   GFR Estimated Creatinine Clearance: 106.6 mL/min (by C-G formula based on SCr of 1.11 mg/dL).  Coagulation profile  Recent Labs Lab 11/11/16 0836  INR 0.89    CBC:  Recent Labs Lab 11/07/16 0344 11/08/16 0327 11/09/16 0330 11/11/16 0503 11/11/16 1408  WBC 4.9 6.4 5.8 4.8 4.4  NEUTROABS 3.2 4.2 4.6 2.6  --   HGB 14.2 14.8 15.6 15.6 16.1  HCT 50.3 51.6 51.2 53.5* 55.8*  MCV 87.0 85.9 83.5 84.9 86.9  PLT 146* 132* 135* 120* 127*    Hgb A1c:  Recent Labs  11/10/16 0855  HGBA1C 6.3*   Lipid Profile:  Recent Labs  11/10/16 0855  CHOL 168  HDL 60  LDLCALC 90  TRIG 88  CHOLHDL 2.8   Microbiology  MRSA swab negative 2  Procedures and diagnostic studies:  Ct Chest High Resolution  Result Date: 11/12/2016 CLINICAL DATA:  46 year old male with history of sarcoidosis. No current chest pain or shortness of breath. Low oxygen saturation. EXAM: CT CHEST WITHOUT CONTRAST TECHNIQUE: Multidetector CT imaging of the chest was performed following the standard protocol without intravenous contrast. High resolution imaging of the lungs, as well as inspiratory and expiratory imaging, was performed. COMPARISON:  Chest CT 11/25/2014. FINDINGS: Cardiovascular: Heart size is mildly enlarged. There is no significant pericardial fluid, thickening or pericardial calcification. No atherosclerotic calcifications are noted in the thoracic aorta or the coronary arteries. Markedly dilated pulmonic trunk (4.4 cm in diameter) and central pulmonary arteries, concerning for pulmonary arterial hypertension. Mediastinum/Nodes: No pathologically enlarged mediastinal or hilar lymph nodes. Please note that accurate exclusion of  hilar adenopathy is limited on noncontrast CT scans. Lungs/Pleura: Chronic right-sided pleural thickening calcification, similar to the prior study. Trace amount of right-sided pleural fluid. Chronic areas of pleuroparenchymal architectural distortion and linear scarring throughout the periphery of the right lung, and to a lesser extent in the periphery of the left upper lobe. No acute consolidative airspace disease. No suspicious appearing pulmonary nodules or masses. High-resolution images demonstrate no significant regions of ground-glass attenuation, bronchiectasis or honeycombing. Inspiratory and expiratory imaging is limited by patient motion, but is generally unremarkable. Upper Abdomen: Multiple partially calcified  borderline enlarged and mildly enlarged lymph nodes in the upper abdomen, similar to the prior examinations, presumably related to underlying sarcoidosis. Musculoskeletal: There are no aggressive appearing lytic or blastic lesions noted in the visualized portions of the skeleton. IMPRESSION: 1. Overall, the appearance of the chest appears very similar to prior study 11/25/2014, and given the patient's history these findings are likely reflective of chronic sarcoidosis. No new acute findings are noted. 2. Severe dilatation of the pulmonic trunk and main pulmonary arteries, suggestive of underlying pulmonary arterial hypertension. 3. Cardiomegaly. 4. Additional incidental findings, as above. Electronically Signed   By: Trudie Reed M.D.   On: 11/12/2016 10:55   I personally reviewed the films and agree with the above reading. Dilated pulmonary trunk is pictured on the left.    Medications:   . atorvastatin  40 mg Oral q1800  . chlorhexidine  15 mL Mouth Rinse BID  . heparin  5,000 Units Subcutaneous Q8H  . mouth rinse  15 mL Mouth Rinse q12n4p  . sodium chloride flush  3 mL Intravenous Q12H  . torsemide  40 mg Oral BID   Continuous Infusions: . sodium chloride       LOS: 8 days   Aqueelah Cotrell  [facility] Pager [phone]. If unable to reach me by pager, please call my cell phone at [phone].  *Please refer to [email or web address], password [email or web address] to get updated schedule on who will round on this patient, as hospitalists switch teams weekly. If 7PM-7AM, please contact night-coverage at [email or web address], password [email or web address] for any overnight needs.  11/13/2016, 8:53 AM

## 2016-11-13 NOTE — Progress Notes (Signed)
Pt c/o not being able to tolerate CPAP mask at night d/t nasal drainage. MD notified. Order received for anti histamine. Also, c/o of difficulty breathing with mask fitting and settings of machine. RT notified. RT to come evaluate for any additional solutions to enable pt to tolerate CPAP at night. Pt made aware of interventions. Verbalizes understanding.

## 2016-11-13 NOTE — Progress Notes (Signed)
Progress Note  Patient Name: Hunter [name] Date of Encounter: 11/13/2016  Primary Cardiologist:   Dr. Donnie Aho  Subjective   Breathing Ok on O2.  Ambulated slightly  Inpatient Medications    Scheduled Meds: . atorvastatin  40 mg Oral q1800  . chlorhexidine  15 mL Mouth Rinse BID  . heparin  5,000 Units Subcutaneous Q8H  . mouth rinse  15 mL Mouth Rinse q12n4p  . sodium chloride flush  3 mL Intravenous Q12H  . torsemide  40 mg Oral BID   Continuous Infusions: . sodium chloride     PRN Meds: sodium chloride, acetaminophen, hydrALAZINE, HYDROcodone-acetaminophen, ondansetron (ZOFRAN) IV, ondansetron **OR** [DISCONTINUED] ondansetron (ZOFRAN) IV, polyethylene glycol, sodium chloride, sodium chloride flush   Vital Signs    Vitals:   11/12/16 2040 11/13/16 0012 11/13/16 0448 11/13/16 0737  BP: 107/89 121/84 100/80 106/78  Pulse: 82   74  Resp: Temp: 97.8 F (36.6 C)   98 F (36.7 C)  TempSrc: Oral   Oral  SpO2: 93% 99% 93% 99%  Weight:   254 lb (115.2 kg)   Height:        Intake/Output Summary (Last 24 hours) at 11/13/16 0955 Last data filed at 11/13/16 0900  Gross per 24 hour  Intake              960 ml  Output             5225 ml  Net            -4265 ml   Filed Weights   11/11/16 0611 11/12/16 0600 11/13/16 0448  Weight: 264 lb (119.7 kg) 258 lb (117 kg) 254 lb (115.2 kg)    Telemetry    NSR - Personally Reviewed  ECG    NA - Personally Reviewed  Physical Exam   GEN: No acute distress.   Neck: Positive  JVD Cardiac: RRR, no murmurs, rubs, or gallops.  Respiratory: Clear  to auscultation bilaterally. GI: Soft, nontender, non-distended  MS: Moderate edema; No deformity. Neuro:  Nonfocal  Psych: Normal affect   Labs    Chemistry Recent Labs Lab 11/11/16 0503 11/11/16 1408 11/12/16 0415 11/13/16 0442  NA 143  --  139 138  K 3.9  --  3.4* 4.1  CL 86*  --  76* 76*  CO2 43*  --  >50* >50*  GLUCOSE 70  --  124* 96  BUN 37*  --   43* 40*  CREATININE 1.11 1.09 1.32* 1.11  CALCIUM 8.7*  --  8.4* 8.6*  GFRNONAA >60 >60 >60 >60  GFRAA >60 >60 >60 >60  ANIONGAP 14  --  NOT CALCULATED NOT CALCULATED     Hematology Recent Labs Lab 11/09/16 0330 11/11/16 0503 11/11/16 1408  WBC 5.8 4.8 4.4  RBC 6.13* 6.30* 6.42*  HGB 15.6 15.6 16.1  HCT 51.2 53.5* 55.8*  MCV 83.5 84.9 86.9  MCH 25.4* 24.8* 25.1*  MCHC 30.5 29.2* 28.9*  RDW 19.1* 18.2* 18.4*  PLT 135* 120* 127*    Cardiac EnzymesNo results for input(s): TROPONINI in the last 168 hours. No results for input(s): TROPIPOC in the last 168 hours.   BNPNo results for input(s): BNP, PROBNP in the last 168 hours.   DDimer No results for input(s): DDIMER in the last 168 hours.   Radiology    Ct Chest High Resolution  Result Date: 11/12/2016 CLINICAL DATA:  46 year old male with history of sarcoidosis. No current chest pain  or shortness of breath. Low oxygen saturation. EXAM: CT CHEST WITHOUT CONTRAST TECHNIQUE: Multidetector CT imaging of the chest was performed following the standard protocol without intravenous contrast. High resolution imaging of the lungs, as well as inspiratory and expiratory imaging, was performed. COMPARISON:  Chest CT 11/25/2014. FINDINGS: Cardiovascular: Heart size is mildly enlarged. There is no significant pericardial fluid, thickening or pericardial calcification. No atherosclerotic calcifications are noted in the thoracic aorta or the coronary arteries. Markedly dilated pulmonic trunk (4.4 cm in diameter) and central pulmonary arteries, concerning for pulmonary arterial hypertension. Mediastinum/Nodes: No pathologically enlarged mediastinal or hilar lymph nodes. Please note that accurate exclusion of hilar adenopathy is limited on noncontrast CT scans. Lungs/Pleura: Chronic right-sided pleural thickening calcification, similar to the prior study. Trace amount of right-sided pleural fluid. Chronic areas of pleuroparenchymal architectural  distortion and linear scarring throughout the periphery of the right lung, and to a lesser extent in the periphery of the left upper lobe. No acute consolidative airspace disease. No suspicious appearing pulmonary nodules or masses. High-resolution images demonstrate no significant regions of ground-glass attenuation, bronchiectasis or honeycombing. Inspiratory and expiratory imaging is limited by patient motion, but is generally unremarkable. Upper Abdomen: Multiple partially calcified borderline enlarged and mildly enlarged lymph nodes in the upper abdomen, similar to the prior examinations, presumably related to underlying sarcoidosis. Musculoskeletal: There are no aggressive appearing lytic or blastic lesions noted in the visualized portions of the skeleton. IMPRESSION: 1. Overall, the appearance of the chest appears very similar to prior study 11/25/2014, and given the patient's history these findings are likely reflective of chronic sarcoidosis. No new acute findings are noted. 2. Severe dilatation of the pulmonic trunk and main pulmonary arteries, suggestive of underlying pulmonary arterial hypertension. 3. Cardiomegaly. 4. Additional incidental findings, as above. Electronically Signed   By: Trudie Reed M.D.   On: 11/12/2016 10:55    Cardiac Studies   Echo:  Study Conclusions  - Left ventricle: The cavity size was normal. Systolic function was   normal. The estimated ejection fraction was in the range of 55%   to 60%. Wall motion was normal; there were no regional wall   motion abnormalities. - Ventricular septum: The contour showed systolic flattening. These   changes are consistent with RV pressure overload. - Aortic root: The aortic root was mildly dilated. - Right ventricle: The cavity size was dilated. Systolic function   was moderately reduced. - Right atrium: The atrium was severely dilated. - Atrial septum: The septum bowed from right to left, consistent   with increased right  atrial pressure. - Pulmonary arteries: Systolic pressure could not be accurately   estimated todue to lack of an adequate TR jet. the dilated RV,   septtal flattening and bowing fo the atrial septum to the right   suggests signififcant elevation of RVSP and RAP. PA peak   pressure: 32 mm Hg (S).  Patient Profile     46 y.o. male male history of sarcoidosis, chronic hypoxemic respiratory failure, and focal segmental glomerulosclerosis with nephrotic syndrome. He was admitted with acute on chronic respiratory failure and volume overload.   Assessment & Plan    ACUTE ON CHRONIC RESPIRATORY FAILURE:   Down thirty liters.   Continue PO torsemide.    RV FAILURE:    Transitioned to PO meds.  High res CT chest yesterday with chronic sarcoid and evidence of PAF.  No acute findings.  Continue current therapy.    Signed, Rollene Rotunda, MD  11/13/2016, 9:55 AM

## 2016-11-13 NOTE — Progress Notes (Signed)
NCM spoke with Charge RN and patient,  Patient did not wear the mask for the triology  Vent last pm, he states he felt like he could not breathe with the mask on.  Charge RN will speak with respiratory to see if he can get a different mask to help him feel more comfortable.  NCM informed patient that if he will not be wearing mask that he will need to go back to [facility],  And if he will be using the triology vent then he will do the [employer] project.  Patient states he wants to wear the mask but feels uncomfortable.  Patient also states he would like to speak with a financial counselor,  The patient was told yesterday that the financial counselor does not work on the weekend and that they will be in touch with him if he goes to the [place] [facility] ,the NCM will give his cell number to  The financial counselor on Monday and they will be in touch with Nurse Larene Beach and if he goes to the [facility] , the financial counselor will be in touch with the  congregational nurse.  NCM paged Dr. Darnelle Catalan to sign the triology order on the chart.  NCM spoke with Elease Hashimoto , CSW and informed her that patient is to wear the triology tonight and for CSW to check the resp notes.

## 2016-11-13 NOTE — Plan of Care (Signed)
Problem: Health Behavior/Discharge Planning: Goal: Ability to manage health-related needs will improve Outcome: Progressing Encouraged compliance with CPAP. RT asked to assist with mask fitting.

## 2016-11-13 NOTE — Social Work (Signed)
CSW received a call from nurse on floor advising that patient wants to know about the discharge plan for him and he was advised the CSW would meet with him.  CSW called patient's room to discuss his needs. CSW was advised that he would be going to [facility] per notes from [place] this week. Pt indicated that no one met with him to discuss the program or his plan after the program.  CSW advised would f/u as CSW was not involved in the transition to the [facility]. Pt not pleased and felt that CSW should be aware of the discharge plan. CSW validated his concerns.  Pt then begin to ask about his medical limitations and applying for ssdi. CSW advised that the clinical would be best to discuss his physical health as they will know about his long term prognosis. CSW explained the role of CSW's in the hospital as we are more assisting with providing resources for community needs and discharging to facility for placement.  Pt not pleased and appeared to understand. CSW will f/u on more information about the [facility] to put patient at ease and validate his concerns.  Elissa Hefty, LCSW Clinical Social Worker [phone]

## 2016-11-13 NOTE — Plan of Care (Signed)
Problem: Physical Regulation: Goal: Ability to maintain clinical measurements within normal limits will improve Outcome: Progressing VSS throughout shift. Remains on 2-3L [place].Denies any SOB. Still feels fatigued. Refused to where bipap over night, pt educated. Will continue to monitor.

## 2016-11-14 DIAGNOSIS — I951 Orthostatic hypotension: Secondary | ICD-10-CM

## 2016-11-14 LAB — BASIC METABOLIC PANEL
ANION GAP: 11 (ref 5–15)
BUN: 49 mg/dL — ABNORMAL HIGH (ref 6–20)
CALCIUM: 8.3 mg/dL — AB (ref 8.9–10.3)
CO2: 47 mmol/L — ABNORMAL HIGH (ref 22–32)
Chloride: 77 mmol/L — ABNORMAL LOW (ref 101–111)
Creatinine, Ser: 1.4 mg/dL — ABNORMAL HIGH (ref 0.61–1.24)
GFR, EST NON AFRICAN AMERICAN: 58 mL/min — AB (ref >60)
Glucose, Bld: 96 mg/dL (ref 65–99)
POTASSIUM: 4.3 mmol/L (ref 3.5–5.1)
SODIUM: 135 mmol/L (ref 135–145)

## 2016-11-14 MED ORDER — TORSEMIDE 20 MG PO TABS: 40.0000 mg | ORAL_TABLET | Freq: Every day | ORAL | Status: DC

## 2016-11-14 NOTE — Progress Notes (Signed)
Progress Note    Hunter [name]  ZOX:[id number] DOB: 1970-07-22  DOA: 11/05/2016 PCP: Bing Neighbors, FNP    Brief Narrative:   Chief complaint: F/U right sided hear failure  Medical records reviewed and are as summarized below:  Hunter [name] is an 46 y.o. male with a PMH of Sarcoidosis, Chronic Respiratory Failure with Hypoxia (on home O2 noncompliant), HTN, FSGS, CKD stage III. Tobacco abuse, OSA, Obesity Hypoventilation syndrome, homelessness who was admitted 11/05/16 for evaluation of worsening shortness of breath and lower extremity edema. It appears his PCP recently changed his diuretics.  Assessment/Plan:   Principle problems: Acute RIGHT HEART failure/Chronic Diastolic CHF /Orthostatic hypotension Multifactorial noncompliance with medication, noncompliance with nutrition, sarcoidosis, OSA/OHS untreated. 11/11/16 S/P RIGHT HEART catheterization. Has diuresed well. Strict in and out since admission: Proximally 30 L out, weight declining. Continue Torsemide and Hydralazine. Dose adjusted by cardiology. Had an episode of significant/symptomatic orthostatic hypotension with blood pressure dropping into the 70s/40s, and becoming extremely dizzy.  Acute on Chronic Respiratory Failure with Hypoxia and Hypercapnia Noncompliant with home O2. Per patient on 2-3 L O2 via [place]. Unable to tolerate Bipap and likely will not be compliant. Will require sleep study as outpatient. Has not been consistently using his home oxygen due to residing at a homeless shelter. Poor healthcare literacy despite multiple providers educating him.  Loculated RIGHT Pleural Effusion PCXR 11/08/16: partially loculated right pleural effusion. Repeat PCXR on 11/11/16--> persistent effusion. Only had trace right-sided pleural fluid on high resolution CT. No evidence of pneumonia clinically or on CT.  Active problems: Hypokalemia Repleted.  Acute on CKD stageIII with nephropathy (baseline Cr 0.92 in 09/13/2016)    Biopsy results 2017: Focal and segmental glomerulosclerosis+ early diabetic glomerulopathy mild arteriosclerosis with mild to moderate tubulointerstitial scarring. Significantly improved with diuresis. Bump in creatinine noted over the past 24 hours, likely from over diuresis.  Pre-Diabetes Per kidney biopsy early diabetic glomerulopathy. Hgb A1c 6.3%. Patient meets criteria for prediabetes. Per Diabetic Nutritionist note patient uninterested in diabetic education.   HLD Continue Lipitor 40 mg daily.    Sarcoidosis of lung Has followed with pulmonology in the past, now off prednisone. High resolution CT done 11/12/16 and shows findings consistent with sarcoidosis but stable in appearance when compared to prior films.  Morbid obesity Body mass index is 41.09 kg/m.  Homelessness Social worker consulted; patient would like to apply for disability, discuss payment plan options for hospitalization.  OSA/OHS Although no official sleep study patient clearly has signs and symptoms consistent with OSA/OHS to include hypercarbia. Will need to have outpatient sleep study. Although he has been ordered BiPAP QHS while in hospital due to risk of hypercarbia (refusing, will try a different mask), he hasn't yet to be compliant with this. Consulted CM for Trilogy for home use however lack of financial resources will prove challenging.  Left ankle gout Uric acid 11.9. Status post treatment with prednisone.  Hypomagnesemia Supplemented.   Family Communication/Anticipated D/C date and plan/Code Status   DVT prophylaxis: Heparin ordered. Code Status: Full Code.  Family Communication: No family present at bedside. Disposition Plan: Homeless, CM consulted.   Medical Consultants:    Nephrology  Cardiology  PCCM   Anti-Infectives:    None  Subjective:   Patient was attempting to get up with PT when I was in the room with him, and he became very dizzy. He was quite anxious at the  time. Blood pressure noted to be 73/43. Also becomes hypoxic without oxygen.  Objective:    Vitals:   11/13/16 1600 11/13/16 2130 11/14/16 0000 11/14/16 0400  BP: 100/65 (!) 115/95 100/73 (!) 83/72  Pulse: 85     Resp: 17     Temp: 98.4 F (36.9 C) 97.7 F (36.5 C) (!) 97.5 F (36.4 C) 98 F (36.7 C)  TempSrc: Oral Oral Oral Oral  SpO2: 98%   95%  Weight:      Height:        Intake/Output Summary (Last 24 hours) at 11/14/16 0817 Last data filed at 11/14/16 0400  Gross per 24 hour  Intake              483 ml  Output             1500 ml  Net            -1017 ml   Filed Weights   11/11/16 0611 11/12/16 0600 11/13/16 0448  Weight: 119.7 kg (264 lb) 117 kg (258 lb) 115.2 kg (254 lb)    Exam: General: Very anxious. Cardiovascular: Heart sounds show a regular rate, and rhythm. No gallops or rubs. No murmurs. No JVD. Lungs: Diminished with fair air movement. No rales, rhonchi or wheezes. Abdomen: Soft, nontender, nondistended with normal active bowel sounds. No masses. No hepatosplenomegaly. Skin: Warm and dry. No rashes or lesions. Extremities: No clubbing or cyanosis. No edema. Pedal pulses 2+.   Data Reviewed:   I have personally reviewed following labs and imaging studies:  Labs: Labs show the following:   Basic Metabolic Panel:  Recent Labs Lab 11/09/16 0330 11/10/16 1030 11/11/16 0503 11/11/16 1408 11/12/16 0415 11/13/16 0442 11/14/16 0420  NA 141 141 143  --  139 138 135  K 4.9 3.8 3.9  --  3.4* 4.1 4.3  CL 98* 91* 86*  --  76* 76* 77*  CO2 34* 44* 43*  --  >50* >50* 47*  GLUCOSE 108* 134* 70  --  124* 96 96  BUN 44* 41* 37*  --  43* 40* 49*  CREATININE 1.24 1.26* 1.11 1.09 1.32* 1.11 1.40*  CALCIUM 8.2* 8.6* 8.7*  --  8.4* 8.6* 8.3*  MG 1.8 1.4* 1.3*  --  1.7 2.1  --    GFR Estimated Creatinine Clearance: 84.5 mL/min (A) (by C-G formula based on SCr of 1.4 mg/dL (H)).  Coagulation profile  Recent Labs Lab 11/11/16 0836  INR 0.89     CBC:  Recent Labs Lab 11/08/16 0327 11/09/16 0330 11/11/16 0503 11/11/16 1408  WBC 6.4 5.8 4.8 4.4  NEUTROABS 4.2 4.6 2.6  --   HGB 14.8 15.6 15.6 16.1  HCT 51.6 51.2 53.5* 55.8*  MCV 85.9 83.5 84.9 86.9  PLT 132* 135* 120* 127*   Hgb A1c: No results for input(s): HGBA1C in the last 72 hours. Lipid Profile: No results for input(s): CHOL, HDL, LDLCALC, TRIG, CHOLHDL, LDLDIRECT in the last 72 hours. Microbiology  MRSA swab negative 2  Procedures and diagnostic studies:  Ct Chest High Resolution  Result Date: 11/12/2016 CLINICAL DATA:  46 year old male with history of sarcoidosis. No current chest pain or shortness of breath. Low oxygen saturation. EXAM: CT CHEST WITHOUT CONTRAST TECHNIQUE: Multidetector CT imaging of the chest was performed following the standard protocol without intravenous contrast. High resolution imaging of the lungs, as well as inspiratory and expiratory imaging, was performed. COMPARISON:  Chest CT 11/25/2014. FINDINGS: Cardiovascular: Heart size is mildly enlarged. There is no significant pericardial fluid, thickening or pericardial calcification. No atherosclerotic  calcifications are noted in the thoracic aorta or the coronary arteries. Markedly dilated pulmonic trunk (4.4 cm in diameter) and central pulmonary arteries, concerning for pulmonary arterial hypertension. Mediastinum/Nodes: No pathologically enlarged mediastinal or hilar lymph nodes. Please note that accurate exclusion of hilar adenopathy is limited on noncontrast CT scans. Lungs/Pleura: Chronic right-sided pleural thickening calcification, similar to the prior study. Trace amount of right-sided pleural fluid. Chronic areas of pleuroparenchymal architectural distortion and linear scarring throughout the periphery of the right lung, and to a lesser extent in the periphery of the left upper lobe. No acute consolidative airspace disease. No suspicious appearing pulmonary nodules or masses.  High-resolution images demonstrate no significant regions of ground-glass attenuation, bronchiectasis or honeycombing. Inspiratory and expiratory imaging is limited by patient motion, but is generally unremarkable. Upper Abdomen: Multiple partially calcified borderline enlarged and mildly enlarged lymph nodes in the upper abdomen, similar to the prior examinations, presumably related to underlying sarcoidosis. Musculoskeletal: There are no aggressive appearing lytic or blastic lesions noted in the visualized portions of the skeleton. IMPRESSION: 1. Overall, the appearance of the chest appears very similar to prior study 11/25/2014, and given the patient's history these findings are likely reflective of chronic sarcoidosis. No new acute findings are noted. 2. Severe dilatation of the pulmonic trunk and main pulmonary arteries, suggestive of underlying pulmonary arterial hypertension. 3. Cardiomegaly. 4. Additional incidental findings, as above. Electronically Signed   By: Trudie Reed M.D.   On: 11/12/2016 10:55   I personally reviewed the films and agree with the above reading. Dilated pulmonary trunk is pictured on the left.    Medications:   . atorvastatin  40 mg Oral q1800  . chlorhexidine  15 mL Mouth Rinse BID  . heparin  5,000 Units Subcutaneous Q8H  . loratadine  10 mg Oral Daily  . mouth rinse  15 mL Mouth Rinse q12n4p  . sodium chloride flush  3 mL Intravenous Q12H  . torsemide  40 mg Oral BID   Continuous Infusions: . sodium chloride       LOS: 9 days   Cambrie Sonnenfeld  [facility] Pager [phone]. If unable to reach me by pager, please call my cell phone at [phone].  *Please refer to [email or web address], password [email or web address] to get updated schedule on who will round on this patient, as hospitalists switch teams weekly. If 7PM-7AM, please contact night-coverage at [email or web address], password [email or web address] for any overnight needs.  11/14/2016, 8:17 AM

## 2016-11-14 NOTE — Progress Notes (Signed)
Pt educated on use of call bell for assistance with lines (monitoring, O2) before getting out of bed. Pt not compliant, getting up without his walker. Verbalized that he will get up later to use the bathroom as well, stating "it's fine." Pt asked again to please use the call bell before getting up. Will continue to educate pt and use bed alarm, frequent rounding.

## 2016-11-14 NOTE — Evaluation (Signed)
Physical Therapy Evaluation Patient Details Name: Hunter [name] MRN: [id number] DOB: 1970-12-04 Today's Date: 11/14/2016   History of Present Illness  46 y.o. male male history of sarcoidosis, chronic hypoxemic respiratory failure, and focal segmental glomerulosclerosis with nephrotic syndrome.  He was admitted with acute on chronic respiratory failure and volume overload.    Clinical Impression   Pt admitted with above diagnosis. Pt currently with functional limitations due to the deficits listed below (see PT Problem List). Hunter [name] presents to PT with decr activity tolerance and decr functional mobility, very likely orthostatic as he had symptoms of dizziness standing concurrent with a drop in BP to 82/42; Session conducted on Room Air, and noted O2 sats decr to 86% at one point -- incr back to above 90% with focused breathing;  Pt will benefit from skilled PT to increase their independence and safety with mobility to allow discharge to the venue listed below.       Follow Up Recommendations Home health PT;Other (comment) [facility] for chronic disease management)    Equipment Recommendations  Other (comment) (to be determined) Will need more info re: assist and support he will have at [facility]   Recommendations for Other Services OT consult     Precautions / Restrictions Precautions Precautions: Fall Precaution Comments: Is likely orthostatic      Mobility  Bed Mobility Overal bed mobility: Needs Assistance Bed Mobility: Supine to Sit     Supine to sit: Supervision     General bed mobility comments: Used rails; supervision for safety  Transfers Overall transfer level: Needs assistance Equipment used: 1 person hand held assist Transfers: Sit to/from Stand Sit to Stand: Min guard         General transfer comment: [place] for safety; cues to self-monitor for activity tolerance  Ambulation/Gait Ambulation/Gait assistance: Min guard Ambulation Distance (Feet): 2  Feet Assistive device: None       General Gait Details: Took a few steps and then indicated he was quite dizzy; standing BP low, so stopped walking and sat down  Stairs            Wheelchair Mobility    Modified Rankin (Stroke Patients Only)       Balance Overall balance assessment: Needs assistance           Standing balance-Leahy Scale: Poor Standing balance comment: Pt tended to hold on to furniture for external support; also with decr BP with standing                             Pertinent Vitals/Pain Pain Assessment: No/denies pain    Home Living Family/patient expects to be discharged to:: Unsure Living Arrangements: Alone               Additional Comments: Per chart review, pt is experiencing homelessness, and plans are for pt to discharge to [facility] or [employer]; are these two the same?  Did not take much time to ask about dc plan on PT eval    Prior Function Level of Independence: Independent               Hand Dominance        Extremity/Trunk Assessment   Upper Extremity Assessment Upper Extremity Assessment: Overall WFL for tasks assessed    Lower Extremity Assessment Lower Extremity Assessment: Generalized weakness       Communication   Communication: No difficulties  Cognition Arousal/Alertness: Awake/alert Behavior During Therapy:  WFL for tasks assessed/performed;Restless Overall Cognitive Status: Within Functional Limits for tasks assessed                                 General Comments: Pt at one point in session asking if he is goign to die; I responded to him that many people live full lives with CHF      General Comments General comments (skin integrity, edema, etc.): Hunter [name] arrived in room during econd half of session; she is aware of low standing BP    Exercises     Assessment/Plan    PT Assessment Patient needs continued PT services  PT Problem List Decreased  strength;Decreased activity tolerance;Decreased balance;Decreased mobility;Decreased knowledge of use of DME;Decreased safety awareness;Decreased knowledge of precautions;Cardiopulmonary status limiting activity       PT Treatment Interventions DME instruction;Gait training;Stair training;Functional mobility training;Therapeutic activities;Therapeutic exercise;Balance training;Patient/family education    PT Goals (Current goals can be found in the Care Plan section)  Acute Rehab PT Goals Patient Stated Goal: wants to be able to stand and walk PT Goal Formulation: With patient Time For Goal Achievement: 11/28/16 Potential to Achieve Goals: Good    Frequency Min 3X/week   Barriers to discharge Other (comment) Experiencing homelessness    Co-evaluation               AM-PAC PT "6 Clicks" Daily Activity  Outcome Measure Difficulty turning over in bed (including adjusting bedclothes, sheets and blankets)?: None Difficulty moving from lying on back to sitting on the side of the bed? : None Difficulty sitting down on and standing up from a chair with arms (e.g., wheelchair, bedside commode, etc,.)?: A Little Help needed moving to and from a bed to chair (including a wheelchair)?: None Help needed walking in hospital room?: A Lot Help needed climbing 3-5 steps with a railing? : A Lot 6 Click Score: 19    End of Session Equipment Utilized During Treatment: Other (comment) (D[place]amap for O2 and BP monitor) Activity Tolerance: Other (comment) (limited by orthostatic hypotension) Patient left: in bed;with call bell/phone within reach;Other (comment) (Sitting EOB, speaking with Hunter [name]) Nurse Communication: Mobility status;Other (comment) (likely orthostatic) PT Visit Diagnosis: Unsteadiness on feet (R26.81);Other abnormalities of gait and mobility (R26.89);Dizziness and giddiness (R42)    Time: 1610-9604 PT Time Calculation (min) (ACUTE ONLY): 27 min   Charges:   PT Evaluation $PT  Eval Moderate Complexity: 1 Mod PT Treatments $Therapeutic Activity: 8-22 mins   PT G Codes:        Hunter [name], PT  Acute Rehabilitation Services Pager [phone] Office [phone]   Hunter [name] 11/14/2016, 4:23 PM

## 2016-11-14 NOTE — Plan of Care (Signed)
Problem: Physical Regulation: Goal: Ability to maintain clinical measurements within normal limits will improve Outcome: Progressing VSS throughout shift. Pt remains on 2-3 L [place]. Tolerating activity well. Continues to refuse Bipap. Per RT, will order Nasal Pillow for pt tp try. Pt appears anxious regarding having mask on. Will continue to educate pt.

## 2016-11-14 NOTE — Progress Notes (Signed)
Pt refuses CPAP for the night. RT will continue to monitor.  

## 2016-11-14 NOTE — Progress Notes (Signed)
RT  To assess pt and place on CPAP, pt refuses all  Of the masks that are available to him. Pt educated of the masks and advised to speak with the doctor on getting nasal prongs for use when discharged. Pt also has high anxiety levels that are interfering with the time that it take s to become adapted to the CPAP machine. PT wont even allow machine to be on for five min before he complains of S/ob and demands to take off mask . Educated pt of the importance of wearing CPAP and pt understands.

## 2016-11-14 NOTE — Clinical Social Work Note (Signed)
Clinical Social Worker continuing to follow patient for support and discharge planning needs.  Patient is set up to begin [facility] project upon discharge.  Per MD, patient not yet medically stable for discharge.  CSW remains available for support and to facilitate patient discharge needs once medically ready.  Macario Golds, [place] [phone]

## 2016-11-14 NOTE — Progress Notes (Signed)
Progress Note  Patient Name: Hunter [name] Date of Encounter: 11/14/2016  Primary Cardiologist:   Dr. Donnie Aho  Subjective   Breathing better.  No acute SOB.  Could not tolerate CPAP last night.  He has been dizzy with ambulation.   Inpatient Medications    Scheduled Meds: . atorvastatin  40 mg Oral q1800  . chlorhexidine  15 mL Mouth Rinse BID  . heparin  5,000 Units Subcutaneous Q8H  . loratadine  10 mg Oral Daily  . mouth rinse  15 mL Mouth Rinse q12n4p  . sodium chloride flush  3 mL Intravenous Q12H  . torsemide  40 mg Oral BID   Continuous Infusions: . sodium chloride     PRN Meds: sodium chloride, acetaminophen, hydrALAZINE, HYDROcodone-acetaminophen, ondansetron (ZOFRAN) IV, polyethylene glycol, sodium chloride, sodium chloride flush   Vital Signs    Vitals:   11/13/16 1600 11/13/16 2130 11/14/16 0000 11/14/16 0400  BP: 100/65 (!) 115/95 100/73 (!) 83/72  Pulse: 85     Resp: 17     Temp: 98.4 F (36.9 C) 97.7 F (36.5 C) (!) 97.5 F (36.4 C) 98 F (36.7 C)  TempSrc: Oral Oral Oral Oral  SpO2: 98%   95%  Weight:      Height:        Intake/Output Summary (Last 24 hours) at 11/14/16 1031 Last data filed at 11/14/16 0400  Gross per 24 hour  Intake              243 ml  Output             1225 ml  Net             -982 ml   Filed Weights   11/11/16 0611 11/12/16 0600 11/13/16 0448  Weight: 264 lb (119.7 kg) 258 lb (117 kg) 254 lb (115.2 kg)    Telemetry    NSR - Personally Reviewed  ECG    NA - Personally Reviewed  Physical Exam   GEN: No acute distress.   Neck: Positive  JVD Cardiac: RRR, no murmurs, rubs, or gallops.  Respiratory: Clear  to auscultation bilaterally. GI: Soft, nontender, non-distended  MS: Moderate edema; No deformity. Neuro:  Nonfocal  Psych: Normal affect   Labs    Chemistry  Recent Labs Lab 11/12/16 0415 11/13/16 0442 11/14/16 0420  NA 139 138 135  K 3.4* 4.1 4.3  CL 76* 76* 77*  CO2 >50* >50* 47*  GLUCOSE  124* 96 96  BUN 43* 40* 49*  CREATININE 1.32* 1.11 1.40*  CALCIUM 8.4* 8.6* 8.3*  GFRNONAA >60 >60 58*  GFRAA >60 >60 >60  ANIONGAP NOT CALCULATED NOT CALCULATED 11     Hematology  Recent Labs Lab 11/09/16 0330 11/11/16 0503 11/11/16 1408  WBC 5.8 4.8 4.4  RBC 6.13* 6.30* 6.42*  HGB 15.6 15.6 16.1  HCT 51.2 53.5* 55.8*  MCV 83.5 84.9 86.9  MCH 25.4* 24.8* 25.1*  MCHC 30.5 29.2* 28.9*  RDW 19.1* 18.2* 18.4*  PLT 135* 120* 127*    Cardiac EnzymesNo results for input(s): TROPONINI in the last 168 hours. No results for input(s): TROPIPOC in the last 168 hours.   BNPNo results for input(s): BNP, PROBNP in the last 168 hours.   DDimer No results for input(s): DDIMER in the last 168 hours.   Radiology    No results found.  Cardiac Studies   Echo:  Study Conclusions  - Left ventricle: The cavity size was normal. Systolic function was  normal. The estimated ejection fraction was in the range of 55%   to 60%. Wall motion was normal; there were no regional wall   motion abnormalities. - Ventricular septum: The contour showed systolic flattening. These   changes are consistent with RV pressure overload. - Aortic root: The aortic root was mildly dilated. - Right ventricle: The cavity size was dilated. Systolic function   was moderately reduced. - Right atrium: The atrium was severely dilated. - Atrial septum: The septum bowed from right to left, consistent   with increased right atrial pressure. - Pulmonary arteries: Systolic pressure could not be accurately   estimated todue to lack of an adequate TR jet. the dilated RV,   septtal flattening and bowing fo the atrial septum to the right   suggests signififcant elevation of RVSP and RAP. PA peak   pressure: 32 mm Hg (S).  Patient Profile     46 y.o. male male history of sarcoidosis, chronic hypoxemic respiratory failure, and focal segmental glomerulosclerosis with nephrotic syndrome. He was admitted with acute on  chronic respiratory failure and volume overload.   Assessment & Plan    ACUTE ON CHRONIC RESPIRATORY FAILURE:   Down 30.7 liters.   Weight is down 47 lbs.  Creat bounces around a little.  Continue PO Torsemide.  Of note he could not tolerate the CPAP and he is somewhat agitated about this.  He will need follow up with a sleep MD and     RV FAILURE:    High res CT chest with chronic sarcoid and evidence of PAF.  No acute findings.  Continue current therapy.  BP running low and he is clearly going to have a BP that is very sensitive to volume.  I will reduce the Torsemide.    Signed, Rollene Rotunda, MD  11/14/2016, 10:31 AM

## 2016-11-15 DIAGNOSIS — R601 Generalized edema: Secondary | ICD-10-CM

## 2016-11-15 DIAGNOSIS — D869 Sarcoidosis, unspecified: Secondary | ICD-10-CM

## 2016-11-15 DIAGNOSIS — I5023 Acute on chronic systolic (congestive) heart failure: Secondary | ICD-10-CM

## 2016-11-15 LAB — BASIC METABOLIC PANEL
Anion gap: 12 (ref 5–15)
BUN: 55 mg/dL — AB (ref 6–20)
CALCIUM: 8.6 mg/dL — AB (ref 8.9–10.3)
CO2: 47 mmol/L — AB (ref 22–32)
CREATININE: 1.55 mg/dL — AB (ref 0.61–1.24)
Chloride: 78 mmol/L — ABNORMAL LOW (ref 101–111)
GFR calc non Af Amer: 51 mL/min — ABNORMAL LOW (ref >60)
GFR, EST AFRICAN AMERICAN: 60 mL/min — AB (ref >60)
Glucose, Bld: 102 mg/dL — ABNORMAL HIGH (ref 65–99)
Potassium: 4.6 mmol/L (ref 3.5–5.1)
SODIUM: 137 mmol/L (ref 135–145)

## 2016-11-15 MED ORDER — ACETAMINOPHEN 325 MG PO TABS: 650.0000 mg | ORAL_TABLET | ORAL | Status: DC | PRN

## 2016-11-15 MED ORDER — TORSEMIDE 20 MG PO TABS
40.0000 mg | ORAL_TABLET | Freq: Every day | ORAL | Status: DC
  Administered 2016-11-16 – 2016-11-17 (×2): 40 mg via ORAL
  Filled 2016-11-15 (×2): qty 2

## 2016-11-15 NOTE — Progress Notes (Signed)
Patient ID: Hunter [name], male   DOB: 07-Feb-1971, 46 y.o.   MRN: [id number]     Advanced Heart Failure Rounding Note  Primary Cardiologist: Shirlee Latch  Subjective:    No complaints today, weight down now 50 lbs total.  He was orthostatic yesterday, not as dizzy today and BP looks ok so far.   High Res CT Chest: Findings consistent with chronic sarcoidosis, similar to 2016.   RHC 11/11/16 Hemodynamics (mmHg) RA mean 6 RV 46/9 PA 49/20, mean 30 PCWP mean 10 Oxygen saturations: PA 66% AO 93% Cardiac Output (Fick) 5.57  Cardiac Index (Fick) 2.32 PVR 3.6 WU  Echo (9/18): EF 55-60%, septal flattening, dilated RV with moderately decreased systolic function, PASP 32 mmHg.    Objective:   Weight Range: 253 lb 1.6 oz (114.8 kg) Body mass index is 34.33 kg/m.   Vital Signs:   Temp:  [97.6 F (36.4 C)-98.7 F (37.1 C)] 98 F (36.7 C) (10/08 0746) Pulse Rate:  [89-96] 96 (10/08 0451) Resp:  [18] 18 (10/08 0746) BP: (104-116)/(63-81) 104/63 (10/08 0746) SpO2:  [93 %-98 %] 97 % (10/08 0746) Weight:  [253 lb 1.6 oz (114.8 kg)] 253 lb 1.6 oz (114.8 kg) (10/08 0451) Last BM Date: 11/13/16  Weight change: Filed Weights   11/12/16 0600 11/13/16 0448 11/15/16 0451  Weight: 258 lb (117 kg) 254 lb (115.2 kg) 253 lb 1.6 oz (114.8 kg)    Intake/Output:   Intake/Output Summary (Last 24 hours) at 11/15/16 0902 Last data filed at 11/15/16 0748  Gross per 24 hour  Intake           353.17 ml  Output             2250 ml  Net         -1896.83 ml      Physical Exam    General: NAD Neck: No JVD, no thyromegaly or thyroid nodule.  Lungs: Decreased BS at bases bilaterally CV: Nondisplaced PMI.  Heart regular S1/S2, no S3/S4, no murmur.  No peripheral edema.   Abdomen: Soft, nontender, no hepatosplenomegaly, no distention.  Skin: Intact without lesions or rashes.  Neurologic: Alert and oriented x 3.  Psych: Normal affect. Extremities: No clubbing or cyanosis.  HEENT: Normal.    Telemetry   NSR, personally reviewed.     EKG    N/A  Labs    CBC No results for input(s): WBC, NEUTROABS, HGB, HCT, MCV, PLT in the last 72 hours. Basic Metabolic Panel  Recent Labs  11/13/16 0442 11/14/16 0420 11/15/16 0336  NA 138 135 137  K 4.1 4.3 4.6  CL 76* 77* 78*  CO2 >50* 47* 47*  GLUCOSE 96 96 102*  BUN 40* 49* 55*  CREATININE 1.11 1.40* 1.55*  CALCIUM 8.6* 8.3* 8.6*  MG 2.1  --   --    Liver Function Tests No results for input(s): AST, ALT, ALKPHOS, BILITOT, PROT, ALBUMIN in the last 72 hours. No results for input(s): LIPASE, AMYLASE in the last 72 hours. Cardiac Enzymes No results for input(s): CKTOTAL, CKMB, CKMBINDEX, TROPONINI in the last 72 hours.  BNP: BNP (last 3 results)  Recent Labs  11/02/16 0841 11/05/16 1133 11/05/16 1414  BNP 729.6* 1,034.7* 1,019.5*    ProBNP (last 3 results) No results for input(s): PROBNP in the last 8760 hours.   D-Dimer No results for input(s): DDIMER in the last 72 hours. Hemoglobin A1C No results for input(s): HGBA1C in the last 72 hours. Fasting Lipid Panel No results  for input(s): CHOL, HDL, LDLCALC, TRIG, CHOLHDL, LDLDIRECT in the last 72 hours. Thyroid Function Tests No results for input(s): TSH, T4TOTAL, T3FREE, THYROIDAB in the last 72 hours.  Invalid input(s): FREET3  Other results:   Imaging    No results found.   Medications:     Scheduled Medications: . atorvastatin  40 mg Oral q1800  . chlorhexidine  15 mL Mouth Rinse BID  . heparin  5,000 Units Subcutaneous Q8H  . loratadine  10 mg Oral Daily  . mouth rinse  15 mL Mouth Rinse q12n4p  . sodium chloride flush  3 mL Intravenous Q12H  . [START ON 11/16/2016] torsemide  40 mg Oral Daily    Infusions: . sodium chloride      PRN Medications: sodium chloride, acetaminophen, hydrALAZINE, HYDROcodone-acetaminophen, ondansetron (ZOFRAN) IV, polyethylene glycol, sodium chloride, sodium chloride flush    Patient Profile    Hunter [name] is a 46 y.o. male history of sarcoidosis, chronic hypoxemic respiratory failure, and focal segmental glomerulosclerosis with nephrotic syndrome.  He was admitted with acute on chronic respiratory failure and volume overload.    Assessment/Plan    1. Acute on chronic hypoxemic/hypercarbic respiratory failure: He is on home oxygen at baseline. 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 It is possible that this is the primary respiratory problem. However, also some concern for OSH/OSA (never had sleep study). Pulmonary edema this admission may have contributed.   - Will need home oxygen and CPAP.  - Will need pulmonary followup as outpatient for sarcoidosis.  2. 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 After diuresis, filling pressures looked good on RHC yesterday.  On RHC, he had mild pulmonary hypertension with PVR 3.6 WU.  Possible component of group 5 PAH (sarcoidosis) versus group 3 from OHS/OSA.  With PVR not particularly high and suspected severe parenchymal disease from sarcoidosis, suspect that there is not going to be much benefit from treatment with selective pulmonary vasodilators.  - Given orthostasis yesterday and rise and creatinine, will hold torsemide today and restart 40 mg daily tomorrow.  - Continue oxygen to limit hypoxic pulmonary vasoconstriction.  3. Focal segmental glomerulosclerosis with nephrotic syndrome: Patient has diuresed well, looks near-euvolemic. - Renal has been following.  4. 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   Hunter [name] 11/15/2016 9:02 AM

## 2016-11-15 NOTE — Progress Notes (Signed)
Name: Hunter [name] MRN: [id number] DOB: 1970-05-26    ADMISSION DATE:  11/05/2016   CONSULTATION DATE:  11/06/2016  REFERRING MD :  Dr Alvino Chapel  CHIEF COMPLAINT:  Acute hypercapnic respiratory failure  BRIEF PATIENT DESCRIPTION: 46 y.o. 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 Questionable obesity hypoventilation syndrome based on. Also may have obstructive sleep apnea. Started on noninvasive positive pressure ventilation and Lasix drip. Denies any history of polysomnogram or witnessed apneas. No steroid therapy for sarcoidosis for years. Reportedly no other organ involvement of sarcoidosis.  SUBJECTIVE:  sats improved Feels better overall Some clear secretions Less edema Rise crt Neg balance  VITAL SIGNS: Temp:  [97.6 F (36.4 C)-98.7 F (37.1 C)] 98 F (36.7 C) (10/08 0746) Pulse Rate:  [89-96] 96 (10/08 0451) Resp:  [18] 18 (10/08 0746) BP: (104-116)/(63-81) 104/63 (10/08 0746) SpO2:  [93 %-98 %] 97 % (10/08 0746) Weight:  [114.8 kg (253 lb 1.6 oz)] 114.8 kg (253 lb 1.6 oz) (10/08 0451)  General: awake alert, no distress Neuro: awake alert nonfocal HEENT: jvd down PULM: CTP, CTA CV: s1 s2 RRR distant GI: soft, bs wnl, no r Extremities:  Min edema to none     Recent Labs Lab 11/13/16 0442 11/14/16 0420 11/15/16 0336  NA 138 135 137  K 4.1 4.3 4.6  CL 76* 77* 78*  CO2 >50* 47* 47*  BUN 40* 49* 55*  CREATININE 1.11 1.40* 1.55*  GLUCOSE 96 96 102*    Recent Labs Lab 11/09/16 0330 11/11/16 0503 11/11/16 1408  HGB 15.6 15.6 16.1  HCT 51.2 53.5* 55.8*  WBC 5.8 4.8 4.4  PLT 135* 120* 127*   ABG    Component Value Date/Time   PHART 7.315 (L) 11/09/2016 0419   PCO2ART 74.6 (HH) 11/09/2016 0419   PO2ART 64.0 (L) 11/09/2016 0419   HCO3 59.9 (H) 11/11/2016 1320   HCO3 58.6 (H) 11/11/2016 1320   TCO2 >50 (H) 11/11/2016 1320   TCO2 >50 (H) 11/11/2016 1320   ACIDBASEDEF 1.8 11/06/2016 0825   O2SAT 66.0 11/11/2016 1320   O2SAT 65.0 11/11/2016 1320   No results found. IMAGING/STUDIES: PFT 02/12/16: FVC 1.72 L (38%) FEV1 1.23 L (34%) FEV1/FVC 0 point someone FEF 25-75 0.74 L (20%) negative bronchodilator response TLC 3.99 L (55%) RV 105% DLCO corrected 62% TTE 9/29:  LV normal in size with EF 55-60% & normal regional wall motion. LA normal in size & RA severely dilated. RV dilated with moderately reduced systolic function. Pulmonary artery systolic pressure 32 mmHg. No aortic stenosis or regurgitation. Mild aortic root dilatation. No mitral stenosis or regurgitation. Mild pulmonic regurgitation with poorly visualized valve. Mild tricuspid regurgitation. No pericardial effusion. CXR PA/LAT 9/28:  Previously reviewed by me. Low lung volumes. Patchy bilateral lower lung predominant alveolar opacification as well as blunting of costophrenic angles consistent with pleural effusions. Fluid within right fissure as well. Borderline cardiomegaly with normal mediastinal and are contours. PORT CXR 10/1:  Personally reviewed by me. Regarding view. Some improvement in opacification in the right lower lung. Questionable slight increase in right pleural effusion. No new focal opacity. Mildly increased interstitial markings bilaterally.   MICROBIOLOGY: MRSA PCR 9/29:  Negative  ANTIBIOTICS:  LINES/TUBES: Foley 9/29 >>> PIV  SIGNIFICANT EVENTS: 09/28 - Admit 10/4 RHC RHC 11/11/16 Hemodynamics (mmHg) RA mean 6 RV 46/9 PA 49/20, mean 30 PCWP mean 10  Oxygen saturations: PA 66% AO 93%  Cardiac  Output (Fick) 5.57  Cardiac Index (Fick) 2.32 PVR 3.6 WU   Intake/Output Summary (Last 24 hours) at 11/15/16 1146 Last data filed at 11/15/16 1000  Gross per 24 hour  Intake           373.17 ml  Output             1950 ml  Net         -1576.83 ml    ASSESSMENT / PLAN:   46 y.o. Male with acute on chronic hypercarbic respiratory failure and acute on chronic  hypoxic respiratory failure. No evidence of sarcoidosis flare. Pulmonary hypertension suggested by echo and cath  A: Acute on chronic resp failure Hypoxia Sarcoidosis Pa htn Moderate Restrictive lung dz Likely a degree of over diuresis R/o OSA OHS  Plan: He is improved overall and I think his presentation after is c/w edema on top of unchanged sarcoidosis since 2016 I do not believe this is flare sarcoidosis In fact his sarcoid looks stable since 2016  He had small effusion rt on CT supporting above Agree should hold diuresis and follow trend as likely have maxed use of aggressive diuretics He sats 98% on 3 liters and can reduce to home 2 liters today Should also ambulate and check pulse ox If he will comply then he needs BIPAP nocturnal then to have follow up sleep study  Needs outpt pulm follow up dont see need pred Will sign off, call if needed, likely to dc home soon I updated the pt in full and d/w Dr Cleon Gustin. Tyson Alias, MD, FACP Pgr: [phone] [facility]

## 2016-11-15 NOTE — Progress Notes (Signed)
Pt c/o "knots" in his abdomen where he has received heparin injections. I personally palpated one of these "knots" which felt firm, just below the skin. Pt endorses telling previous RNs about these knots and his concern that they are "clots caused by the blood thinner." Heparin site switched to left arm. Oncoming nurse will be advised to observe for knot formation. May need to use a longer needle (was using 1.0cc insulin syringe) to ensure subcutaneous delivery and not wheal formation due to intradermal delivery.

## 2016-11-15 NOTE — Care Management (Addendum)
Case Management Note Initial Note Started from Deveron Furlong, RN,Case Manager 11-12-16 Patient Details  Name: Hunter [name] MRN: [id number] Date of Birth: [birth year]  Subjective/Objective: Pt presented for SOB and edema.  PTA pt was working part-time and living in [employer] for 3-4 weeks. Pt plans to return to work and is uninsured.  Pt interested in applying for assistance.  Pt had home O2 through [employer] but was not using at [facility] and was storing it in a Museum/gallery conservator, [employer] [phone]. Pt lives alone and has no family in the area. Pt a client of [facility] and using [facility] for medications.    Action/Plan: CSW and Biomedical engineer consulted.  RN at [facility], [facility] [phone], contacted and states that patient may use ventilator and O2 at [facility]. Pt has a disability sticker at [employer], which means he can stay in the dayroom until 10:30am.  After 10:30, he cannot return to dayroom until 1pm.  CM inquired about pt being a candidate for [facility] program.  CSW consulted with director of SW to see if patient is eligible for [employer].  [employer] requires monthly fee for O2 and ventilator, which patient will not be able to afford ($1200/month for Trilogy). [facility] to provide price for Trilogy, but will not be able to provide charity care. Follow-up appt made with [facility].  Expected Discharge Date:   (unknown)                 Expected Discharge Plan:  Homeless Shelter (Referral placed for HOPES project)  In-House Referral:  Clinical Social Work, Museum/gallery exhibitions officer  CM Consult, Follow-up appt scheduled, Indigent Health Clinic, Medication Assistance  Post Acute Care Choice:  Durable Medical Equipment Choice offered to:  Patient  DME Arranged:  Ventilator, 02 DME Agency:   [facility]  HH Arranged: RN, PT  HH Agency: [facility]   Status of Service:   Completed  If discussed at Long Length of Stay Meetings, dates discussed:  11-16-16  Additional Comments: 1703 11-17-16 Hunter Bamberger, RN,BSN [phone] Pt qualifies for home 02. Charity Care via [facility]. Portable E tank to be delivered to room prior to d/c. CSW did make pt aware of address to [employer]. CM did make RN Vickie aware that pt is for d/c today. She will try to contact him post d/c. Hospital follow up appointments made. No further needs from CM at this time.    1604 11-16-16 Hunter Bamberger, RN,BSN [phone] CM did speak with pt in regards to importance of wearing the BIPAP overnight. If pt is compliant with the BIPAP tonight the hospital can do a LOG for Trilogy. Pt will need HHRN/PT orders once stable. Charity Care set up with [facility]. Pt will d/c to [facility] once stable. No further needs from CM at this time.   1320 11-15-16 Hunter Bamberger, RN, BSN [phone] CM did speak with MD Tyson Alias and MD Sharon Seller in regards to disposition needs and if pt continues to need Trilogy. Plan for ABG in am and MD will discuss to see if still needs Trilogy. CM reached out to Respiratory Therapist in regards to additional suggestions on Face Mask- Full face mask tried and nasal pillow is not available in the hospital- not able to see if pt will be able to tolerate. CM did reach out to [facility] of Social Work in regards to [employer] for [employer]. Post ABG in am- CM  will know more in regards to if still needed. FC was contacted and Jasmine to bring pt application in regards to Medicaid. Nurse Chip Boer to speak with pt from [facility] as well. No further needs at this time.    Pt approved for Letter of Guarantee for Trilogy vent for 1 month.  [facility] will arrive on Sunday to arrange.  Pt will apply for Medicaid in the next month.  MD please place order for Trilogy vent through [facility].  Deveron Furlong, RN, CM 11/12/16 1623  Pt is eligible for HOPES project if Trilogy vent can be  obtained.  CM consulted [employer] ($1600/mo). Minerva Areola will be in contact with CM to discuss payment alternatives.  [facility] cost is $2005/month.  CM reached out to PA in regards to pt assistance with Trilogy. CM alerted pt he will go to HOPES project only if Trilogy vent obtained.  Otherwise, will go to [employer]. Deveron Furlong, RN 1600 11/12/16

## 2016-11-15 NOTE — Progress Notes (Signed)
CSW following for patient placement in [employer]. CSW met with patient at bedside and discussed program and answered patient's questions. Patient is accepted in [place] and has consented to participate in the program. CSW to support with discharge to hotel provided by [facility] when medically ready.  Hunter [name], [place]

## 2016-11-15 NOTE — Progress Notes (Addendum)
[facility] TEAM 1 - Stepdown/ICU Hunter [name]  WUJ:[id number] DOB: 31-Aug-1970 DOA: 11/05/2016 PCP: Bing Neighbors, FNP    Brief Narrative:  46 y.o. male with a Hx of Sarcoidosis, Chronic Respiratory Failure with Hypoxia (noncomliant w/ home O2), HTN, FSGS, CKD stage III, Tobacco abuse, OSA, Obesity Hypoventilation syndrome, and homelessness who was admitted 11/05/16 for worsening shortness of breath and lower extremity edema. It appears his PCP recently changed his diuretics.  Subjective: The patient reports that he is feeling better in general.  He has not however been compliant with his BiPAP at night.  He denies current chest pain nausea vomiting fevers chills or shortness of breath.  He does feel somewhat lightheaded when he gets up to walk around.  Assessment & Plan:  Acute RIGHT HEART failure / Chronic Diastolic CHF / Orthostatic hypotension Multifactorial:  noncompliance with medication, noncompliance with nutrition, sarcoidosis, OSA/OHS untreated. 11/11/16 S/P RIGHT HEART catheterization. Has diuresed well - net negative approximately 33 L since admission. Diuretic dosing per Cards.  Filed Weights   11/12/16 0600 11/13/16 0448 11/15/16 0451  Weight: 117 kg (258 lb) 115.2 kg (254 lb) 114.8 kg (253 lb 1.6 oz)    Acute on Chronic Respiratory Failure with Hypoxia and Hypercapnia Noncompliant with home O2. Per patient on 2-3 L O2 via [place]. Has not been consistently compliant withBipap even as an inpatientand likely will not be compliant after discharge. Will require sleep study as outpatient. Has not been consistentlyusing his home oxygen due to residing at a homeless shelter. Poor healthcare literacy despite multiple providers educating him.  To f/u w/ PCCM in office after d/c.    Loculated RIGHT Pleural Effusion PCXR 11/08/16: partially loculated right pleural effusion. Repeat PCXR on 11/11/16--> persistent effusion. Only had trace right-sided pleural fluid on high resolution CT. No  evidence of pneumonia clinically or on CT.  Acute on CKD stageIII with nephropathy (baseline Cr 0.92 in 09/13/2016)  Biopsy results 2017: Focal and segmental glomerulosclerosis+ early diabetic glomerulopathy mild arteriosclerosis with mild to moderate tubulointerstitial scarring. Significantly improved with diuresis. Bump in creatinine noted over the past 48 hours, likely from over diuresis.  Diuretic on hold for now - follow crt in AM.    Recent Labs Lab 11/11/16 1408 11/12/16 0415 11/13/16 0442 11/14/16 0420 11/15/16 0336  CREATININE 1.09 1.32* 1.11 1.40* 1.55*    Pre-Diabetes Per kidney biopsy early diabetic glomerulopathy. Hgb A1c 6.3%. Patient meets criteria for prediabetes. Per Diabetic Nutritionist note patient uninterested in diabetic education. attempt to avoid prednisone if possible.  HLD Continue Lipitor   Sarcoidosis of lung Has followed with pulmonology in the past, now off prednisone. High resolution CT done 11/12/16 and shows findings consistent with sarcoidosis but stable in appearance when compared to prior films.  No evidence of acute flare per pulmonary.  Morbid obesity Body mass index is 41.09 kg/m.  Homelessness Social worker consulted; patient would like to apply for disability, discuss payment plan options for hospitalization.    OSA/OHS Although no official sleep study patient clearly has signs and symptoms consistent with OSA/OHS to include hypercarbia. Will need to have outpatient sleep study. Although he has been ordered BiPAP QHS while in hospital due to risk of hypercarbia (refusing, will try a different mask), he has yet to be compliant with this. Consulted CM for Trilogy for home use however lack of financial resources will prove challenging.  Left ankle gout Uric acid 11.9. Status post treatment with prednisone.    DVT prophylaxis: SQ heparin  Code Status: FULL CODE Family Communication: no family present at time of exam  Disposition Plan:  determine if home BIPAP a possibility and if pt will actually comply w/ its use   Consultants:   Nephrology  Cardiology  PCCM  Procedures: none  Antimicrobials:  none  Objective: Blood pressure 104/63, pulse 96, temperature 98 F (36.7 C), temperature source Oral, resp. rate 18, height 6' (1.829 m), weight 114.8 kg (253 lb 1.6 oz), SpO2 97 %.  Intake/Output Summary (Last 24 hours) at 11/15/16 1123 Last data filed at 11/15/16 1000  Gross per 24 hour  Intake           373.17 ml  Output             1950 ml  Net         -1576.83 ml   Filed Weights   11/12/16 0600 11/13/16 0448 11/15/16 0451  Weight: 117 kg (258 lb) 115.2 kg (254 lb) 114.8 kg (253 lb 1.6 oz)    Examination: General: No acute respiratory distress - alert  Lungs: Clear to auscultation bilaterally without wheezes or crackles Cardiovascular: Regular rate and rhythm without murmur  Abdomen: Nontender, nondistended, soft, bowel sounds positive, no rebound, no ascites, no appreciable mass Extremities: No significant cyanosis, clubbing, or edema bilateral lower extremities  CBC:  Recent Labs Lab 11/09/16 0330 11/11/16 0503 11/11/16 1408  WBC 5.8 4.8 4.4  NEUTROABS 4.6 2.6  --   HGB 15.6 15.6 16.1  HCT 51.2 53.5* 55.8*  MCV 83.5 84.9 86.9  PLT 135* 120* 127*   Basic Metabolic Panel:  Recent Labs Lab 11/09/16 0330 11/10/16 1030 11/11/16 0503 11/11/16 1408 11/12/16 0415 11/13/16 0442 11/14/16 0420 11/15/16 0336  NA 141 141 143  --  139 138 135 137  K 4.9 3.8 3.9  --  3.4* 4.1 4.3 4.6  CL 98* 91* 86*  --  76* 76* 77* 78*  CO2 34* 44* 43*  --  >50* >50* 47* 47*  GLUCOSE 108* 134* 70  --  124* 96 96 102*  BUN 44* 41* 37*  --  43* 40* 49* 55*  CREATININE 1.24 1.26* 1.11 1.09 1.32* 1.11 1.40* 1.55*  CALCIUM 8.2* 8.6* 8.7*  --  8.4* 8.6* 8.3* 8.6*  MG 1.8 1.4* 1.3*  --  1.7 2.1  --   --    GFR: Estimated Creatinine Clearance: 76.3 mL/min (A) (by C-G formula based on SCr of 1.55 mg/dL  (H)).  Liver Function Tests: No results for input(s): AST, ALT, ALKPHOS, BILITOT, PROT, ALBUMIN in the last 168 hours. No results for input(s): LIPASE, AMYLASE in the last 168 hours. No results for input(s): AMMONIA in the last 168 hours.  Coagulation Profile:  Recent Labs Lab 11/11/16 0836  INR 0.89   HbA1C: Hgb A1c MFr Bld  Date/Time Value Ref Range Status  11/10/2016 08:55 AM 6.3 (H) 4.8 - 5.6 % Final    Comment:    (NOTE) Pre diabetes:          5.7%-6.4% Diabetes:              >6.4% Glycemic control for   <7.0% adults with diabetes     Recent Results (from the past 240 hour(s))  MRSA PCR Screening     Status: None   Collection Time: 11/06/16  1:40 AM  Result Value Ref Range Status   MRSA by PCR NEGATIVE NEGATIVE Final    Comment:        The GeneXpert MRSA  Assay (FDA approved for NASAL specimens only), is one component of a comprehensive MRSA colonization surveillance program. It is not intended to diagnose MRSA infection nor to guide or monitor treatment for MRSA infections.   MRSA PCR Screening     Status: None   Collection Time: 11/10/16  5:11 AM  Result Value Ref Range Status   MRSA by PCR NEGATIVE NEGATIVE Final    Comment:        The GeneXpert MRSA Assay (FDA approved for NASAL specimens only), is one component of a comprehensive MRSA colonization surveillance program. It is not intended to diagnose MRSA infection nor to guide or monitor treatment for MRSA infections.      Scheduled Meds: . atorvastatin  40 mg Oral q1800  . chlorhexidine  15 mL Mouth Rinse BID  . heparin  5,000 Units Subcutaneous Q8H  . loratadine  10 mg Oral Daily  . mouth rinse  15 mL Mouth Rinse q12n4p  . sodium chloride flush  3 mL Intravenous Q12H  . [START ON 11/16/2016] torsemide  40 mg Oral Daily     LOS: 10 days   Lonia Blood, MD [facility] Office  [phone] Pager - Text Page per Loretha Stapler as per below:  On-Call/Text Page:      [email or web address]       password [email or web address]  If 7PM-7AM, please contact night-coverage [email or web address] Password [email or web address] 11/15/2016, 11:23 AM

## 2016-11-15 NOTE — Progress Notes (Signed)
Physical Therapy Treatment Patient Details Name: Hunter [name] MRN: [id number] DOB: 1970-09-08 Today's Date: 11/15/2016    History of Present Illness 46 y.o. male male history of sarcoidosis, chronic hypoxemic respiratory failure, and focal segmental glomerulosclerosis with nephrotic syndrome.  He was admitted with acute on chronic respiratory failure and volume overload.      PT Comments    Patient limited today secondary to symptomatic orthostatic hypotension. See assessment below.  Supine BP 97/72 HR 90 bpm Sp02 95% on RA Sitting BP 90/64  HR 92 bpm  Sp02 86% on RA, donned 2L/min 02 Standing BP 77/62  HR 91 bpm  Sp02 94% on 3L/min 02  Pt reports dizziness and seeing stars during positional movements. Lengthy discussion on what orthostatic hypotension means, importance of maintaining positions prior to moving, self monitoring for symptoms etc. Recommend trying ted hose and/or ace wraps next session as tolerated to see if this improves hypotension so pt can tolerate more mobility. Worried about going back to work and not being able to mobilize while in hospital. Will follow up.  Follow Up Recommendations  Home health PT;Other (comment) [facility] for chronic disease management. )     Equipment Recommendations  Other (comment) (TBA)    Recommendations for Other Services       Precautions / Restrictions Precautions Precautions: Fall Precaution Comments: orthostatic hypotension Restrictions Weight Bearing Restrictions: No    Mobility  Bed Mobility Overal bed mobility: Needs Assistance Bed Mobility: Supine to Sit;Sit to Supine     Supine to sit: Supervision;HOB elevated Sit to supine: Supervision;HOB elevated   General bed mobility comments: Used rails; supervision for safety. performed x2 for orthostatics.  Transfers Overall transfer level: Needs assistance Equipment used: None Transfers: Sit to/from Stand Sit to Stand: Min guard         General transfer comment: Min  guard for safety; cues to self-monitor for activity tolerance. Stood from [employer]. Deferred transfer to chair due to orthostatics and dizziness.  Ambulation/Gait             General Gait Details: Deferred   Stairs            Wheelchair Mobility    Modified Rankin (Stroke Patients Only)       Balance Overall balance assessment: Needs assistance Sitting-balance support: Feet supported;No upper extremity supported Sitting balance-Leahy Scale: Good     Standing balance support: During functional activity;Single extremity supported Standing balance-Leahy Scale: Poor Standing balance comment: Able to stand statically but reaches for table for support when dizziness worsens.                             Cognition Arousal/Alertness: Awake/alert Behavior During Therapy: Anxious Overall Cognitive Status: Within Functional Limits for tasks assessed                                 General Comments: Pt at one point in session asking if he is going to die; I responded to him that many people live full lives with CHF      Exercises      General Comments General comments (skin integrity, edema, etc.): See assessment for orthostatic vitals.      Pertinent Vitals/Pain Pain Assessment: No/denies pain    Home Living                      Prior  Function            PT Goals (current goals can now be found in the care plan section) Progress towards PT goals: Not progressing toward goals - comment (secondary to dizziness and orthostatic hypotension)    Frequency    Min 3X/week      PT Plan Current plan remains appropriate    Co-evaluation              AM-PAC PT "6 Clicks" Daily Activity  Outcome Measure  Difficulty turning over in bed (including adjusting bedclothes, sheets and blankets)?: None Difficulty moving from lying on back to sitting on the side of the bed? : None Difficulty sitting down on and standing up from a  chair with arms (e.g., wheelchair, bedside commode, etc,.)?: None Help needed moving to and from a bed to chair (including a wheelchair)?: None Help needed walking in hospital room?: A Little Help needed climbing 3-5 steps with a railing? : A Lot 6 Click Score: 21    End of Session Equipment Utilized During Treatment: Oxygen Activity Tolerance: Treatment limited secondary to medical complications (Comment) (orthostatic hypotension) Patient left: in bed;with call bell/phone within reach Nurse Communication: Mobility status;Other (comment) (orthostatics) PT Visit Diagnosis: Unsteadiness on feet (R26.81);Other abnormalities of gait and mobility (R26.89);Dizziness and giddiness (R42)     Time: 4098-1191 PT Time Calculation (min) (ACUTE ONLY): 31 min  Charges:  $Therapeutic Activity: 23-37 mins                    G Codes:       Mylo Red, PT, DPT [phone]     Blake Divine A Jobina Maita 11/15/2016, 11:09 AM

## 2016-11-16 DIAGNOSIS — I5031 Acute diastolic (congestive) heart failure: Secondary | ICD-10-CM

## 2016-11-16 LAB — BLOOD GAS, ARTERIAL
Acid-Base Excess: 21.3 mmol/L — ABNORMAL HIGH (ref 0.0–2.0)
Bicarbonate: 47.7 mmol/L — ABNORMAL HIGH (ref 20.0–28.0)
DRAWN BY: 398981
O2 Content: 3 L/min
O2 SAT: 91.4 %
PATIENT TEMPERATURE: 98.6
pCO2 arterial: 81.4 mmHg (ref 32.0–48.0)
pH, Arterial: 7.386 (ref 7.350–7.450)
pO2, Arterial: 67.3 mmHg — ABNORMAL LOW (ref 83.0–108.0)

## 2016-11-16 LAB — BASIC METABOLIC PANEL
Anion gap: 11 (ref 5–15)
BUN: 60 mg/dL — ABNORMAL HIGH (ref 6–20)
CHLORIDE: 84 mmol/L — AB (ref 101–111)
CO2: 42 mmol/L — AB (ref 22–32)
CREATININE: 1.51 mg/dL — AB (ref 0.61–1.24)
Calcium: 8.4 mg/dL — ABNORMAL LOW (ref 8.9–10.3)
GFR calc non Af Amer: 53 mL/min — ABNORMAL LOW (ref >60)
Glucose, Bld: 85 mg/dL (ref 65–99)
POTASSIUM: 4 mmol/L (ref 3.5–5.1)
Sodium: 137 mmol/L (ref 135–145)

## 2016-11-16 MED ORDER — MAGNESIUM OXIDE 400 (241.3 MG) MG PO TABS
400.0000 mg | ORAL_TABLET | Freq: Every evening | ORAL | Status: DC
  Administered 2016-11-16 – 2016-11-17 (×2): 400 mg via ORAL
  Filled 2016-11-16 (×2): qty 1

## 2016-11-16 MED ORDER — DIPHENHYDRAMINE HCL 25 MG PO CAPS
50.0000 mg | ORAL_CAPSULE | Freq: Once | ORAL | Status: AC
  Administered 2016-11-16: 50 mg via ORAL
  Filled 2016-11-16: qty 2

## 2016-11-16 MED ORDER — DIPHENHYDRAMINE HCL 25 MG PO CAPS: 25.0000 mg | ORAL_CAPSULE | ORAL | Status: DC | PRN

## 2016-11-16 NOTE — Progress Notes (Signed)
CSW and RNCM met with patient together at bedside. RNCM encouraged patient to use bipap, as patient has been noncompliant during admission. RNCM reiterated previous discussions of need for compliance with bipap. Patient verbalized agreement to use bipap as directed. CSW following for placement in [facility] program. CSW to assist with planning transportation to hotel provided by [place]. CSW will support in transitioning patient to [facility] when ready.   Hunter [name], [place]

## 2016-11-16 NOTE — Progress Notes (Signed)
Patient refused BiPAP for the night. States he cannot wear our masks because they suffocate him. RT will continue to monitor.

## 2016-11-16 NOTE — Progress Notes (Signed)
Patient ID: Hunter [name], male   DOB: [birth year], 46 y.o.   MRN: [id number]     Advanced Heart Failure Rounding Note  Primary Cardiologist: Shirlee Latch  Subjective:    Feeling much better. Thankful to those that have taken care of him. Feels like his "life has been saved".  Diuretics held 11/15/16. Weight stable. Weight down 50 lbs total.   Creatinine remains mildly elevated at 1.51 up from baseline 1.0 - 1.2.  High Res CT Chest: Findings consistent with chronic sarcoidosis, similar to 2016.   RHC 11/11/16 Hemodynamics (mmHg) RA mean 6 RV 46/9 PA 49/20, mean 30 PCWP mean 10 Oxygen saturations: PA 66% AO 93% Cardiac Output (Fick) 5.57  Cardiac Index (Fick) 2.32 PVR 3.6 WU  Echo (9/18): EF 55-60%, septal flattening, dilated RV with moderately decreased systolic function, PASP 32 mmHg.    Objective:   Weight Range: 253 lb 12.8 oz (115.1 kg) Body mass index is 34.42 kg/m.   Vital Signs:   Temp:  [97.6 F (36.4 C)-98.9 F (37.2 C)] 97.6 F (36.4 C) (10/08 2040) Pulse Rate:  [89] 89 (10/08 2040) Resp:  [18] 18 (10/08 2040) BP: (95-112)/(78-89) 95/78 (10/08 2040) SpO2:  [94 %-100 %] 94 % (10/08 2040) Weight:  [253 lb 12.8 oz (115.1 kg)] 253 lb 12.8 oz (115.1 kg) (10/09 0500) Last BM Date: 11/13/16  Weight change: Filed Weights   11/13/16 0448 11/15/16 0451 11/16/16 0500  Weight: 254 lb (115.2 kg) 253 lb 1.6 oz (114.8 kg) 253 lb 12.8 oz (115.1 kg)    Intake/Output:   Intake/Output Summary (Last 24 hours) at 11/16/16 0948 Last data filed at 11/16/16 0900  Gross per 24 hour  Intake              500 ml  Output             1400 ml  Net             -900 ml      Physical Exam    General: No resp difficulty. HEENT: Normal. On O2 via [place].  Neck: Supple. JVP 5-6. Carotids 2+ bilat; no bruits. No thyromegaly or nodule noted. Cor: PMI nondisplaced. RRR, No M/G/R noted Lungs: Clear, though mildly diminished basilar sounds.  Abdomen: Soft, non-tender, non-distended, no  HSM. No bruits or masses. +BS  Extremities: No cyanosis, clubbing, rash, R and LLE no edema.  Neuro: Alert & orientedx3, cranial nerves grossly intact. moves all 4 extremities w/o difficulty. Affect pleasant   Telemetry   NSR, personally reviewed.     EKG    N/A  Labs    CBC No results for input(s): WBC, NEUTROABS, HGB, HCT, MCV, PLT in the last 72 hours. Basic Metabolic Panel  Recent Labs  11/15/16 0336 11/16/16 0304  NA 137 137  K 4.6 4.0  CL 78* 84*  CO2 47* 42*  GLUCOSE 102* 85  BUN 55* 60*  CREATININE 1.55* 1.51*  CALCIUM 8.6* 8.4*   Liver Function Tests No results for input(s): AST, ALT, ALKPHOS, BILITOT, PROT, ALBUMIN in the last 72 hours. No results for input(s): LIPASE, AMYLASE in the last 72 hours. Cardiac Enzymes No results for input(s): CKTOTAL, CKMB, CKMBINDEX, TROPONINI in the last 72 hours.  BNP: BNP (last 3 results)  Recent Labs  11/02/16 0841 11/05/16 1133 11/05/16 1414  BNP 729.6* 1,034.7* 1,019.5*    ProBNP (last 3 results) No results for input(s): PROBNP in the last 8760 hours.   D-Dimer No results for input(s): DDIMER in  the last 72 hours. Hemoglobin A1C No results for input(s): HGBA1C in the last 72 hours. Fasting Lipid Panel No results for input(s): CHOL, HDL, LDLCALC, TRIG, CHOLHDL, LDLDIRECT in the last 72 hours. Thyroid Function Tests No results for input(s): TSH, T4TOTAL, T3FREE, THYROIDAB in the last 72 hours.  Invalid input(s): FREET3  Other results:   Imaging    No results found.   Medications:     Scheduled Medications: . atorvastatin  40 mg Oral q1800  . chlorhexidine  15 mL Mouth Rinse BID  . heparin  5,000 Units Subcutaneous Q8H  . loratadine  10 mg Oral Daily  . mouth rinse  15 mL Mouth Rinse q12n4p  . torsemide  40 mg Oral Daily    Infusions:   PRN Medications: acetaminophen, diphenhydrAMINE, hydrALAZINE, HYDROcodone-acetaminophen, ondansetron (ZOFRAN) IV, polyethylene glycol, sodium  chloride    Patient Profile   Hunter [name] is a 46 y.o. male history of sarcoidosis, chronic hypoxemic respiratory failure, and focal segmental glomerulosclerosis with nephrotic syndrome.  He was admitted with acute on chronic respiratory failure and volume overload.    Assessment/Plan    1. Acute on chronic hypoxemic/hypercarbic respiratory failure: He is on home oxygen at baseline. 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 It is possible that this is the primary respiratory problem. However, also some concern for OSH/OSA (never had sleep study). Pulmonary edema this admission may have contributed.  - Will need home oxygen and CPAP.  - Will need pulmonary followup as outpatient for sarcoidosis.  2. 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 After diuresis, filling pressures looked good on RHC 11/14/16.  On RHC, he had mild pulmonary hypertension with PVR 3.6 WU.  Possible component of group 5 PAH (sarcoidosis) versus group 3 from OHS/OSA.  With PVR not particularly high and suspected severe parenchymal disease from sarcoidosis, suspect that there is not going to be much benefit from treatment with selective pulmonary vasodilators.  - Given orthostasis 11/14/16 and rise and creatinine torsemide held.  - Will resume torsemide today at 40 mg daily.  - Continue oxygen to limit hypoxic pulmonary vasoconstriction.  3. Focal segmental glomerulosclerosis with nephrotic syndrome:  - Pt has diuresed. Looks euvolemic on exam.  - Renal has been following.  4. AKI - Baseline creatinine 1.0-1.2 - Remains mildly elevated at 1.51 after marked diuresis (50 lbs total) 5. 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  - Case management working on with [employer], [employer] (his 02), and [facility].  - Referral placed for [facility]  project.  Disposition pending. Awaiting on housing situation and 02.   Graciella Freer, PA-C  11/16/2016 9:48 AM  Advanced Heart Failure Team Pager [phone] (M-F; 7a - 4p)  Please contact [facility] for night-coverage after hours (4p -7a ) and weekends on [email or web address]  Patient seen with PA, agree with the above note.   Volume looks good, weight steady.  BUN/creatinine have stabilized and he is no longer orthostatic.  Able to walk with PT today.  - Agree with restarting torsemide 40 mg daily for home.  - I will start him on magnesium oxide to see if this helps with his nocturnal cramps.   PaCO2 remains high, could not tolerate Bipap mask again last night, wants nasal only mask.  Suspect OHS/OSA in addition to his pulmonary sarcoidosis.  Working on getting him a mask that he can tolerate.  Ongoing help from social work to firm up living situation prior to discharge appreciated.    We will arrange followup for him.   Marca Ancona 11/16/2016 11:05 AM

## 2016-11-16 NOTE — Progress Notes (Signed)
Physical Therapy Treatment Patient Details Name: Hunter [name] MRN: [id number] DOB: [birth year] Today's Date: 11/16/2016    History of Present Illness 46 y.o. male male history of sarcoidosis, chronic hypoxemic respiratory failure, and focal segmental glomerulosclerosis with nephrotic syndrome.  He was admitted with acute on chronic respiratory failure and volume overload.      PT Comments    Pt making good progress today, was able to ambulate 300' with 1 seated rest break, with RW and min-guard A. Pt wanted to try ambulation without RW so went 5' with significant LOB to L with mod A to correct. Was eye opening for pt that he really needs RW at this point. PT will continue to follow.    Follow Up Recommendations  Home health PT;Other (comment) [facility] for chronic disease management. )     Equipment Recommendations  Rolling walker with 5" wheels    Recommendations for Other Services OT consult     Precautions / Restrictions Precautions Precautions: Fall Precaution Comments: orthostatic hypotension Restrictions Weight Bearing Restrictions: No    Mobility  Bed Mobility               General bed mobility comments: pt in chair  Transfers Overall transfer level: Needs assistance Equipment used: Rolling walker (2 wheeled) Transfers: Sit to/from Stand Sit to Stand: Min guard         General transfer comment: vc's for pause before begninng mobility to check for light headedness due to [facility]  Ambulation/Gait Ambulation/Gait assistance: Min guard;Mod assist Ambulation Distance (Feet): 300 Feet Assistive device: None;Rolling walker (2 wheeled) Gait Pattern/deviations: Wide base of support;Antalgic Gait velocity: decreased Gait velocity interpretation: Below normal speed for age/gender General Gait Details: ambulated 295' with RW, vc's for erect posture. 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 Pt wanted to try ambulating  without RW, 5' no AD, mod A needed to prevent fall, LOB to L   Stairs            Wheelchair Mobility    Modified Rankin (Stroke Patients Only)       Balance Overall balance assessment: Needs assistance Sitting-balance support: Feet supported;No upper extremity supported Sitting balance-Leahy Scale: Good     Standing balance support: During functional activity;No upper extremity supported Standing balance-Leahy Scale: Fair Standing balance comment: pt can stand in static position with no LOB, but when he moves his feet he needs UE support                            Cognition Arousal/Alertness: Awake/alert Behavior During Therapy: Anxious Overall Cognitive Status: Within Functional Limits for tasks assessed                                 General Comments: pt frustrated with feeling that he doesn't know the one cause that brought him to the hospital and how to keep it from happening again      Exercises General Exercises - Upper Extremity Chair Push Up: AROM;5 reps General Exercises - Lower Extremity Ankle Circles/Pumps: AROM;Both;10 reps;Seated Long Arc Quad: AROM;Both;10 reps;Seated Hip Flexion/Marching: AROM;Both;10 reps;Standing Mini-Sqauts: AROM;10 reps;Standing    General Comments General comments (skin integrity, edema, etc.): No light headedness during session, BP in standing 100/82, consistent with msmt prior to that in sitting      Pertinent Vitals/Pain Pain Assessment:  Faces Faces Pain Scale: Hurts little more Pain Location: left dorsal foot Pain Descriptors / Indicators: Constant Pain Intervention(s): Limited activity within patient's tolerance;Monitored during session;Other (comment) (wore shoes for gait)    Home Living                      Prior Function            PT Goals (current goals can now be found in the care plan section) Acute Rehab PT Goals Patient Stated Goal: wants to be able to stand and  walk PT Goal Formulation: With patient Time For Goal Achievement: 11/28/16 Potential to Achieve Goals: Good Progress towards PT goals: Progressing toward goals    Frequency    Min 3X/week      PT Plan Current plan remains appropriate;Equipment recommendations need to be updated    Co-evaluation              AM-PAC PT "6 Clicks" Daily Activity  Outcome Measure  Difficulty turning over in bed (including adjusting bedclothes, sheets and blankets)?: None Difficulty moving from lying on back to sitting on the side of the bed? : None Difficulty sitting down on and standing up from a chair with arms (e.g., wheelchair, bedside commode, etc,.)?: None Help needed moving to and from a bed to chair (including a wheelchair)?: None Help needed walking in hospital room?: A Little Help needed climbing 3-5 steps with a railing? : A Lot 6 Click Score: 21    End of Session Equipment Utilized During Treatment: Oxygen (2L) Activity Tolerance: Patient tolerated treatment well Patient left: with call bell/phone within reach;in chair Nurse Communication: Mobility status PT Visit Diagnosis: Unsteadiness on feet (R26.81);Other abnormalities of gait and mobility (R26.89);Dizziness and giddiness (R42)     Time: 2130-8657 PT Time Calculation (min) (ACUTE ONLY): 51 min  Charges:  $Gait Training: 23-37 mins $Therapeutic Exercise: 8-22 mins                    G Codes:       Lyanne Co, PT  Acute Rehab Services  [phone]    Lawana Chambers Sherry Rogus 11/16/2016, 1:41 PM

## 2016-11-16 NOTE — Progress Notes (Addendum)
11/15/16 2200 pt refusing BiPAP for sleep  11/16/16 0500 pt c/o cramps in hands. Labs at 0304 include K+ of 4.0 and Ca++ of 8.4 Given 4oz orange juice

## 2016-11-16 NOTE — Plan of Care (Signed)
Problem: Safety: Goal: Ability to remain free from injury will improve Outcome: Progressing Pt using the call light when he needs assistance setting up to ambulate to the bathroom or to sit by the sink to wash up  Problem: Skin Integrity: Goal: Risk for impaired skin integrity will decrease Outcome: Progressing Pt ambulatory in his room, recognized skin changes, and is able to verbalize discomfort.

## 2016-11-16 NOTE — Progress Notes (Signed)
CRITICAL VALUE ALERT  Critical Value:  ABG CO2 = 81.4  Date & Time Notied:  11/16/16 0527  Provider Notified: Merdis Delay  Orders Received/Actions taken: MD aware. Pt educated on importance of BiPAP

## 2016-11-16 NOTE — Progress Notes (Signed)
[name] TEAM 1 - Stepdown/ICU [name]  ZHY:[id number] DOB: March 23, 1970 DOA: 11/05/2016 PCP: Bing Neighbors, FNP    Brief Narrative:  46 y.o. male with a Hx of Sarcoidosis, Chronic Respiratory Failure with Hypoxia (noncomliant w/ home O2), HTN, FSGS, CKD stage III, Tobacco abuse, OSA, Obesity Hypoventilation syndrome, and homelessness who was admitted 11/05/16 for worsening shortness of breath and lower extremity edema. It appears his PCP recently changed his diuretics.  Subjective: The patient tells me it has all been a misunderstanding, and that he is perfectly willing to comply w/ BIPAP while an inpt if that is what is required to get BIPAP as an outpt.  I explained to him that we do not have the nasal pillow devices available as an inpt.  He is willing to decide between the large nasal mask and the large full mask tonight, and commits to sticking with one or the other from here out.  He denies current sob, n/v, abdom pain, or chest pain.    Assessment & Plan:  Acute RIGHT HEART failure / Chronic Diastolic CHF / Orthostatic hypotension Multifactorial:  noncompliance with medication, noncompliance with nutrition, sarcoidosis, OSA/OHS untreated. 11/11/16 S/P RIGHT HEART catheterization. Has diuresed well - net negative approximately 33 L since admission. Diuretic dosing as per Cards.  Filed Weights   11/13/16 0448 11/15/16 0451 11/16/16 0500  Weight: 115.2 kg (254 lb) 114.8 kg (253 lb 1.6 oz) 115.1 kg (253 lb 12.8 oz)    Acute on Chronic Respiratory Failure with Hypoxia and Hypercapnia Noncompliant with home O2. Per patient on 2-3 L O2 via [place]. Has not been consistently compliant with BIPAP even as an inpatient but now is committing to QHS use w/o fail starting tonight. Has not been consistentlyusing his home oxygen due to residing at a homeless shelter. Poor healthcare literacy despite multiple providers educating him.  To f/u w/ PCCM in office after d/c w/ plans for an outpt  sleep study.  If complies w/ BIPAP tonight will proceed w/ attempts to arrange outpt Trilogy device and domicile in which it can be used.   Loculated RIGHT Pleural Effusion PCXR 11/08/16: partially loculated right pleural effusion. Repeat PCXR on 11/11/16--> persistent effusion. Only had trace right-sided pleural fluid on high resolution CT. No evidence of pneumonia clinically or on CT.  Acute on CKD stageIII with nephropathy (baseline Cr 0.92 in 09/13/2016)  Biopsy results 2017: Focal and segmental glomerulosclerosis+ early diabetic glomerulopathy mild arteriosclerosis with mild to moderate tubulointerstitial scarring. Significantly improved with diuresis. Bump in creatinine noted over the past 48 hours, likely from diuresis.  Diuretic being resumed by CHF Team as it has stabilized for now.    Recent Labs Lab 11/12/16 0415 11/13/16 0442 11/14/16 0420 11/15/16 0336 11/16/16 0304  CREATININE 1.32* 1.11 1.40* 1.55* 1.51*    Pre-Diabetes Per kidney biopsy early diabetic glomerulopathy. Hgb A1c 6.3%. Patient meets criteria for prediabetes. Per Diabetic Nutritionist note patient uninterested in diabetic education. attempt to avoid prednisone if possible.  HLD Continue Lipitor   Sarcoidosis of lung Has followed with pulmonology in the past, now off prednisone. High resolution CT done 11/12/16 and shows findings consistent with sarcoidosis but stable in appearance when compared to prior films.  No evidence of acute flare per Pulmonary.  Morbid obesity Body mass index is 41.09 kg/m.  Homelessness Social worker consulted  OSA/OHS Although no official sleep study patient clearly has signs and symptoms consistent with OSA/OHS to include severe hypercarbia (confirmed again w/ ABG this AM).  Will need to have outpatient sleep study. Although he has been ordered BiPAP QHS while in hospital he has yet to be compliant with this. Consulted CM for Trilogy for home use however lack of financial  resources is proving challenging.  If he complies tonight as discussed above, we will continue w/ attempts to arrange for home Trilogy and d/c to facility that can accommodate it.    Left ankle gout Uric acid 11.9. Status post treatment with prednisone.    DVT prophylaxis: SQ heparin  Code Status: FULL CODE Family Communication: no family present at time of exam  Disposition Plan: appears to now be medically stable for d/c - remaining issue to address before d/c is that of Trilogy device - see extended discussion above - could be d/c in 24-48hrs if he complies w/ BIPAP and device and domicile can be arranged   Consultants:   Nephrology  Cardiology  PCCM  Procedures: none  Antimicrobials:  none  Objective: Blood pressure 107/79, pulse 82, temperature 98.4 F (36.9 C), temperature source Oral, resp. rate 18, height 6' (1.829 m), weight 115.1 kg (253 lb 12.8 oz), SpO2 99 %.  Intake/Output Summary (Last 24 hours) at 11/16/16 1547 Last data filed at 11/16/16 1300  Gross per 24 hour  Intake              840 ml  Output             1350 ml  Net             -510 ml   Filed Weights   11/13/16 0448 11/15/16 0451 11/16/16 0500  Weight: 115.2 kg (254 lb) 114.8 kg (253 lb 1.6 oz) 115.1 kg (253 lb 12.8 oz)    Examination: General: No acute respiratory distress  Lungs: CTA th/o - no wheezing  Cardiovascular: RRR - no rub Abdomen: NT/ND, soft, bs+ Extremities: No significant edema bilateral lower extremities  CBC:  Recent Labs Lab 11/11/16 0503 11/11/16 1408  WBC 4.8 4.4  NEUTROABS 2.6  --   HGB 15.6 16.1  HCT 53.5* 55.8*  MCV 84.9 86.9  PLT 120* 127*   Basic Metabolic Panel:  Recent Labs Lab 11/10/16 1030 11/11/16 0503  11/12/16 0415 11/13/16 0442 11/14/16 0420 11/15/16 0336 11/16/16 0304  NA 141 143  --  139 138 135 137 137  K 3.8 3.9  --  3.4* 4.1 4.3 4.6 4.0  CL 91* 86*  --  76* 76* 77* 78* 84*  CO2 44* 43*  --  >50* >50* 47* 47* 42*  GLUCOSE 134* 70  --   124* 96 96 102* 85  BUN 41* 37*  --  43* 40* 49* 55* 60*  CREATININE 1.26* 1.11  < > 1.32* 1.11 1.40* 1.55* 1.51*  CALCIUM 8.6* 8.7*  --  8.4* 8.6* 8.3* 8.6* 8.4*  MG 1.4* 1.3*  --  1.7 2.1  --   --   --   < > = values in this interval not displayed. GFR: Estimated Creatinine Clearance: 78.4 mL/min (A) (by C-G formula based on SCr of 1.51 mg/dL (H)).  Liver Function Tests: No results for input(s): AST, ALT, ALKPHOS, BILITOT, PROT, ALBUMIN in the last 168 hours.  Coagulation Profile:  Recent Labs Lab 11/11/16 0836  INR 0.89   HbA1C: Hgb A1c MFr Bld  Date/Time Value Ref Range Status  11/10/2016 08:55 AM 6.3 (H) 4.8 - 5.6 % Final    Comment:    (NOTE) Pre diabetes:  5.7%-6.4% Diabetes:              >6.4% Glycemic control for   <7.0% adults with diabetes     Recent Results (from the past 240 hour(s))  MRSA PCR Screening     Status: None   Collection Time: 11/10/16  5:11 AM  Result Value Ref Range Status   MRSA by PCR NEGATIVE NEGATIVE Final    Comment:        The GeneXpert MRSA Assay (FDA approved for NASAL specimens only), is one component of a comprehensive MRSA colonization surveillance program. It is not intended to diagnose MRSA infection nor to guide or monitor treatment for MRSA infections.      Scheduled Meds: . atorvastatin  40 mg Oral q1800  . chlorhexidine  15 mL Mouth Rinse BID  . heparin  5,000 Units Subcutaneous Q8H  . loratadine  10 mg Oral Daily  . magnesium oxide  400 mg Oral QPM  . mouth rinse  15 mL Mouth Rinse q12n4p  . torsemide  40 mg Oral Daily     LOS: 11 days   Lonia Blood, MD [facility] Office  [phone] Pager - Text Page per Loretha Stapler as per below:  On-Call/Text Page:      [email or web address]      password [email or web address]  If 7PM-7AM, please contact night-coverage [email or web address] Password [email or web address] 11/16/2016, 3:47 PM

## 2016-11-17 DIAGNOSIS — J90 Pleural effusion, not elsewhere classified: Secondary | ICD-10-CM

## 2016-11-17 DIAGNOSIS — R7303 Prediabetes: Secondary | ICD-10-CM

## 2016-11-17 DIAGNOSIS — I5033 Acute on chronic diastolic (congestive) heart failure: Secondary | ICD-10-CM

## 2016-11-17 DIAGNOSIS — D860 Sarcoidosis of lung: Secondary | ICD-10-CM

## 2016-11-17 DIAGNOSIS — I50811 Acute right heart failure: Secondary | ICD-10-CM

## 2016-11-17 DIAGNOSIS — N170 Acute kidney failure with tubular necrosis: Secondary | ICD-10-CM

## 2016-11-17 DIAGNOSIS — N183 Chronic kidney disease, stage 3 (moderate): Secondary | ICD-10-CM

## 2016-11-17 LAB — CBC
HEMATOCRIT: 53.4 % — AB (ref 39.0–52.0)
Hemoglobin: 15.4 g/dL (ref 13.0–17.0)
MCH: 24.5 pg — ABNORMAL LOW (ref 26.0–34.0)
MCHC: 28.8 g/dL — AB (ref 30.0–36.0)
MCV: 84.9 fL (ref 78.0–100.0)
PLATELETS: 123 10*3/uL — AB (ref 150–400)
RBC: 6.29 MIL/uL — ABNORMAL HIGH (ref 4.22–5.81)
RDW: 19.4 % — AB (ref 11.5–15.5)
WBC: 4.3 10*3/uL (ref 4.0–10.5)

## 2016-11-17 LAB — BASIC METABOLIC PANEL
Anion gap: 14 (ref 5–15)
BUN: 57 mg/dL — AB (ref 6–20)
CALCIUM: 8.7 mg/dL — AB (ref 8.9–10.3)
CO2: 39 mmol/L — ABNORMAL HIGH (ref 22–32)
Chloride: 85 mmol/L — ABNORMAL LOW (ref 101–111)
Creatinine, Ser: 1.36 mg/dL — ABNORMAL HIGH (ref 0.61–1.24)
GFR calc Af Amer: >60 mL/min (ref >60)
Glucose, Bld: 89 mg/dL (ref 65–99)
POTASSIUM: 4.4 mmol/L (ref 3.5–5.1)
SODIUM: 138 mmol/L (ref 135–145)

## 2016-11-17 MED ORDER — ATORVASTATIN CALCIUM 40 MG PO TABS: 40.0000 mg | ORAL_TABLET | Freq: Every day | ORAL | 0 refills | Status: DC

## 2016-11-17 MED ORDER — TORSEMIDE 20 MG PO TABS: 40.0000 mg | ORAL_TABLET | Freq: Every day | ORAL | 0 refills | Status: DC

## 2016-11-17 MED ORDER — MAGNESIUM OXIDE 400 (241.3 MG) MG PO TABS: 400.0000 mg | ORAL_TABLET | Freq: Every evening | ORAL | 0 refills | Status: DC

## 2016-11-17 MED ORDER — LORATADINE 10 MG PO TABS: 10.0000 mg | ORAL_TABLET | Freq: Every day | ORAL | 0 refills | Status: DC

## 2016-11-17 NOTE — Discharge Summary (Addendum)
Physician Discharge Summary  Hunter [name] WNU:[id number] DOB: [birth year] DOA: 11/05/2016  PCP: Bing Neighbors, FNP  Admit date: 11/05/2016 Discharge date: 11/17/2016  Time spent: 35 minutes  Recommendations for Outpatient Follow-up:  Acute RIGHT HEART failure/Chronic Diastolic CHF -Multifactorial noncompliance with medication, noncompliance with nutrition, sarcoidosis, OSA/OHS untreated. -10/4 S/P RIGHT HEART catheterization: See results below.  -Strict in and out since admission -34.1 L -Daily weight Filed Weights   11/15/16 0451 11/16/16 0500 11/17/16 0500  Weight: 253 lb 1.6 oz (114.8 kg) 253 lb 12.8 oz (115.1 kg) 251 lb 12.8 oz (114.2 kg)  -patient instructed to weigh himself daily and record daily and record in journal. Patient to weigh himself when he gets home on his scale and record as his base weight (~ 114.2 kg) -Torsemide 40 mg daily   Acute on Chronic Respiratory Failure with Hypoxia and Hypercapnia -Noncompliant with home O2. Emphasized importance of continuing daily O2 therapy. Continue  2-3 L O2 via Saxis -Titrate O2 to maintain SPO2> 93% -BiPAP QHS and PRN per [facility] M -See  OSA/OHS  OSA/OHS -Although no official sleep study patient clearly has signs and symptoms consistent with OSA/OHS to include hypercarbia  -schedule appointment with Dr. Levy Pupa PCCM in 1-2 weeks, OSA/OHS, sarcoidosis of lung,requires spirometry pre-/post bronchodilator, DLCO and sleep study.  Loculated RIGHT Pleural Effusion -By [facility] 10/1 And on repeat PCXR 10/4 small partially loculated right pleural effusion. Repeat PCXR on 10/4.. 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  -Trending creatinine     Recent Labs Lab 11/12/16 0415 11/13/16 0442 11/14/16 0420 11/15/16 0336  11/16/16 0304 11/17/16 0417  CREATININE 1.32* 1.11 1.40* 1.55* 1.51* 1.36*  -significantly improved with diuresis. Most likely new baseline. -schedule  follow-up with Nephrology Dr.Lauri Schertz in 1-2 weeks acute on chronic renal failure noncompliance   Pre-Diabetes -Per kidney biopsy early diabetic glomerulopathy  -10/3 Hemoglobin A1c= 6.3. Patient meets criteria for prediabetes. -Per Diabetic Nutritionist note patient uninterested in diabetic education. Again emphasized to patient his need for total lifestyle change in order to maximize his survivability. -Heart healthy/carb modified diet, 2000-calorie per day   HLD -Lipid panel not within ADA guidelines -Lipitor 40 mg daily     Sarcoidosis of lung -Has followed with pulmonology in the past, now off prednisone -HRCT chest; chest chronic changes of esophagus along see results below -See OSA/OHS    Morbid obesity -Body mass index is 41.09 kg/m.    Homelessness -LCSW to help with placement     Left ankle gout -Uric acid 11.9.   -PCP to monitor. PCP to start medication   Hypomagnesemia -Magnesium goal> 2      Discharge Diagnoses:  Active Problems:   Morbid obesity (HCC)   CHF (congestive heart failure) (HCC)   Acute hypercapnic respiratory failure (HCC)   Pulmonary edema   Restrictive lung disease secondary to obesity   Obesity hypoventilation syndrome (HCC)   OSA (obstructive sleep apnea)   Acute on chronic respiratory failure with hypoxia and hypercapnia (HCC)   Pressure injury of skin   RVF (right ventricular failure) (HCC)   Orthostatic hypotension   Generalized edema   Discharge Condition:stable  Diet recommendation: heart healthy/American diabetic Association 2000 cal per day.  Filed Weights   11/15/16 0451 11/16/16 0500 11/17/16 0500  Weight: 253 lb 1.6 oz (114.8 kg) 253 lb  12.8 oz (115.1 kg) 251 lb 12.8 oz (114.2 kg)    History of present illness:  46 y.o. BM PMHx Sarcoidosis, Chronic  Respiratory Failure with Hypoxia (on home O2 noncompliant), HTN, FSGS, CKD stage III. Tobacco abuse, OSA, Obesity Hypoventilation syndrome,   He is a very poor historian. He states that he has been following with nephrology and was on Lasix twice a day. This was recently changed in the past few days by his new primary care physician. He states that since his medications have been changed, he noted decrease in urine output, worsening shortness of breath as well as increased swelling of his abdomen and lower extremities. He is very upset his primary care physician on why his medications have been changed. On chart review of his office records on 9/25, it appears that patient had already been complaining of abdominal bloating for a week at that time despite taking furosemide. His primary care physician had changed his medication from furosemide to torsemide. He is unsure what his kidney diseases from, unsure if he has ever been diagnosed with heart failure. He is supposed to be on home oxygen at baseline, but due to being at a homeless shelter, has not been using it in the past month. Labs were obtained which revealed BNP 1019. Chest x-ray revealed findings consistent with congestive heart failure. On morning of 9/29, he was found to be somnolent and was placed on BiPAP. He has been on lasix gtt with diuresis. He has been evaluated by PCCM, cardiology, nephrology. Cardiology has recommended transfer to [facility] for advanced heart failure team consultation.  During this hospitalization patient treated foracute right heart failure + chronic diastolic CHF, acute on chronic respiratory failure with hypoxia and hypercapniaall exacerbated by noncompliance with treatment/medication. With aggressive diuresis control of blood pressure patient has responded well.  Patient most likely with OSA/OHS and requiring further diagnostic/therapeutic modalities however given his very poor understanding of disease process and  noncompliance most likely will not do well although patient has been provided with extensive resources.  Stable for discharge     Procedures: 9/29 Echocardiogram: LVEF = 55%-60%. -- Ventricular septum: The contour showed systolic flattening. Changes are consistent with RV pressure overload. - Right atrium: severely dilated. - Atrial septum: The septum bowed from right to left, consistent with increased right atrial pressure. - Pulmonary arteries: Systolic pressure could not be accurately   estimated todue to lack of an adequate TR jet. the dilated RV,   septtal flattening and bowing fo the atrial septum to the right   suggests signififcant elevation of RVSP and RAP. PA peak   pressure: 32 mm Hg (S). 10/4 RIGHT cardiac heart cath:Optimized filling pressures.   2. 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  With PVR not particularly high (3.6 WU), think that he probably would get much benefit from pulmonary vasodilator. 10/4 HRCT Chest: -appearance of the chest appears very similar to prior study 11/25/2014.Findings are likely reflective of chronic sarcoidosis. -No new acute findings. -Severe dilatation of the pulmonic trunk and main pulmonary arteries, suggestive of underlying pulmonary arterial hypertension.-Cardiomegaly.   Consultations: Nephrology Cardiology PCCM     Discharge Exam: Vitals:   11/16/16 1419 11/16/16 2055 11/16/16 2315 11/17/16 0500  BP: 107/79 99/61  121/83  Pulse: 82 85 81 90  Resp: Temp: 98.4 F (36.9 C) 98.3 F (36.8 C)  98 F (36.7 C)  TempSrc: Oral Oral  Oral  SpO2: 99% 98%  99%  Weight:    251 lb 12.8 oz (114.2 kg)  Height:        General: A/O 4, positive acute on chronic respiratory distress Eyes: negative scleral hemorrhage, negative anisocoria, negative icterus Neck:  Negative scars, masses, torticollis, lymphadenopathy, positive JVD Lungs: diffuse poor air movement, negative breath sounds RLL, negative  wheezes or crackles  Cardiovascular: Tachycardic, Regular  rhythm without murmur gallop or rub normal S1 and S2 Abdomen: MORBIDLY OBESE, negative abdominal pain, nondistended, positive soft, bowel sounds, no rebound, no ascites, no appreciable mass Extremities: No significant cyanosis, clubbing, or edema bilateral lower extremities   Discharge Instructions   Allergies as of 11/17/2016      Reactions   Thalidomide Other (See Comments)   Derm,resp      Medication List    STOP taking these medications   furosemide 40 MG tablet Commonly known as:  LASIX   hydrOXYzine 100 MG capsule Commonly known as:  VISTARIL   lisinopril 20 MG tablet Commonly known as:  PRINIVIL,ZESTRIL     TAKE these medications   atorvastatin 40 MG tablet Commonly known as:  LIPITOR Take 1 tablet (40 mg total) by mouth daily at 6 PM. What changed:  medication strength  how much to take  when to take this   loratadine 10 MG tablet Commonly known as:  CLARITIN Take 1 tablet (10 mg total) by mouth daily.   magnesium oxide 400 (241.3 Mg) MG tablet Commonly known as:  MAG-OX Take 1 tablet (400 mg total) by mouth every evening.   torsemide 20 MG tablet Commonly known as:  DEMADEX Take 2 tablets (40 mg total) by mouth daily. What changed:  how much to take      Allergies  Allergen Reactions  . Thalidomide Other (See Comments)    Derm,resp   Follow-up Information    [facility]. Call on 11/23/2016.   Specialty:  Internal Medicine Why:  Hospital Follow Up @ 10:00 am with Joaquin Courts Contact information: [address] [place] [address] [phone]       [facility] Follow up on 11/26/2016.   Specialty:  Cardiology Why:  at 200 pm for post hospital follow up. Please bring all of your medications to your visit. The code for parking is 8000. Contact information: [address] [id number] mc [place] [address] [phone]       [facility]-Home Follow up.   Why:  Registered Nurse, Physical Therapy-[facility] Contact information: [address] [place] [address] [phone]        Leslye Peer, MD. Schedule an appointment as soon as possible for a visit in 2 week(s).   Specialty:  Pulmonary Disease Why:  schedule appointment with PCCM in 1-2 weeks, OSA/OHS, sarcoidosis of lung,requires spirometry pre-/post bronchodilator, DLCO and sleep study. Contact information: [address] [place] [address] [phone]        Delano Metz, MD. Schedule an appointment as soon as possible for a visit in 2 week(s).   Specialty:  Nephrology Why:  schedule  follow-up with Nephrology Dr.Chaynce Schertz in 1-2 weeks acute on chronic renal failure noncompliance Contact information: [address] [place] [address] [phone]            The results of significant diagnostics from this hospitalization (including imaging, microbiology, ancillary and laboratory) are listed below for reference.    Significant Diagnostic Studies: Dg Chest 1 View  Result Date: 11/08/2016 CLINICAL DATA:  Hypercapnic respiratory failure. History of sarcoidosis. EXAM: CHEST 1 VIEW COMPARISON:  November 05, 2016 FINDINGS: There is a moderate partially loculated right pleural effusion. There is increase in interstitial and patchy alveolar edema with increased alveolar opacity primarily on the right. There is cardiomegaly with pulmonary venous hypertension. No adenopathy. No evident bone lesions. IMPRESSION: Evidence of congestive heart failure with increase in edema and slight increase in right pleural effusion. Stable cardiac enlargement. Electronically Signed   By: Bretta Bang III M.D.   On: 11/08/2016 16:25   Dg Chest 2 View  Result Date: 11/05/2016 CLINICAL DATA:  Shortness of Breath EXAM: CHEST  2 VIEW COMPARISON:   February 18, 2016 FINDINGS: There is cardiomegaly with pulmonary venous hypertension. There is a stable focal right pleural effusion. There is interstitial edema. There is patchy airspace opacity in the left lower lobe. No adenopathy. No evident bone lesions. IMPRESSION: Findings consistent with congestive heart failure. Question alveolar edema versus patchy pneumonia left lower lobe. Both entities may exist concurrently. Electronically Signed   By: Bretta Bang III M.D.   On: 11/05/2016 14:26   Ct Chest High Resolution  Result Date: 11/12/2016 CLINICAL DATA:  46 year old male with history of sarcoidosis. No current chest pain or shortness of breath. Low oxygen saturation. EXAM: CT CHEST WITHOUT CONTRAST TECHNIQUE: Multidetector CT imaging of the chest was performed following the standard protocol without intravenous contrast. High resolution imaging of the lungs, as well as inspiratory and expiratory imaging, was performed. COMPARISON:  Chest CT 11/25/2014. FINDINGS: Cardiovascular: Heart size is mildly enlarged. There is no significant pericardial fluid, thickening or pericardial calcification. No atherosclerotic calcifications are noted in the thoracic aorta or the coronary arteries. Markedly dilated pulmonic trunk (4.4 cm in diameter) and central pulmonary arteries, concerning for pulmonary arterial hypertension. Mediastinum/Nodes: No pathologically enlarged mediastinal or hilar lymph nodes. Please note that accurate exclusion of hilar adenopathy is limited on noncontrast CT scans. Lungs/Pleura: Chronic right-sided pleural thickening calcification, similar to the prior study. Trace amount of right-sided pleural fluid. Chronic areas of pleuroparenchymal architectural distortion and linear scarring throughout the periphery of the right lung, and to a lesser extent in the periphery of the left upper lobe. No acute consolidative airspace disease. No suspicious appearing pulmonary nodules or masses.  High-resolution images demonstrate no significant regions of ground-glass attenuation, bronchiectasis or honeycombing. Inspiratory and expiratory imaging is limited by patient motion, but is generally unremarkable. Upper Abdomen: Multiple partially calcified borderline enlarged and mildly enlarged lymph nodes in the upper abdomen, similar to the prior examinations, presumably related to underlying sarcoidosis. Musculoskeletal: There are no aggressive appearing lytic or blastic lesions noted in the visualized portions of the skeleton. IMPRESSION: 1. Overall, the appearance of the chest appears very similar to prior study 11/25/2014, and given the patient's history these findings are likely reflective of chronic sarcoidosis. No new acute findings are noted. 2. Severe dilatation of the pulmonic trunk and main pulmonary arteries, suggestive of underlying pulmonary arterial hypertension. 3. Cardiomegaly. 4. Additional incidental findings, as above. Electronically Signed   By: Trudie Reed M.D.   On: 11/12/2016 10:55   Dg Chest Port 1 View  Result Date: 11/11/2016 CLINICAL DATA:  Follow-up loculated pleural effusion EXAM: PORTABLE CHEST 1 VIEW COMPARISON:  11/08/2016 FINDINGS: Multifocal patchy opacities, right upper lobe and bilateral lower lobe predominant, suggesting mild to moderate interstitial edema versus multifocal pneumonia. Layering small right pleural effusion, grossly unchanged. No pneumothorax. IMPRESSION: Stable multifocal patchy opacities, suggesting mild  to moderate interstitial edema versus multifocal pneumonia. Layering small right pleural effusion, grossly unchanged. Electronically Signed   By: Charline Bills M.D.   On: 11/11/2016 07:39   Dg Abd 2 Views  Result Date: 11/02/2016 CLINICAL DATA:  Acute onset abdominal distention and generalized abdominal pain. EXAM: ABDOMEN - 2 VIEW COMPARISON:  Visualized upper abdomen on CT chest 11/25/2014. Chest x-ray 02/18/2016. No prior abdominal  imaging. FINDINGS: Bowel gas pattern unremarkable without evidence of obstruction or significant ileus. No evidence of free air or significant air-fluid levels on the erect image. Expected stool burden in the colon. No visible opaque urinary tract calculi. Degenerative changes involving the lower thoracic and lumbar spine. Chronic pleuroparenchymal scarring at the base of the right hemithorax, unchanged since the prior chest x-ray. IMPRESSION: No acute abdominal abnormality. Electronically Signed   By: Hulan Saas M.D.   On: 11/02/2016 09:38    Microbiology: Recent Results (from the past 240 hour(s))  MRSA PCR Screening     Status: None   Collection Time: 11/10/16  5:11 AM  Result Value Ref Range Status   MRSA by PCR NEGATIVE NEGATIVE Final    Comment:        The GeneXpert MRSA Assay (FDA approved for NASAL specimens only), is one component of a comprehensive MRSA colonization surveillance program. It is not intended to diagnose MRSA infection nor to guide or monitor treatment for MRSA infections.      Labs: Basic Metabolic Panel:  Recent Labs Lab 11/11/16 0503  11/12/16 0415 11/13/16 0442 11/14/16 0420 11/15/16 0336 11/16/16 0304 11/17/16 0417  NA 143  --  139 138 135 137 137 138  K 3.9  --  3.4* 4.1 4.3 4.6 4.0 4.4  CL 86*  --  76* 76* 77* 78* 84* 85*  CO2 43*  --  >50* >50* 47* 47* 42* 39*  GLUCOSE 70  --  124* 96 96 102* 85 89  BUN 37*  --  43* 40* 49* 55* 60* 57*  CREATININE 1.11  < > 1.32* 1.11 1.40* 1.55* 1.51* 1.36*  CALCIUM 8.7*  --  8.4* 8.6* 8.3* 8.6* 8.4* 8.7*  MG 1.3*  --  1.7 2.1  --   --   --   --   < > = values in this interval not displayed. Liver Function Tests: No results for input(s): AST, ALT, ALKPHOS, BILITOT, PROT, ALBUMIN in the last 168 hours. No results for input(s): LIPASE, AMYLASE in the last 168 hours. No results for input(s): AMMONIA in the last 168 hours. CBC:  Recent Labs Lab 11/11/16 0503 11/11/16 1408 11/17/16 0417  WBC 4.8  4.4 4.3  NEUTROABS 2.6  --   --   HGB 15.6 16.1 15.4  HCT 53.5* 55.8* 53.4*  MCV 84.9 86.9 84.9  PLT 120* 127* 123*   Cardiac Enzymes: No results for input(s): CKTOTAL, CKMB, CKMBINDEX, TROPONINI in the last 168 hours. BNP: BNP (last 3 results)  Recent Labs  11/02/16 0841 11/05/16 1133 11/05/16 1414  BNP 729.6* 1,034.7* 1,019.5*    ProBNP (last 3 results) No results for input(s): PROBNP in the last 8760 hours.  CBG: No results for input(s): GLUCAP in the last 168 hours.     Signed:  Carolyne Littles, MD [facility] [phone] pager

## 2016-11-17 NOTE — Progress Notes (Signed)
CSW provided taxi voucher to [facility] for patient to pick up his car. CSW provided petty cash for patient to get gas. CSW provided [employer] gift card per [employer] and a bus pass to get to the hotel ([employer] [phone] [facility] [place]).  CSW signing off.  Osborne Casco An[place]wan Mchugh LCSWA [phone]

## 2016-11-17 NOTE — Progress Notes (Signed)
SATURATION QUALIFICATIONS: (This note is used to comply with regulatory documentation for home oxygen)  Patient Saturations on Room Air at Rest =96%  Patient Saturations on Room Air while Ambulating = 87%  Patient Saturations on 3 Liters of oxygen while Ambulating = 97%  Please briefly explain why patient needs home oxygen: patient SOB with activity

## 2016-11-17 NOTE — Progress Notes (Signed)
Patient agreed to wear CPAP tonight. Patient requested a large full face mask. RT set CPAP with auto settings 10 max 4 min. 3L O2 bled in. Patient states CPAP is tolerable right now and agrees to wear it as long as tolerated. RT will continue to monitor.

## 2016-11-18 MED FILL — Magnesium Oxide 400mg table: 30 days supply | Qty: 30 | Fill #0

## 2016-11-18 MED FILL — ?ATORVASTATIN 40MG TABLET: 40 | 30 days supply | Qty: 30 | Fill #0

## 2016-11-18 MED FILL — TORSEMIDE 20 MG TABLET: 20 | 30 days supply | Qty: 60 | Fill #0

## 2016-11-23 ENCOUNTER — Ambulatory Visit (INDEPENDENT_AMBULATORY_CARE_PROVIDER_SITE_OTHER): Payer: Self-pay | Admitting: Family Medicine

## 2016-11-23 ENCOUNTER — Encounter: Payer: Self-pay | Admitting: Family Medicine

## 2016-11-23 VITALS — BP 126/80 | HR 90 | Temp 97.4°F | Resp 14 | Ht 72.0 in | Wt 263.0 lb

## 2016-11-23 DIAGNOSIS — M109 Gout, unspecified: Secondary | ICD-10-CM

## 2016-11-23 DIAGNOSIS — I272 Pulmonary hypertension, unspecified: Secondary | ICD-10-CM

## 2016-11-23 DIAGNOSIS — R809 Proteinuria, unspecified: Secondary | ICD-10-CM

## 2016-11-23 DIAGNOSIS — D869 Sarcoidosis, unspecified: Secondary | ICD-10-CM

## 2016-11-23 DIAGNOSIS — I5043 Acute on chronic combined systolic (congestive) and diastolic (congestive) heart failure: Secondary | ICD-10-CM

## 2016-11-23 LAB — POCT URINALYSIS DIP (DEVICE)
BILIRUBIN URINE: NEGATIVE
GLUCOSE, UA: NEGATIVE mg/dL
HGB URINE DIPSTICK: NEGATIVE
KETONES UR: NEGATIVE mg/dL
LEUKOCYTES UA: NEGATIVE
Nitrite: NEGATIVE
Protein, ur: 100 mg/dL — AB
SPECIFIC GRAVITY, URINE: 1.015 (ref 1.005–1.030)
Urobilinogen, UA: 0.2 mg/dL (ref 0.0–1.0)
pH: 7 (ref 5.0–8.0)

## 2016-11-23 LAB — URIC ACID: Uric Acid, Serum: 8.5 mg/dL — ABNORMAL HIGH (ref 4.0–8.0)

## 2016-11-23 MED ORDER — PREDNISONE 20 MG PO TABS: 40.0000 mg | ORAL_TABLET | Freq: Every day | ORAL | 0 refills | Status: DC

## 2016-11-23 MED ORDER — METFORMIN HCL 500 MG PO TABS: 500.0000 mg | ORAL_TABLET | Freq: Two times a day (BID) | ORAL | 3 refills | Status: DC

## 2016-11-23 MED FILL — metFORMIN HCL 500 MG TABS: 500 | 30 days supply | Qty: 60 | Fill #0

## 2016-11-23 NOTE — Progress Notes (Signed)
Patient ID: Hunter [name], male    DOB: May 23, 1970, 46 y.o.   MRN: [id number]  PCP: Hunter Neighbors, FNP  Chief Complaint  Patient presents with  . Follow-up    4 WEEKS    Subjective:  HPI Hunter [name] is a 46 y.o. male presents for hospital follow-up. On 11/05/2016, Hunter [name] was seen in office and was found to have pronounced abdominal distension, dyspnea, and a severely elevated BNP of 1034.7. He was referred to [name] for further evaluation and management. Subsequently he was admitted for acute on chronic CHF exacerbation.  Echo showed LV normal function with preserved EF 55-60%. Right atrium severely dilated and right ventricle systolic function moderately reduced. Right heart cath confirmed mild pulmonary hypertension likely secondary to sarcoidosis. He was also found to have gout of left foot with elevated uric acid level. Reports that bottom of his left foot continues to ache. He was found to be prediabetic and wasn't started on metformin during hospital stay. Today he reports consistent use on home oxygen and BiPAP. He continues to reside in the homeless shelter however has routine monitoring by home health. He reports improvement of shortness of breath. He was diuresis a total of 34.1 liters during his hospital stay. Reports weighing himself daily and compliance with medication therapy. Social History   Social History  . Marital status: Single    Spouse name: N/A  . Number of children: N/A  . Years of education: N/A   Occupational History  . Not on file.   Social History Main Topics  . Smoking status: Current Some Day Smoker    Packs/day: 0.50    Years: 10.00    Types: Cigarettes  . Smokeless tobacco: Never Used  . Alcohol use 1.8 oz/week    3 Cans of beer per week  . Drug use: Unknown  . Sexual activity: Not on file   Other Topics Concern  . Not on file   Social History Narrative  . No narrative on file    Family History  Problem Relation Age of Onset  .  Family history unknown: Yes   Review of Systems See HPI Patient Active Problem List   Diagnosis Date Noted  . Chronic diastolic CHF (congestive heart failure) (HCC)   . Acute right-sided CHF (congestive heart failure) (HCC)   . Pleural effusion, right   . Acute renal failure with acute tubular necrosis superimposed on stage 3 chronic kidney disease (HCC)   . Prediabetes   . Sarcoidosis of lung (HCC)   . Generalized edema   . Orthostatic hypotension 11/14/2016  . RVF (right ventricular failure) (HCC)   . Pressure injury of skin 11/09/2016  . Acute on chronic respiratory failure with hypoxia and hypercapnia (HCC)   . Acute hypercapnic respiratory failure (HCC) 11/06/2016  . Pulmonary edema 11/06/2016  . Restrictive lung disease secondary to obesity 11/06/2016  . Obesity hypoventilation syndrome (HCC) 11/06/2016  . OSA (obstructive sleep apnea) 11/06/2016  . CHF (congestive heart failure) (HCC) 11/05/2016  . HTN (hypertension) 11/02/2016  . Hypersomnia 03/02/2016  . Sarcoidosis 02/18/2016  . Hypoxemia 02/18/2016  . Morbid obesity (HCC) 02/18/2016    Allergies  Allergen Reactions  . Thalidomide Other (See Comments)    Derm,resp    Prior to Admission medications   Medication Sig Start Date End Date Taking? Authorizing Provider  atorvastatin (LIPITOR) 40 MG tablet Take 1 tablet (40 mg total) by mouth daily at 6 PM. 11/17/16   Hunter Dallas, MD  loratadine (  CLARITIN) 10 MG tablet Take 1 tablet (10 mg total) by mouth daily. 11/18/16   Hunter Dallas, MD  magnesium oxide (MAG-OX) 400 (241.3 Mg) MG tablet Take 1 tablet (400 mg total) by mouth every evening. 11/17/16   Hunter Dallas, MD  torsemide (DEMADEX) 20 MG tablet Take 2 tablets (40 mg total) by mouth daily. 11/18/16   Hunter Dallas, MD    Past Medical, Surgical Family and Social History reviewed and updated.    Objective:   Today's Vitals   11/23/16 1013  BP: 126/80  Pulse: 90  Resp: 14  Temp: (!) 97.4 F  (36.3 C)  TempSrc: Oral  SpO2: 95%  Weight: 263 lb (119.3 kg)  Height: 6' (1.829 m)    Wt Readings from Last 3 Encounters:  11/23/16 263 lb (119.3 kg)  11/17/16 251 lb 12.8 oz (114.2 kg)  11/02/16 (!) 303 lb (137.4 kg)   Physical Exam  Constitutional: He is oriented to person, place, and time. He appears well-developed and well-nourished.  HENT:  Head: Normocephalic and atraumatic.  Eyes: Pupils are equal, round, and reactive to light. Conjunctivae and EOM are normal.  Neck: Normal range of motion. Neck supple.  Cardiovascular: Normal rate, regular rhythm, normal heart sounds and intact distal pulses.   Pulmonary/Chest: Effort normal and breath sounds normal.  Abdominal: Soft. Bowel sounds are normal. He exhibits no distension.  Musculoskeletal: Normal range of motion.  Neurological: He is alert and oriented to person, place, and time.  Skin: Skin is warm and dry.  Psychiatric: He has a normal mood and affect. His behavior is normal. Judgment and thought content normal.   Assessment & Plan:  1. Sarcoidosis, stable today. Continue follow-up with pulmonology and cardiology.  2. Acute on chronic combined systolic and diastolic heart failure (HCC) 3. Pulmonary HTN (HCC)-Continue follow-up with pulmonology and cardiology.  4. Proteinuria, unspecified type, continue follow-up with Dr.  Kathrene [name] at [facility]  5. Acute gout of left foot, unspecified cause, rechecking uric acid level. Patient continues to complain of mild left foot pain. Will trial prednisone 40 mg x 5 days. If no improvement, will trial cholchine. 6. Prediabetes, 6.3 A1C. Starting on metformin 500 mg twice daily with meals.  Orders Placed This Encounter  Procedures  . Microalbumin/Creatinine Ratio, Urine  . Brain natriuretic peptide  . CBC with Differential  . COMPLETE METABOLIC PANEL WITH GFR  . Uric Acid  . POCT urinalysis dip (device)    Meds ordered this encounter  Medications  . predniSONE  (DELTASONE) 20 MG tablet    Sig: Take 2 tablets (40 mg total) by mouth daily with breakfast.    Dispense:  10 tablet    Refill:  0    Order Specific Question:   Supervising Provider    Answer:   Hunter [name] [id number]  . metFORMIN (GLUCOPHAGE) 500 MG tablet    Sig: Take 1 tablet (500 mg total) by mouth 2 (two) times daily with a meal.    Dispense:  180 tablet    Refill:  3    Order Specific Question:   Supervising Provider    Answer:   Hunter [name] [[id number]]   Return for care 8 weeks for chronic disease management.   Godfrey Pick. Tiburcio Pea, MSN, FNP-C [facility]  [address] [place], [place] [address] [phone]

## 2016-11-23 NOTE — Patient Instructions (Addendum)
You have prediabetes . I'm starting him on metformin 500 mg twice a day with meals. Medication may cause increased frequency of  bowel movements. This is a normal side effect of medication as well as weight loss.  Continue oxygen and BiPAP therapy. If he experiences any form of illness, please follow-up here at the office immediately or go to the ED.   I recommend a influenza vaccination as well as a pneumococcal. You do have a compromised immune system with all of her chronic health conditions which could predispose to requiring both illnesses.    Please review the following recommendations for preventing heart failure exacerbation:  -Limit fluid intake to no more than 1800 ml per day. -If you experiencing greater than 3 lb weight gain within 24 hours, notify our clinic or if after hours report to the Emergency Department. -It is important to keep your blood pressure under control. Monitor blood pressure at home and if you obtain readings greater than 150/90 consecutively over a period of 3 days, notify me here at the office. -Avoid adding table salt or eating food such as can soup or frozen meals which are high in sodium. This increases fluid retention and is likely to worsen your heart failure. -Read the educational information below thoroughly to aid you in management of your heart failure symptoms.      Heart Failure Heart failure means your heart has trouble pumping blood. This makes it hard for your body to work well. Heart failure is usually a long-term (chronic) condition. You must take good care of yourself and follow your doctor's treatment plan. Follow these instructions at home:  Take your heart medicine as told by your doctor. ? Do not stop taking medicine unless your doctor tells you to. ? Do not skip any dose of medicine. ? Refill your medicines before they run out. ? Take other medicines only as told by your doctor or pharmacist.  Stay active if told by your doctor. The  elderly and people with severe heart failure should talk with a doctor about physical activity.  Eat heart-healthy foods. Choose foods that are without trans fat and are low in saturated fat, cholesterol, and salt (sodium). This includes fresh or frozen fruits and vegetables, fish, lean meats, fat-free or low-fat dairy foods, whole grains, and high-fiber foods. Lentils and dried peas and beans (legumes) are also good choices.  Limit salt if told by your doctor.  Cook in a healthy way. Roast, grill, broil, bake, poach, steam, or stir-fry foods.  Limit fluids as told by your doctor.  Weigh yourself every morning. Do this after you pee (urinate) and before you eat breakfast. Write down your weight to give to your doctor.  Take your blood pressure and write it down if your doctor tells you to.  Ask your doctor how to check your pulse. Check your pulse as told.  Lose weight if told by your doctor.  Stop smoking or chewing tobacco. Do not use gum or patches that help you quit without your doctor's approval.  Schedule and go to doctor visits as told.  Nonpregnant women should have no more than 1 drink a day. Men should have no more than 2 drinks a day. Talk to your doctor about drinking alcohol.  Stop illegal drug use.  Stay current with shots (immunizations).  Manage your health conditions as told by your doctor.  Learn to manage your stress.  Rest when you are tired.  If it is really hot outside: ?  Avoid intense activities. ? Use air conditioning or fans, or get in a cooler place. ? Avoid caffeine and alcohol. ? Wear loose-fitting, lightweight, and light-colored clothing.  If it is really cold outside: ? Avoid intense activities. ? Layer your clothing. ? Wear mittens or gloves, a hat, and a scarf when going outside. ? Avoid alcohol.  Learn about heart failure and get support as needed.  Get help to maintain or improve your quality of life and your ability to care for  yourself as needed. Contact a doctor if:  You gain weight quickly.  You are more short of breath than usual.  You cannot do your normal activities.  You tire easily.  You cough more than normal, especially with activity.  You have any or more puffiness (swelling) in areas such as your hands, feet, ankles, or belly (abdomen).  You cannot sleep because it is hard to breathe.  You feel like your heart is beating fast (palpitations).  You get dizzy or light-headed when you stand up. Get help right away if:  You have trouble breathing.  There is a change in mental status, such as becoming less alert or not being able to focus.  You have chest pain or discomfort.  You faint. This information is not intended to replace advice given to you by your health care provider. Make sure you discuss any questions you have with your health care provider. Document Released: 11/04/2007 Document Revised: 07/03/2015 Document Reviewed: 03/13/2012 Elsevier Interactive Patient Education  2017 [employer].

## 2016-11-24 ENCOUNTER — Telehealth: Payer: Self-pay | Admitting: Physician Assistant

## 2016-11-24 ENCOUNTER — Telehealth: Payer: Self-pay | Admitting: Family Medicine

## 2016-11-24 LAB — COMPLETE METABOLIC PANEL WITH GFR
AG RATIO: 1 (calc) (ref 1.0–2.5)
ALT: 53 U/L — AB (ref 9–46)
AST: 60 U/L — AB (ref 10–40)
Albumin: 3.2 g/dL — ABNORMAL LOW (ref 3.6–5.1)
Alkaline phosphatase (APISO): 181 U/L — ABNORMAL HIGH (ref 40–115)
BUN/Creatinine Ratio: 32 (calc) — ABNORMAL HIGH (ref 6–22)
BUN: 38 mg/dL — AB (ref 7–25)
CALCIUM: 8.7 mg/dL (ref 8.6–10.3)
CO2: 26 mmol/L (ref 20–32)
CREATININE: 1.17 mg/dL (ref 0.60–1.35)
Chloride: 101 mmol/L (ref 98–110)
GFR, EST AFRICAN AMERICAN: 85 mL/min/{1.73_m2} (ref >=60)
GFR, EST NON AFRICAN AMERICAN: 73 mL/min/{1.73_m2} (ref >=60)
GLUCOSE: 139 mg/dL — AB (ref 65–99)
Globulin: 3.2 g/dL (calc) (ref 1.9–3.7)
Potassium: 5 mmol/L (ref 3.5–5.3)
Sodium: 139 mmol/L (ref 135–146)
TOTAL PROTEIN: 6.4 g/dL (ref 6.1–8.1)
Total Bilirubin: 0.8 mg/dL (ref 0.2–1.2)

## 2016-11-24 LAB — CBC WITH DIFFERENTIAL/PLATELET
BASOS PCT: 1 %
Basophils Absolute: 39 cells/uL (ref 0–200)
EOS PCT: 10.2 %
Eosinophils Absolute: 398 cells/uL (ref 15–500)
HCT: 50.1 % — ABNORMAL HIGH (ref 38.5–50.0)
Hemoglobin: 14.7 g/dL (ref 13.2–17.1)
Lymphs Abs: 846 cells/uL — ABNORMAL LOW (ref 850–3900)
MCH: 23.8 pg — ABNORMAL LOW (ref 27.0–33.0)
MCHC: 29.3 g/dL — ABNORMAL LOW (ref 32.0–36.0)
MCV: 81.1 fL (ref 80.0–100.0)
Monocytes Relative: 13.6 %
Neutro Abs: 2087 cells/uL (ref 1500–7800)
Neutrophils Relative %: 53.5 %
PLATELETS: 177 10*3/uL (ref 140–400)
RBC: 6.18 10*6/uL — AB (ref 4.20–5.80)
RDW: 16.8 % — ABNORMAL HIGH (ref 11.0–15.0)
TOTAL LYMPHOCYTE: 21.7 %
WBC: 3.9 10*3/uL (ref 3.8–10.8)
WBCMIX: 530 {cells}/uL (ref 200–950)

## 2016-11-24 LAB — BRAIN NATRIURETIC PEPTIDE: Brain Natriuretic Peptide: 250 pg/mL — ABNORMAL HIGH (ref <100)

## 2016-11-24 LAB — MICROALBUMIN / CREATININE URINE RATIO
Creatinine, Urine: 73 mg/dL (ref 20–320)
MICROALB UR: 44.7 mg/dL
MICROALB/CREAT RATIO: 612 ug/mg{creat} — AB (ref <30)

## 2016-11-24 NOTE — Telephone Encounter (Signed)
THIS MESSAGE IS FROM SHAUNDA MENKE (PHYSICAL THERAPIST) FROM [facility] FOR [facility]: SHE NEEDS VERBAL ORDERS FOR PATIENT TO  HAVE P.T. 1 TIME A WEEK FOR 1 WEEK AND THEN TWICE A WEEK FOR 2 WEEKS. (I DID TELL SHAUNDA THAT SARAH SAW HIM IN June FOR A RED EYE. I TOLD HER THAT IT LOOKS LIKE KIMBERLY HARRIS IS HIS PRIMARY CARE PROVIDER). SHE STILL WANTED ME TO GIVE SARAH THIS MESSAGE AND THAT SHE WOULD TRY TO CONTACT KIMBERLY HARRIS AT THE [facility] AS WELL.  BEST PHONE [phone] [facility](SHAUNDA FROM [facility]) MBC

## 2016-11-24 NOTE — Telephone Encounter (Signed)
Contact patient and request that he come in for a blood pressure check.  Received a call from home health agency that his blood pressure was elevated 152/112.

## 2016-11-25 NOTE — Telephone Encounter (Signed)
Please see note below. 

## 2016-11-25 NOTE — Telephone Encounter (Signed)
Who ordered the PT?  I do not see a referral in our system to PT?

## 2016-11-26 ENCOUNTER — Ambulatory Visit (HOSPITAL_COMMUNITY)
Admit: 2016-11-26 | Discharge: 2016-11-26 | Disposition: A | Discharge status: Home / Self Care | Payer: Medicaid Other | Source: Ambulatory Visit | Attending: Internal Medicine | Admitting: Internal Medicine

## 2016-11-26 ENCOUNTER — Encounter (HOSPITAL_COMMUNITY): Payer: Self-pay

## 2016-11-26 VITALS — BP 130/84 | HR 62 | Wt 265.0 lb

## 2016-11-26 DIAGNOSIS — Z79899 Other long term (current) drug therapy: Secondary | ICD-10-CM | POA: Diagnosis not present

## 2016-11-26 DIAGNOSIS — F17210 Nicotine dependence, cigarettes, uncomplicated: Secondary | ICD-10-CM | POA: Diagnosis not present

## 2016-11-26 DIAGNOSIS — I5032 Chronic diastolic (congestive) heart failure: Secondary | ICD-10-CM | POA: Diagnosis present

## 2016-11-26 DIAGNOSIS — Z7984 Long term (current) use of oral hypoglycemic drugs: Secondary | ICD-10-CM | POA: Diagnosis not present

## 2016-11-26 DIAGNOSIS — I50810 Right heart failure, unspecified: Secondary | ICD-10-CM

## 2016-11-26 DIAGNOSIS — J9612 Chronic respiratory failure with hypercapnia: Secondary | ICD-10-CM | POA: Diagnosis not present

## 2016-11-26 DIAGNOSIS — Z888 Allergy status to other drugs, medicaments and biological substances status: Secondary | ICD-10-CM | POA: Diagnosis not present

## 2016-11-26 DIAGNOSIS — J9611 Chronic respiratory failure with hypoxia: Secondary | ICD-10-CM

## 2016-11-26 DIAGNOSIS — N189 Chronic kidney disease, unspecified: Secondary | ICD-10-CM | POA: Insufficient documentation

## 2016-11-26 DIAGNOSIS — D869 Sarcoidosis, unspecified: Secondary | ICD-10-CM | POA: Diagnosis not present

## 2016-11-26 DIAGNOSIS — I509 Heart failure, unspecified: Secondary | ICD-10-CM

## 2016-11-26 DIAGNOSIS — N049 Nephrotic syndrome with unspecified morphologic changes: Secondary | ICD-10-CM

## 2016-11-26 DIAGNOSIS — I272 Pulmonary hypertension, unspecified: Secondary | ICD-10-CM

## 2016-11-26 DIAGNOSIS — I130 Hypertensive heart and chronic kidney disease with heart failure and stage 1 through stage 4 chronic kidney disease, or unspecified chronic kidney disease: Secondary | ICD-10-CM | POA: Insufficient documentation

## 2016-11-26 DIAGNOSIS — N183 Chronic kidney disease, stage 3 unspecified: Secondary | ICD-10-CM

## 2016-11-26 DIAGNOSIS — Z9981 Dependence on supplemental oxygen: Secondary | ICD-10-CM | POA: Diagnosis not present

## 2016-11-26 LAB — BASIC METABOLIC PANEL
ANION GAP: 8 (ref 5–15)
BUN: 37 mg/dL — ABNORMAL HIGH (ref 6–20)
CO2: 28 mmol/L (ref 22–32)
Calcium: 8.3 mg/dL — ABNORMAL LOW (ref 8.9–10.3)
Chloride: 98 mmol/L — ABNORMAL LOW (ref 101–111)
Creatinine, Ser: 0.98 mg/dL (ref 0.61–1.24)
GLUCOSE: 82 mg/dL (ref 65–99)
POTASSIUM: 6 mmol/L — AB (ref 3.5–5.1)
Sodium: 134 mmol/L — ABNORMAL LOW (ref 135–145)

## 2016-11-26 MED ORDER — TORSEMIDE 20 MG PO TABS: ORAL_TABLET | ORAL | 6 refills | Status: DC

## 2016-11-26 MED ORDER — TORSEMIDE 20 MG PO TABS: 40.0000 mg | ORAL_TABLET | Freq: Every day | ORAL | 6 refills | Status: DC

## 2016-11-26 NOTE — Addendum Note (Signed)
Encounter addended by: Chyrl CivatteBradley, Rector Devonshire G, RN on: 11/26/2016  2:42 PM<BR>    Actions taken: Order list changed

## 2016-11-26 NOTE — Patient Instructions (Signed)
Routine lab work today. Will notify you of abnormal results, otherwise no news is good news!  INCREASE Torsemide to 40 mg (2 tabs) in am and 20 mg (1 tab) in pm  Follow up with Dr. Shirlee LatchMcLean in 6-8 weeks.  _______________________________________________________________  [place] Ridge[place] Code:    Take all medication as prescribed the day of your appointment. Bring all medications with you to your appointment.  Do the following things EVERYDAY: 1) Weigh yourself in the morning before breakfast. Write it down and keep it in a log. 2) Take your medicines as prescribed 3) Eat low salt foods-Limit salt (sodium) to 2000 mg per day.  4) Stay as active as you can everyday 5) Limit all fluids for the day to less than 2 liters

## 2016-11-26 NOTE — Telephone Encounter (Signed)
I do not see PT referral or Home Health referral from our office in chart.

## 2016-11-26 NOTE — Telephone Encounter (Signed)
I am not saying that the patient does not need this (I am sure that he does) but we have not evaluated the patient's need for this.  Please let [facility] know that they need to contact who ever did the referral to them in the 1st place.

## 2016-11-26 NOTE — Progress Notes (Signed)
[facility] Note   Referring Physician: Dr. Donnie Ahoilley Primary Care: Joaquin CourtsKimberly Harris, FNP Primary Cardiologist: Dr. Donnie Ahoilley.   HPI: [name] is a 46 year old male with a past medical history of sarcoidosis, chronic hypoxemic respiratory failure, and focal segmental glomerulosclerosis with nephrotic syndrome. FSGS has been treated in the past with steroids, but this is complicated by compliance as he is homeless. Last chest CT was in 2016 and was suggestive of stage IV pulmonary sarcoidosis.  PFTs in 1/18 showed severe restriction and severe obstruction. Echo showed normal LV EF with D-shaped interventricular septum and RV failure.  Admitted 11/05/16-11/17/16 with acute right heart failure/volume overload. Diuresed  50 pounds with IV Lasix. RHC results below.   Discharge weight was 253 pounds. Says that he continues to feel SOB with walking some days, says he has his good days and bad days. Drinking more than 2L a day some days, trying to avoid sodium. He is compliant with his medications. Denies syncope and presyncope. Denies orthopnea.    RHC 11/11/16 Hemodynamics (mmHg) RA mean 6 RV 46/9 PA 49/20, mean 30 PCWP mean 10 Oxygen saturations: PA 66% AO 93% Cardiac Output (Fick) 5.57  Cardiac Index (Fick) 2.32 PVR 3.6 WU  Echo (9/18): EF 55-60%, septal flattening, dilated RV with moderately decreased systolic function, PASP 32 mmHg.       Past Medical History: 1. Sarcoidosis: Diagnosed by skin biopsy.  - PFTs (1/18): Severe obstruction and severe restriction.  - CT chest (2016): Consistent with stage IV pulmonary sarcoidosis.  2. Chronic hypoxemic respiratory failure: 2L home O2.  Likely due to pulmonary sarcoidosis, ? Role for OHS/OSA.  3. Chronic diastolic CHF/RV failure: Echo (9/18) with EF 55-60%, septal flattening, dilated RV with moderately decreased systolic function.  PA pressure estimation likely inaccurate.  4. HTN 5. Focal segmental glomerulosclerosis:  With nephrotic syndrome.  Followed by Dr. Kathrene BongoGoldsborough.  Has been treated with steroids and Prograf in the past.    Review of Systems: [y] = yes, [ ]  = no   General: Weight gain [ ] ; Weight loss [ ] ; Anorexia [ ] ; Fatigue [ ] ; Fever [ ] ; Chills [ ] ; Weakness [ ]   Cardiac: Chest pain/pressure [ ] ; Resting SOB [ ] ; Exertional SOB [ ] ; Orthopnea [ ] ; Pedal Edema [ ] ; Palpitations [ ] ; Syncope [ ] ; Presyncope [ ] ; Paroxysmal nocturnal dyspnea[ ]   Pulmonary: Cough [ ] ; Wheezing[ ] ; Hemoptysis[ ] ; Sputum [ ] ; Snoring [ ]   GI: Vomiting[ ] ; Dysphagia[ ] ; Melena[ ] ; Hematochezia [ ] ; Heartburn[ ] ; Abdominal pain [ ] ; Constipation [ ] ; Diarrhea [ ] ; BRBPR [ ]   GU: Hematuria[ ] ; Dysuria [ ] ; Nocturia[ ]   Vascular: Pain in legs with walking [ ] ; Pain in feet with lying flat [ ] ; Non-healing sores [ ] ; Stroke [ ] ; TIA [ ] ; Slurred speech [ ] ;  Neuro: Headaches[ ] ; Vertigo[ ] ; Seizures[ ] ; Paresthesias[ ] ;Blurred vision [ ] ; Diplopia [ ] ; Vision changes [ ]   Ortho/Skin: Arthritis [ ] ; Joint pain [ ] ; Muscle pain [ ] ; Joint swelling [ ] ; Back Pain [ ] ; Rash [ ]   Psych: Depression[ ] ; Anxiety[ ]   Heme: Bleeding problems [ ] ; Clotting disorders [ ] ; Anemia [ ]   Endocrine: Diabetes [ ] ; Thyroid dysfunction[ ]    Past Medical History:  Diagnosis Date  . Hypertension   . Sarcoid     Current Outpatient Prescriptions  Medication Sig Dispense Refill  . atorvastatin (LIPITOR) 40 MG tablet Take 1 tablet (40 mg total)  by mouth daily at 6 PM. 30 tablet 0  . loratadine (CLARITIN) 10 MG tablet Take 1 tablet (10 mg total) by mouth daily. 30 tablet 0  . magnesium oxide (MAG-OX) 400 (241.3 Mg) MG tablet Take 1 tablet (400 mg total) by mouth every evening. 30 tablet 0  . metFORMIN (GLUCOPHAGE) 500 MG tablet Take 1 tablet (500 mg total) by mouth 2 (two) times daily with a meal. 180 tablet 3  . predniSONE (DELTASONE) 20 MG tablet Take 2 tablets (40 mg total) by mouth daily with breakfast. 10 tablet 0  . torsemide  (DEMADEX) 20 MG tablet Take 2 tablets (40 mg total) by mouth daily. 60 tablet 0   No current facility-administered medications for this encounter.     Allergies  Allergen Reactions  . Thalidomide Other (See Comments)    Derm,resp      Social History   Social History  . Marital status: Single    Spouse name: N/A  . Number of children: N/A  . Years of education: N/A   Occupational History  . Not on file.   Social History Main Topics  . Smoking status: Current Some Day Smoker    Packs/day: 0.50    Years: 10.00    Types: Cigarettes  . Smokeless tobacco: Never Used  . Alcohol use 1.8 oz/week    3 Cans of beer per week  . Drug use: Unknown  . Sexual activity: Not on file   Other Topics Concern  . Not on file   Social History Narrative  . No narrative on file      Family History  Problem Relation Age of Onset  . Family history unknown: Yes    Vitals:   11/26/16 1400  BP: 130/84  Pulse: 62  SpO2: 92%  Weight: 265 lb (120.2 kg)     PHYSICAL EXAM: General:  Well appearing. No respiratory difficulty HEENT: normal Neck: supple. 8-9 cm JVD. Carotids 2+ bilat; no bruits. No lymphadenopathy or thyromegaly appreciated. Cor: PMI nondisplaced. Regular rate & rhythm. No rubs, gallops or murmurs. Lungs: clear bilaterally. Normal effort.  Abdomen: soft, nontender, nondistended. No hepatosplenomegaly. No bruits or masses. Good bowel sounds. Extremities: no cyanosis, clubbing, rash,1+ pedal edema bilaterally. TED hose in place.  Neuro: alert & oriented x 3, cranial nerves grossly intact. moves all 4 extremities w/o difficulty. Affect pleasant.   ASSESSMENT & PLAN:  1.Chronic hypoxemic/hypercarbic respiratory failure:  - With history of sarcoidosis. Will follow up with Pulmonology.  - Continue home O2.   2. 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 After diuresis, filling pressures looked  good on RHC 11/14/16.  On RHC, he had mild pulmonary hypertension with PVR 3.6 WU.   - Possible component of group 5 PAH vs. Group 3 from OHS/OSA. PVR not particularly high and suspected severe parenchymal disease from sarcoidosis, suspect that there is not going to be much benefit from treatment with selective pulmonary vasodilators.  - Volume elevated slightly on exam. Increase torsemide to 40 mg in the am/20 mg in the pm for 2 days, then go back to taking 40 mg daily.  - Continue oxygen to limit hypoxic pulmonary vasoconstriction.   3. Focal segmental glomerulosclerosis with nephrotic syndrome:  - Follows with Dr. Eliott Nine.   4. CKD - BMET today.   Follow up in 6 weeks with Dr. Shirlee Latch, BMET today.   Little Ishikawa, NP 11/26/16

## 2016-11-27 NOTE — Congregational Nurse Program (Signed)
[facility]. Nurse had previously been unable to locate client in room number given on referral.  Client answered door. Client is alert and ambulating in the room with O2 on 3 L/min via nasal cannula. State explained [facility] program and herself. Informed that SW Clydie BraunKaren would be out later today as she was still on her job.  Client glad to hear that SW would be visiting as he needs assistance with finding housing. Nurse went over discharge summary and noted medications that client had present in the room (Claritin 10mg  prn; Atorvastatin 40mg  I QD @ 6pm; Magnesium Oxide 400mg  I QD; Toresemide 20mg  2 QD am; Ibuprofen 800mg  1 BID. No other meds present.  Nurse noted that client also has a CPAP and he states that he uses this at night.  Client asked about foods that he's been eating which include pizza and salads.  Nurse instructed client on Low Sodium diet and told him that pizza is generally loaded with sodium. We discussed other options and talked about meal planning and reading food labels.  Also the importance of noting serving sizes to determine correct amount of sodium in each food item.  Client voices understanding.  Client has several doctor's appointments coming up and states that he has transportation. B/P 112/80 left arm sitting; 98/78 left arm standing. Instructed client to rise slowly from sitting position, stand briefly before walking.  Lungs clear breath sounds. HR 80 regular. Has on knee high TED hose, slight edema bilaterally. Nurse plans to visit client again next Wednesday. Client had no further questions or concerns. Chiyo Fay D. [facility] MSN RN, CNP [phone].Congregational Nurse Program Note  Date of Encounter: 11/22/2016  Past Medical History: Past Medical History:  Diagnosis Date  . Hypertension   . Sarcoid     Encounter Details:     CNP Questionnaire - 11/22/16 1151      Patient Demographics   Is this a new or existing patient? New   Patient is considered a/an Not Applicable   Race African     Patient Assistance   Location of Patient Assistance [facility] patient   Patient's financial/insurance status Self-Pay (Uninsured)   Uninsured Patient ([place] Card/Care Connects) Yes   Interventions Follow-up/Education/Support provided after completed appt.   Patient referred to apply for the following financial assistance Not Applicable   Food insecurities addressed Provided food gift voucher   Transportation assistance No   Assistance securing medications No   Educational health offerings Cardiac disease;Medications     Encounter Details   Primary purpose of visit Post ED/Hospitalization Visit   Was an Emergency Department visit averted? Not Applicable   Does patient have a medical provider? Yes   Patient referred to Follow up with established PCP   Was a mental health screening completed? (GAINS tool) No   Does patient have dental issues? No   Does patient have vision issues? No   Does your patient have an abnormal blood pressure today? No   Since previous encounter, have you referred patient for abnormal blood pressure that resulted in a new diagnosis or medication change? No   Does your patient have an abnormal blood glucose today? No   Since previous encounter, have you referred patient for abnormal blood glucose that resulted in a new diagnosis or medication change? No   Was there a life-saving intervention made? No

## 2016-11-29 ENCOUNTER — Ambulatory Visit (HOSPITAL_COMMUNITY)
Admission: RE | Admit: 2016-11-29 | Discharge: 2016-11-29 | Disposition: A | Discharge status: Home / Self Care | Payer: Medicaid Other | Source: Ambulatory Visit | Attending: Internal Medicine | Admitting: Internal Medicine

## 2016-11-29 ENCOUNTER — Telehealth (HOSPITAL_COMMUNITY): Payer: Self-pay | Admitting: Cardiology

## 2016-11-29 DIAGNOSIS — I509 Heart failure, unspecified: Secondary | ICD-10-CM | POA: Diagnosis not present

## 2016-11-29 LAB — BASIC METABOLIC PANEL
ANION GAP: 7 (ref 5–15)
BUN: 37 mg/dL — ABNORMAL HIGH (ref 6–20)
CHLORIDE: 99 mmol/L — AB (ref 101–111)
CO2: 34 mmol/L — AB (ref 22–32)
Calcium: 8.8 mg/dL — ABNORMAL LOW (ref 8.9–10.3)
Creatinine, Ser: 1.1 mg/dL (ref 0.61–1.24)
GFR calc non Af Amer: >60 mL/min (ref >60)
GLUCOSE: 93 mg/dL (ref 65–99)
POTASSIUM: 4.3 mmol/L (ref 3.5–5.1)
Sodium: 140 mmol/L (ref 135–145)

## 2016-11-29 NOTE — Telephone Encounter (Signed)
[facility] received verbal order from [facility]

## 2016-11-29 NOTE — Telephone Encounter (Signed)
Abnormal lab results received  k 6.0  Per Suzzette RighterErin Smith  Repeat labs ASAP  Patient aware will return for repeat labs

## 2016-12-02 ENCOUNTER — Encounter: Payer: Self-pay | Admitting: Emergency Medicine

## 2016-12-02 ENCOUNTER — Ambulatory Visit (INDEPENDENT_AMBULATORY_CARE_PROVIDER_SITE_OTHER): Payer: Self-pay | Admitting: Emergency Medicine

## 2016-12-02 VITALS — BP 128/82 | HR 77 | Ht 72.0 in | Wt 261.0 lb

## 2016-12-02 DIAGNOSIS — E669 Obesity, unspecified: Secondary | ICD-10-CM

## 2016-12-02 DIAGNOSIS — J984 Other disorders of lung: Secondary | ICD-10-CM

## 2016-12-02 DIAGNOSIS — D869 Sarcoidosis, unspecified: Secondary | ICD-10-CM

## 2016-12-02 DIAGNOSIS — I2721 Secondary pulmonary arterial hypertension: Secondary | ICD-10-CM

## 2016-12-02 DIAGNOSIS — G4733 Obstructive sleep apnea (adult) (pediatric): Secondary | ICD-10-CM

## 2016-12-02 DIAGNOSIS — E662 Morbid (severe) obesity with alveolar hypoventilation: Secondary | ICD-10-CM

## 2016-12-02 DIAGNOSIS — R0902 Hypoxemia: Secondary | ICD-10-CM

## 2016-12-02 MED ORDER — ALBUTEROL SULFATE HFA 108 (90 BASE) MCG/ACT IN AERS: 2.0000 | INHALATION_SPRAY | Freq: Four times a day (QID) | RESPIRATORY_TRACT | 5 refills | Status: DC | PRN

## 2016-12-02 NOTE — Patient Instructions (Addendum)
We will try starting albuterol. Use 2 puffs as needed to see if this helps your breathing.  We will perform a BiPAP titration study to get your pressures adjusted.  Use your oxygen at 3L/min at all times.  Follow with Dr Delton CoombesByrum in 2 months or sooner if you have any problems.

## 2016-12-02 NOTE — Assessment & Plan Note (Signed)
With principally restrictive lung disease on his pulmonary function testing.  He has a stable CT scan of the chest with some mild scar.  It would be reasonable to do a trial of a bronchodilator.  I had like for him to try albuterol to see if he benefits.  If so then we will consider a scheduled bronchodilator regimen.

## 2016-12-02 NOTE — Assessment & Plan Note (Signed)
Discussed weight loss with him today

## 2016-12-02 NOTE — Assessment & Plan Note (Signed)
Chronic hypoxemic respiratory failure, multifactorial due to obesity hypoventilation syndrome, restrictive lung disease, some contribution of his sarcoidosis and pulmonary hypertension.  Continue his current oxygen at 3 L/min.  We need to ensure that he is adequately oxygenated at night as well.

## 2016-12-02 NOTE — Assessment & Plan Note (Signed)
Multifactorial due to obesity hypoventilation syndrome, diastolic dysfunction, some contribution from sarcoidosis although his disease appears to be quiet at this time.  Important to manage the contributing factors.  Keep him adequately oxygenated.  Manage his volume status as per cardiology and nephrology.

## 2016-12-02 NOTE — Progress Notes (Signed)
Subjective:    Patient ID: Hunter [name], male    DOB: November 25, 1970, 46 y.o.   MRN: [id number]  HPI 46 year old man with a history of biopsy-proven sarcoidosis, skin biopsy 2002, off steroids since '07.  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  He was admitted in September 2018 with right heart failure, significant volume overload and decompensated pulmonary hypertension.  He has had pulmonary function testing 02/12/16 that showed evidence for mixed obstruction and restriction on spirometry, significant restriction on lung volumes.  CT scan performed on 11/11/16 was reviewed by me.  This shows no evidence of mediastinal or hilar lymphadenopathy (no contrast), chronic right-sided pleural thickening with some associated calcification, trace right-sided pleural effusion, chronic areas of linear scarring bilaterally.  No evidence of groundglass, consolidation. He is chronically on 3L/min, was sent home from recent hospitalization with BiPAP, needs a titration study, currently on 15/12. His wt and edema have been stable.   He does have SOB with exertion, even w his O2 on. Has not used BD's  He is in the [employer], is staying at a hotel with their assistance. Medicaid is pending. They have also helped with securing his BiPAP.    Multifactorial PAH Sarcoidosis without parenchymal disease, ? Need BD's OSA/OHS > need PSG asap Secondary PAH > most important issue is nocturnal, daytime O2   R heart cath 11/11/16:  PAP 49/20 (30)  PAOP mean 10 PVR 3.6 Wood units Her cardiac output (Fick) 5.57  Review of Systems  Past Medical History:  Diagnosis Date  . Hypertension   . Sarcoid      Family History  Problem Relation Age of Onset  . Family history  unknown: Yes     Social History   Social History  . Marital status: Single    Spouse name: N/A  . Number of children: N/A  . Years of education: N/A   Occupational History  . Not on file.   Social History Main Topics  . Smoking status: Current Some Day Smoker    Packs/day: 0.50    Years: 10.00    Types: Cigarettes  . Smokeless tobacco: Never Used  . Alcohol use 1.8 oz/week    3 Cans of beer per week  . Drug use: Unknown  . Sexual activity: Not on file   Other Topics Concern  . Not on file   Social History Narrative  . No narrative on file     Allergies  Allergen Reactions  . Thalidomide Other (See Comments)    Derm,resp     Outpatient Medications Prior to Visit  Medication Sig Dispense Refill  . atorvastatin (LIPITOR) 40 MG tablet Take 1 tablet (40 mg total) by mouth daily at 6 PM. 30 tablet 0  . loratadine (CLARITIN) 10 MG tablet Take 1 tablet (10 mg total) by mouth daily. 30 tablet 0  . magnesium oxide (MAG-OX) 400 (241.3 Mg) MG tablet Take 1 tablet (400 mg total) by mouth every evening. 30 tablet 0  . metFORMIN (GLUCOPHAGE) 500 MG tablet Take 1 tablet (500 mg total) by mouth 2 (two) times daily with a meal. 180 tablet 3  . torsemide (DEMADEX) 20 MG tablet Take 2 tablets (40 mg total) by mouth daily. 90 tablet 6  . predniSONE (DELTASONE) 20 MG tablet Take 2 tablets (40 mg  total) by mouth daily with breakfast. 10 tablet 0   No facility-administered medications prior to visit.         Objective:   Physical Exam Vitals:   12/02/16 1117  BP: 128/82  Pulse: 77  SpO2: 93%  Weight: 261 lb (118.4 kg)  Height: 6' (1.829 m)   Gen: Pleasant, well-nourished, in no distress,  normal affect  ENT: No lesions,  mouth clear,  oropharynx clear, no postnasal drip  Neck: No JVD, no TMG, no carotid bruits  Lungs: No use of accessory muscles, no dullness to percussion, clear without rales or rhonchi  Cardiovascular: RRR, heart sounds normal, no murmur or gallops, no  peripheral edema  Musculoskeletal: No deformities, no cyanosis or clubbing  Neuro: alert, non focal  Skin: Warm, no lesions or rashes     Assessment & Plan:  Sarcoidosis (HCC) With principally restrictive lung disease on his pulmonary function testing.  He has a stable CT scan of the chest with some mild scar.  It would be reasonable to do a trial of a bronchodilator.  I had like for him to try albuterol to see if he benefits.  If so then we will consider a scheduled bronchodilator regimen.  Hypoxemia Chronic hypoxemic respiratory failure, multifactorial due to obesity hypoventilation syndrome, restrictive lung disease, some contribution of his sarcoidosis and pulmonary hypertension.  Continue his current oxygen at 3 L/min.  We need to ensure that he is adequately oxygenated at night as well.  Obesity hypoventilation syndrome (HCC) He was discharged recently from the hospital on BiPAP 15/12, tolerates fairly well although he feels that the pressures are high.  Clearly he needs to have his hypoventilation adequately treated as this is a contributor to his pulmonary hypertension.  We will arrange for a BiPAP titration study, adjust his pressures as indicated.  Restrictive lung disease secondary to obesity Discussed weight loss with him today  Secondary pulmonary arterial hypertension (HCC) Multifactorial due to obesity hypoventilation syndrome, diastolic dysfunction, some contribution from sarcoidosis although his disease appears to be quiet at this time.  Important to manage the contributing factors.  Keep him adequately oxygenated.  Manage his volume status as per cardiology and nephrology.  Levy Pupa, MD, PhD 12/02/2016, 2:11 PM [facility] [phone] or if no answer [phone]

## 2016-12-02 NOTE — Assessment & Plan Note (Signed)
He was discharged recently from the hospital on BiPAP 15/12, tolerates fairly well although he feels that the pressures are high.  Clearly he needs to have his hypoventilation adequately treated as this is a contributor to his pulmonary hypertension.  We will arrange for a BiPAP titration study, adjust his pressures as indicated.

## 2016-12-07 ENCOUNTER — Telehealth: Payer: Self-pay

## 2016-12-08 ENCOUNTER — Telehealth: Payer: Self-pay | Admitting: Cardiology

## 2016-12-08 NOTE — Telephone Encounter (Signed)
Patient notified and will pick letter up

## 2016-12-08 NOTE — Telephone Encounter (Signed)
Lyla SonCarrie,  Please contact patient to advise I completed his work letter indicating that he has been out of work since November 04, 2016.  He can either pick the letter up here at the office or it can be mailed.  Godfrey PickKimberly S. Tiburcio PeaHarris, MSN, FNP-C [facility]  [address] [place], [place] [address] [phone]

## 2016-12-08 NOTE — Telephone Encounter (Signed)
[facility] stating patient feeling slightly more short of breath than at his baseline. Some chest tightness with breathing. Weight is up 2lbs today. He has been compliant with home medications. Already took dose of 40mg  torsemide this morning. Blood pressure stable. Instructed to take an extra dose this evening and monitor symptoms. ER precautions given. Patient was listening on speaker phone. Agreeable to plan and thankful for follow up call.   Laverda PageLindsay Bridget Westbrooks NP

## 2016-12-12 NOTE — Congregational Nurse Program (Signed)
[facility]. Client had just arrived back to the hotel after being out to pick up medications. Reports doing ojay, only complaint is that of left side of foot.  States that it wakes him up at night and that he usually has to limp to keep it from hurting too bad. Has tramadol ordered but needs to wait to pick it up when he has the money.  Nurse has reviewed clients chart from his visit to the Heart Failure clinic on 10/19. Nurse discussed new meds ordered including Metformin and prednisone as well as the change in his toresomide.  Client states that his weight was up and that he was to take toresomide 40mg  in am and 20mg  in the pm for 2 days and then to resume 40mg  daily. this was completed. Nurse spoke with client about 2000mg  Sodium diet and 2liters fluid/day.  Client feels that he is understanding the low sodium restrictions and that he's doing better with that.  Metformin was ordered as client states that he is pre-diabetic. Client understands to take with food.  O2 remains on 3l/min B/P 124/88 HR78 regular;R-20; lungs are clear; no edema to extremities. Plan:  Client needs another food voucher soon. Nurse will follow up. Next visit 12/08/16.  Hunter [name] D. Azucena Kubaeid MSN, RN CNP [phone] Congregational Nurse Program Note  Date of Encounter: 12/01/2016  Past Medical History: Past Medical History:  Diagnosis Date  . Hypertension   . Sarcoid     Encounter Details: CNP Questionnaire - 12/01/16 2053      Patient Demographics   Is this a new or existing patient?  New    Patient is considered a/an  Not Applicable    Race  African      Patient Assistance   Location of Patient Assistance  [facility] patient    Patient's financial/insurance status  Self-Pay (Uninsured)    Uninsured Patient ([employer] [employer])  Yes    Interventions  Follow-up/Education/Support provided after completed appt.    Patient referred to apply for the following financial assistance  Not  Applicable    Food insecurities addressed  Provided food gift voucher    Transportation assistance  No    Assistance securing medications  No    Educational health offerings  Cardiac disease;Medications;Nutrition      Encounter Details   Primary purpose of visit  Post ED/Hospitalization Visit    Was an Emergency Department visit averted?  Not Applicable    Does patient have a medical provider?  Yes    Patient referred to  Follow up with established PCP    Was a mental health screening completed? (GAINS tool)  No    Does patient have dental issues?  No    Does patient have vision issues?  No    Does your patient have an abnormal blood pressure today?  No    Since previous encounter, have you referred patient for abnormal blood pressure that resulted in a new diagnosis or medication change?  No    Does your patient have an abnormal blood glucose today?  No    Since previous encounter, have you referred patient for abnormal blood glucose that resulted in a new diagnosis or medication change?  No    Was there a life-saving intervention made?  No

## 2016-12-12 NOTE — Congregational Nurse Program (Signed)
[facility]  Client reports not feeling well today has been slightly SOB and also with some chest tightness.  Reports that he's been wearing his CPAP at night as ordered and o2 remains on 3l/min.  No apparent distress noted as client talked for 10 minutes straight. Nurse weighed patient at 267.2 he thinks he may have weighed 262 at last doctors visit. Lungs sounds are clear, HR 78 and regular B/P 120/82, ted hose removed to observe for edema which was slight in left foot.  He has had a cough that has been non-productive.  Reports that he has been careful to avoid too much sodium or liquids.  T.C to CalvinLindsay with Dr. Kathlyn SacramentoMcLeans office and reported the above information. Lillia AbedLindsay states that clients weight was actually 265 lbs at last visit so weight gain of 2.2lbs. Lillia AbedLindsay instructed client (nurse had phone on speaker) to take an extra 40mg  of toresomide this pm and if not feeling better tomorrow to call for an appt. to be seen.  Client voices understanding.  Client continues to complain of left foot hurting on the side, he has still not filled tramadol prescription.  Nurse provided client with a $100.00 wlamart card. He plans to buy food and get prescription filled. Client very concerned about living situation and is inquiring about extension at hotel. Nurse informed him on Nov. 15 being last day, however [facility].  SW Clydie BraunKaren has been working with client re: housing. Plan:  Next visit Wednesday Nov.7  Client to call CNP if needed. Fabian Walder D. Azucena Kubaeid MSN, RN CNP [phone]  Congregational Nurse Program Note  Date of Encounter: 12/08/2016  Past Medical History: Past Medical History:  Diagnosis Date  . Hypertension   . Sarcoid     Encounter Details: CNP Questionnaire - 12/08/16 2118      Patient Demographics   Is this a new or existing patient?  New    Patient is considered a/an  Not Applicable    Race  African      Patient  Assistance   Location of Patient Assistance  [facility] patient    Patient's financial/insurance status  Self-Pay (Uninsured)    Uninsured Patient ([employer] Connects)  Yes    Interventions  Follow-up/Education/Support provided after completed appt.    Patient referred to apply for the following financial assistance  Not Applicable    Food insecurities addressed  Provided food gift voucher    Transportation assistance  No    Assistance securing medications  No    Educational health offerings  Cardiac disease;Medications;Nutrition      Encounter Details   Primary purpose of visit  Post ED/Hospitalization Visit    Was an Emergency Department visit averted?  Yes    Does patient have a medical provider?  Yes    Patient referred to  Follow up with established PCP    Was a mental health screening completed? (GAINS tool)  No    Does patient have dental issues?  No    Does patient have vision issues?  No    Does your patient have an abnormal blood pressure today?  No    Since previous encounter, have you referred patient for abnormal blood pressure that resulted in a new diagnosis or medication change?  No    Does your patient have an abnormal blood glucose today?  No    Since previous encounter, have you referred patient for abnormal blood  glucose that resulted in a new diagnosis or medication change?  No    Was there a life-saving intervention made?  No

## 2016-12-15 ENCOUNTER — Telehealth: Payer: Self-pay | Admitting: Pediatric Intensive Care

## 2016-12-15 MED FILL — TORSEMIDE 20 MG TABLET: 20 | 30 days supply | Qty: 90 | Fill #0

## 2016-12-15 NOTE — Telephone Encounter (Signed)
[facility]. Client concerned that he will not get medical follow through. CN will contact CN director regarding [facility] placement for client.

## 2016-12-15 NOTE — Congregational Nurse Program (Signed)
[facility]   Client moving about in room.  No apparent distress.  Wearing O2 on 3L/min via nasal cannula. Talks to nurse about a nose bleed he had over the weekend.  [facility] will be sending out a humidifier for O2.  Nurse explained to client how the O2 and CPAP could be drying out blood vessels inside nostril and causing them to crack and easily bleed.  Client no longer has a car due to inability to make payment.  Nurse bought taxi vouchers for client to use for medical appointments and explained to client how they should be used.  Client very grateful as he states he is unable to walk to far without becoming SOB.  Left foot pain has improved, he states that he never got the tramadol.  B/P 130/84 HR78 R-22 Lungs are clear, slight edema in lower extremities Client states that his weight this morning was 260 and has been holding there for past few days. States that he has been doing fairly well not having too much sodium in his diet.  Uses [employer] for seasoning food.   Plan:  SW continues to work with client on plan for housing. Will provide him with a list.  SW explained to cleitn that he needs to have some form of income before anyone will accept an application.  Next visit Wednesday Nov. 14. Zohaib Heeney D. Azucena Kubaeid MSN, RN CNP  [phone] Congregational Nurse Program Note  Date of Encounter: 11/27/2016  Past Medical History: Past Medical History:  Diagnosis Date  . Hypertension   . Sarcoid     Encounter Details: CNP Questionnaire - 12/15/16 2122      Questionnaire   Patient Status  Not Applicable    Race  Black or African American    Location Patient Served At  [facility] patient    Uninsured  Uninsured (Subsequent visits/quarter)    Food  Within past 12 months, worried food would run out with no money to buy more    Housing/Utilities  No permanent housing    Transportation  Yes, need transportation assistance    Interpersonal Safety  Yes, feel physically  and emotionally safe where you currently live    Medication  No medication insecurities    Medical Provider  Yes    Referrals  [employer] [employer] officer, [employer] [employer]    ED Visit Averted  Yes

## 2016-12-16 ENCOUNTER — Other Ambulatory Visit: Payer: Self-pay | Admitting: Family Medicine

## 2016-12-16 MED FILL — ?METFORMIN HCL 500MG TABLET: 500 | 30 days supply | Qty: 60 | Fill #1

## 2016-12-17 ENCOUNTER — Other Ambulatory Visit: Payer: Self-pay | Admitting: Family Medicine

## 2016-12-17 ENCOUNTER — Telehealth (HOSPITAL_COMMUNITY): Payer: Self-pay | Admitting: Cardiology

## 2016-12-17 MED ORDER — ATORVASTATIN CALCIUM 40 MG PO TABS: 40.0000 mg | ORAL_TABLET | Freq: Every day | ORAL | 0 refills | Status: DC

## 2016-12-17 MED ORDER — MAGNESIUM OXIDE 400 (241.3 MG) MG PO TABS: 400.0000 mg | ORAL_TABLET | Freq: Every evening | ORAL | 2 refills | Status: DC

## 2016-12-17 MED ORDER — ATORVASTATIN CALCIUM 40 MG PO TABS: 40.0000 mg | ORAL_TABLET | Freq: Every day | ORAL | 2 refills | Status: DC

## 2016-12-17 MED ORDER — MAGNESIUM OXIDE 400 (241.3 MG) MG PO TABS: 400.0000 mg | ORAL_TABLET | Freq: Every evening | ORAL | 0 refills | Status: DC

## 2016-12-17 MED FILL — MAGNESIUM OXIDE 400 MG TAB: 400 (240 MG | 30 days supply | Qty: 30 | Fill #0

## 2016-12-17 MED FILL — ?ATORVASTATIN 40MG TABLET: 40 | 30 days supply | Qty: 30 | Fill #0

## 2016-12-17 NOTE — Addendum Note (Signed)
Addended by: Lisabeth PickHENDRICKS, Eytan Carrigan L on: 12/17/2016 02:55 PM   Modules accepted: Orders

## 2016-12-17 NOTE — Progress Notes (Signed)
Refilled Magnesium and atorvastatin

## 2016-12-17 NOTE — Telephone Encounter (Signed)
Patient notified

## 2016-12-17 NOTE — Telephone Encounter (Signed)
Pt called back.  He is rescheduled to 02/15/17 with Dr. Shirlee LatchMcLean.

## 2016-12-17 NOTE — Telephone Encounter (Signed)
Contact patient to advise he should not take NSAIDs such as ibuprofen due to his kidneys and heart failure. He can take acetaminophen (tylenol)  650 mg up to 4 times daily as needed for pain.

## 2016-12-17 NOTE — Telephone Encounter (Signed)
Left message for patient to call back, need to reschedule 01/14/17 appt with Dr. McLean °

## 2016-12-20 ENCOUNTER — Ambulatory Visit (HOSPITAL_BASED_OUTPATIENT_CLINIC_OR_DEPARTMENT_OTHER): Payer: Medicaid Other | Attending: Emergency Medicine | Admitting: Internal Medicine

## 2016-12-20 ENCOUNTER — Encounter (HOSPITAL_BASED_OUTPATIENT_CLINIC_OR_DEPARTMENT_OTHER): Payer: Self-pay

## 2016-12-20 ENCOUNTER — Telehealth: Payer: Self-pay | Admitting: Emergency Medicine

## 2016-12-20 VITALS — Ht 72.0 in | Wt 262.0 lb

## 2016-12-20 DIAGNOSIS — G4733 Obstructive sleep apnea (adult) (pediatric): Secondary | ICD-10-CM

## 2016-12-20 DIAGNOSIS — G4761 Periodic limb movement disorder: Secondary | ICD-10-CM | POA: Insufficient documentation

## 2016-12-20 DIAGNOSIS — G4736 Sleep related hypoventilation in conditions classified elsewhere: Secondary | ICD-10-CM | POA: Diagnosis not present

## 2016-12-20 NOTE — Telephone Encounter (Signed)
Pt is scheduled for Bipap titration tonight per RB's order. Called and spoke to Kia with [facility] sleep study. Kia states that pt should not even be scheduled for a Bipap titration at this time anyway. Because the pt has not had a sleep study showing he has failed CPAP then insurance will not cover a bipap titration or bipap machine through a DME company. Kia states pt should be scheduled for a split night then can be schedule for a bipap titration. Dr. Delton CoombesByrum is unavailable and will not be back until tomorrow, however pt has study tonight. (After order is changed, will need to contact the pt to inform him of the change and the reason for the change).  Dr. Sherene SiresWert please advise if it is ok to change study to split night. Thanks.

## 2016-12-20 NOTE — Telephone Encounter (Addendum)
[facility] [phone]) and she states pt can keep his current appt and they can change the appt type from Bipap titration to split night. New order has been placed. Called and spoke to pt. Informed him of the change in orders. Pt verbalized understanding and denied any further questions or concerns at this time.   Will forward to RB as FYI.

## 2016-12-20 NOTE — Telephone Encounter (Signed)
Yes change to split night study

## 2016-12-21 ENCOUNTER — Emergency Department (HOSPITAL_COMMUNITY)
Admission: EM | Admit: 2016-12-21 | Discharge: 2016-12-21 | Disposition: A | Discharge status: Home / Self Care | Payer: Medicaid Other | Attending: Emergency Medicine | Admitting: Emergency Medicine

## 2016-12-21 ENCOUNTER — Emergency Department (HOSPITAL_BASED_OUTPATIENT_CLINIC_OR_DEPARTMENT_OTHER)
Admit: 2016-12-21 | Discharge: 2016-12-21 | Disposition: A | Discharge status: Home / Self Care | Payer: Medicaid Other | Attending: Emergency Medicine | Admitting: Emergency Medicine

## 2016-12-21 ENCOUNTER — Other Ambulatory Visit: Payer: Self-pay

## 2016-12-21 ENCOUNTER — Encounter (HOSPITAL_COMMUNITY): Payer: Self-pay | Admitting: Emergency Medicine

## 2016-12-21 ENCOUNTER — Emergency Department (HOSPITAL_COMMUNITY): Payer: Medicaid Other

## 2016-12-21 DIAGNOSIS — Z7984 Long term (current) use of oral hypoglycemic drugs: Secondary | ICD-10-CM | POA: Insufficient documentation

## 2016-12-21 DIAGNOSIS — Z79899 Other long term (current) drug therapy: Secondary | ICD-10-CM | POA: Diagnosis not present

## 2016-12-21 DIAGNOSIS — R2242 Localized swelling, mass and lump, left lower limb: Secondary | ICD-10-CM | POA: Diagnosis present

## 2016-12-21 DIAGNOSIS — F17210 Nicotine dependence, cigarettes, uncomplicated: Secondary | ICD-10-CM | POA: Insufficient documentation

## 2016-12-21 DIAGNOSIS — M7989 Other specified soft tissue disorders: Secondary | ICD-10-CM

## 2016-12-21 DIAGNOSIS — I5032 Chronic diastolic (congestive) heart failure: Secondary | ICD-10-CM | POA: Insufficient documentation

## 2016-12-21 DIAGNOSIS — M79605 Pain in left leg: Secondary | ICD-10-CM

## 2016-12-21 DIAGNOSIS — I110 Hypertensive heart disease with heart failure: Secondary | ICD-10-CM | POA: Insufficient documentation

## 2016-12-21 DIAGNOSIS — M79609 Pain in unspecified limb: Secondary | ICD-10-CM

## 2016-12-21 DIAGNOSIS — R600 Localized edema: Secondary | ICD-10-CM

## 2016-12-21 HISTORY — DX: Heart failure, unspecified: I50.9

## 2016-12-21 LAB — CBC WITH DIFFERENTIAL/PLATELET
Basophils Absolute: 0 10*3/uL (ref 0.0–0.1)
Basophils Relative: 0 %
EOS PCT: 4 %
Eosinophils Absolute: 0.2 10*3/uL (ref 0.0–0.7)
HEMATOCRIT: 47.1 % (ref 39.0–52.0)
Hemoglobin: 13.9 g/dL (ref 13.0–17.0)
LYMPHS ABS: 0.8 10*3/uL (ref 0.7–4.0)
LYMPHS PCT: 15 %
MCH: 25.2 pg — AB (ref 26.0–34.0)
MCHC: 29.5 g/dL — ABNORMAL LOW (ref 30.0–36.0)
MCV: 85.3 fL (ref 78.0–100.0)
MONO ABS: 0.8 10*3/uL (ref 0.1–1.0)
Monocytes Relative: 15 %
Neutro Abs: 3.5 10*3/uL (ref 1.7–7.7)
Neutrophils Relative %: 66 %
PLATELETS: 138 10*3/uL — AB (ref 150–400)
RBC: 5.52 MIL/uL (ref 4.22–5.81)
RDW: 18.4 % — AB (ref 11.5–15.5)
WBC: 5.3 10*3/uL (ref 4.0–10.5)

## 2016-12-21 LAB — COMPREHENSIVE METABOLIC PANEL
ALBUMIN: 3.2 g/dL — AB (ref 3.5–5.0)
ALT: 71 U/L — AB (ref 17–63)
AST: 71 U/L — AB (ref 15–41)
Alkaline Phosphatase: 253 U/L — ABNORMAL HIGH (ref 38–126)
Anion gap: 8 (ref 5–15)
BUN: 34 mg/dL — AB (ref 6–20)
CHLORIDE: 97 mmol/L — AB (ref 101–111)
CO2: 34 mmol/L — AB (ref 22–32)
CREATININE: 0.92 mg/dL (ref 0.61–1.24)
Calcium: 8.9 mg/dL (ref 8.9–10.3)
GFR calc Af Amer: >60 mL/min (ref >60)
GLUCOSE: 114 mg/dL — AB (ref 65–99)
POTASSIUM: 4.2 mmol/L (ref 3.5–5.1)
Sodium: 139 mmol/L (ref 135–145)
Total Bilirubin: 0.8 mg/dL (ref 0.3–1.2)
Total Protein: 6.9 g/dL (ref 6.5–8.1)

## 2016-12-21 LAB — I-STAT TROPONIN, ED: Troponin i, poc: 0 ng/mL (ref 0.00–0.08)

## 2016-12-21 LAB — BRAIN NATRIURETIC PEPTIDE: B Natriuretic Peptide: 160.7 pg/mL — ABNORMAL HIGH (ref 0.0–100.0)

## 2016-12-21 MED ORDER — FENTANYL CITRATE (PF) 100 MCG/2ML IJ SOLN
50.0000 ug | Freq: Once | INTRAMUSCULAR | Status: AC
  Administered 2016-12-21: 50 ug via INTRAVENOUS
  Filled 2016-12-21: qty 2

## 2016-12-21 MED ORDER — TRAMADOL HCL 50 MG PO TABS: 50.0000 mg | ORAL_TABLET | Freq: Four times a day (QID) | ORAL | 0 refills | Status: DC | PRN

## 2016-12-21 MED FILL — traMADol HCL 50 MG TABS: 50 | 30 days supply | Qty: 60 | Fill #0

## 2016-12-21 NOTE — Progress Notes (Signed)
Left lower extremity venous duplex completed. No evidence of DVT, superficial thrombosis, or Baker's cyst. 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  [employer] [employer] Britanie Harshman, RVS 12/21/2016 8:58 am

## 2016-12-21 NOTE — Discharge Instructions (Signed)
Your workup today showed no evidence of infection.  Your fluid overload appears to have improved since her recent discharge.  Your ultrasound was negative for DVT and there is no evidence of fractures in the leg.  I suspect the pain may be due to musculoskeletal discomfort in the setting of some fluid overload.  Please use your home diuretics and continue watching your sodium intake as you have been.  Please use the pain medicine to help with the discomfort and use the crutches provided.  Please follow-up with your primary care physician in several days for reassessment.  If any symptoms change or worsen, please return to the nearest emergency department.

## 2016-12-21 NOTE — ED Triage Notes (Signed)
Pt reports having swelling in lower legs for the last day. Also hx of CHF and was recently hospitalized for same. Pt reporting some shortness of breath with walking.

## 2016-12-21 NOTE — ED Provider Notes (Signed)
[facility] DEPT Provider Note   CSN: [id number] Arrival date & time: 12/21/16  6203     History   Chief Complaint Chief Complaint  Patient presents with  . Leg Swelling    HPI Hunter [name] is a 46 y.o. male.  The history is provided by the patient and medical records. No language interpreter was used.  Leg Pain   This is a recurrent problem. The current episode started yesterday. The problem occurs constantly. The problem has not changed since onset.The pain is present in the left foot and left ankle. The quality of the pain is described as constant. The pain is at a severity of 10/10. The pain is severe. Pertinent negatives include no numbness, no stiffness and no tingling. The symptoms are aggravated by standing (walking). He has tried nothing for the symptoms. The treatment provided no relief. There has been a history of trauma (twisted it several weeks ago).  Shortness of Breath  This is a recurrent problem. The average episode lasts 1 week. The problem occurs intermittently.The current episode started more than 1 week ago. The problem has not changed since onset.Associated symptoms include leg pain and leg swelling (left). Pertinent negatives include no fever, no headaches, no rhinorrhea, no neck pain, no cough, no sputum production, no hemoptysis, no wheezing, no chest pain, no syncope, no vomiting and no abdominal pain. He has tried nothing for the symptoms. He has had prior hospitalizations. Associated medical issues include heart failure. Associated medical issues do not include pneumonia, PE, DVT or recent surgery.    Past Medical History:  Diagnosis Date  . CHF (congestive heart failure) ([place])   . Hypertension   . Sarcoid     Patient Active Problem List   Diagnosis Date Noted  . Secondary pulmonary arterial hypertension () 12/02/2016  . Chronic diastolic CHF (congestive heart failure) ([place])   . Acute right-sided CHF (congestive heart  failure) ([place])   . Pleural effusion, right   . Acute renal failure with acute tubular necrosis superimposed on stage 3 chronic kidney disease ([place])   . Prediabetes   . Sarcoidosis of lung ([place])   . Generalized edema   . Orthostatic hypotension 11/14/2016  . RVF (right ventricular failure) ([place])   . Pressure injury of skin 11/09/2016  . Acute on chronic respiratory failure with hypoxia and hypercapnia (HCC)   . Pulmonary edema 11/06/2016  . Restrictive lung disease secondary to obesity 11/06/2016  . Obesity hypoventilation syndrome ([place]) 11/06/2016  . OSA (obstructive sleep apnea) 11/06/2016  . CHF (congestive heart failure) ([place]) 11/05/2016  . HTN (hypertension) 11/02/2016  . Hypersomnia 03/02/2016  . Sarcoidosis 02/18/2016  . Hypoxemia 02/18/2016  . Morbid obesity ([place]) 02/18/2016    History reviewed. No pertinent surgical history.     Home Medications    Prior to Admission medications   Medication Sig Start Date End Date Taking? Authorizing Provider  albuterol (PROVENTIL HFA;VENTOLIN HFA) 108 (90 Base) MCG/ACT inhaler Inhale 2 puffs into the lungs every 6 (six) hours as needed for wheezing or shortness of breath. 12/02/16   Collene Gobble, MD  atorvastatin (LIPITOR) 40 MG tablet Take 1 tablet (40 mg total) daily at 6 PM by mouth. 12/17/16   Scot Jun, FNP  loratadine (CLARITIN) 10 MG tablet Take 1 tablet (10 mg total) by mouth daily. 11/18/16   Allie Bossier, MD  magnesium oxide (MAG-OX) 400 (241.3 Mg) MG tablet Take 1 tablet (400 mg total) every evening by mouth. 12/17/16  Scot Jun, FNP  metFORMIN (GLUCOPHAGE) 500 MG tablet Take 1 tablet (500 mg total) by mouth 2 (two) times daily with a meal. 11/23/16   Scot Jun, FNP  torsemide (DEMADEX) 20 MG tablet Take 2 tablets (40 mg total) by mouth daily. 11/26/16   Arbutus Leas, NP    Family History Family History  Family history unknown: Yes    Social History Social History   Tobacco Use  .  Smoking status: Current Some Day Smoker    Packs/day: 0.50    Years: 10.00    Pack years: 5.00    Types: Cigarettes  . Smokeless tobacco: Never Used  Substance Use Topics  . Alcohol use: Yes    Alcohol/week: 1.8 oz    Types: 3 Cans of beer per week  . Drug use: Not on file     Allergies   Thalidomide   Review of Systems Review of Systems  Constitutional: Positive for unexpected weight change (9 lb wt gain in 1 month after DC). Negative for chills, diaphoresis, fatigue and fever.  HENT: Negative for congestion and rhinorrhea.   Respiratory: Positive for shortness of breath. Negative for cough, hemoptysis, sputum production, chest tightness, wheezing and stridor.   Cardiovascular: Positive for leg swelling (left). Negative for chest pain, palpitations and syncope.  Gastrointestinal: Negative for abdominal pain, constipation, diarrhea, nausea and vomiting.  Genitourinary: Negative for flank pain and frequency.  Musculoskeletal: Negative for back pain, neck pain, neck stiffness and stiffness.  Neurological: Negative for tingling, light-headedness, numbness and headaches.  Psychiatric/Behavioral: Negative for agitation and behavioral problems.  All other systems reviewed and are negative.    Physical Exam Updated Vital Signs BP (!) 151/89 (BP Location: Left Arm)   Pulse 83   Temp 97.8 F (36.6 C) (Oral)   Resp 19   Ht 6' (1.829 m)   Wt 117.9 kg (260 lb)   SpO2 92%   BMI 35.26 kg/m   Physical Exam  Constitutional: He is oriented to person, place, and time. He appears well-developed and well-nourished. No distress.  HENT:  Head: Normocephalic.  Mouth/Throat: Oropharynx is clear and moist. No oropharyngeal exudate.  Eyes: Conjunctivae and EOM are normal. Pupils are equal, round, and reactive to light.  Neck: Normal range of motion. No JVD present.  Cardiovascular: Normal rate and intact distal pulses.  No murmur heard. Pulmonary/Chest: Effort normal. No tachypnea. No  respiratory distress. He has no wheezes. He has no rhonchi. He has rales. He exhibits no tenderness.  Pt on home 3L  Abdominal: Soft. Bowel sounds are normal. There is no tenderness.  Musculoskeletal: He exhibits edema and tenderness. He exhibits no deformity.       Left ankle: He exhibits swelling. He exhibits no ecchymosis, no deformity, no laceration and normal pulse. Tenderness.       Left foot: There is tenderness and swelling. There is normal capillary refill, no deformity and no laceration.  Tenderness in the left ankle and left foot.  No erythema or evidence of infection.  Palpable DP and PT pulse.  No ulcer seen.  Neurological: He is alert and oriented to person, place, and time. No sensory deficit. He exhibits normal muscle tone.  Skin: Capillary refill takes less than 2 seconds. He is not diaphoretic. No erythema. No pallor.  Nursing note and vitals reviewed.    ED Treatments / Results  Labs (all labs ordered are listed, but only abnormal results are displayed) Labs Reviewed  CBC WITH DIFFERENTIAL/PLATELET - Abnormal;  Notable for the following components:      Result Value   MCH 25.2 (*)    MCHC 29.5 (*)    RDW 18.4 (*)    Platelets 138 (*)    All other components within normal limits  COMPREHENSIVE METABOLIC PANEL - Abnormal; Notable for the following components:   Chloride 97 (*)    CO2 34 (*)    Glucose, Bld 114 (*)    BUN 34 (*)    Albumin 3.2 (*)    AST 71 (*)    ALT 71 (*)    Alkaline Phosphatase 253 (*)    All other components within normal limits  BRAIN NATRIURETIC PEPTIDE - Abnormal; Notable for the following components:   B Natriuretic Peptide 160.7 (*)    All other components within normal limits  I-STAT TROPONIN, ED    EKG  EKG Interpretation None       Radiology Dg Chest 2 View  Result Date: 12/21/2016 CLINICAL DATA:  Lower leg swelling since yesterday exertional shortness of breath. History of CHF with recent hospitalization. EXAM: CHEST  2  VIEW COMPARISON:  Chest x-ray and chest CT scan of November 11, 2016 FINDINGS: The left lung is well-expanded. The interstitial markings are coarse but have improved since the previous study. On the right there is a persistent moderate-sized pleural effusion. The interstitial markings are also coarse but have improved. The cardiac silhouette is mildly enlarged. The pulmonary vascularity is mildly engorged but more distinct today. The mediastinum is normal in width. IMPRESSION: Mild CHF. Moderate sized right pleural effusion, stable. No acute pneumonia. Electronically Signed   By: David  Martinique M.D.   On: 12/21/2016 08:23   Dg Ankle Complete Left  Result Date: 12/21/2016 CLINICAL DATA:  Left leg pain, swelling for a few days. EXAM: LEFT ANKLE COMPLETE - 3+ VIEW COMPARISON:  None. FINDINGS: No acute bony abnormality. Specifically, no fracture, subluxation, or dislocation. Soft tissues are intact. Ossification in the lower interosseous membrane likely related to old injury. IMPRESSION: No acute bony abnormality. Electronically Signed   By: Rolm Baptise M.D.   On: 12/21/2016 08:23   Dg Foot Complete Left  Result Date: 12/21/2016 CLINICAL DATA:  Left leg pain, swelling. EXAM: LEFT FOOT - COMPLETE 3+ VIEW COMPARISON:  12/21/2016 FINDINGS: No acute bony abnormality. Specifically, no fracture, subluxation, or dislocation. Soft tissues are intact. IMPRESSION: Negative. Electronically Signed   By: Rolm Baptise M.D.   On: 12/21/2016 08:24    Procedures Procedures (including critical care time)  Medications Ordered in ED Medications  fentaNYL (SUBLIMAZE) injection 50 mcg (50 mcg Intravenous Given 12/21/16 0904)     Initial Impression / Assessment and Plan / ED Course  I have reviewed the triage vital signs and the nursing notes.  Pertinent labs & imaging results that were available during my care of the patient were reviewed by me and considered in my medical decision making (see chart for details).       Hunter [name] is a 46 y.o. male with a past medical history significant for sarcoidosis, hypertension, and congestive heart failure who presents with slightly worsened exertional shortness of breath, left leg pain, left leg swelling.  Patient says that last month he was admitted for several days for CHF exacerbation.  Notes show the patient had 34 L of fluid removed during diuresis and that stay.  Patient says that since discharge last month he has gained approximate 8 pounds.  He reports that he has had slightly worsened shortness  of breath with exertion but he denies any resting shortness of breath.  He does use 3 L nasal cannula at all times.  Patient says that 2 weeks ago he did twist his ankle on the left.  He reports that he was evaluated and told there is no fracture.  Patient said his pain improved however yesterday his left ankle pain began to worsen.  He describes it as 10 out of 10.  He reports associated left lower leg swelling in the shin, ankle, and foot.  He denies worsened swelling in the right leg.  He denies history of DVT or PE.  He denies any chest pain.  He denies rashes or infections in the leg.  He denies fevers, chills, nausea, vomiting, or change in urine and change in bowel habits.  He has not taken any medicine to help with his discomfort.  He reports that he was in the hospital overnight for a sleep study and then decided to come to the ED to have his leg evaluated.  On exam, patient's lungs do have rales and crackles in the bases bilaterally.  Patient's chest was nontender.  Back nontender.  Abdomen nontender.  Patient's left ankle was tender with manipulation and palpation in both the ankle and the foot.  Patient did have DP and PT pulses present.  Very minimal increase in swelling in the left ankle compared to the right.  Exam otherwise unremarkable.  No infections were seen in the foot.  Given patient's history of CHF, this could be the initial onset of worsening peripheral  edema.  Patient will have BNP and laboratory testing checked.  Due to the crackles in the lungs, chest x-ray will be performed as well as troponin, BNP, and labs.  Patient will have a DVT study given the unilateral leg swelling and pain as well as an x-ray of the ankle to look for previously occult fracture.  Patient will be given pain medicine during workup.  Anticipate reassessment after workup.  Diagnostic testing results seen above.  Troponin negative.  CBC shows continued thrombocytopenia but normal hemoglobin and white blood cell count.  Metabolic panel showed persistently elevated alk phos however creatinine.  BNP improved from prior.  Chest x-ray showed mild CHF but no evidence of worsening pulmonary edema.  Extremity x-rays showed no fracture or other bony abnormality.  Ultrasound was negative for DVT.  Suspect pain from the patient's mild edema in the leg versus musculoskeletal pain from recent ankle being twisted.  Likely sprain.  Patient reports that the tramadol has helped him in the past for muscular skeletal pain.  He will be prescribed a short course.  Patient will be given instructions to follow-up with PCP as well as strict return precautions.  Patient voiced understanding of the plan of care and had no other questions or concerns.  Patient was discharged in good condition.   Final Clinical Impressions(s) / ED Diagnoses   Final diagnoses:  Leg edema  Left leg pain    ED Discharge Orders        Ordered    traMADol (ULTRAM) 50 MG tablet  Every 6 hours PRN     12/21/16 1300      Clinical Impression: 1. Leg edema   2. Left leg pain     Disposition: Discharge  Condition: Good  I have discussed the results, Dx and Tx plan with the pt(& family if present). He/she/they expressed understanding and agree(s) with the plan. Discharge instructions discussed at great length. Strict  return precautions discussed and pt &/or family have verbalized understanding of the instructions.  No further questions at time of discharge.    This SmartLink is deprecated. Use AVSMEDLIST instead to display the medication list for a patient.  Follow Up: Scot Jun, FNP [place] [id number] [phone]     [facility] [place] [phone] [place] [phone]  If symptoms worsen     Tegeler, Gwenyth Allegra, MD 12/21/16 2052

## 2016-12-21 NOTE — ED Notes (Signed)
Patient went to xray and vascular at bedside. Will complete EKG after.

## 2016-12-23 MED FILL — !VENTOLIN HFA INHALER: 108 (90 BAS | 25 days supply | Qty: 18 | Fill #0

## 2016-12-26 DIAGNOSIS — G4733 Obstructive sleep apnea (adult) (pediatric): Secondary | ICD-10-CM

## 2016-12-26 NOTE — Procedures (Signed)
Patient Name: Hunter [name], Hunter [name] Study Date: 12/20/2016 Gender: Male D.O.B: [birth year] Age (years): 46 Referring Provider: Joaquin CourtsKimberly Harris FNP Height (inches): 72 Interpreting Physician: Jetty Duhamellinton Lummie Montijo MD, ABSM Weight (lbs): 262 RPSGT: Armen PickupFord, Evelyn BMI: 36 MRN: [id number] Neck Size: 15.00 CLINICAL INFORMATION Sleep Study Type: NPSG  Indication for sleep study: OSA  Epworth Sleepiness Score: 10  SLEEP STUDY TECHNIQUE As per the AASM Manual for the Scoring of Sleep and Associated Events v2.3 (April 2016) with a hypopnea requiring 4% desaturations.  The channels recorded and monitored were frontal, central and occipital EEG, electrooculogram (EOG), submentalis EMG (chin), nasal and oral airflow, thoracic and abdominal wall motion, anterior tibialis EMG, snore microphone, electrocardiogram, and pulse oximetry.  MEDICATIONS Medications self-administered by patient taken the night of the study : none reported  SLEEP ARCHITECTURE The study was initiated at 10:50:44 PM and ended at 4:43:36 AM.  Sleep onset time was 14.0 minutes and the sleep efficiency was 63.8%. The total sleep time was 225.0 minutes.  Stage REM latency was 19.5 minutes.  The patient spent 7.56% of the night in stage N1 sleep, 60.22% in stage N2 sleep, 13.78% in stage N3 and 18.44% in REM.  Alpha intrusion was absent.  Supine sleep was 30.00%.  RESPIRATORY PARAMETERS The overall apnea/hypopnea index (AHI) was 7.7 per hour. There were 1 total apneas, including 1 obstructive, 0 central and 0 mixed apneas. There were 28 hypopneas and 8 RERAs.  The AHI during Stage REM sleep was 27.5 per hour.  AHI while supine was 13.3 per hour.  The mean oxygen saturation was 92.58%. The minimum SpO2 during sleep was 74.00%.  soft snoring was noted during this study.  CARDIAC DATA The 2 lead EKG demonstrated sinus rhythm. The mean heart rate was 85.29 beats per minute. Other EKG findings include: PVCs.  LEG MOVEMENT  DATA The total PLMS were 428 with a resulting PLMS index of 114.13. Associated arousal with leg movement index was 6.1 .  IMPRESSIONS - Mild obstructive sleep apnea occurred during this study (AHI = 7.7/h). - Insufficient early events and sleep to meet protocol requirements for split CPAP titration. - No significant central sleep apnea occurred during this study (CAI = 0.0/h). - Moderate oxygen desaturation was noted during this study (Min O2 = 74.00%). Supplemental O2 added 1L/ min per protocol at 11:13 PM for persistent low saturation into the 80's. Mean O2 saturation through the study 92.58%. - The patient snored with soft snoring volume. - EKG findings include PVCs. - Severe periodic limb movements of sleep occurred during the study. Associated arousals were significant.  DIAGNOSIS - Obstructive Sleep Apnea (327.23 [G47.33 ICD-10]) - Nocturnal Hypoxemia (327.26 [G47.36 ICD-10]) - Periodic Limb Movement Syndrome (327.51 [G47.61 ICD-10])  RECOMMENDATIONS - Very mild obstructive sleep apnea. CPAP titration might address this and the oxygen desaturation, without needing also to add supplemental oxygen. Otherwise, conservative alternatives such as weight loss, chin strap or oral appliance might be considered.  - Avoid alcohol, sedatives and other CNS depressants that may worsen sleep apnea and disrupt normal sleep architecture. - Sleep hygiene should be reviewed to assess factors that may improve sleep quality. - Weight management and regular exercise should be initiated or continued if appropriate.  [Electronically signed] 12/26/2016 04:19 PM  Jetty Duhamellinton Kavin Weckwerth MD, ABSM Diplomate, American Board of Sleep Medicine   NPI: [id number]                          Jetty Duhamellinton Dorthie Santini Diplomate, American  Board of Sleep Medicine  ELECTRONICALLY SIGNED ON:  12/26/2016, 4:13 PM [facility] PH: [phone]   FX: [phone] ACCREDITED BY THE AMERICAN  ACADEMY OF SLEEP MEDICINE

## 2016-12-27 ENCOUNTER — Encounter (HOSPITAL_COMMUNITY): Payer: Self-pay | Admitting: *Deleted

## 2016-12-27 NOTE — Progress Notes (Signed)
[facility] 12/18/2016 CASE # [id number]  Requested records faxed today to [phone].  Original request will be scanned to patient's electronic medical record.

## 2017-01-02 NOTE — Congregational Nurse Program (Signed)
[facility]  Nurse picked up a gift card for client to purchase food with on the way to visit.  Also requested by phone and e-mail for an extension at motel per pt. Request..  Client reports that his feet are feeling better.  Weight down to 257 as he states he hasn't been eating as much.  Reports that he gets SOB whenever he's out walking around without his oxygen on or trying to lift anything.  Client states that he is supposed to wear O2 3L.min all of the time.  Nurse advised client to wear oxygen as ordered and to speak with doctor about not wearing 24/7 also advised against lifting anything as this exerts a lot of energy.  Client reports that when he used the ventolin a couple days ago after this episode of SOB that he noticed that it increased his heart rate.  Nurse informed him that this could be a side effect of the ventolin. B/P 138/86 HR 72; R-20 lungs with slightly diminished breath sounds on right side.  Client denies any SOB today.  SW and client had a long discussion about housing.  Client feels that if he had just another month here at motel that he could secure housing. Lelia phoned during visit and informed that stay was approved for another month.  Absolute last day will be December 27.  Nurse relayed this message to client who was very grateful. Plan:  Next visit in one week. Client to continue watching sodium in diet and also to write down concerns for doctor re: when he might be able to wear oxygen less Client to call 911 with any emergency medical issues. Shuntae Herzig D. Azucena Kubaeid MSN, RN CNP [phone] .Congregational Nurse Program Note  Date of Encounter: 12/28/2016  Past Medical History: Past Medical History:  Diagnosis Date  . CHF (congestive heart failure) (HCC)   . Hypertension   . Sarcoid     Encounter Details: CNP Questionnaire - 12/28/16 1540      Questionnaire   Patient Status  Not Applicable    Race  African    Location  Patient Served At  [facility] patient    Insurance  Not Applicable    Uninsured  Uninsured (Subsequent visits/quarter)    Food  Within past 12 months, food ran out with no money to buy more    Housing/Utilities  No permanent housing    Transportation  Yes, need transportation assistance;Provided transportation assistance (bus pass, taxi voucher, etc.)    Interpersonal Safety  Yes, feel physically and emotionally safe where you currently live    Medication  No medication insecurities    Medical Provider  Yes    Referrals  Not Applicable    ED Visit Averted  Yes    Life-Saving Intervention Made  Yes

## 2017-01-02 NOTE — Congregational Nurse Program (Signed)
[facility] Client reports doing well.  Foot is feeling somewhat better.  States that his doctor had told him it was okay to take Hydrocodone that he had from a previous prescription, however now that he has the tramodol he is taking it instead and it seems to be working for him. Pain is in the left foot inner arch.  Nurse noted slight swelling,  Nurse also noted that nails neede to be clipped and discussed good foot care since he is prediabetic.  Weight is holding at 262 and client reports that he is watching his diet and trying to avoid too much sodium. B/P 122/88; HR 68 and regular; R-18 lungs clear; O2 on 3L/min.  Client now has Ventolin Inhaler of which he uses prn for SOB or wheezing. Has not used it. Plan:  Discussed plan for housing with SW. Client dneies having any relatives or friends in [place] who could take him in.  Nurse explained again to client that [facility] program is not permanent and would soon end.  Client request that nurse ask for hime another extension and also reports that he's about out of food.  Nurse will follow up with [facility] and [facility] re: and extension and will request gift card to assist with food.  Next visit scheduled for one week. Missy Baksh D. Azucena Kubaeid MSN, RN CNP [phone] Congregational Nurse Program Note  Date of Encounter: 12/24/2016  Past Medical History: Past Medical History:  Diagnosis Date  . CHF (congestive heart failure) (HCC)   . Hypertension   . Sarcoid     Encounter Details: CNP Questionnaire - 12/24/16 1509      Questionnaire   Patient Status  Not Applicable    Race  African    Location Patient Served At  [facility]    Uninsured  Uninsured (Subsequent visits/quarter)    Food  Within past 12 months, food ran out with no money to buy more    Housing/Utilities  No permanent housing    Transportation  Yes, need transportation assistance    Interpersonal Safety  Yes, feel physically and emotionally safe  where you currently live    Medical Provider  Yes    ED Visit Averted  Yes

## 2017-01-05 ENCOUNTER — Telehealth: Payer: Self-pay | Admitting: Family Medicine

## 2017-01-05 NOTE — Telephone Encounter (Signed)
[facility] received an incoming call from patient. Pt requested assistance with food insecurity, obtaining eye glasses, and transportation resources.   Pt agreed to come in on 01/06/17 to discuss available resources.

## 2017-01-05 NOTE — Telephone Encounter (Signed)
Pt called wants options for eye glasses and places where he can be accepted within our system.

## 2017-01-06 ENCOUNTER — Ambulatory Visit: Payer: Self-pay | Attending: Internal Medicine | Admitting: Licensed Clinical Social Worker

## 2017-01-06 DIAGNOSIS — F4323 Adjustment disorder with mixed anxiety and depressed mood: Secondary | ICD-10-CM

## 2017-01-06 NOTE — BH Specialist Note (Signed)
Integrated Behavioral Health Initial Visit  MRN: [id number] Name: Hunter GasserRobert Wages  Number of Integrated Behavioral Health Clinician visits:: 1/6 Session Start time: 11:15 AM  Session End time: 11:45 AM Total time: 30 minutes  Type of Service: Integrated Behavioral Health- Individual/Family Interpretor:No. Interpretor Name and Language: N/A   Warm Hand Off Completed.       SUBJECTIVE: Hunter GasserRobert [name] is a 46 y.o. male accompanied by self Patient was referred by FNP Tiburcio PeaHarris for community resources. Patient reports the following symptoms/concerns: anhedonia, feelings of sadness and worry, difficulty sleeping, and low energy  Duration of problem: Couple of weeks; Severity of problem: moderate  OBJECTIVE: Mood: Pleasant and Affect: Appropriate Risk of harm to self or others: No plan to harm self or others  LIFE CONTEXT: Family and Social: Pt [place] family [place] resides [place] of state ([place], [place] He talks on the phone with family often School/Work: Pt is currently unemployed due to medical health. He has applied for unemployment, disability, and medicaid Self-Care: Pt enjoys running errands and taking walks. No report of substance use Life Changes: Pt has ongoing medical concerns that resulted in hospitalization. He is currently unemployed and homeless. Pt is receiving services through the [facility] program   GOALS ADDRESSED: Patient will: 1. Reduce symptoms of: anxiety and depression 2. Increase knowledge and/or ability of: coping skills  3. Demonstrate ability to: Increase adequate support systems for patient/family  INTERVENTIONS: Interventions utilized: Supportive Counseling, Psychoeducation and/or Health Education and Link to [facility]  Standardized Assessments completed: GAD-7 and PHQ 2&9  ASSESSMENT: Patient currently experiencing anxiety and depression triggered by ongoing medical concerns and financial strain. Pt reports anhedonia, feelings of sadness and worry,  difficulty sleeping, and low energy. He has limited support and needs assistance with obtaining long-term housing.   Patient may benefit from psychoeducation and psychotherapy. LCSWA educated pt on the correlation between mental and physical health, in addition, to how stress can negatively impact both. Therapeutic interventions were discussed and pt successfully identified healthy coping skills to decrease symptoms. Pt is receiving housing and nursing assistance through the [employer] program for the next thirty days. LCSWA provided community resources to assist with food insecurity and transportation. A referral to Legal Aid was completed to assist pt with unemployment benefits.  PLAN: 1. Follow up with behavioral health clinician on : Pt was encouraged to contact LCSWA if symptoms worsen or fail to improve to [facility]. 2. Behavioral recommendations: LCSWA recommends that pt apply healthy coping skills discussed and utilize provided resources. Pt is encouraged to schedule follow up appointment with LCSWA 3. Referral(s): [facility]:  Arts administratorood, Interior and spatial designerinances and Transportation 4. "From scale of 1-10, how likely are you to follow plan?": 10/10  Bridgett LarssonJasmine D Lewis, LCSW 01/07/17 9:33 AM

## 2017-01-10 ENCOUNTER — Telehealth: Payer: Self-pay | Admitting: Licensed Clinical Social Worker

## 2017-01-10 ENCOUNTER — Telehealth (HOSPITAL_COMMUNITY): Payer: Self-pay | Admitting: Surgery

## 2017-01-10 ENCOUNTER — Other Ambulatory Visit (INDEPENDENT_AMBULATORY_CARE_PROVIDER_SITE_OTHER): Payer: Self-pay

## 2017-01-10 ENCOUNTER — Ambulatory Visit (INDEPENDENT_AMBULATORY_CARE_PROVIDER_SITE_OTHER): Payer: Self-pay | Admitting: Neurology

## 2017-01-10 ENCOUNTER — Encounter: Payer: Self-pay | Admitting: Neurology

## 2017-01-10 VITALS — BP 118/78 | HR 78 | Ht 72.0 in | Wt 260.4 lb

## 2017-01-10 DIAGNOSIS — R29898 Other symptoms and signs involving the musculoskeletal system: Secondary | ICD-10-CM

## 2017-01-10 NOTE — Telephone Encounter (Signed)
Patient has been referred to HF Community Paramedicine Program.  I have sent all the appropriate referral via secure email to paramedic team. 

## 2017-01-10 NOTE — Patient Instructions (Signed)
1.  Check labs 2.  NCS/EMG of the right arm and leg  We will call you with the results and determine the next step

## 2017-01-10 NOTE — Telephone Encounter (Signed)
CSW received call from Jenel LucksJasmine Lewis, CSW @ [facility] requesting assistance with supportive follow up for patient. Patient followed by the HF clinic and in need of additional need of education and support services. CSW discussed paramedicine program to assist with support and community coordination. CHW CSW will contact patient to inform of referral to program and obtain patient by in. CSW will refer and complete paperwork. CSW will follow up with patient post initial visit with paramedicine. Lasandra BeechJackie Stephen Baruch, LCSW, CCSW-MCS [phone]

## 2017-01-10 NOTE — Progress Notes (Signed)
[facility] Neurology Division Clinic Note - Initial Visit   Date: 01/10/17  Hunter [name] MRN: [id number] DOB: 27-Mar-1970   Dear Dr. Kathrene Bongo:  Thank you for your kind referral of Hunter [name] for consultation of bilateral leg weakness. Although his history is well known to you, please allow [place] to reiterate it for the purpose of our medical record. The patient was accompanied to the clinic by self.    History of Present Illness: Hunter [name] is a 46 y.o. 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    He began having weakness of the leg since around 2016 with difficulty climbing stairs.  He used to have a 10-minute walk into his workplace from his parking lot.  Because of his shortness of breath/fatigue, he would have to come an hour before work to allow for frequent breaks.  He did not have to sit down and did not feel weakness of the legs, but would stand for a few minutes and then walk again.  He used to drive a forklift and reports having difficulty and weakness of the legs with getting in and out of it.  He stopped working on November 02, 2016 and is applying for disability.  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  He is not on any immunotherapy at this time. Steroid-induced myopathy was raised as a possibility by his nephrologist. Despite stopping these medications, he has not had any improvement in lower leg strength.  He was hospitalized in October for acute on chronic heart failure and volume overload.  Following this, he started using crutches because of gout in his left foot and generalized weakness.  Prior to this, he was walking unassisted.  He has not  had any falls.  He denies any pain the in legs, cramps, twitches, and back pain.   He has some arm weakness and stiffness of the shoulder with reaching for objects.  No numbness/tingling of the extremities.  He drinks about 30oz aobut 1-2 times per week.  He stopped smoking in October 2018.   Out-side paper records, electronic medical record, and images have been reviewed where available and summarized as:  CT chest 11/12/2016:   1. Overall, the appearance of the chest appears very similar to prior study 11/25/2014, and given the patient's history these findings are likely reflective of chronic sarcoidosis. No new acute findings are noted. 2. Severe dilatation of the pulmonic trunk and main pulmonary  arteries, suggestive of underlying pulmonary arterial hypertension. 3. Cardiomegaly. 4. Additional incidental findings, as above.  Lab Results  Component Value Date   TSH 1.427 11/06/2016   Lab Results  Component Value Date   HGBA1C 6.3 (H) 11/10/2016     Past Medical History:  Diagnosis Date  . CHF (congestive heart failure) (HCC)   . Hypertension   . Nephrotic syndrome   . Sarcoid     Past Surgical History:  Procedure Laterality Date  . RIGHT HEART CATH N/A 11/11/2016   Procedure: RIGHT HEART CATH;  Surgeon: Laurey Morale, MD;  Location: [facility] INVASIVE CV LAB;  Service: Cardiovascular;  Laterality: N/A;     Medications:  Outpatient Encounter Medications as of 01/10/2017  Medication Sig  . albuterol (PROVENTIL HFA;VENTOLIN HFA) 108 (90 Base) MCG/ACT inhaler Inhale 2 puffs  into the lungs every 6 (six) hours as needed for wheezing or shortness of breath.  Marland Kitchen. atorvastatin (LIPITOR) 40 MG tablet Take 1 tablet (40 mg total) daily at 6 PM by mouth.  . loratadine (CLARITIN) 10 MG tablet Take 1 tablet (10 mg total) by mouth daily.  . magnesium oxide (MAG-OX) 400 (241.3 Mg) MG tablet Take 1 tablet (400 mg total) every evening by mouth.  . metFORMIN (GLUCOPHAGE) 500 MG tablet Take 1 tablet  (500 mg total) by mouth 2 (two) times daily with a meal.  . torsemide (DEMADEX) 20 MG tablet Take 2 tablets (40 mg total) by mouth daily.  . traMADol (ULTRAM) 50 MG tablet Take 1 tablet (50 mg total) every 6 (six) hours as needed by mouth.   No facility-administered encounter medications on file as of 01/10/2017.      Allergies:  Allergies  Allergen Reactions  . Thalidomide Other (See Comments)    Derm,resp    Family History: Family History  Problem Relation Age of Onset  . Asthma Sister   . Hypertension Paternal Grandmother     Social History: Social History   Tobacco Use  . Smoking status: Current Some Day Smoker    Packs/day: 0.50    Years: 10.00    Pack years: 5.00    Types: Cigarettes  . Smokeless tobacco: Never Used  Substance Use Topics  . Alcohol use: Yes    Alcohol/week: 1.8 oz    Types: 3 Cans of beer per week  . Drug use: No   Social History   Social History Narrative   Lives alone.  Currently homeless and staying on the 3rd floor of a hotel with an elevator.     No children.  Education: high school.      Review of Systems:  CONSTITUTIONAL: No fevers, chills, night sweats, or weight loss.   EYES: No visual changes or eye pain ENT: No hearing changes.  No history of nose bleeds.   RESPIRATORY: No cough, wheezing +shortness of breath.   CARDIOVASCULAR: Negative for chest pain, and palpitations.   GI: Negative for abdominal discomfort, blood in stools or black stools.  No recent change in bowel habits.   GU:  No history of incontinence.   MUSCLOSKELETAL: +history of joint pain or swelling.  No myalgias.   SKIN: Negative for lesions, rash, and itching.   HEMATOLOGY/ONCOLOGY: Negative for prolonged bleeding, bruising easily, and swollen nodes.  No history of cancer.   ENDOCRINE: Negative for cold or heat intolerance, polydipsia or goiter.   PSYCH:  No depression or anxiety symptoms.   NEURO: As Above.   Vital Signs:  BP 118/78   Pulse 78   Ht 6'  (1.829 m)   Wt 260 lb 6 oz (118.1 kg)   SpO2 91%   BMI 35.31 kg/m    General Medical Exam:   General:  Well appearing, sitting in wheelchair on oxygen via [place], comfortable.   Eyes/ENT: see cranial nerve examination.   Neck: No masses appreciated.  Full range of motion without tenderness.  No carotid bruits. Respiratory:  Clear to auscultation, good air entry bilaterally.   Cardiac:  Regular rate and rhythm, no murmur.   Extremities:  Left great foot with mild edema, erythema, and tenderness.  Skin:  No rashes or lesions.  Neurological Exam: MENTAL STATUS including orientation to time, place, person, recent and remote memory, attention span and concentration, language, and fund of knowledge is normal.  Speech is not dysarthric.  CRANIAL NERVES:  II:  No visual field defects.  Disc are sharp, otherwise limited fundoscopic exam.   III-IV-VI: Pupils equal round and reactive to light.  Normal conjugate, extra-ocular eye movements in all directions of gaze.  No nystagmus.   V:  Normal facial sensation.   VII:  Normal facial symmetry and movements.   VIII:  Normal hearing and vestibular function.   IX-X:  Normal palatal movement.   XI:  Normal shoulder shrug and head rotation.   XII:  Normal tongue strength and range of motion, no deviation or fasciculation.  MOTOR:  No atrophy, fasciculations or abnormal movements.  No pronator drift.  Tone is normal.    Right Upper Extremity:    Left Upper Extremity:    Deltoid  5/5   Deltoid  5/5   Biceps  5/5   Biceps  5/5   Triceps  5/5   Triceps  5/5   Wrist extensors  5/5   Wrist extensors  5/5   Wrist flexors  5/5   Wrist flexors  5/5   Finger extensors  5/5   Finger extensors  5/5   Finger flexors  5/5   Finger flexors  5/5   Dorsal interossei  5/5   Dorsal interossei  5/5   Abductor pollicis  5/5   Abductor pollicis  5/5   Tone (Ashworth scale)  0  Tone (Ashworth scale)  0   Right Lower Extremity:    Left Lower Extremity:    Hip flexors   4+/5   Hip flexors  4+/5   Hip extensors  5/5   Hip extensors  5/5   Adductor 4/5  Adductor 4/6  Abductor 5/5  Abductor 5/5  Knee flexors  5/5   Knee flexors  5/5   Knee extensors  5/5   Knee extensors  5/5   Dorsiflexors  5/5   Dorsiflexors  5/5   Plantarflexors  5/5   Plantarflexors  5/5   Toe extensors  5/5   Toe extensors  5/5   Toe flexors  5/5   Toe flexors  5/5   Tone (Ashworth scale)  0  Tone (Ashworth scale)  0   MSRs:  Right                                                                 Left brachioradialis 1+  brachioradialis 2+  biceps 1+  biceps 2+  triceps 1+  triceps 2+  patellar 0  patellar 0  ankle jerk 0  ankle jerk 0  Hoffman no  Hoffman no  plantar response down  plantar response down   SENSORY:  Normal and symmetric perception of light touch, pinprick, vibration, and proprioception.   COORDINATION/GAIT: Normal finger-to- nose-finger.  Intact rapid alternating movements bilaterally.  He is able to stand, gait is wide-based unassisted and slow.   IMPRESSION: Mr. 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   Exam shows proximal leg weakness, loss of reflexes in the legs, and normal distal strength/sensation.  Differential for leg weakness is broad and includes lumbosacral radiculopathy, myopathy, neuropathy, deconditioning.  NCS/EMG of the right arm and  leg will be performed to better characterize the nature of his symptoms.  Check CK, aldolase, myasthenia gravis panel.  If there is evidence favoring myopathic process, his statin therapy will need to be stopped and he will need biopsy to help determine if this is steroid-induced, sarcoid-related, or other inflammatory process.   The duration of this appointment visit was 60 minutes of face-to-face time with the patient.   Greater than 50% of this time was spent in counseling, explanation of diagnosis, planning of further management, and coordination of care.   Thank you for allowing me to participate in patient's care.  If I can answer any additional questions, I would be pleased to do so.    Sincerely,    Sundus Pete K. Allena KatzPatel, DO

## 2017-01-10 NOTE — Telephone Encounter (Signed)
01/10/17 8:47 AM LCSWA contacted Lasandra BeechJackie Brennan, LCSW at [facility] to discuss patient care.   Mrs. Fransico MichaelBrennan disclosed that pt is a [facility]. LCSWA agreed to contact pt to inquire if he is open to services.   8:57 AM LCSWA contacted pt via telephone. Pt informed of the Community Para-medicine program at [facility] and discussed benefits of participating in program. Pt agreed to initiate services. No additional concerns noted.

## 2017-01-10 NOTE — Telephone Encounter (Signed)
Opened in error

## 2017-01-11 LAB — CK: CK TOTAL: 670 U/L — AB (ref 7–232)

## 2017-01-13 ENCOUNTER — Telehealth (HOSPITAL_COMMUNITY): Payer: Self-pay

## 2017-01-13 NOTE — Telephone Encounter (Signed)
I called Mr Hunter [name] to schedule an appointment. I explained what the paramedicine program is and that [facility] had [facility]. He said he would be able to meed tomorrow around lunch time and requested a pillbox.

## 2017-01-14 ENCOUNTER — Encounter (HOSPITAL_COMMUNITY): Payer: Self-pay | Admitting: Cardiology

## 2017-01-14 ENCOUNTER — Telehealth: Payer: Self-pay | Admitting: Licensed Clinical Social Worker

## 2017-01-14 ENCOUNTER — Telehealth (HOSPITAL_COMMUNITY): Payer: Self-pay

## 2017-01-14 NOTE — Telephone Encounter (Signed)
I attempted to call Mr Hunter [name] to see if I could come by as we had previously discussed. He was not able to be reached due to a technical issue with the Therapist, artmotel operator.

## 2017-01-14 NOTE — Telephone Encounter (Signed)
Message received from pt requesting paramedicine appointment information. Pt stated that he is unsure of the date and time.   LCSWA called pt back and provided the [facility]. Pt stated that the clinic has shut down due to the inclement weather; however, he will call again beginning of next week. No additional concerns noted.

## 2017-01-15 LAB — ALDOLASE: Aldolase: 9 U/L — ABNORMAL HIGH (ref <=8.1)

## 2017-01-15 LAB — MYASTHENIA GRAVIS PANEL 2: Acetylchol Modul Ab: 20 % Inhibition

## 2017-01-17 NOTE — Congregational Nurse Program (Signed)
Congregational Nurse Program Note  Date of Encounter: 01/14/2017  01/14/17 was actually a telephone call. Nurse made a visit on 01/12/17 and client was not at home. 01/15/17 Re-visited client who reports doing well. His left foot continues to hurt on and off.  He has started taking tramodol every 6 hours and this seems to relieve the pain. States that his visit to the Heart Failure Clinic was canceled and he needs to call on Monday to get it rescheduled. Client received a call from someone about setting up Paramedic visits and he wanted to be sure that it was okay ad wouldn't  Interfere with [facility] visits.  I assured him they wouldn't and that he should agree to these visits. Client has visited the food pantry and states that he is trying to avoid salty options. He had a visit with Dr. Allena KatzPatel this week who is wanting to perform a nerve conduction test and also obtained blood work to rule out Myasthenia Gravis. Client wanated to know what this was. Nurse explained this to him.  Client wants to do tests as he is really anxious to know why his legs are so weak and tire so easily. Client continues to wear O2 at 3l/min, denies SOB or coughing. Has all meds on table and reports taking as ordered. Weight today 256.6; B/P 136/90 P-80 reg. R-16; lungs clear; no edema noted in legs or feet. Plan:  Visit on next week, will call prior. Will request gift card for food. Client to [facility]che[facility]  Hunter [name] D. Azucena Kubaeid MSN, RN CNP   [phone]  Past Medical History: Past Medical History:  Diagnosis Date  . CHF (congestive heart failure) (HCC)   . Hypertension   . Nephrotic syndrome   . Sarcoid     Encounter Details: CNP Questionnaire - 01/15/17 1912      Questionnaire   Patient Status  Not Applicable    Race  Black or African American    [facility]    Insurance  Not Applicable    Uninsured  Uninsured (NEW 1x/quarter)    Food  Within past 12 months,  worried food would run out with no money to buy more    Housing/Utilities  No permanent housing    Transportation  Yes, need transportation assistance    Interpersonal Safety  Yes, feel physically and emotionally safe where you currently live    Medication  No medication insecurities    Medical Provider  Yes    Referrals  Not Applicable    ED Visit Averted  Not Applicable    Life-Saving Intervention Made  Not Applicable

## 2017-01-18 ENCOUNTER — Telehealth: Payer: Self-pay | Admitting: Licensed Clinical Social Worker

## 2017-01-18 NOTE — Telephone Encounter (Signed)
CSW received call from patient requesting to meet with CSW in the clinic later this week. CSW scheduled appointment with patient for tomorrow at 1:30pm. Patient new to paramedicine program and did not stated specific reason for request to meet with CSW. Lasandra BeechJackie Kadence Mikkelson, LCSW, CCSW-MCS [phone]

## 2017-01-19 ENCOUNTER — Encounter: Payer: Self-pay | Admitting: Licensed Clinical Social Worker

## 2017-01-19 ENCOUNTER — Other Ambulatory Visit (HOSPITAL_COMMUNITY): Payer: Self-pay

## 2017-01-19 MED FILL — traMADol HCL 50 MG TABS: 50 | 3 days supply | Qty: 15 | Fill #0

## 2017-01-19 MED FILL — ?ATORVASTATIN 40MG TABLET: 40 | 30 days supply | Qty: 30 | Fill #1 | Status: TO

## 2017-01-19 MED FILL — MAGNESIUM OXIDE 400 MG TAB: 400 (240 MG | 30 days supply | Qty: 30 | Fill #1

## 2017-01-19 MED FILL — TORSEMIDE 20 MG TABLET: 20 | 30 days supply | Qty: 90 | Fill #1

## 2017-01-19 MED FILL — !VENTOLIN HFA INHALER: 108 (90 BAS | 25 days supply | Qty: 18 | Fill #1

## 2017-01-19 MED FILL — ?METFORMIN HCL 500MG TABLET: 500 | 30 days supply | Qty: 60 | Fill #2

## 2017-01-19 NOTE — Progress Notes (Signed)
[name] was seen in the clinic with [name] and [place] today. He requested the meeting to get to know the team and give us information about his situation. Notes are as follows.   He is currently in the [employer] and is living in a hotel at present and has been extended until January 3rd.   Has [employer] Card   Looking for housing through section 8  Has applied for SSD and unemployment.   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 Will need to apply for medicaid. [name] gave him the information to [facility] for assistance with this.   Watchung sickle cell   Weighs daily and is maintaining weight  Chronic foot pain- gout?   Uses CPAP for sleep apnea

## 2017-01-19 NOTE — Progress Notes (Signed)
Heart Failure Clinic Social Work Assessment  Hunter GasserRobert [name]  was seen today by CSW for follow up/assistance on Financial difficulties Health problems and/or  Finances and Housing.   Social Determinants impacting successful heart failure regimen:  Housing: patient lives alone in a hotel through the [employer]. Food: Do you have enough food? Yes  Do you know and understand healthy eating and how that affects your heart failure diagnosis? Yes  Do you follow a low salt diet?  Yes  Utilities: Do you have gas and/or electricity on in your home? Yes  Income: What is your source of income? Pending Disability Insurance: Orange card/ 100% [employer] [employer] Transportation: Do you have transportation to your medical appointments?Yes  If yes, how? self    Daily Health Needs: Do you have a scale and weigh each day?  Yes  Do you have your medications? Yes  Are you able to adhere to your medication regimen? Yes   Do you ever take medications differently than prescribed? No  Do you know the zones of Heart Failure?  Yes  Do you know how to contact the HF Clinic appropriately with worsening symptoms or weight increases?  Yes     Do you have any identified obstacles / challenges for adherence to current treatment plan?Yes  Patient is currently homeless and staying in a hotel through the [employer] and currently has a deadline of 02/10/17.   Patient appears overwhelmed with his healthcare needs and lack of insurance, income and housing. Patient appears very resourceful and follows through on recommendations for assistance.   CSW assisted patient with [facility] Supportive Counseling with the goal to Increase healthy adjustment to current life circumstances and a permanent hosuing solution.   Heart Failure Clinic Social Worker will continue to coordinate and monitor patient's treatment plan as needed.  CSW continues to be available for identified needs. Hunter BeechJackie Yasuo Phimmasone, LCSW, CCSW-MCS  [phone]

## 2017-01-20 ENCOUNTER — Telehealth: Payer: Self-pay | Admitting: Licensed Clinical Social Worker

## 2017-01-20 ENCOUNTER — Ambulatory Visit (INDEPENDENT_AMBULATORY_CARE_PROVIDER_SITE_OTHER): Payer: Self-pay | Admitting: Neurology

## 2017-01-20 DIAGNOSIS — G729 Myopathy, unspecified: Secondary | ICD-10-CM

## 2017-01-20 DIAGNOSIS — R29898 Other symptoms and signs involving the musculoskeletal system: Secondary | ICD-10-CM

## 2017-01-20 NOTE — Procedures (Signed)
[facility]  [address], [address]  [place], [place] [address] Tel: [phone] Fax:  [phone] Test Date:  01/20/2017  Patient: Hunter GasserRobert Brew DOB: Nov 21, 1970 Physician: Nita Sickleonika Dakari Stabler, DO  Sex: Male Height: 6\' 0"  Ref Phys: Nita Sickleonika Margaree Sandhu, DO  ID#: [id number] Temp: 32.2C Technician:    Patient Complaints: This is a 46 year old man referred for evaluation of bilateral leg weakness.  NCV & EMG Findings: Extensive electrodiagnostic testing of the right upper and lower extremity shows:  1. Right median sensory response shows prolonged latency (3.6 ms) and reduced amplitude (16.7 V).  Right ulnar, sural, and superficial peroneal sensory responses are within normal limits. 2. Right median motor response shows reduced amplitude, however when corrected for the ulnar crossover at the wrist (Martin-Gruber anastomosis), the amplitude is within normal limits.  Right ulnar motor responses within normal limits. Right tibial and peroneal (TA) motor responses show reduced amplitude. 3. Right tibial H reflex study is absent. 4. Small complex motor units are seen affecting the right pronator teres, triceps, deltoid, iliac crest, gluteus medius, and anterior tibialis muscles. Active denervation is present in the anterior tibialis, pronator teres, triceps, deltoid, and cervical paraspinal muscles.  Impression: 1. 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 2. Incidentally, there is a right median neuropathy at or distal to the wrist, consistent with the clinical diagnosis of carpal tunnel syndrome. Overall, these findings are moderate in degree electrically. 3. There is no evidence of a sensorimotor neuropathy or cervical/lumbosacral radiculopathy.    ___________________________ Nita Sickleonika Taleeya Blondin, DO    Nerve Conduction Studies Anti Sensory Summary Table   Site NR Peak (ms) Norm Peak (ms) P-T Amp (V) Norm P-T Amp  Right Median  Anti Sensory (2nd Digit)  32.2C  Wrist    3.6 <3.4 16.7 >20  Right Sup Peroneal Anti Sensory (Ant Lat Mall)  32.2C  12 cm    2.3 <4.5 8.4 >5  Right Sural Anti Sensory (Lat Mall)  32.2C  Calf    2.6 <4.5 5.9 >5  Right Ulnar Anti Sensory (5th Digit)  32.2C  Wrist    3.1 <3.1 19.1 >12   Motor Summary Table   Site NR Onset (ms) Norm Onset (ms) O-P Amp (mV) Norm O-P Amp Site1 Site2 Delta-0 (ms) Dist (cm) Vel (m/s) Norm Vel (m/s)  Right Median Motor (Abd Poll Brev)  32.2C  Wrist    3.4 <3.9 5.8 >6 Elbow Wrist 5.8 31.0 53 >50  Elbow    9.2  5.3  Ulnar-wrist crossover Elbow 5.2 0.0    Ulnar-wrist crossover    4.0  3.6         Right Peroneal Motor (Ext Dig Brev)  32.2C  Ankle    3.2 <5.5 4.6 >3 B Fib Ankle 8.6 40.0 47 >40  B Fib    11.8  4.0  Poplt B Fib 1.4 10.0 71 >40  Poplt    13.2  4.0         Right Peroneal TA Motor (Tib Ant)  32.2C  Fib Head    2.4 <4.0 2.7 >4 Poplit Fib Head 1.9 10.0 53 >40  Poplit    4.3  2.7         Right Tibial Motor (Abd Hall Brev)  32.2C  Ankle    3.9 <6.0 3.5 >8 Knee Ankle 9.8 41.0 42 >40  Knee    13.7  2.4         Right Ulnar Motor (Abd Dig  Minimi)  32.2C  Wrist    2.3 <3.1 9.5 >7 B Elbow Wrist 4.1 23.0 56 >50  B Elbow    6.4  8.4  A Elbow B Elbow 2.0 10.0 50 >50  A Elbow    8.4  8.1          H Reflex Studies   NR H-Lat (ms) Lat Norm (ms) L-R H-Lat (ms)  Right Tibial (Gastroc)  32.2C  NR  <35    EMG   Side Muscle Ins Act Fibs Psw Fasc Number Recrt Dur Dur. Amp Amp. Poly Poly. Comment  Right AntTibialis Nml 1+ Nml Nml Nml Rapid Some 1+ Few 1- Few 1+ N/A  Right 1stDorInt Nml Nml Nml Nml Nml Nml Nml Nml Nml Nml Nml Nml N/A  Right Gastroc Nml Nml Nml Nml 2- Mod-V Nml Nml Nml Nml Nml Nml N/A  Right RectFemoris Nml Nml Nml Nml Nml Nml Nml Nml Nml Nml Nml Nml N/A  Right GluteusMed Nml Nml Nml Nml Nml Nml Some 1+ Some 1- Many 1+ E.R.  Right BicepsFemS Nml Nml Nml Nml Nml Nml Nml Nml Nml Nml Nml Nml N/A  Right Iliacus Nml Nml Nml Nml Nml Nml Some  1+ Some 1- Some 1+ N/A  Right Lumbo Parasp Low Nml Nml Nml Nml Nml Nml Nml Nml Nml Nml Nml Nml N/A  Right Abd Poll Brev Nml Nml Nml Nml Nml Nml Nml Nml Nml Nml Nml Nml N/A  Right PronatorTeres Nml 1+ Nml Nml Nml Nml Some 1+ Some 1- Few 1+ N/A  Right Biceps Nml Nml Nml Nml Nml Nml Nml Nml Nml Nml Nml Nml N/A  Right Triceps Nml 1+ Nml Nml Nml Nml Some 1+ Some 1- Some 1+ N/A  Right Deltoid Nml 1+ Nml Nml Nml Nml Some 1+ Some 1- Some 1+ N/A  Right Cervical Parasp Low Nml 1+ Nml Nml Nml Nml Nml Nml Nml Nml Nml Nml N/A      Waveforms:

## 2017-01-20 NOTE — Telephone Encounter (Signed)
CSW assisted patient with payment of his storage facility as he is enrolled in the [employer] and staying in a hotel. Patient had an overdue bill and was about to lose all his belongings for non payment. Patient is hopeful to find housing pending approval of his disability income. Patient is enrolled in the [employer] program and will continue to follow for supportive needs and assist with housing as available. Patient very grateful for the support. CSW will coordinate with the community paramedic. Lasandra BeechJackie Ashtyn Freilich, LCSW, CCSW-MCS [phone]

## 2017-01-21 ENCOUNTER — Other Ambulatory Visit (HOSPITAL_COMMUNITY): Payer: Self-pay

## 2017-01-21 ENCOUNTER — Telehealth: Payer: Self-pay | Admitting: *Deleted

## 2017-01-21 ENCOUNTER — Other Ambulatory Visit: Payer: Self-pay | Admitting: *Deleted

## 2017-01-21 DIAGNOSIS — R29898 Other symptoms and signs involving the musculoskeletal system: Secondary | ICD-10-CM

## 2017-01-21 DIAGNOSIS — G729 Myopathy, unspecified: Secondary | ICD-10-CM

## 2017-01-21 NOTE — Telephone Encounter (Signed)
Patient given the results and will come in on Monday to have lab work done.

## 2017-01-21 NOTE — Telephone Encounter (Signed)
-----   Message from [place] Chardonika K Patel, DO sent at 01/20/2017  5:50 PM EST ----- Please inform patient that his nerve and muscle testing shows that his weakness may be stemming from a muscle condition. He needs to stop his Lipitor until further instruction sometime this may contribute to muscle pain and weakness.  Please motor myositis panel.  If there is no evidence of autoimmune myopathy based on his myositis panel, the next step is muscle biopsy the left deltoid and rectus femoris muscles.

## 2017-01-21 NOTE — Progress Notes (Signed)
Paramedicine Encounter    Patient ID: Hunter [name], male    DOB: 01/25/1971, 46 y.o.   MRN: [id number]   Patient Care Team: Hunter [name] as PCP - General (Family Medicine)  Patient Active Problem List   Diagnosis Date Noted  . 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 Chronic diastolic CHF (congestive heart failure) (HCC)   . Acute right-sided CHF (congestive heart failure) (HCC)   . Pleural effusion, right   . Acute renal failure with acute tubular necrosis superimposed on stage 3 chronic kidney disease (HCC)   . Prediabetes   . Sarcoidosis of lung (HCC)   . Generalized edema   . Orthostatic hypotension 11/14/2016  . RVF (right ventricular failure) (HCC)   . Pressure injury of skin 11/09/2016  . Acute on chronic respiratory failure with hypoxia and hypercapnia (HCC)   . Pulmonary edema 11/06/2016  . Restrictive lung disease secondary to obesity 11/06/2016  . Obesity hypoventilation syndrome (HCC) 11/06/2016  . OSA (obstructive sleep apnea) 11/06/2016  . CHF (congestive heart failure) (HCC) 11/05/2016  . HTN (hypertension) 11/02/2016  . Hypersomnia 03/02/2016  . Sarcoidosis 02/18/2016  . Hypoxemia 02/18/2016  . Morbid obesity (HCC) 02/18/2016    Current Outpatient Medications:  .  albuterol (PROVENTIL HFA;VENTOLIN HFA) 108 (90 Base) MCG/ACT inhaler, Inhale 2 puffs into the lungs every 6 (six) hours as needed for wheezing or shortness of breath., Disp: 1 Inhaler, Rfl: 5 .  loratadine (CLARITIN) 10 MG tablet, Take 1 tablet (10 mg total) by mouth daily., Disp: 30 tablet, Rfl: 0 .  magnesium oxide (MAG-OX) 400 (241.3 Mg) MG tablet, Take 1 tablet (400 mg total) every evening by mouth., Disp: 90 tablet, Rfl: 2 .  metFORMIN (GLUCOPHAGE) 500 MG tablet, Take 1 tablet (500 mg total) by mouth 2 (two) times daily with a meal., Disp: 180 tablet, Rfl: 3 .  torsemide (DEMADEX) 20 MG tablet, Take 2 tablets (40 mg total) by mouth daily., Disp: 90 tablet, Rfl: 6 .   traMADol (ULTRAM) 50 MG tablet, Take 1 tablet (50 mg total) every 6 (six) hours as needed by mouth., Disp: 15 tablet, Rfl: 0 .  atorvastatin (LIPITOR) 40 MG tablet, Take 1 tablet (40 mg total) daily at 6 PM by mouth. (Patient not taking: Reported on 01/21/2017), Disp: 90 tablet, Rfl: 2 Allergies  Allergen Reactions  . Thalidomide Other (See Comments)    Derm,resp      Social History   Socioeconomic History  . Marital status: Single    Spouse name: Not on file  . Number of children: 0  . Years of education: 512  . Highest education level: Not on file  Social Needs  . Financial resource strain: Not on file  . Food insecurity - worry: Not on file  . Food insecurity - inability: Not on file  . Transportation needs - medical: Not on file  . Transportation needs - non-medical: Not on file  Occupational History  . Not on file  Tobacco Use  . Smoking status: Current Some Day Smoker    Packs/day: 0.50    Years: 10.00    Pack years: 5.00    Types: Cigarettes  . Smokeless tobacco: Never Used  Substance and Sexual Activity  . Alcohol use: Yes    Alcohol/week: 1.8 oz    Types: 3 Cans of beer per week  . Drug use: No  . Sexual activity: Not on file  Other Topics Concern  . Not on file  Social History Narrative  Lives alone.  Currently homeless and staying on the 3rd floor of a hotel with an elevator.     No children.  Education: high school.      Physical Exam  Constitutional: He is oriented to person, place, and time.  Cardiovascular: Normal rate and regular rhythm.  Pulmonary/Chest: Effort normal and breath sounds normal. No respiratory distress. He has no wheezes. He has no rales.  Abdominal: Soft.  Musculoskeletal: Normal range of motion. He exhibits edema.  Neurological: He is alert and oriented to person, place, and time.  Skin: Skin is warm and dry.  Psychiatric: He has a normal mood and affect.        Future Appointments  Date Time Provider Department Center   01/24/2017 10:45 AM Hunter [name] [facility] None  02/14/2017  1:30 PM Hunter [name], Hunter S, MD [facility] None  02/15/2017  2:00 PM Hunter [name], Hunter S, MD [facility] None    BP 130/86 (BP Location: Left Arm, Patient Position: Sitting, Cuff Size: Large)   Pulse 86   Resp 16   Wt 256 lb 9.6 oz (116.4 kg)   SpO2 96%   BMI 34.80 kg/m   Weight yesterday- Did not weigh Last visit weight- N/A  Hunter [name] was seen at home today. He reports SOB with minimal exertion and has dizziness every morning when he gets out of bed. He is able to sleep on his back while using his CPAP. He was given a pillbox and it was filled for him. He seemed confused about how it worked so we spent a considerable amount of time going over it. He said he had been taking all of his medications in the morning despite the directions for metformin being BID. I explained why this was important and he was agreeable. He stated he did not know how to use his inhaler so we went over that as well. He is not taking atorvastatin this week at the orders of [name], [facility]. I contacted the clinic to inform them of the change.   Time spent with patient: 46 minutes  Jacqualine CodeZackery R Ohanna [name], EMT 01/21/17  ACTION: Home visit completed Next visit planned for 1 week

## 2017-01-24 ENCOUNTER — Ambulatory Visit (INDEPENDENT_AMBULATORY_CARE_PROVIDER_SITE_OTHER): Payer: Self-pay | Admitting: Family Medicine

## 2017-01-24 ENCOUNTER — Telehealth: Payer: Self-pay | Admitting: Neurology

## 2017-01-24 ENCOUNTER — Encounter: Payer: Self-pay | Admitting: Family Medicine

## 2017-01-24 VITALS — BP 134/86 | HR 78 | Temp 98.1°F | Ht 72.0 in | Wt 262.6 lb

## 2017-01-24 DIAGNOSIS — Z131 Encounter for screening for diabetes mellitus: Secondary | ICD-10-CM

## 2017-01-24 DIAGNOSIS — I50812 Chronic right heart failure: Secondary | ICD-10-CM

## 2017-01-24 DIAGNOSIS — D869 Sarcoidosis, unspecified: Secondary | ICD-10-CM

## 2017-01-24 DIAGNOSIS — I10 Essential (primary) hypertension: Secondary | ICD-10-CM

## 2017-01-24 DIAGNOSIS — R7303 Prediabetes: Secondary | ICD-10-CM

## 2017-01-24 LAB — POCT GLYCOSYLATED HEMOGLOBIN (HGB A1C)

## 2017-01-24 NOTE — Telephone Encounter (Signed)
Pt left a voicemail message asking for a call back regarding some labs CB# [phone] RM#[phone]

## 2017-01-24 NOTE — Progress Notes (Signed)
Patient ID: Nani GasserRobert Dilmore, male    DOB: November 26, 1970, 46 y.o.   MRN: [id number]  PCP: Bing NeighborsHarris, Kymari Lollis S, FNP  Chief Complaint  Patient presents with  . Follow-up    chronic conditions management     Subjective:  HPI Nani GasserRobert Dobesh is a 46 y.o. male with sarcoidosis, pulmonary edema, OSA, hypertension, prediabetes, and CHF, CKD 3, presents for routine two month follow-up of chronic conditions. Molly MaduroRobert is managed by multidisciplinary team including nephrology, pulmonology, cardiology, of recent neurology. Molly MaduroRobert is currently receiving in home CHF monitoring through patient outreach program in which [place], [place] evaluates patient's symptoms, weight, and respiratory status to reduce CHF exacerbations. Denies any recent shortness of breath, chest pain, abdominal distention, productive cough, chest pain, or swelling. Next scheduled EMT follow-up 01/27/2017. Reports compliance with 2-3 liters of continuous oxygen and CPAP machine. Molly MaduroRobert reports bilateral leg weakness for which he was recently evaluated by Dr. Nita Sickleonika Patel. He is currently discontinuing Atorvastatin as recent labs indicate an elevated CK level. He hasn't notice any major difference since discontinuing Atorvastatin. Molly MaduroRobert has prediabetes, last A1C 6.3. Denies numbness or tingling in fingers or toes, polyuria, polydipsia, or polyphagia. He also complains of worsening left foot pain and has a follow-up scheduled with [facility]. Su HiltRoberts major concern is housing. He is currently residing in a hotel through a program that absorbed the cost, however, he will be required to move out by 02/11/2016. Prior to this, Molly [facility]Robert lived [facility] a [employer] and is firmly against returning. He is unable to work due to his current state of health. Molly MaduroRobert has filed for disability and the cllaim is currently pending. Social History   Socioeconomic History  . Marital status: Single    Spouse name: Not on file  . Number of children: 0  .  Years of education: 10412  . Highest education level: Not on file  Social Needs  . Financial resource strain: Not on file  . Food insecurity - worry: Not on file  . Food insecurity - inability: Not on file  . Transportation needs - medical: Not on file  . Transportation needs - non-medical: Not on file  Occupational History  . Not on file  Tobacco Use  . Smoking status: Current Some Day Smoker    Packs/day: 0.50    Years: 10.00    Pack years: 5.00    Types: Cigarettes  . Smokeless tobacco: Never Used  Substance and Sexual Activity  . Alcohol use: Yes    Alcohol/week: 1.8 oz    Types: 3 Cans of beer per week  . Drug use: No  . Sexual activity: Not on file  Other Topics Concern  . Not on file  Social History Narrative   Lives alone.  Currently homeless and staying on the 3rd floor of a hotel with an elevator.     No children.  Education: high school.      Family History  Problem Relation Age of Onset  . Asthma Sister   . Hypertension Paternal Grandmother    Review of Systems  Constitutional: Positive for fatigue.  HENT: Negative.   Eyes: Negative.   Respiratory: Negative for cough, choking, chest tightness, shortness of breath and wheezing.   Cardiovascular: Negative for chest pain, palpitations and leg swelling.  Gastrointestinal: Negative for abdominal distention.  Genitourinary: Positive for frequency.       Urine frequency due to diuretic   Musculoskeletal: Positive for myalgias.       Bilateral  leg weakness Left foot and ankle pain  Skin: Negative.   Psychiatric/Behavioral: Negative.    Patient Active Problem List   Diagnosis Date Noted  . 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 Chronic diastolic CHF (congestive heart failure) (HCC)   . Acute right-sided CHF (congestive heart failure) (HCC)   . Pleural effusion, right   . Acute renal failure with acute tubular necrosis superimposed on stage 3 chronic kidney disease (HCC)   . Prediabetes    . Sarcoidosis of lung (HCC)   . Generalized edema   . Orthostatic hypotension 11/14/2016  . RVF (right ventricular failure) (HCC)   . Pressure injury of skin 11/09/2016  . Acute on chronic respiratory failure with hypoxia and hypercapnia (HCC)   . Pulmonary edema 11/06/2016  . Restrictive lung disease secondary to obesity 11/06/2016  . Obesity hypoventilation syndrome (HCC) 11/06/2016  . OSA (obstructive sleep apnea) 11/06/2016  . CHF (congestive heart failure) (HCC) 11/05/2016  . HTN (hypertension) 11/02/2016  . Hypersomnia 03/02/2016  . Sarcoidosis 02/18/2016  . Hypoxemia 02/18/2016  . Morbid obesity (HCC) 02/18/2016    Allergies  Allergen Reactions  . Thalidomide Other (See Comments)    Derm,resp    Prior to Admission medications   Medication Sig Start Date End Date Taking? Authorizing Provider  albuterol (PROVENTIL HFA;VENTOLIN HFA) 108 (90 Base) MCG/ACT inhaler Inhale 2 puffs into the lungs every 6 (six) hours as needed for wheezing or shortness of breath. 12/02/16  Yes Leslye PeerByrum, Rome S, MD  loratadine (CLARITIN) 10 MG tablet Take 1 tablet (10 mg total) by mouth daily. 11/18/16  Yes Drema DallasWoods, Curtis J, MD  magnesium oxide (MAG-OX) 400 (241.3 Mg) MG tablet Take 1 tablet (400 mg total) every evening by mouth. 12/17/16  Yes Bing NeighborsHarris, Jaedin Regina S, FNP  metFORMIN (GLUCOPHAGE) 500 MG tablet Take 1 tablet (500 mg total) by mouth 2 (two) times daily with a meal. 11/23/16  Yes Bing NeighborsHarris, Mozell Hardacre S, FNP  torsemide (DEMADEX) 20 MG tablet Take 2 tablets (40 mg total) by mouth daily. 11/26/16  Yes Little IshikawaSmith, Erin E, NP  traMADol (ULTRAM) 50 MG tablet Take 1 tablet (50 mg total) every 6 (six) hours as needed by mouth. 12/21/16  Yes Tegeler, Canary Brimhristopher J, MD  atorvastatin (LIPITOR) 40 MG tablet Take 1 tablet (40 mg total) daily at 6 PM by mouth. Patient not taking: Reported on 01/21/2017 12/17/16   Bing NeighborsHarris, Lexington Krotz S, FNP    Past Medical, Surgical Family and Social History reviewed and updated.     Objective:   Today's Vitals   01/24/17 1053  BP: 134/86  Pulse: 78  Temp: 98.1 F (36.7 C)  TempSrc: Oral  SpO2: 96%  Weight: 262 lb 9.6 oz (119.1 kg)  Height: 6' (1.829 m)    Wt Readings from Last 3 Encounters:  01/24/17 262 lb 9.6 oz (119.1 kg)  01/21/17 256 lb 9.6 oz (116.4 kg)  01/10/17 260 lb 6 oz (118.1 kg)   Physical Exam  Constitutional: He is oriented to person, place, and time. He appears well-developed and well-nourished. Nasal cannula in place.  HENT:  Head: Normocephalic and atraumatic.  Nose: Nose normal.  Mouth/Throat: Oropharynx is clear and moist.  Neck: Normal range of motion. No thyromegaly present.  Pulmonary/Chest: He has decreased breath sounds. He has no rhonchi. He has no rales.  Abdominal: He exhibits no distension and no mass. There is no tenderness.  Musculoskeletal: He exhibits tenderness. He exhibits no edema.  Tenderness left anterior lateral foot and ankle  Negative  of trauma  Lymphadenopathy:    He has no cervical adenopathy.  Neurological: He is alert and oriented to person, place, and time. Coordination and gait abnormal.  Skin: Skin is warm and dry.  Psychiatric: He has a normal mood and affect. His behavior is normal. Judgment and thought content normal.    Assessment & Plan:  1. Chronic right-sided congestive heart failure (HCC), stable, managed by [facility].  Exam today was unremarkable although patient has had a 6 pound weight gain he shows no signs of active symptoms of heart failure.  Will defer any additional management to current cardiology team.  2. Essential hypertension, stable, blood pressure stable and goal.  Continue currently prescribed medications.   3. Sarcoidosis, stable-chronic, continue follow-up with pulmonology.   4. Prediabetes, Stable, A1C 6.1, improving from prior 6.3. Continue metformin.   5. Bilateral leg weakness, new, managed by Dr. Allena Katz, neurology. Elevated CK level,  temporarily d/c Atorvastatin.   6. Left foot and ankle pain, new, recently referred by nephrologist to [facility] and he has an upcoming appointment already scheduled.   Orders Placed This Encounter  Procedures  . POCT glycosylated hemoglobin (Hb A1C)   RTC: 6 months for fasting lipid panel, A1C, and general health maintenance  -The patient was given clear instructions to go to ER or return to medical center if symptoms do not improve, worsen or new problems develop. The patient verbalized understanding.  Godfrey Pick. Tiburcio Pea, MSN, FNP-C [facility]  [address] [place], [place] [address] [phone]

## 2017-01-25 ENCOUNTER — Other Ambulatory Visit: Payer: Self-pay | Admitting: *Deleted

## 2017-01-25 ENCOUNTER — Other Ambulatory Visit: Payer: Self-pay | Admitting: Neurology

## 2017-01-25 DIAGNOSIS — G729 Myopathy, unspecified: Secondary | ICD-10-CM

## 2017-01-25 DIAGNOSIS — R29898 Other symptoms and signs involving the musculoskeletal system: Secondary | ICD-10-CM

## 2017-01-25 NOTE — Progress Notes (Unsigned)
.  my

## 2017-01-26 ENCOUNTER — Telehealth: Payer: Self-pay | Admitting: Licensed Clinical Social Worker

## 2017-01-26 NOTE — Telephone Encounter (Signed)
CSW received separate calls from [employer] and patient both requesting a [facility]'s current [facility] and pending termination from the [employer] by 02/10/17. CSW confirmed with [employer] Rn Carvel GettingFelicia Reed that patient will not be extended after 02/10/17 and will need housing as current situation is temporary through the [employer]. [facility] and [facility] [phone] per patient request. CSW spoke with patient to approve contents of letter and faxed per his request. Patient grateful for assistance. CSW available as needed. Lasandra BeechJackie Zenita Kister, LCSW, CCSW-MCS [phone]

## 2017-01-26 NOTE — Telephone Encounter (Signed)
Patient in for lab work yesterday.

## 2017-01-27 ENCOUNTER — Ambulatory Visit: Payer: MEDICAID | Admitting: Podiatry

## 2017-01-27 ENCOUNTER — Telehealth (HOSPITAL_COMMUNITY): Payer: Self-pay

## 2017-01-27 NOTE — Telephone Encounter (Signed)
I called Mr Hunter [name] to schedule an appointment. He stated he was going to be busy on Thursday but could meet on Friday at 11:00.

## 2017-01-28 ENCOUNTER — Other Ambulatory Visit (HOSPITAL_COMMUNITY): Payer: Self-pay

## 2017-01-28 ENCOUNTER — Telehealth (HOSPITAL_COMMUNITY): Payer: Self-pay | Admitting: *Deleted

## 2017-01-28 NOTE — Progress Notes (Signed)
Paramedicine Encounter    Patient ID: Hunter [name], male    DOB: 01/25/1971, 46 y.o.   MRN: [id number]   Patient Care Team: Hunter NeighborsHarris, Kimberly S, FNP as PCP - General (Family Medicine)  Patient Active Problem List   Diagnosis Date Noted  . 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 Chronic diastolic CHF (congestive heart failure) (HCC)   . Acute right-sided CHF (congestive heart failure) (HCC)   . Pleural effusion, right   . Acute renal failure with acute tubular necrosis superimposed on stage 3 chronic kidney disease (HCC)   . Prediabetes   . Sarcoidosis of lung (HCC)   . Generalized edema   . Orthostatic hypotension 11/14/2016  . RVF (right ventricular failure) (HCC)   . Pressure injury of skin 11/09/2016  . Acute on chronic respiratory failure with hypoxia and hypercapnia (HCC)   . Pulmonary edema 11/06/2016  . Restrictive lung disease secondary to obesity 11/06/2016  . Obesity hypoventilation syndrome (HCC) 11/06/2016  . OSA (obstructive sleep apnea) 11/06/2016  . CHF (congestive heart failure) (HCC) 11/05/2016  . HTN (hypertension) 11/02/2016  . Hypersomnia 03/02/2016  . Sarcoidosis 02/18/2016  . Hypoxemia 02/18/2016  . Morbid obesity (HCC) 02/18/2016    Current Outpatient Medications:  .  albuterol (PROVENTIL HFA;VENTOLIN HFA) 108 (90 Base) MCG/ACT inhaler, Inhale 2 puffs into the lungs every 6 (six) hours as needed for wheezing or shortness of breath., Disp: 1 Inhaler, Rfl: 5 .  loratadine (CLARITIN) 10 MG tablet, Take 1 tablet (10 mg total) by mouth daily., Disp: 30 tablet, Rfl: 0 .  magnesium oxide (MAG-OX) 400 (241.3 Mg) MG tablet, Take 1 tablet (400 mg total) every evening by mouth., Disp: 90 tablet, Rfl: 2 .  metFORMIN (GLUCOPHAGE) 500 MG tablet, Take 1 tablet (500 mg total) by mouth 2 (two) times daily with a meal., Disp: 180 tablet, Rfl: 3 .  torsemide (DEMADEX) 20 MG tablet, Take 2 tablets (40 mg total) by mouth daily., Disp: 90 tablet, Rfl: 6 .   traMADol (ULTRAM) 50 MG tablet, Take 1 tablet (50 mg total) every 6 (six) hours as needed by mouth., Disp: 15 tablet, Rfl: 0 .  atorvastatin (LIPITOR) 40 MG tablet, Take 1 tablet (40 mg total) daily at 6 PM by mouth. (Patient not taking: Reported on 01/21/2017), Disp: 90 tablet, Rfl: 2 Allergies  Allergen Reactions  . Thalidomide Other (See Comments)    Derm,resp      Social History   Socioeconomic History  . Marital status: Single    Spouse name: Not on file  . Number of children: 0  . Years of education: 512  . Highest education level: Not on file  Social Needs  . Financial resource strain: Not on file  . Food insecurity - worry: Not on file  . Food insecurity - inability: Not on file  . Transportation needs - medical: Not on file  . Transportation needs - non-medical: Not on file  Occupational History  . Not on file  Tobacco Use  . Smoking status: Current Some Day Smoker    Packs/day: 0.50    Years: 10.00    Pack years: 5.00    Types: Cigarettes  . Smokeless tobacco: Never Used  Substance and Sexual Activity  . Alcohol use: Yes    Alcohol/week: 1.8 oz    Types: 3 Cans of beer per week  . Drug use: No  . Sexual activity: Not on file  Other Topics Concern  . Not on file  Social History Narrative  Lives alone.  Currently homeless and staying on the 3rd floor of a hotel with an elevator.     No children.  Education: high school.      Physical Exam  Constitutional: He is oriented to person, place, and time.  Cardiovascular: Normal rate and regular rhythm.  Pulmonary/Chest: Effort normal. No respiratory distress. He has no wheezes. He has no rales.  Musculoskeletal: Normal range of motion. He exhibits edema.  Neurological: He is alert and oriented to person, place, and time. He has normal reflexes.  Skin: Skin is warm and dry.  Psychiatric: He has a normal mood and affect.    SAFE - 01/21/17 1500      Situation   Admitting diagnosis  CHF    Heart failure  history  Exisiting    Comorbidities  DM    Readmitted within 30 days  No    Hospital admission within past 12 months  Yes    number of hospital admissions  1    number of ED visits  2      Assessment   Lives alone  Yes    Primary support person  self    Mode of transportation  personal car    Other services involved  Home Health    Home equipement  Home O2;Scale      Weight   Weighs self daily  Yes    Scale provided  No      Resources   Has "Living better w/heart failure" book  Yes    Has HF Zone tool  Yes    Able to identify yellow zone signs/when to call MD  Yes    Records zone daily  No      Medications   Uses a pill box  Yes    Who stocks the pill box  Paramedicine    Pill box checked this visit  Yes    Pill box refilled this visit  Yes    Difficulty obtaining medications  Yes    Barriers to medication  finance    Community Paramedic obtains medications from pharmacy  No    Mail order medications  No    Missed one or more doses of medications per week  No      Nutrition   Patient receives meals on wheels  No    Patient follows low sodium diet  Yes    Has foods at home that meet the current recommended diet  Yes    Patient follows low sugar/card diet  Yes    Nutritional concerns/issues  no      Activity Level   ADL'[name]/Mobility  Independent    How many feet can patient ambulate  5      Urine   Difficulty urinating  No    Changes in urine  None      Time spent with patient   Time spent with patient   45 Minutes         Future Appointments  Date Time Provider Department Center  02/14/2017  1:30 PM Hunter PeerByrum, Spurgeon S, MD [facility] None  02/15/2017  2:00 PM Hunter MoraleMcLean, Dalton S, MD [facility] None  07/25/2017 11:00 AM Hunter NeighborsHarris, Kimberly S, FNP [facility] None    BP 130/90 (BP Location: Left Arm, Patient Position: Sitting, Cuff Size: Large)   Pulse 88   Resp 16   Wt 258 lb 6.4 oz (117.2 kg)   SpO2 96%   BMI 35.05 kg/m   Weight yesterday- 252.6 lb Last  visit weight-   256.6 lb  Hunter [name] was seen at home today. He reported feeling SOB with exertion and has minor edema in his lower extremities. He reports being compliant with his medications despite a weight gain of 6 lb overnight. The clinic was contacted and requested he take 40 mg torsemide BID for the next two days. His medications were verified and his pillbox was filled with the recommended changes.   Time spent with patient: 32 minutes  Jacqualine CodeZackery R Duane Trias, EMT 01/28/17  ACTION: Home visit completed Next visit planned for 1 week

## 2017-01-28 NOTE — Telephone Encounter (Signed)
Hunter [name] called from the patients home to report patients weight gain of 6lbs in week. Patient c/o shortness of breath. Per Amy increase Torsemide to 40mg  bid for 2 days. Pt and Hunter [name] aware and agreeable.

## 2017-02-02 MED FILL — traMADol HCL 50 MG TABS: 50 | 30 days supply | Qty: 60 | Fill #1

## 2017-02-03 ENCOUNTER — Telehealth (HOSPITAL_COMMUNITY): Payer: Self-pay

## 2017-02-03 LAB — MYOSITIS PANEL III
EJ*: NEGATIVE
JO-1 (WB): NEGATIVE
KU: NEGATIVE
Mi-2 antibodies*: NEGATIVE
OJ*: NEGATIVE
PL-12*: NEGATIVE
PL-7*: NEGATIVE
PM-SCL 100: NEGATIVE
PM-Scl 75*: NEGATIVE
RNP: 3.9 EU/ml
RO-52*: NEGATIVE
SIGNAL RECOGNITION PARTICLE: NEGATIVE

## 2017-02-03 NOTE — Telephone Encounter (Signed)
I called Mr Hunter [name] to schedule an appointment. He said he would be available for a meeting at 08:30 tomorrow.

## 2017-02-03 NOTE — Congregational Nurse Program (Signed)
[facility]. Client reports doing okay. Had visit with kidney doctor today and kidneys are doing fine. Not due to return for 6 months. was also seen recently by Dr. Allena KatzPatel who stopped his Atorvastatin and also collected blood work.  States that his legs and breathing slows him down after about 15 feet of walking.  He does not wear his oxygen when he's just going downstairs in the motel.  Nurse suggested that he should always wear his oxygen until the doctor tells him otherwise.  Continues to take tramadol which helps with the pain he's having in his feet, taking every 6 hours.  Weight today is 262 with clothes on. States that he is receiving a paramedic visit once a week and is scheduled to [facility].  B/P today 162/90 P-80 and regular Resp 18 with O2 on 3l/min nasal cannula Lungs clear slightly diminished breath sounds on rt side.   Client and SW Clydie BraunKaren discussed housing prospects (see her notes)  Plan: Client request gift card to assist with food.  Nurse will check with Lisabeth PickLelia Moore and will deliver once Lelia lets her know one is ready for pick up.Client to follow up with Dr. Allena KatzPatel re: blood work and to report if leg weakness improved since discontinuing Atorvastatin.Nurse plans to visit next week. Sidharth Leverette D. Azucena Kubaeid MSN, RN CNP [phone] Congregational Nurse Program Note  Date of Encounter: 01/27/2017  Past Medical History: Past Medical History:  Diagnosis Date  . CHF (congestive heart failure) (HCC)   . Hypertension   . Nephrotic syndrome   . Sarcoid     Encounter Details: CNP Questionnaire - 01/15/17 1912      Questionnaire   Patient Status  Not Applicable    Race  Black or African American    [facility]    Insurance  Not Applicable    Uninsured  Uninsured (NEW 1x/quarter)    Food  Within past 12 months, worried food would run out with no money to buy more    Housing/Utilities  No permanent housing     Transportation  Yes, need transportation assistance    Interpersonal Safety  Yes, feel physically and emotionally safe where you currently live    Medication  No medication insecurities    Medical Provider  Yes    Referrals  Not Applicable    ED Visit Averted  Not Applicable    Life-Saving Intervention Made  Not Applicable

## 2017-02-03 NOTE — Congregational Nurse Program (Unsigned)
[facility] Client reports doing okay.  Reports that he had been feeling dizzy and that legs and feet pain had been bothering him. Continues to take tramadol which does help.  Stil waiting for results from neurology.  He was unable to see the foot doctor due to lack of insurance.  He has already applied for Medicaid and will have to reschedule foot doctor appt. After he receives Medicaid.  Client states that he is still unable to ambulate more than 15 feet without becoming short of breath without his oxygen. Reminded client again to always wear the oxygen. Weight today is 253 without clothes on per client. Discussed diet, client reports that he did have fried [employer] chicken today and he know that he will pay for it. Client realizes that there is lots of sodium in this chicken. Nurse suggested other alternatives. B/P 138/90; P-68 Resp.-18 on 3L/min. Lungs clear, slightly diminished on rt side, states no swelling in his feet. Nurse provided client with a $100.00 gift card to assist with food and other items. Client heading out shortly to see a 2 bedroom house.  States that he will be receiving $771. SSI and in April may be receiving more in Disability. He' not sure how he will be able to financially pay rent and car payment.  Will check with [employer] this week.  Nurse plans to visist again on next week with SW who couldn't come today. Hunter Rylander D. Azucena Kubaeid MSN, RN CNP [phone] Congregational Nurse Program Note  Date of Encounter: 02/03/2017  Past Medical History: Past Medical History:  Diagnosis Date  . CHF (congestive heart failure) (HCC)   . Hypertension   . Nephrotic syndrome   . Sarcoid     Encounter Details: CNP Questionnaire - 02/03/17 1951      Questionnaire   Patient Status  Not Applicable    Race  Black or African American    [facility]    Insurance  Not Applicable    Uninsured  Not Applicable    Food  Within past 12 months,  worried food would run out with no money to buy more    Housing/Utilities  No permanent housing    Transportation  No transportation needs    Interpersonal Safety  Yes, feel physically and emotionally safe where you currently live    Medication  No medication insecurities    Medical Provider  Yes    Referrals  Not Applicable    ED Visit Averted  Not Applicable    Life-Saving Intervention Made  Not Applicable

## 2017-02-04 ENCOUNTER — Telehealth: Payer: Self-pay | Admitting: Neurology

## 2017-02-04 ENCOUNTER — Other Ambulatory Visit (HOSPITAL_COMMUNITY): Payer: Self-pay

## 2017-02-04 NOTE — Telephone Encounter (Signed)
Patient called regarding lab work results. Please Call. Thanks

## 2017-02-04 NOTE — Telephone Encounter (Signed)
Called patient back.  No answer and no voicemail.  Will send note to Dr. Allena KatzPatel for results.

## 2017-02-04 NOTE — Progress Notes (Signed)
Paramedicine Encounter    Patient ID: Hunter [name], male    DOB: 01/25/1971, 46 y.o.   MRN: [id number]   Patient Care Team: Hunter [name] as PCP - General (Family Medicine)  Patient Active Problem List   Diagnosis Date Noted  . 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 Chronic diastolic CHF (congestive heart failure) (HCC)   . Acute right-sided CHF (congestive heart failure) (HCC)   . Pleural effusion, right   . Acute renal failure with acute tubular necrosis superimposed on stage 3 chronic kidney disease (HCC)   . Prediabetes   . Sarcoidosis of lung (HCC)   . Generalized edema   . Orthostatic hypotension 11/14/2016  . RVF (right ventricular failure) (HCC)   . Pressure injury of skin 11/09/2016  . Acute on chronic respiratory failure with hypoxia and hypercapnia (HCC)   . Pulmonary edema 11/06/2016  . Restrictive lung disease secondary to obesity 11/06/2016  . Obesity hypoventilation syndrome (HCC) 11/06/2016  . OSA (obstructive sleep apnea) 11/06/2016  . CHF (congestive heart failure) (HCC) 11/05/2016  . HTN (hypertension) 11/02/2016  . Hypersomnia 03/02/2016  . Sarcoidosis 02/18/2016  . Hypoxemia 02/18/2016  . Morbid obesity (HCC) 02/18/2016    Current Outpatient Medications:  .  albuterol (PROVENTIL HFA;VENTOLIN HFA) 108 (90 Base) MCG/ACT inhaler, Inhale 2 puffs into the lungs every 6 (six) hours as needed for wheezing or shortness of breath., Disp: 1 Inhaler, Rfl: 5 .  loratadine (CLARITIN) 10 MG tablet, Take 1 tablet (10 mg total) by mouth daily., Disp: 30 tablet, Rfl: 0 .  magnesium oxide (MAG-OX) 400 (241.3 Mg) MG tablet, Take 1 tablet (400 mg total) every evening by mouth., Disp: 90 tablet, Rfl: 2 .  metFORMIN (GLUCOPHAGE) 500 MG tablet, Take 1 tablet (500 mg total) by mouth 2 (two) times daily with a meal., Disp: 180 tablet, Rfl: 3 .  torsemide (DEMADEX) 20 MG tablet, Take 2 tablets (40 mg total) by mouth daily., Disp: 90 tablet, Rfl: 6 .   traMADol (ULTRAM) 50 MG tablet, Take 1 tablet (50 mg total) every 6 (six) hours as needed by mouth., Disp: 15 tablet, Rfl: 0 .  atorvastatin (LIPITOR) 40 MG tablet, Take 1 tablet (40 mg total) daily at 6 PM by mouth. (Patient not taking: Reported on 01/21/2017), Disp: 90 tablet, Rfl: 2 Allergies  Allergen Reactions  . Thalidomide Other (See Comments)    Derm,resp      Social History   Socioeconomic History  . Marital status: Single    Spouse name: Not on file  . Number of children: 0  . Years of education: 512  . Highest education level: Not on file  Social Needs  . Financial resource strain: Not on file  . Food insecurity - worry: Not on file  . Food insecurity - inability: Not on file  . Transportation needs - medical: Not on file  . Transportation needs - non-medical: Not on file  Occupational History  . Not on file  Tobacco Use  . Smoking status: Current Some Day Smoker    Packs/day: 0.50    Years: 10.00    Pack years: 5.00    Types: Cigarettes  . Smokeless tobacco: Never Used  Substance and Sexual Activity  . Alcohol use: Yes    Alcohol/week: 1.8 oz    Types: 3 Cans of beer per week  . Drug use: No  . Sexual activity: Not on file  Other Topics Concern  . Not on file  Social History Narrative  Lives alone.  Currently homeless and staying on the 3rd floor of a hotel with an elevator.     No children.  Education: high school.      Physical Exam  SAFE - 01/21/17 1500      Situation   Admitting diagnosis  CHF    Heart failure history  Exisiting    Comorbidities  DM    Readmitted within 30 days  No    Hospital admission within past 12 months  Yes    number of hospital admissions  1    number of ED visits  2      Assessment   Lives alone  Yes    Primary support person  self    Mode of transportation  personal car    Other services involved  Home Health    Home equipement  Home O2;Scale      Weight   Weighs self daily  Yes    Scale provided  No       Resources   Has "Living better w/heart failure" book  Yes    Has HF Zone tool  Yes    Able to identify yellow zone signs/when to call [name]  Yes    Records zone daily  No      Medications   Uses a pill box  Yes    Who stocks the pill box  Paramedicine    Pill box checked this visit  Yes    Pill box refilled this visit  Yes    Difficulty obtaining medications  Yes    Barriers to medication  finance    Community Paramedic obtains medications from pharmacy  No    Mail order medications  No    Missed one or more doses of medications per week  No      Nutrition   Patient receives meals on wheels  No    Patient follows low sodium diet  Yes    Has foods at home that meet the current recommended diet  Yes    Patient follows low sugar/card diet  Yes    Nutritional concerns/issues  no      Activity Level   ADL'[name]/Mobility  Independent    How many feet can patient ambulate  5      Urine   Difficulty urinating  No    Changes in urine  None      Time spent with patient   Time spent with patient   45 Minutes         Future Appointments  Date Time Provider Department Center  02/14/2017  1:30 PM Hunter PeerByrum, Mildred [name], [name] [facility] None  02/15/2017  2:00 PM Hunter MoraleMcLean, Dalton [name], [name] [facility] None  07/25/2017 11:00 AM Hunter [name] [facility] None    BP 126/84 (BP Location: Left Arm, Patient Position: Sitting, Cuff Size: Large)   Pulse 80   Resp 18   Wt 260 lb (117.9 kg)   SpO2 98%   BMI 35.26 kg/m   Weight yesterday- 262 lb Last visit weight- 258.4 lb  Hunter [name] was seen at home today and reported feeling dizzy when he wakes up early in the morning. He said he was dizzy while with me but his vital signs were within normal limits. He reports being compliant with his medications. He is concerned about his housing situation because his deadline to be out of the motel is approaching. He asked me to leave a message for Hunter [name] that he has  received his social security award paperwork. He also  was going to see an apartment this weekend which he had already filled out an application on. His medications were verified and his pillbox was refilled.   Time spent with patient: 32 minutes  Jacqualine CodeZackery R Velda [name], EMT 02/04/17  ACTION: Home visit completed Next visit planned for 1 week

## 2017-02-07 ENCOUNTER — Telehealth: Payer: Self-pay | Admitting: Licensed Clinical Social Worker

## 2017-02-07 NOTE — Telephone Encounter (Signed)
CSW received call from patient stating that he received his monied from [employer] although still has a pending offer for housing. Patient working on a plan although due to the holiday struggling with contacting anyone to confirm housing plan. Patient requesting extension on hs stay with [facility] [facility]. CSW encouraged patient to contact his RN/Social Worker from the [employer] and request extension. CSW available to advocate as needed but need to begin extension process with RN/SW. Patient verbalizes understanding and will reach out to CSW with update. CSW continues to follow for support and community resources. Hunter BeechJackie Japhet Morgenthaler, LCSW, CCSW-MCS [phone]

## 2017-02-07 NOTE — Telephone Encounter (Signed)
Please inform patient that lab test was normal and did not show any antibodies causing muscle disease.  The next step is muscle biopsy, if he is agreeable, please refer for left rectus femoris and deltoid muscles muscle biopsy.  Donika K. Allena KatzPatel, DO

## 2017-02-07 NOTE — Telephone Encounter (Signed)
Left message for patient to call me back. 

## 2017-02-09 ENCOUNTER — Telehealth: Payer: Self-pay | Admitting: Licensed Clinical Social Worker

## 2017-02-09 NOTE — Telephone Encounter (Signed)
CSW received call from patient today concerned about termination from the [employer] tomorrow. Patient has been staying in a hotel under the [employer] and stated he will need an extension for 2 weeks to complete his housing plan. Patient has contacted [employer] for support for rent and deposit and will follow up tomorrow to confirm. Patient requesting CSW to assist with extension request. CSW encouraged patient to return call to his RN through the [employer] to follow through with extension request. CSW informed [facility], Director about pending request. CSW continues to be available for support as needed. Lasandra BeechJackie Dawt Reeb, LCSW, CCSW-MCS [phone]

## 2017-02-10 ENCOUNTER — Telehealth: Payer: Self-pay | Admitting: Licensed Clinical Social Worker

## 2017-02-10 NOTE — Telephone Encounter (Signed)
CSW received call from patient this morning stating he continues to work on his plan for housing. Patient admitted to complexity of his plan and acknowledged simpler suggested plan by CSW. Patient continues to pursue plan and aware of discharge from [employer] as of 02/16/17 per [facility], Director. CSW continues to be available for support as needed. Lasandra BeechJackie Wacey Zieger, LCSW, CCSW-MCS [phone]

## 2017-02-10 NOTE — Telephone Encounter (Signed)
I spoke with patient and gave him the results.  He would like to proceed with biopsy.  I will schedule this with CCS.

## 2017-02-11 ENCOUNTER — Other Ambulatory Visit (HOSPITAL_COMMUNITY): Payer: Self-pay

## 2017-02-11 NOTE — Progress Notes (Signed)
Paramedicine Encounter    Patient ID: Nani GasserRobert Palazzi, male    DOB: 02-Apr-1970, 47 y.o.   MRN: [id number]   Patient Care Team: Bing NeighborsHarris, Kimberly S, FNP as PCP - General (Family Medicine)  Patient Active Problem List   Diagnosis Date Noted  . 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 Chronic diastolic CHF (congestive heart failure) (HCC)   . Acute right-sided CHF (congestive heart failure) (HCC)   . Pleural effusion, right   . Acute renal failure with acute tubular necrosis superimposed on stage 3 chronic kidney disease (HCC)   . Prediabetes   . Sarcoidosis of lung (HCC)   . Generalized edema   . Orthostatic hypotension 11/14/2016  . RVF (right ventricular failure) (HCC)   . Pressure injury of skin 11/09/2016  . Acute on chronic respiratory failure with hypoxia and hypercapnia (HCC)   . Pulmonary edema 11/06/2016  . Restrictive lung disease secondary to obesity 11/06/2016  . Obesity hypoventilation syndrome (HCC) 11/06/2016  . OSA (obstructive sleep apnea) 11/06/2016  . CHF (congestive heart failure) (HCC) 11/05/2016  . HTN (hypertension) 11/02/2016  . Hypersomnia 03/02/2016  . Sarcoidosis 02/18/2016  . Hypoxemia 02/18/2016  . Morbid obesity (HCC) 02/18/2016    Current Outpatient Medications:  .  albuterol (PROVENTIL HFA;VENTOLIN HFA) 108 (90 Base) MCG/ACT inhaler, Inhale 2 puffs into the lungs every 6 (six) hours as needed for wheezing or shortness of breath., Disp: 1 Inhaler, Rfl: 5 .  atorvastatin (LIPITOR) 40 MG tablet, Take 1 tablet (40 mg total) daily at 6 PM by mouth., Disp: 90 tablet, Rfl: 2 .  loratadine (CLARITIN) 10 MG tablet, Take 1 tablet (10 mg total) by mouth daily., Disp: 30 tablet, Rfl: 0 .  magnesium oxide (MAG-OX) 400 (241.3 Mg) MG tablet, Take 1 tablet (400 mg total) every evening by mouth., Disp: 90 tablet, Rfl: 2 .  metFORMIN (GLUCOPHAGE) 500 MG tablet, Take 1 tablet (500 mg total) by mouth 2 (two) times daily with a meal., Disp: 180 tablet,  Rfl: 3 .  torsemide (DEMADEX) 20 MG tablet, Take 2 tablets (40 mg total) by mouth daily., Disp: 90 tablet, Rfl: 6 .  traMADol (ULTRAM) 50 MG tablet, Take 1 tablet (50 mg total) every 6 (six) hours as needed by mouth., Disp: 15 tablet, Rfl: 0 Allergies  Allergen Reactions  . Thalidomide Other (See Comments)    Derm,resp      Social History   Socioeconomic History  . Marital status: Single    Spouse name: Not on file  . Number of children: 0  . Years of education: 3412  . Highest education level: Not on file  Social Needs  . Financial resource strain: Not on file  . Food insecurity - worry: Not on file  . Food insecurity - inability: Not on file  . Transportation needs - medical: Not on file  . Transportation needs - non-medical: Not on file  Occupational History  . Not on file  Tobacco Use  . Smoking status: Current Some Day Smoker    Packs/day: 0.50    Years: 10.00    Pack years: 5.00    Types: Cigarettes  . Smokeless tobacco: Never Used  Substance and Sexual Activity  . Alcohol use: Yes    Alcohol/week: 1.8 oz    Types: 3 Cans of beer per week  . Drug use: No  . Sexual activity: Not on file  Other Topics Concern  . Not on file  Social History Narrative   Lives alone.  Currently  homeless and staying on the 3rd floor of a hotel with an Engineer, structural.     No children.  Education: high school.      Physical Exam  Constitutional: He is oriented to person, place, and time.  Cardiovascular: Normal rate and regular rhythm.  Pulmonary/Chest: Effort normal and breath sounds normal. No respiratory distress. He has no wheezes. He has no rales.  Abdominal: Soft. He exhibits no distension.  Musculoskeletal: Normal range of motion. He exhibits no edema.  Neurological: He is alert and oriented to person, place, and time.  Skin: Skin is warm and dry.  Psychiatric: He has a normal mood and affect.    SAFE - 01/21/17 1500      Situation   Admitting diagnosis  CHF    Heart failure  history  Exisiting    Comorbidities  DM    Readmitted within 30 days  No    Hospital admission within past 12 months  Yes    number of hospital admissions  1    number of ED visits  2      Assessment   Lives alone  Yes    Primary support person  self    Mode of transportation  personal car    Other services involved  Home Health    Home equipement  Home O2;Scale      Weight   Weighs self daily  Yes    Scale provided  No      Resources   Has "Living better w/heart failure" book  Yes    Has HF Zone tool  Yes    Able to identify yellow zone signs/when to call MD  Yes    Records zone daily  No      Medications   Uses a pill box  Yes    Who stocks the pill box  Paramedicine    Pill box checked this visit  Yes    Pill box refilled this visit  Yes    Difficulty obtaining medications  Yes    Barriers to medication  finance    Community Paramedic obtains medications from pharmacy  No    Mail order medications  No    Missed one or more doses of medications per week  No      Nutrition   Patient receives meals on wheels  No    Patient follows low sodium diet  Yes    Has foods at home that meet the current recommended diet  Yes    Patient follows low sugar/card diet  Yes    Nutritional concerns/issues  no      Activity Level   ADL's/Mobility  Independent    How many feet can patient ambulate  5      Urine   Difficulty urinating  No    Changes in urine  None      Time spent with patient   Time spent with patient   45 Minutes         Future Appointments  Date Time Provider Department Center  02/14/2017  1:30 PM Leslye Peer, MD [facility] None  02/15/2017  2:00 PM Laurey Morale, MD [facility] None  02/16/2017  2:00 PM Bing Neighbors, FNP [facility] None  02/18/2017 10:30 AM Vivi Barrack, DPM [facility]  07/25/2017 11:00 AM Bing Neighbors, FNP [facility] None    BP 136/90 (BP Location: Left Arm, Patient Position: Sitting, Cuff Size: Large)   Pulse 86    Resp  16   Wt 257 lb (116.6 kg)   SpO2 97%   BMI 34.86 kg/m   Weight yesterday- 255 lb Last visit weight- 260 lb  Mr Deriso was seen at home today and was complaining of foot pain. He said this pain has been persistent so I suggested he speak to his PCP about it. He reported being compliant with his medications but is still eating foods high in sodium. He said he eats mostly sandwiches with deli meat and cheese. I explained why these food choices are poor and he said he understood. His medications were verified and his pillbox was refilled.  Time spent with patient: 28  minutes  Jacqualine Code, EMT 02/11/17  ACTION: Home visit completed Next visit planned for 1 week

## 2017-02-14 ENCOUNTER — Ambulatory Visit (INDEPENDENT_AMBULATORY_CARE_PROVIDER_SITE_OTHER): Payer: Self-pay | Admitting: Emergency Medicine

## 2017-02-14 ENCOUNTER — Telehealth: Payer: Self-pay | Admitting: Licensed Clinical Social Worker

## 2017-02-14 ENCOUNTER — Other Ambulatory Visit: Payer: Self-pay | Admitting: *Deleted

## 2017-02-14 ENCOUNTER — Telehealth: Payer: Self-pay | Admitting: Neurology

## 2017-02-14 ENCOUNTER — Encounter: Payer: Self-pay | Admitting: Emergency Medicine

## 2017-02-14 VITALS — BP 122/80 | HR 76 | Ht 72.0 in | Wt 261.0 lb

## 2017-02-14 DIAGNOSIS — G729 Myopathy, unspecified: Secondary | ICD-10-CM

## 2017-02-14 DIAGNOSIS — G4733 Obstructive sleep apnea (adult) (pediatric): Secondary | ICD-10-CM

## 2017-02-14 NOTE — Assessment & Plan Note (Signed)
Some probable obstruction.  Is unclear whether he benefited from the albuterol.  He is reticent to start a scheduled bronchodilator given some agitation and side effects from the albuterol.  I will defer for now.

## 2017-02-14 NOTE — Patient Instructions (Signed)
Keep albuterol available to use 2 puffs if needed for shortness of breath Please continue oxygen at 3 L/min as you have been using it Continue to wear your BiPAP 15/12 for now every night. We will work on performing a pressure titration study to optimize your BiPAP pressures. Follow with Dr Delton CoombesByrum in 4 months or sooner if you have any problems.

## 2017-02-14 NOTE — Assessment & Plan Note (Signed)
Currently on BiPAP.  He did not have a titration study done during his split-night due to number of early evening apneas.  He still needs a pressure titration.  I believe he will need to fail CPAP in order to be titrated to BiPAP.  I will try to make a clear to the sleep lab that he has obesity hypoventilation syndrome and will need BiPAP data generated.

## 2017-02-14 NOTE — Progress Notes (Signed)
Subjective:    Patient ID: Hunter GasserRobert [name], male    DOB: [birth year], 47 y.o.   MRN: [id number]  HPI 47 year old man with a history of biopsy-proven sarcoidosis, skin biopsy 2002, off steroids since '07.  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  He was admitted in September 2018 with right heart failure, significant volume overload and decompensated pulmonary hypertension.  He has had pulmonary function testing 02/12/16 that showed evidence for mixed obstruction and restriction on spirometry, significant restriction on lung volumes.  CT scan performed on 11/11/16 was reviewed by me.  This shows no evidence of mediastinal or hilar lymphadenopathy (no contrast), chronic right-sided pleural thickening with some associated calcification, trace right-sided pleural effusion, chronic areas of linear scarring bilaterally.  No evidence of groundglass, consolidation. He is chronically on 3L/min, was sent home from recent hospitalization with BiPAP, needs a titration study, currently on 15/12. His wt and edema have been stable.   He does have SOB with exertion, even w his O2 on. Has not used BD's  He is in the [employer], is staying at a hotel with their assistance. Medicaid is pending. They have also helped with securing his BiPAP.    ROV 02/14/17 --47 year old man with sarcoidosis, obesity, diastolic dysfunction chronic renal insufficiency.  He has probable obesity hypoventilation syndrome/OSA and we sent him for a split-night sleep study to titrate BiPAP.  Unfortunately he did not have enough events early in the evening to trigger BiPAP titration.  He is currently using 15/12. He feels that he is benefiting from the BiPAP, sleep is better, energy is better. We did a  trial of albuterol > he is unsure that he benefits. He feels side effects, some tremor. He usually wears 3L/min.   Multifactorial PAH Sarcoidosis without parenchymal disease, ? Need BD's OSA/OHS > need PSG asap Secondary PAH > most important issue is nocturnal, daytime O2   R heart cath 11/11/16:  PAP 49/20 (30)  PAOP mean 10 PVR 3.6 Wood units Her cardiac output (Fick) 5.57  Review of Systems  Past Medical History:  Diagnosis Date  . CHF (congestive heart failure) (HCC)   . Hypertension   . Nephrotic syndrome   . Sarcoid      Family History  Problem Relation Age of Onset  . Asthma Sister   . Hypertension Paternal Grandmother      Social History   Socioeconomic History  . Marital status: Single    Spouse name: Not on file  . Number of children: 0  . Years of education: 3612  . Highest education level: Not on file  Social Needs  . Financial resource strain: Not on file  . Food insecurity - worry: Not on file  . Food insecurity - inability: Not on file  . Transportation needs - medical: Not on file  . Transportation needs - non-medical: Not on file  Occupational History  . Not on file  Tobacco Use  . Smoking status: Current Some Day Smoker    Packs/day: 0.10    Years: 10.00    Pack years: 1.00    Types: Cigarettes  . Smokeless tobacco: Never Used  Substance and Sexual Activity  . Alcohol use: Yes    Alcohol/week: 1.8 oz    Types: 3 Cans of beer per week  . Drug use:  No  . Sexual activity: Not on file  Other Topics Concern  . Not on file  Social History Narrative   Lives alone.  Currently homeless and staying on the 3rd floor of a hotel with an elevator.     No children.  Education: high school.       Allergies  Allergen Reactions  . Thalidomide Other (See Comments)    Derm,resp     Outpatient Medications Prior to Visit  Medication Sig Dispense Refill  . albuterol (PROVENTIL HFA;VENTOLIN HFA) 108 (90 Base) MCG/ACT inhaler Inhale 2 puffs into the  lungs every 6 (six) hours as needed for wheezing or shortness of breath. 1 Inhaler 5  . atorvastatin (LIPITOR) 40 MG tablet Take 1 tablet (40 mg total) daily at 6 PM by mouth. 90 tablet 2  . loratadine (CLARITIN) 10 MG tablet Take 1 tablet (10 mg total) by mouth daily. 30 tablet 0  . magnesium oxide (MAG-OX) 400 (241.3 Mg) MG tablet Take 1 tablet (400 mg total) every evening by mouth. 90 tablet 2  . metFORMIN (GLUCOPHAGE) 500 MG tablet Take 1 tablet (500 mg total) by mouth 2 (two) times daily with a meal. 180 tablet 3  . torsemide (DEMADEX) 20 MG tablet Take 2 tablets (40 mg total) by mouth daily. 90 tablet 6  . traMADol (ULTRAM) 50 MG tablet Take 1 tablet (50 mg total) every 6 (six) hours as needed by mouth. 15 tablet 0   No facility-administered medications prior to visit.         Objective:   Physical Exam Vitals:   02/14/17 1329 02/14/17 1330  BP:  122/80  Pulse:  76  SpO2:  96%  Weight: 261 lb (118.4 kg)   Height: 6' (1.829 m)    Gen: Pleasant, well-nourished, in no distress,  normal affect  ENT: No lesions,  mouth clear,  oropharynx clear, no postnasal drip  Neck: No JVD, no TMG, no carotid bruits  Lungs: No use of accessory muscles, no dullness to percussion, clear without rales or rhonchi  Cardiovascular: RRR, heart sounds normal, no murmur or gallops, no peripheral edema  Musculoskeletal: No deformities, no cyanosis or clubbing  Neuro: alert, non focal  Skin: Warm, no lesions or rashes     Assessment & Plan:  Obesity hypoventilation syndrome (HCC) Currently on BiPAP.  He did not have a titration study done during his split-night due to number of early evening apneas.  He still needs a pressure titration.  I believe he will need to fail CPAP in order to be titrated to BiPAP.  I will try to make a clear to the sleep lab that he has obesity hypoventilation syndrome and will need BiPAP data generated.  Sarcoidosis of lung (HCC) Some probable obstruction.  Is unclear  whether he benefited from the albuterol.  He is reticent to start a scheduled bronchodilator given some agitation and side effects from the albuterol.  I will defer for now.  Levy Pupa, MD, PhD 02/14/2017, 2:02 PM [facility] [phone] or if no answer [phone]

## 2017-02-14 NOTE — Telephone Encounter (Signed)
Patient called needing to know about an appointment for his Biopsy. Thanks

## 2017-02-14 NOTE — Telephone Encounter (Signed)
Patient informed that [facility] has his referral and they should be reviewing it and contacting him soon.

## 2017-02-14 NOTE — Telephone Encounter (Signed)
CSW received call from patient stating he has obtained housing and will be able to move in on Wednesday January 9th. Patient has an appointment in the HF clinic tomorrow and will follow up with CSW at that time. Lasandra BeechJackie Shon Mansouri, LCSW, [facility] [phone]

## 2017-02-14 NOTE — Telephone Encounter (Signed)
Patient wants to know about the appt for the biopsy please call

## 2017-02-15 ENCOUNTER — Other Ambulatory Visit (HOSPITAL_COMMUNITY): Payer: Self-pay

## 2017-02-15 ENCOUNTER — Ambulatory Visit (HOSPITAL_COMMUNITY)
Admission: RE | Admit: 2017-02-15 | Discharge: 2017-02-15 | Disposition: A | Discharge status: Home / Self Care | Payer: Medicaid Other | Source: Ambulatory Visit | Attending: Cardiology | Admitting: Cardiology

## 2017-02-15 ENCOUNTER — Encounter (HOSPITAL_COMMUNITY): Payer: Self-pay | Admitting: Cardiology

## 2017-02-15 ENCOUNTER — Other Ambulatory Visit: Payer: Self-pay

## 2017-02-15 VITALS — BP 152/83 | HR 84 | Wt 262.0 lb

## 2017-02-15 DIAGNOSIS — I110 Hypertensive heart disease with heart failure: Secondary | ICD-10-CM | POA: Diagnosis not present

## 2017-02-15 DIAGNOSIS — D860 Sarcoidosis of lung: Secondary | ICD-10-CM | POA: Diagnosis not present

## 2017-02-15 DIAGNOSIS — I509 Heart failure, unspecified: Secondary | ICD-10-CM | POA: Diagnosis not present

## 2017-02-15 DIAGNOSIS — Z7952 Long term (current) use of systemic steroids: Secondary | ICD-10-CM | POA: Insufficient documentation

## 2017-02-15 DIAGNOSIS — I5032 Chronic diastolic (congestive) heart failure: Secondary | ICD-10-CM

## 2017-02-15 DIAGNOSIS — Z590 Homelessness: Secondary | ICD-10-CM | POA: Diagnosis not present

## 2017-02-15 DIAGNOSIS — G4733 Obstructive sleep apnea (adult) (pediatric): Secondary | ICD-10-CM | POA: Diagnosis not present

## 2017-02-15 DIAGNOSIS — N183 Chronic kidney disease, stage 3 unspecified: Secondary | ICD-10-CM

## 2017-02-15 DIAGNOSIS — Z9981 Dependence on supplemental oxygen: Secondary | ICD-10-CM | POA: Diagnosis not present

## 2017-02-15 DIAGNOSIS — N049 Nephrotic syndrome with unspecified morphologic changes: Secondary | ICD-10-CM | POA: Insufficient documentation

## 2017-02-15 DIAGNOSIS — J9611 Chronic respiratory failure with hypoxia: Secondary | ICD-10-CM

## 2017-02-15 DIAGNOSIS — I50810 Right heart failure, unspecified: Secondary | ICD-10-CM

## 2017-02-15 DIAGNOSIS — E119 Type 2 diabetes mellitus without complications: Secondary | ICD-10-CM | POA: Insufficient documentation

## 2017-02-15 DIAGNOSIS — Z79899 Other long term (current) drug therapy: Secondary | ICD-10-CM | POA: Insufficient documentation

## 2017-02-15 DIAGNOSIS — I272 Pulmonary hypertension, unspecified: Secondary | ICD-10-CM | POA: Insufficient documentation

## 2017-02-15 DIAGNOSIS — Z7984 Long term (current) use of oral hypoglycemic drugs: Secondary | ICD-10-CM | POA: Insufficient documentation

## 2017-02-15 DIAGNOSIS — F17210 Nicotine dependence, cigarettes, uncomplicated: Secondary | ICD-10-CM | POA: Insufficient documentation

## 2017-02-15 LAB — BASIC METABOLIC PANEL
Anion gap: 6 (ref 5–15)
BUN: 30 mg/dL — AB (ref 6–20)
CHLORIDE: 100 mmol/L — AB (ref 101–111)
CO2: 32 mmol/L (ref 22–32)
CREATININE: 0.81 mg/dL (ref 0.61–1.24)
Calcium: 8.8 mg/dL — ABNORMAL LOW (ref 8.9–10.3)
GFR calc Af Amer: >60 mL/min (ref >60)
GLUCOSE: 84 mg/dL (ref 65–99)
POTASSIUM: 4.4 mmol/L (ref 3.5–5.1)
Sodium: 138 mmol/L (ref 135–145)

## 2017-02-15 NOTE — Patient Instructions (Signed)
Stop Atorvastatin   Labs drawn today (if we do not call you, then your lab work was stable)   Your physician recommends that you schedule a follow-up appointment in: 3 months with Dr. Shirlee LatchMcLean (we will call you)

## 2017-02-15 NOTE — Progress Notes (Signed)
Advanced Heart Failure Clinic Note   Primary Care: Joaquin CourtsKimberly Harris, FNP HF Cardiology: Dr. Shirlee LatchMcLean.   HPI: [name] is a 47 year old male with a past medical history of sarcoidosis, chronic hypoxemic respiratory failure (OSA/OHS), and focal segmental glomerulosclerosis with nephrotic syndrome. FSGS has been treated in the past with steroids, but this has been complicated by compliance as he is homeless. Last chest CT was in 3/18 and was suggestive of stage IV pulmonary sarcoidosis.  PFTs in 1/18 showed severe restriction and severe obstruction. Echo (9/18) showed normal LV EF with D-shaped interventricular septum and RV failure.  Admitted 11/05/16-11/17/16 with acute right heart failure/volume overload. Diuresed  50 pounds with IV Lasix. RHC results below.   He returns for followup of RV failure and sarcoidosis.  Weight is down 3 lbs since last appointment.  Minimal peripheral edema.  His main limitation recently has been proximal muscle weakness.  He has difficulty especially climbing stairs.  NCS/EMG was consistent with myopathy.  Muscle biopsy planned.  He stopped atorvastatin for about a week without improvement, now back on atorvastatin.  He is not short of breath walking on flat ground.  Able to shop in stores without difficulty.  Short of breath only with heavy exertion.  He is wearing 3 L oxygen during the day and Bipap at night.   Labs (11/18): BNP 161, K 4.2, creatinine 0.92 Labs (12/18): CK 670  Past Medical History: 1. Sarcoidosis: Diagnosed by skin biopsy.  - PFTs (1/18): Severe obstruction and severe restriction.  - CT chest (2016, 9/18): Consistent with stage IV pulmonary sarcoidosis.  2. OHS/OSA: 3L home O2 during day, Bipap at night.  3. Chronic diastolic CHF/RV failure: Echo (9/18) with EF 55-60%, septal flattening, dilated RV with moderately decreased systolic function.  PA pressure estimation likely inaccurate.  - RHC (10/18): mean RA 6, PA 49/20 mean 30, mean PCWP 10, CI  2.32, PVR 3.6 WU.  4. HTN 5. Focal segmental glomerulosclerosis: With nephrotic syndrome.  Followed by Dr. Kathrene BongoGoldsborough.  Has been treated with steroids and Prograf in the past.  6. Myopathy with proximal muscle weakness.  7. Type II diabetes.   Review of Systems: All systems reviewed and negative except as per HPI.    Current Outpatient Medications  Medication Sig Dispense Refill  . albuterol (PROVENTIL HFA;VENTOLIN HFA) 108 (90 Base) MCG/ACT inhaler Inhale 2 puffs into the lungs every 6 (six) hours as needed for wheezing or shortness of breath. 1 Inhaler 5  . loratadine (CLARITIN) 10 MG tablet Take 1 tablet (10 mg total) by mouth daily. 30 tablet 0  . magnesium oxide (MAG-OX) 400 (241.3 Mg) MG tablet Take 1 tablet (400 mg total) every evening by mouth. 90 tablet 2  . metFORMIN (GLUCOPHAGE) 500 MG tablet Take 1 tablet (500 mg total) by mouth 2 (two) times daily with a meal. 180 tablet 3  . torsemide (DEMADEX) 20 MG tablet Take 2 tablets (40 mg total) by mouth daily. 90 tablet 6  . traMADol (ULTRAM) 50 MG tablet Take 1 tablet (50 mg total) every 6 (six) hours as needed by mouth. 15 tablet 0   No current facility-administered medications for this encounter.     Allergies  Allergen Reactions  . Thalidomide Other (See Comments)    Derm,resp    Social History   Socioeconomic History  . Marital status: Single    Spouse name: Not on file  . Number of children: 0  . Years of education: 3912  . Highest education  level: Not on file  Social Needs  . Financial resource strain: Not on file  . Food insecurity - worry: Not on file  . Food insecurity - inability: Not on file  . Transportation needs - medical: Not on file  . Transportation needs - non-medical: Not on file  Occupational History  . Not on file  Tobacco Use  . Smoking status: Current Some Day Smoker    Packs/day: 0.10    Years: 10.00    Pack years: 1.00    Types: Cigarettes  . Smokeless tobacco: Never Used  Substance  and Sexual Activity  . Alcohol use: Yes    Alcohol/week: 1.8 oz    Types: 3 Cans of beer per week  . Drug use: No  . Sexual activity: Not on file  Other Topics Concern  . Not on file  Social History Narrative   Lives alone.  Currently homeless and staying on the 3rd floor of a hotel with an elevator.     No children.  Education: high school.      Family History  Problem Relation Age of Onset  . Asthma Sister   . Hypertension Paternal Grandmother     Vitals:   02/15/17 1400  BP: (!) 152/83  Pulse: 84  SpO2: 95%  Weight: 262 lb (118.8 kg)   PHYSICAL EXAM: General: NAD Neck: No JVD, no thyromegaly or thyroid nodule.  Lungs: Mild crackles at bases bilaterally. CV: Nondisplaced PMI.  Heart regular S1/S2, no S3/S4, no murmur.  No peripheral edema.  No carotid bruit.  Normal pedal pulses.  Abdomen: Soft, nontender, no hepatosplenomegaly, no distention.  Skin: Intact without lesions or rashes.  Neurologic: Alert and oriented x 3.  Psych: Normal affect. Extremities: No clubbing or cyanosis.  HEENT: Normal.   ASSESSMENT & PLAN:  1.OHS/OSA: He is on 3 L home oxygen and Bipap at night.  Followed by pulmonary.  - With history of sarcoidosis. Will follow up with Pulmonology.  2. Sarcoidosis: Stage IV pulmonary sarcoidosis by CT chest (unchanged in 9/18 compared to 2016).  Sarcoidosis plays a role in hypoxemia as well.  3. Chronic diastolic CHF/RV failure: RV dilated/dysfunctional on echo in 9/18 with D-shaped interventricular septum. After diuresis, filling pressures looked good on RHC 11/14/16.  On RHC, he had mild pulmonary hypertension with PVR 3.6 WU.  Possible component of group 5 PAH vs. Group 3 from OHS/OSA. PVR not particularly high and severe parenchymal disease from sarcoidosis, suspect that there is not going to be much benefit from treatment with selective pulmonary vasodilators.  - Volume looks ok and weight has been coming down. Continue torsemide 40 mg daily. BMET today.    - Continue oxygen to limit hypoxic pulmonary vasoconstriction.  4. Focal segmental glomerulosclerosis with nephrotic syndrome:  - Follows with nephrology.  - BMET today.   5. Myopathy: With proximal muscle weakness.  Planned for muscle biopsy soon. CK mildly elevated.  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  However, he has been on a statin that was stopped only briefly.  - I will stop atorvastatin for a longer period in case this is contributing.   Followup in 3 months.   Marca Ancona, MD 02/15/17

## 2017-02-15 NOTE — Progress Notes (Signed)
Paramedicine Encounter   Patient ID: Hunter [name] , male,   DOB: 08/28/1970,46 y.o.,  MRN: [id number]   [name] was seen in the clinic with Dr Shirlee LatchMcLean. He continues to have trouble restricting his fluid intake to 2 L/day. Per Dr Shirlee LatchMcLean, stop atorvastatin indefinitely due to weakness.  Fluid levels are acceptable per Dr Shirlee LatchMcLean. No other changes necessary at this time.  Time spent with patient: 30 minutes  Jacqualine CodeZackery R Christin Moline, EMT  02/15/2017   ACTION: Home visit completed Next visit planned for 1 week

## 2017-02-16 ENCOUNTER — Ambulatory Visit: Payer: Self-pay | Admitting: Family Medicine

## 2017-02-16 ENCOUNTER — Other Ambulatory Visit: Payer: Self-pay | Admitting: Family Medicine

## 2017-02-16 DIAGNOSIS — Z91849 Unspecified risk for dental caries: Secondary | ICD-10-CM

## 2017-02-16 NOTE — Progress Notes (Signed)
Patient recently approved for medicaid and requested a dental referral for routine teeth cleaning. Order placed. Advised that he doesn't need a referral for vision exam. Recommended [facility], [employer], or any ophthalmologist that accepts medicaid.  Godfrey PickKimberly S. Tiburcio PeaHarris, MSN, FNP-C [facility]  [address] [place], [place] [address] [phone]

## 2017-02-18 ENCOUNTER — Ambulatory Visit: Payer: Self-pay | Admitting: Podiatry

## 2017-02-21 MED FILL — TORSEMIDE 20 MG TABLET: 20 | 30 days supply | Qty: 90 | Fill #2

## 2017-02-21 MED FILL — MAGNESIUM OXIDE 400 MG TAB: 400 (240 MG | 30 days supply | Qty: 30 | Fill #2

## 2017-02-21 MED FILL — ?METFORMIN HCL 500MG TABLET: 500 | 30 days supply | Qty: 60 | Fill #3

## 2017-02-21 MED FILL — !VENTOLIN HFA INHALER: 108 (90 BAS | 25 days supply | Qty: 18 | Fill #2

## 2017-02-22 ENCOUNTER — Telehealth: Payer: Self-pay | Admitting: Emergency Medicine

## 2017-02-22 NOTE — Telephone Encounter (Signed)
I called and left patient the phone number for the sleep lab so he could resc his appt

## 2017-02-23 DIAGNOSIS — Z736 Limitation of activities due to disability: Secondary | ICD-10-CM

## 2017-02-24 ENCOUNTER — Telehealth (HOSPITAL_COMMUNITY): Payer: Self-pay

## 2017-02-24 ENCOUNTER — Telehealth: Payer: Self-pay | Admitting: Neurology

## 2017-02-24 NOTE — Telephone Encounter (Signed)
Patient wants to talk to someone about where we sent for his biopsy

## 2017-02-24 NOTE — Telephone Encounter (Signed)
Patient will call Huntley DecSara for his appointment time.

## 2017-02-24 NOTE — Telephone Encounter (Signed)
Huntley DecSara from [facility] called regarding this patient. Please call. Thanks

## 2017-02-24 NOTE — Telephone Encounter (Signed)
I called Mr Hunter [name] to schedule an appointment. He stated tomorrow at 14:00 would work best for him.

## 2017-02-25 ENCOUNTER — Other Ambulatory Visit (HOSPITAL_COMMUNITY): Payer: Self-pay

## 2017-02-25 NOTE — Progress Notes (Signed)
Paramedicine Encounter    Patient ID: Hunter [name], male    DOB: [birth year], 47 y.o.   MRN: [id number]   Patient Care Team: Hunter NeighborsHarris, Kimberly S, FNP as PCP - General (Family Medicine)  Patient Active Problem List   Diagnosis Date Noted  . 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 Chronic diastolic CHF (congestive heart failure) (HCC)   . Acute right-sided CHF (congestive heart failure) (HCC)   . Pleural effusion, right   . Acute renal failure with acute tubular necrosis superimposed on stage 3 chronic kidney disease (HCC)   . Prediabetes   . Sarcoidosis of lung (HCC)   . Generalized edema   . Orthostatic hypotension 11/14/2016  . RVF (right ventricular failure) (HCC)   . Pressure injury of skin 11/09/2016  . Acute on chronic respiratory failure with hypoxia and hypercapnia (HCC)   . Pulmonary edema 11/06/2016  . Restrictive lung disease secondary to obesity 11/06/2016  . Obesity hypoventilation syndrome (HCC) 11/06/2016  . OSA (obstructive sleep apnea) 11/06/2016  . CHF (congestive heart failure) (HCC) 11/05/2016  . HTN (hypertension) 11/02/2016  . Hypersomnia 03/02/2016  . Sarcoidosis 02/18/2016  . Hypoxemia 02/18/2016  . Morbid obesity (HCC) 02/18/2016    Current Outpatient Medications:  .  albuterol (PROVENTIL HFA;VENTOLIN HFA) 108 (90 Base) MCG/ACT inhaler, Inhale 2 puffs into the lungs every 6 (six) hours as needed for wheezing or shortness of breath., Disp: 1 Inhaler, Rfl: 5 .  loratadine (CLARITIN) 10 MG tablet, Take 1 tablet (10 mg total) by mouth daily., Disp: 30 tablet, Rfl: 0 .  magnesium oxide (MAG-OX) 400 (241.3 Mg) MG tablet, Take 1 tablet (400 mg total) every evening by mouth., Disp: 90 tablet, Rfl: 2 .  metFORMIN (GLUCOPHAGE) 500 MG tablet, Take 1 tablet (500 mg total) by mouth 2 (two) times daily with a meal., Disp: 180 tablet, Rfl: 3 .  torsemide (DEMADEX) 20 MG tablet, Take 2 tablets (40 mg total) by mouth daily., Disp: 90 tablet, Rfl: 6 .   traMADol (ULTRAM) 50 MG tablet, Take 1 tablet (50 mg total) every 6 (six) hours as needed by mouth., Disp: 15 tablet, Rfl: 0 Allergies  Allergen Reactions  . Thalidomide Other (See Comments)    Derm,resp      Social History   Socioeconomic History  . Marital status: Single    Spouse name: Not on file  . Number of children: 0  . Years of education: 7312  . Highest education level: Not on file  Social Needs  . Financial resource strain: Not on file  . Food insecurity - worry: Not on file  . Food insecurity - inability: Not on file  . Transportation needs - medical: Not on file  . Transportation needs - non-medical: Not on file  Occupational History  . Not on file  Tobacco Use  . Smoking status: Current Some Day Smoker    Packs/day: 0.10    Years: 10.00    Pack years: 1.00    Types: Cigarettes  . Smokeless tobacco: Never Used  Substance and Sexual Activity  . Alcohol use: Yes    Alcohol/week: 1.8 oz    Types: 3 Cans of beer per week  . Drug use: No  . Sexual activity: Not on file  Other Topics Concern  . Not on file  Social History Narrative   Lives alone.  Currently homeless and staying on the 3rd floor of a hotel with an elevator.     No children.  Education: high school.  Physical Exam  Constitutional: He is oriented to person, place, and time.  Cardiovascular: Normal rate and regular rhythm.  Pulmonary/Chest: Effort normal and breath sounds normal. No respiratory distress. He has no wheezes. He has no rales.  Musculoskeletal: Normal range of motion. He exhibits no edema.  Neurological: He is alert and oriented to person, place, and time.  Skin: Skin is warm and dry.  Psychiatric: He has a normal mood and affect.    SAFE - 01/21/17 1500      Situation   Admitting diagnosis  CHF    Heart failure history  Exisiting    Comorbidities  DM    Readmitted within 30 days  No    Hospital admission within past 12 months  Yes    number of hospital admissions  1     number of ED visits  2      Assessment   Lives alone  Yes    Primary support person  self    Mode of transportation  personal car    Other services involved  Home Health    Home equipement  Home O2;Scale      Weight   Weighs self daily  Yes    Scale provided  No      Resources   Has "Living better w/heart failure" book  Yes    Has HF Zone tool  Yes    Able to identify yellow zone signs/when to call Hunter [name]  Yes    Records zone daily  No      Medications   Uses a pill box  Yes    Who stocks the pill box  Paramedicine    Pill box checked this visit  Yes    Pill box refilled this visit  Yes    Difficulty obtaining medications  Yes    Barriers to medication  finance    Community Paramedic obtains medications from pharmacy  No    Mail order medications  No    Missed one or more doses of medications per week  No      Nutrition   Patient receives meals on wheels  No    Patient follows low sodium diet  Yes    Has foods at home that meet the current recommended diet  Yes    Patient follows low sugar/card diet  Yes    Nutritional concerns/issues  no      Activity Level   ADL'[name]/Mobility  Independent    How many feet can patient ambulate  5      Urine   Difficulty urinating  No    Changes in urine  None      Time spent with patient   Time spent with patient   45 Minutes         Future Appointments  Date Time Provider Department Center  03/01/2017 10:30 AM Hunter [name], DPM [facility]  03/14/2017  8:00 PM [facility] 3 [facility]  07/25/2017 11:00 AM Hunter Neighbors, FNP [facility] None    BP 126/78 (BP Location: Left Arm, Patient Position: Sitting, Cuff Size: Large)   Pulse 90   Resp 16   Wt 254 lb (115.2 kg)   SpO2 95%   BMI 34.45 kg/m   Weight yesterday- 252 lb Last visit weight- 262 lb   Hunter [name] was seen at home today and reported feeling generally well. He still has muscle weakness despite stopping atorvastatin. [facility]  delivers oxygen and he wants  to see if he can get a battery powered oxygen condenser. I spoke to Hunter [name] who advised they just needed an order and they could get it for him. I relayed this to Hunter [name] at the clinic who advised he would have Susie send the Rx. Check on compression stockings ($75 out of pocket). Get handicap sticker. His medications were verified but he filled his own pillbox prior to my arrival.   Time spent with patient: 41 minutes  Jacqualine Code, EMT 02/25/17  ACTION: Home visit completed Next visit planned for 1 week

## 2017-02-28 ENCOUNTER — Other Ambulatory Visit (HOSPITAL_COMMUNITY): Payer: Self-pay | Admitting: *Deleted

## 2017-02-28 ENCOUNTER — Ambulatory Visit: Payer: Self-pay | Admitting: Surgery

## 2017-02-28 NOTE — H&P (Signed)
History of Present Illness Hunter [name](Hunter [name]; 02/28/2017 12:19 PM) The patient is a 47 year old male who presents with a complaint of weakness. Referred by Hunter [name] and Hunter CourtsKimberly Harris, FNP for muscle biopsy  This is a 47 year old male with multiple significant comorbidities who presents with recent onset of bilateral thigh muscle weakness. He is undergoing evaluation by neurology. They have requested muscle biopsy of his rectus femoris. When I review their notes, there is some mention of a biopsy of his deltoid muscle as well. However the patient states that he is having no problems with his upper extremities. We will contact Dr. Allena KatzPatel to clarify this. For now we will plan to do a thigh muscle biopsy.  The patient is quite unclear about the purpose of today's visit. He thought that we were going to do some sort of needle biopsy today and that it would be a quick in and out procedure. He does not understand that this is a surgical biopsy that would require anesthesia and an incision. He seems a bit reluctant to proceed with surgery initially.   Past Surgical History (Hunter [name], CMA; 02/28/2017 11:30 AM) No pertinent past surgical history   Allergies (Hunter [name], CMA; 02/28/2017 11:34 AM) No Known Drug Allergies [02/28/2017]:  Medication History (Hunter [name], CMA; 02/28/2017 11:35 AM) Ventolin HFA (108 (90 Base)MCG/ACT Aerosol Soln, Inhalation) Active. MetFORMIN HCl (500MG  Tablet, Oral) Active. Torsemide (20MG  Tablet, Oral) Active. TraMADol HCl (50MG  Tablet, Oral) Active.  Social History (Hunter [name], CMA; 02/28/2017 11:30 AM) Alcohol use  Occasional alcohol use. Caffeine use  Coffee. No drug use  Tobacco use  Current some day smoker.  Family History (Hunter [name], CMA; 02/28/2017 11:30 AM) Family history unknown  First Degree Relatives   Other Problems (Hunter [name], CMA; 02/28/2017 11:30 AM) Chronic Renal Failure Syndrome  Congestive  Heart Failure  High blood pressure  Home Oxygen Use  Hypercholesterolemia  Sleep Apnea     Review of Systems (Hunter [name] CMA; 02/28/2017 11:30 AM) General Present- Fatigue. Not Present- Appetite Loss, Chills, Fever, Night Sweats, Weight Gain and Weight Loss. Skin Not Present- Change in Wart/Mole, Dryness, Hives, Jaundice, New Lesions, Non-Healing Wounds, Rash and Ulcer. HEENT Not Present- Earache, Hearing Loss, Hoarseness, Nose Bleed, Oral Ulcers, Ringing in the Ears, Seasonal Allergies, Sinus Pain, Sore Throat, Visual Disturbances, Wears glasses/contact lenses and Yellow Eyes. Respiratory Not Present- Bloody sputum, Chronic Cough, Difficulty Breathing, Snoring and Wheezing. Breast Not Present- Breast Mass, Breast Pain, Nipple Discharge and Skin Changes. Cardiovascular Present- Shortness of Breath and Swelling of Extremities. Not Present- Chest Pain, Difficulty Breathing Lying Down, Leg Cramps, Palpitations and Rapid Heart Rate. Gastrointestinal Not Present- Abdominal Pain, Bloating, Bloody Stool, Change in Bowel Habits, Chronic diarrhea, Constipation, Difficulty Swallowing, Excessive gas, Gets full quickly at meals, Hemorrhoids, Indigestion, Nausea, Rectal Pain and Vomiting. Male Genitourinary Not Present- Blood in Urine, Change in Urinary Stream, Frequency, Impotence, Nocturia, Painful Urination, Urgency and Urine Leakage.  Vitals (Hunter [name] CMA; 02/28/2017 11:35 AM) 02/28/2017 11:35 AM Weight: 286 lb Height: 71in Body Surface Area: 2.46 m Body Mass Index: 39.89 kg/m  Pulse: 88 (Regular)  BP: 120/80 (Sitting, Left Arm, Standard)       Physical Exam Hunter [name](Hunter [name]; 02/28/2017 12:19 PM) The physical exam findings are as follows: Note:WDWN Obese male in NAD Right thigh - no incisions or masses anteriorly Non-tender    Assessment & Plan Hunter [name](Hunter [name]; 02/28/2017 12:20 PM) PROXIMAL MUSCLE WEAKNESS (M62.81) Current Plans Schedule for Surgery -  Right thigh muscle biopsy. The surgical procedure has been discussed with the patient. Potential risks, benefits, alternative treatments, and expected outcomes have been explained. All of the patient's questions at this time have been answered. The likelihood of reaching the patient's treatment goal is good. The patient understand the proposed surgical procedure and wishes to proceed. Note:We will plan right thigh muscle biopsy. I sent Dr. Allena [name] message to see whether she needs to have a deltoid biopsy as well. This will need to be explained to the patient if a deltoid biopsy is also requested. The patient has a history of noncompliance with medical regimen as well as follow-up visits. I a Patient that I would not be able to help interpret the biopsy results but that I would merely provide the technical expertise to perform the biopsy.    Hunter [name]. Hunter [name], [name], [facility] Surgery  General/ Trauma Surgery  02/28/2017 12:24 PM

## 2017-03-01 ENCOUNTER — Ambulatory Visit (INDEPENDENT_AMBULATORY_CARE_PROVIDER_SITE_OTHER): Payer: Medicaid Other

## 2017-03-01 ENCOUNTER — Encounter: Payer: Self-pay | Admitting: Podiatry

## 2017-03-01 ENCOUNTER — Ambulatory Visit (INDEPENDENT_AMBULATORY_CARE_PROVIDER_SITE_OTHER): Payer: Medicaid Other | Admitting: Podiatry

## 2017-03-01 ENCOUNTER — Telehealth (HOSPITAL_COMMUNITY): Payer: Self-pay

## 2017-03-01 ENCOUNTER — Telehealth: Payer: Self-pay

## 2017-03-01 DIAGNOSIS — M7752 Other enthesopathy of left foot: Secondary | ICD-10-CM | POA: Diagnosis not present

## 2017-03-01 DIAGNOSIS — R29898 Other symptoms and signs involving the musculoskeletal system: Secondary | ICD-10-CM | POA: Diagnosis not present

## 2017-03-01 DIAGNOSIS — B351 Tinea unguium: Secondary | ICD-10-CM

## 2017-03-01 DIAGNOSIS — M7751 Other enthesopathy of right foot: Secondary | ICD-10-CM | POA: Diagnosis not present

## 2017-03-01 DIAGNOSIS — M659 Synovitis and tenosynovitis, unspecified: Secondary | ICD-10-CM | POA: Diagnosis not present

## 2017-03-01 DIAGNOSIS — M779 Enthesopathy, unspecified: Secondary | ICD-10-CM | POA: Diagnosis not present

## 2017-03-01 DIAGNOSIS — M19071 Primary osteoarthritis, right ankle and foot: Secondary | ICD-10-CM | POA: Diagnosis not present

## 2017-03-01 MED ORDER — DICLOFENAC SODIUM 1 % TD GEL: 2.0000 g | Freq: Four times a day (QID) | TRANSDERMAL | 0 refills | Status: DC

## 2017-03-01 MED ORDER — CICLOPIROX 8 % EX SOLN: Freq: Every day | CUTANEOUS | 0 refills | Status: DC

## 2017-03-01 MED FILL — CICLOPIROX 8% SOLUTION: 8 | 30 days supply | Qty: 7 | Fill #0

## 2017-03-01 MED FILL — DICLOFENAC SODIUM 1% GEL: 1 | 12 days supply | Qty: 100 | Fill #0

## 2017-03-01 NOTE — Patient Instructions (Signed)
The codes for your orthotics would be L3020 if you want to call your insurance company.

## 2017-03-01 NOTE — Telephone Encounter (Signed)
I called Mr Hunter [name] to schedule an appointment. He did not answer so I left a voicemail requesting he call me back.

## 2017-03-02 ENCOUNTER — Telehealth: Payer: Self-pay | Admitting: Podiatry

## 2017-03-02 ENCOUNTER — Ambulatory Visit: Payer: Self-pay | Admitting: Surgery

## 2017-03-02 ENCOUNTER — Other Ambulatory Visit (HOSPITAL_COMMUNITY): Payer: Self-pay

## 2017-03-02 NOTE — Telephone Encounter (Signed)
Nash DimmerKerry left voicemail that pt called them stating they needed to send us something with dx for him to get inserts.   I returned call and  She said pt told her they needed to send us something stating he is pre diabetic so he could get inserts.I explained to her pt has medicaid and they will not cover orthotics or diabetic shoes even if he is diabetic or pre diabetic. Medicaid does not cover any dme products.

## 2017-03-02 NOTE — Telephone Encounter (Signed)
[facility] regarding patient appointment and what they need from us.

## 2017-03-02 NOTE — Progress Notes (Signed)
Subjective:   Patient ID: Hunter [name], male   DOB: 47 y.o.   MRN: [id number]   HPI Hunter [name] presents the office today for concerns of pain to his feet.  He is requesting orthotics.  He states that he feels unbalanced on his feet and he has a history of standing or wearing steel toed shoes for several years but may have contributed to it.  He has tenderness and pain to his feet over the last 2 years.  He is also noticed weakness to his legs and he has difficulty walking or getting up from a seated position.  In regards to his feet he does not feel comfortable wearing shoes.  He has tried numerous shoes without any significant improvement.  Later in the appointment he did admit that he has been seeing physical therapy for the last couple months without any improvement in regards to the muscles in his legs.  He is also seen neurology and they recommended a muscle biopsy.  This is not yet scheduled.  After I left the room I went back into the room and he had concerns of discolored toenails.  His nails are thick, discolored and elongated which cause irritation side shoes.  He was inquiring about possible treatment and trimming of the nails.   Review of Systems  All other systems reviewed and are negative.       Objective:  Physical Exam  General: AAO x3, NAD  Dermatological: Nails are hypertrophic, dystrophic, brittle, discolored, elongated 10. No surrounding redness or drainage. Subjectively the nails get tender and set of issues as they rub as they get thick.  No open lesions or pre-ulcerative lesions are identified today.  Vascular: Dorsalis Pedis artery and Posterior Tibial artery pedal pulses are 2/4 bilateral with immedate capillary fill time.  There is no pain with calf compression, swelling, warmth, erythema.   Neruologic: Grossly intact via light touch bilateral.  Protective threshold with Semmes Wienstein monofilament intact to all pedal sites bilateral.   Musculoskeletal: There is a  decrease in medial arch height upon weightbearing.  Upon palpation of dorsal medial midfoot along the toe navicular joint subjectively is where he gets discomfort but he there is no tenderness palpation of the area today.  Also subjective tenderness to the fifth metatarsal base.  Peroneal tendon appears to be intact.  Also some mild discomfort in the arch of the foot upon weightbearing and pressure but no pain on nonweightbearing exam.  There is no area of tenderness while nonweightbearing all the symptoms or with weightbearing.  Ankle, subtalar joint range of motion intact.  Equinus is present.  Muscular strength 5/5 in all groups tested bilateral.  Gait: Unassisted, Nonantalgic.       Assessment:   Bilateral foot pain likely biomechanical in nature resulting in plantar fasciitis, tendinitis; with osteoarthritis midfoot; onychomycosis     Plan:  -Treatment options discussed including all alternatives, risks, and complications -Etiology of symptoms were discussed -X-rays were obtained and reviewed with the patient.  Arthritic changes mild on the talar navicular joint, mid foot.  There is no evidence of acute fracture identified. -Regards to his overall foot pain I do recommend orthotics.  However his insurance does not cover them he does not want to pay for these.  He was very upset about this and I tried to explain insurance.  I also gave him the codes that he can try to contact the insurance company himself.  If we cannot do a custom insert we discussed various  over-the-counter inserts to try to help.  We also discussed the change in shoe which may be helpful for him as well and discussed various types.  Prescribed Voltaren gel as she is asking for something to help with the pain.  This is a chronic issue and been ongoing for over 2 years and if he needs pain medicine he is to see his primary care physician or pain management. -He is following up with neurology and they recommend muscle biopsy for  the leg weakness.  This is likely contributing to the foot symptoms as well.  Will defer to neurology for the leg weakness as they are been seeing him for this. -In regard to the toenails given his medical conditions will hold off on oral Lamisil we discussed various topical antifungals.  Prescribed Penlac.  Discussed side effects and success rates.  As a courtesy I did trim his nails so that any complications or bleeding.  Vivi Barrack DPM

## 2017-03-02 NOTE — Telephone Encounter (Signed)
[facility] diabetic shoes or inserts. Left a vm for patient to callback

## 2017-03-02 NOTE — Telephone Encounter (Signed)
Patient given information and the number to medicaid to call and verify information was correct

## 2017-03-02 NOTE — Progress Notes (Signed)
Paramedicine Encounter    Patient ID: Nani GasserRobert Kenealy, male    DOB: 29-Jul-1970, 47 y.o.   MRN: [id number]   Patient Care Team: Bing NeighborsHarris, Kimberly S, FNP as PCP - General (Family Medicine)  Patient Active Problem List   Diagnosis Date Noted  . 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 Chronic diastolic CHF (congestive heart failure) (HCC)   . Acute right-sided CHF (congestive heart failure) (HCC)   . Pleural effusion, right   . Acute renal failure with acute tubular necrosis superimposed on stage 3 chronic kidney disease (HCC)   . Prediabetes   . Sarcoidosis of lung (HCC)   . Generalized edema   . Orthostatic hypotension 11/14/2016  . RVF (right ventricular failure) (HCC)   . Pressure injury of skin 11/09/2016  . Acute on chronic respiratory failure with hypoxia and hypercapnia (HCC)   . Pulmonary edema 11/06/2016  . Restrictive lung disease secondary to obesity 11/06/2016  . Obesity hypoventilation syndrome (HCC) 11/06/2016  . OSA (obstructive sleep apnea) 11/06/2016  . CHF (congestive heart failure) (HCC) 11/05/2016  . HTN (hypertension) 11/02/2016  . Hypersomnia 03/02/2016  . Sarcoidosis 02/18/2016  . Hypoxemia 02/18/2016  . Morbid obesity (HCC) 02/18/2016    Current Outpatient Medications:  .  albuterol (PROVENTIL HFA;VENTOLIN HFA) 108 (90 Base) MCG/ACT inhaler, Inhale 2 puffs into the lungs every 6 (six) hours as needed for wheezing or shortness of breath., Disp: 1 Inhaler, Rfl: 5 .  ciclopirox (PENLAC) 8 % solution, Apply topically at bedtime. Apply over nail and surrounding skin. Apply daily over previous coat. After seven (7) days, may remove with alcohol and continue cycle., Disp: 6.6 mL, Rfl: 0 .  diclofenac sodium (VOLTAREN) 1 % GEL, Apply 2 g topically 4 (four) times daily. Rub into affected area of foot 2 to 4 times daily, Disp: 100 g, Rfl: 0 .  loratadine (CLARITIN) 10 MG tablet, Take 1 tablet (10 mg total) by mouth daily., Disp: 30 tablet, Rfl: 0 .   magnesium oxide (MAG-OX) 400 (241.3 Mg) MG tablet, Take 1 tablet (400 mg total) every evening by mouth., Disp: 90 tablet, Rfl: 2 .  metFORMIN (GLUCOPHAGE) 500 MG tablet, Take 1 tablet (500 mg total) by mouth 2 (two) times daily with a meal., Disp: 180 tablet, Rfl: 3 .  torsemide (DEMADEX) 20 MG tablet, Take 2 tablets (40 mg total) by mouth daily., Disp: 90 tablet, Rfl: 6 .  traMADol (ULTRAM) 50 MG tablet, Take 1 tablet (50 mg total) every 6 (six) hours as needed by mouth., Disp: 15 tablet, Rfl: 0 .  atorvastatin (LIPITOR) 40 MG tablet, TAKE 1 TABLET  40 MG TOTAL  DAILY AT 6 PM BY MOUTH, Disp: , Rfl: 2 .  magnesium oxide (MAG-OX) 400 MG tablet, TAKE 1 TABLET  400 MG TOTAL  EVERY EVENING BY MOUTH, Disp: , Rfl: 2 Allergies  Allergen Reactions  . Thalidomide Other (See Comments)    Derm,resp      Social History   Socioeconomic History  . Marital status: Single    Spouse name: Not on file  . Number of children: 0  . Years of education: 2012  . Highest education level: Not on file  Social Needs  . Financial resource strain: Not on file  . Food insecurity - worry: Not on file  . Food insecurity - inability: Not on file  . Transportation needs - medical: Not on file  . Transportation needs - non-medical: Not on file  Occupational History  . Not on file  Tobacco Use  . Smoking status: Current Some Day Smoker    Packs/day: 0.10    Years: 10.00    Pack years: 1.00    Types: Cigarettes  . Smokeless tobacco: Never Used  Substance and Sexual Activity  . Alcohol use: Yes    Alcohol/week: 1.8 oz    Types: 3 Cans of beer per week  . Drug use: No  . Sexual activity: Not on file  Other Topics Concern  . Not on file  Social History Narrative   Lives alone.  Currently homeless and staying on the 3rd floor of a hotel with an elevator.     No children.  Education: high school.      Physical Exam  Constitutional: He is oriented to person, place, and time.  Neck: No JVD present.   Cardiovascular: Normal rate and regular rhythm.  Pulmonary/Chest: Effort normal and breath sounds normal. No respiratory distress. He has no wheezes. He has no rales.  Abdominal: Soft.  Musculoskeletal: Normal range of motion. He exhibits no edema.  Neurological: He is alert and oriented to person, place, and time.  Skin: Skin is warm and dry.  Psychiatric: He has a normal mood and affect.    SAFE - 01/21/17 1500      Situation   Admitting diagnosis  CHF    Heart failure history  Exisiting    Comorbidities  DM    Readmitted within 30 days  No    Hospital admission within past 12 months  Yes    number of hospital admissions  1    number of ED visits  2      Assessment   Lives alone  Yes    Primary support person  self    Mode of transportation  personal car    Other services involved  Home Health    Home equipement  Home O2;Scale      Weight   Weighs self daily  Yes    Scale provided  No      Resources   Has "Living better w/heart failure" book  Yes    Has HF Zone tool  Yes    Able to identify yellow zone signs/when to call MD  Yes    Records zone daily  No      Medications   Uses a pill box  Yes    Who stocks the pill box  Paramedicine    Pill box checked this visit  Yes    Pill box refilled this visit  Yes    Difficulty obtaining medications  Yes    Barriers to medication  finance    Community Paramedic obtains medications from pharmacy  No    Mail order medications  No    Missed one or more doses of medications per week  No      Nutrition   Patient receives meals on wheels  No    Patient follows low sodium diet  Yes    Has foods at home that meet the current recommended diet  Yes    Patient follows low sugar/card diet  Yes    Nutritional concerns/issues  no      Activity Level   ADL's/Mobility  Independent    How many feet can patient ambulate  5      Urine   Difficulty urinating  No    Changes in urine  None      Time spent with patient   Time  spent with patient  45 Minutes         Future Appointments  Date Time Provider Department Center  03/14/2017  8:00 PM [facility] 3 [facility]  04/29/2017  1:15 PM Vivi Barrack, DPM [facility]  07/25/2017 11:00 AM Bing Neighbors, FNP [facility] None    BP 122/80 (BP Location: Right Arm, Patient Position: Sitting, Cuff Size: Large)   Pulse 90   Resp 18   Wt 254 lb (115.2 kg)   SpO2 98%   BMI 34.45 kg/m   Weight yesterday- 252 lb Last visit weight- 254 lb  I saw Mr Kallman at home today and her reported feeling generally well. He stated he has been compliant with his medications and felt comfortable filling his own pillbox. We went over his medications and I gave him his handicap plate information to take to the [facility].   Time spent with patient: 20 minutes  Jacqualine Code, EMT 03/02/17  ACTION: Home visit completed Next visit planned for 1 week

## 2017-03-03 ENCOUNTER — Telehealth: Payer: Self-pay

## 2017-03-03 NOTE — Telephone Encounter (Signed)
Patient states that he needs a order stating he needs orthotic inserts to have Medicaid cover them. Patient is aware that you are out of office until Friday.

## 2017-03-04 ENCOUNTER — Telehealth: Payer: Self-pay | Admitting: Podiatry

## 2017-03-04 NOTE — Telephone Encounter (Signed)
Pt called and asked me for a rx for orthotics. I told him I would give you the message and when it is done I would call him to come pick it up.

## 2017-03-04 NOTE — Telephone Encounter (Signed)
Pt called states he has a company that will cover his orthotics if Dr. Ardelle AntonWagoner will write rx, and fax to [phone].

## 2017-03-04 NOTE — Telephone Encounter (Signed)
I told pt I would inform pt when Dr. Ardelle AntonWagoner wrote the rx and fax. Pt states he would like to pick up the rx and I told him I would call him when it was ready.

## 2017-03-04 NOTE — Telephone Encounter (Signed)
Gave to Hunter [name].

## 2017-03-04 NOTE — Telephone Encounter (Signed)
[facility] has the prescription.

## 2017-03-06 ENCOUNTER — Encounter (HOSPITAL_BASED_OUTPATIENT_CLINIC_OR_DEPARTMENT_OTHER): Payer: Self-pay

## 2017-03-07 ENCOUNTER — Telehealth: Payer: Self-pay | Admitting: Podiatry

## 2017-03-07 NOTE — Telephone Encounter (Signed)
Pt will come in to pick up Rx on Tuesday 1/29, not feeling well right now. Per pt it should be waiting at front desk.

## 2017-03-09 ENCOUNTER — Telehealth: Payer: Self-pay | Admitting: Family Medicine

## 2017-03-09 MED ORDER — TRI-BALANCE ORTHOTICS MENS MISC: 0 refills | Status: DC

## 2017-03-09 NOTE — Telephone Encounter (Signed)
See prior note

## 2017-03-09 NOTE — Telephone Encounter (Signed)
Prescription for orthotics completed. Contact patient he can pick-up prescription.  Godfrey PickKimberly S. Tiburcio PeaHarris, MSN, FNP-C [facility]  [address] [place], [place] [address] [phone]

## 2017-03-09 NOTE — Telephone Encounter (Signed)
Patient will come by office next week to pickup order

## 2017-03-11 ENCOUNTER — Other Ambulatory Visit (HOSPITAL_COMMUNITY): Payer: Self-pay

## 2017-03-11 MED FILL — traMADol HCL 50 MG TABS: 50 | 30 days supply | Qty: 60 | Fill #0

## 2017-03-11 NOTE — Progress Notes (Signed)
Paramedicine Encounter    Patient ID: Hunter [name], male    DOB: Mar 15, 1970, 47 y.o.   MRN: [id number]   Patient Care Team: Bing Neighbors, FNP as PCP - General (Family Medicine)  Patient Active Problem List   Diagnosis Date Noted  . 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 Chronic diastolic CHF (congestive heart failure) (HCC)   . Acute right-sided CHF (congestive heart failure) (HCC)   . Pleural effusion, right   . Acute renal failure with acute tubular necrosis superimposed on stage 3 chronic kidney disease (HCC)   . Prediabetes   . Sarcoidosis of lung (HCC)   . Generalized edema   . Orthostatic hypotension 11/14/2016  . RVF (right ventricular failure) (HCC)   . Pressure injury of skin 11/09/2016  . Acute on chronic respiratory failure with hypoxia and hypercapnia (HCC)   . Pulmonary edema 11/06/2016  . Restrictive lung disease secondary to obesity 11/06/2016  . Obesity hypoventilation syndrome (HCC) 11/06/2016  . OSA (obstructive sleep apnea) 11/06/2016  . CHF (congestive heart failure) (HCC) 11/05/2016  . HTN (hypertension) 11/02/2016  . Hypersomnia 03/02/2016  . Sarcoidosis 02/18/2016  . Hypoxemia 02/18/2016  . Morbid obesity (HCC) 02/18/2016    Current Outpatient Medications:  .  albuterol (PROVENTIL HFA;VENTOLIN HFA) 108 (90 Base) MCG/ACT inhaler, Inhale 2 puffs into the lungs every 6 (six) hours as needed for wheezing or shortness of breath., Disp: 1 Inhaler, Rfl: 5 .  loratadine (CLARITIN) 10 MG tablet, Take 1 tablet (10 mg total) by mouth daily., Disp: 30 tablet, Rfl: 0 .  magnesium oxide (MAG-OX) 400 (241.3 Mg) MG tablet, Take 1 tablet (400 mg total) every evening by mouth., Disp: 90 tablet, Rfl: 2 .  metFORMIN (GLUCOPHAGE) 500 MG tablet, Take 1 tablet (500 mg total) by mouth 2 (two) times daily with a meal., Disp: 180 tablet, Rfl: 3 .  torsemide (DEMADEX) 20 MG tablet, Take 2 tablets (40 mg total) by mouth daily., Disp: 90 tablet, Rfl: 6 .   traMADol (ULTRAM) 50 MG tablet, Take 1 tablet (50 mg total) every 6 (six) hours as needed by mouth., Disp: 15 tablet, Rfl: 0 .  atorvastatin (LIPITOR) 40 MG tablet, TAKE 1 TABLET  40 MG TOTAL  DAILY AT 6 PM BY MOUTH, Disp: , Rfl: 2 .  ciclopirox (PENLAC) 8 % solution, Apply topically at bedtime. Apply over nail and surrounding skin. Apply daily over previous coat. After seven (7) days, may remove with alcohol and continue cycle. (Patient not taking: Reported on 03/11/2017), Disp: 6.6 mL, Rfl: 0 .  diclofenac sodium (VOLTAREN) 1 % GEL, Apply 2 g topically 4 (four) times daily. Rub into affected area of foot 2 to 4 times daily (Patient not taking: Reported on 03/11/2017), Disp: 100 g, Rfl: 0 .  Foot Care Products (TRI-BALANCE ORTHOTICS MENS) MISC, Dispense 1 pair of orthotic inserts to facilitate improved balance, gait, and mobility. (Patient not taking: Reported on 03/11/2017), Disp: 2 each, Rfl: 0 .  magnesium oxide (MAG-OX) 400 MG tablet, TAKE 1 TABLET  400 MG TOTAL  EVERY EVENING BY MOUTH, Disp: , Rfl: 2 Allergies  Allergen Reactions  . Thalidomide Other (See Comments)    Derm,resp      Social History   Socioeconomic History  . Marital status: Single    Spouse name: Not on file  . Number of children: 0  . Years of education: 35  . Highest education level: Not on file  Social Needs  . Financial resource strain: Not on  file  . Food insecurity - worry: Not on file  . Food insecurity - inability: Not on file  . Transportation needs - medical: Not on file  . Transportation needs - non-medical: Not on file  Occupational History  . Not on file  Tobacco Use  . Smoking status: Current Some Day Smoker    Packs/day: 0.10    Years: 10.00    Pack years: 1.00    Types: Cigarettes  . Smokeless tobacco: Never Used  Substance and Sexual Activity  . Alcohol use: Yes    Alcohol/week: 1.8 oz    Types: 3 Cans of beer per week  . Drug use: No  . Sexual activity: Not on file  Other Topics Concern   . Not on file  Social History Narrative   Lives alone.  Currently homeless and staying on the 3rd floor of a hotel with an elevator.     No children.  Education: high school.      Physical Exam  Constitutional: He is oriented to person, place, and time.  Cardiovascular: Normal rate and regular rhythm.  Pulmonary/Chest: Effort normal and breath sounds normal. No respiratory distress. He has no wheezes. He has no rales.  Abdominal: Soft.  Musculoskeletal: Normal range of motion. He exhibits no edema.  Neurological: He is alert and oriented to person, place, and time.  Skin: Skin is warm and dry.  Psychiatric: He has a normal mood and affect.    SAFE - 01/21/17 1500      Situation   Admitting diagnosis  CHF    Heart failure history  Exisiting    Comorbidities  DM    Readmitted within 30 days  No    Hospital admission within past 12 months  Yes    number of hospital admissions  1    number of ED visits  2      Assessment   Lives alone  Yes    Primary support person  self    Mode of transportation  personal car    Other services involved  Home Health    Home equipement  Home O2;Scale      Weight   Weighs self daily  Yes    Scale provided  No      Resources   Has "Living better w/heart failure" book  Yes    Has HF Zone tool  Yes    Able to identify yellow zone signs/when to call MD  Yes    Records zone daily  No      Medications   Uses a pill box  Yes    Who stocks the pill box  Paramedicine    Pill box checked this visit  Yes    Pill box refilled this visit  Yes    Difficulty obtaining medications  Yes    Barriers to medication  finance    Community Paramedic obtains medications from pharmacy  No    Mail order medications  No    Missed one or more doses of medications per week  No      Nutrition   Patient receives meals on wheels  No    Patient follows low sodium diet  Yes    Has foods at home that meet the current recommended diet  Yes    Patient follows low  sugar/card diet  Yes    Nutritional concerns/issues  no      Activity Level   ADL's/Mobility  Independent    How many  feet can patient ambulate  5      Urine   Difficulty urinating  No    Changes in urine  None      Time spent with patient   Time spent with patient   45 Minutes         Future Appointments  Date Time Provider Department Center  03/14/2017  8:00 PM [facility] 3 [facility]  04/29/2017  1:15 PM Vivi Barrack, DPM [facility]  07/25/2017 11:00 AM Bing Neighbors, FNP [facility] None    BP 124/82 (BP Location: Left Arm, Patient Position: Sitting, Cuff Size: Large)   Pulse 78   Resp 16   Wt 252 lb (114.3 kg)   SpO2 95%   BMI 34.18 kg/m   Weight yesterday- 254 lb Last visit weight- 254 lb CBG- 127 mg/dL  Mr Gura was seen at home today and reported feeling well. He denied SOB, dizziness, headaches or orthopnea. He has been compliant with his medications, which were verified, and he is filling his own pillbox. He was unable to get the portable oxygen condenser from [facility] last week so I will contact them to find out what is going on there. He said they told him they do not have the portable units and he would have to find another company if he insisted on having one. I believe that Mr Lovingood is nearing completion of the [facility] program at this point and I will discuss this further with him next week.   Time spent with patient: 30 minutes  Jacqualine Code, EMT 03/11/17  ACTION: Home visit completed Next visit planned for 1 week

## 2017-03-14 ENCOUNTER — Ambulatory Visit (HOSPITAL_BASED_OUTPATIENT_CLINIC_OR_DEPARTMENT_OTHER): Payer: Medicaid Other | Attending: Emergency Medicine | Admitting: Pulmonary Disease

## 2017-03-14 DIAGNOSIS — G4733 Obstructive sleep apnea (adult) (pediatric): Secondary | ICD-10-CM | POA: Diagnosis present

## 2017-03-14 DIAGNOSIS — J9611 Chronic respiratory failure with hypoxia: Secondary | ICD-10-CM | POA: Diagnosis not present

## 2017-03-14 DIAGNOSIS — J9612 Chronic respiratory failure with hypercapnia: Secondary | ICD-10-CM | POA: Diagnosis not present

## 2017-03-14 DIAGNOSIS — Z79899 Other long term (current) drug therapy: Secondary | ICD-10-CM | POA: Insufficient documentation

## 2017-03-14 DIAGNOSIS — D869 Sarcoidosis, unspecified: Secondary | ICD-10-CM | POA: Insufficient documentation

## 2017-03-18 ENCOUNTER — Other Ambulatory Visit (HOSPITAL_COMMUNITY): Payer: Self-pay

## 2017-03-18 NOTE — Progress Notes (Signed)
Paramedicine Encounter    Patient ID: Hunter [name], male    DOB:Nani Gasser [birth year], 47 y.o.   MRN: [id number]   Patient Care Team: Bing NeighborsHarris, Kimberly S, FNP as PCP - General (Family Medicine)  Patient Active Problem List   Diagnosis Date Noted  . 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 Chronic diastolic CHF (congestive heart failure) (HCC)   . Acute right-sided CHF (congestive heart failure) (HCC)   . Pleural effusion, right   . Acute renal failure with acute tubular necrosis superimposed on stage 3 chronic kidney disease (HCC)   . Prediabetes   . Sarcoidosis of lung (HCC)   . Generalized edema   . Orthostatic hypotension 11/14/2016  . RVF (right ventricular failure) (HCC)   . Pressure injury of skin 11/09/2016  . Acute on chronic respiratory failure with hypoxia and hypercapnia (HCC)   . Pulmonary edema 11/06/2016  . Restrictive lung disease secondary to obesity 11/06/2016  . Obesity hypoventilation syndrome (HCC) 11/06/2016  . OSA (obstructive sleep apnea) 11/06/2016  . CHF (congestive heart failure) (HCC) 11/05/2016  . HTN (hypertension) 11/02/2016  . Hypersomnia 03/02/2016  . Sarcoidosis 02/18/2016  . Hypoxemia 02/18/2016  . Morbid obesity (HCC) 02/18/2016    Current Outpatient Medications:  .  albuterol (PROVENTIL HFA;VENTOLIN HFA) 108 (90 Base) MCG/ACT inhaler, Inhale 2 puffs into the lungs every 6 (six) hours as needed for wheezing or shortness of breath., Disp: 1 Inhaler, Rfl: 5 .  atorvastatin (LIPITOR) 40 MG tablet, TAKE 1 TABLET  40 MG TOTAL  DAILY AT 6 PM BY MOUTH, Disp: , Rfl: 2 .  ciclopirox (PENLAC) 8 % solution, Apply topically at bedtime. Apply over nail and surrounding skin. Apply daily over previous coat. After seven (7) days, may remove with alcohol and continue cycle. (Patient not taking: Reported on 03/11/2017), Disp: 6.6 mL, Rfl: 0 .  diclofenac sodium (VOLTAREN) 1 % GEL, Apply 2 g topically 4 (four) times daily. Rub into affected area of foot 2  to 4 times daily (Patient not taking: Reported on 03/11/2017), Disp: 100 g, Rfl: 0 .  Foot Care Products (TRI-BALANCE ORTHOTICS MENS) MISC, Dispense 1 pair of orthotic inserts to facilitate improved balance, gait, and mobility. (Patient not taking: Reported on 03/11/2017), Disp: 2 each, Rfl: 0 .  loratadine (CLARITIN) 10 MG tablet, Take 1 tablet (10 mg total) by mouth daily., Disp: 30 tablet, Rfl: 0 .  magnesium oxide (MAG-OX) 400 (241.3 Mg) MG tablet, Take 1 tablet (400 mg total) every evening by mouth., Disp: 90 tablet, Rfl: 2 .  magnesium oxide (MAG-OX) 400 MG tablet, TAKE 1 TABLET  400 MG TOTAL  EVERY EVENING BY MOUTH, Disp: , Rfl: 2 .  metFORMIN (GLUCOPHAGE) 500 MG tablet, Take 1 tablet (500 mg total) by mouth 2 (two) times daily with a meal., Disp: 180 tablet, Rfl: 3 .  torsemide (DEMADEX) 20 MG tablet, Take 2 tablets (40 mg total) by mouth daily., Disp: 90 tablet, Rfl: 6 .  traMADol (ULTRAM) 50 MG tablet, Take 1 tablet (50 mg total) every 6 (six) hours as needed by mouth., Disp: 15 tablet, Rfl: 0 Allergies  Allergen Reactions  . Thalidomide Other (See Comments)    Derm,resp      Social History   Socioeconomic History  . Marital status: Single    Spouse name: Not on file  . Number of children: 0  . Years of education: 9612  . Highest education level: Not on file  Social Needs  . Financial resource strain: Not on  file  . Food insecurity - worry: Not on file  . Food insecurity - inability: Not on file  . Transportation needs - medical: Not on file  . Transportation needs - non-medical: Not on file  Occupational History  . Not on file  Tobacco Use  . Smoking status: Current Some Day Smoker    Packs/day: 0.10    Years: 10.00    Pack years: 1.00    Types: Cigarettes  . Smokeless tobacco: Never Used  Substance and Sexual Activity  . Alcohol use: Yes    Alcohol/week: 1.8 oz    Types: 3 Cans of beer per week  . Drug use: No  . Sexual activity: Not on file  Other Topics Concern   . Not on file  Social History Narrative   Lives alone.  Currently homeless and staying on the 3rd floor of a hotel with an elevator.     No children.  Education: high school.      Physical Exam  SAFE - 01/21/17 1500      Situation   Admitting diagnosis  CHF    Heart failure history  Exisiting    Comorbidities  DM    Readmitted within 30 days  No    Hospital admission within past 12 months  Yes    number of hospital admissions  1    number of ED visits  2      Assessment   Lives alone  Yes    Primary support person  self    Mode of transportation  personal car    Other services involved  Home Health    Home equipement  Home O2;Scale      Weight   Weighs self daily  Yes    Scale provided  No      Resources   Has "Living better w/heart failure" book  Yes    Has HF Zone tool  Yes    Able to identify yellow zone signs/when to call MD  Yes    Records zone daily  No      Medications   Uses a pill box  Yes    Who stocks the pill box  Paramedicine    Pill box checked this visit  Yes    Pill box refilled this visit  Yes    Difficulty obtaining medications  Yes    Barriers to medication  finance    Community Paramedic obtains medications from pharmacy  No    Mail order medications  No    Missed one or more doses of medications per week  No      Nutrition   Patient receives meals on wheels  No    Patient follows low sodium diet  Yes    Has foods at home that meet the current recommended diet  Yes    Patient follows low sugar/card diet  Yes    Nutritional concerns/issues  no      Activity Level   ADL's/Mobility  Independent    How many feet can patient ambulate  5      Urine   Difficulty urinating  No    Changes in urine  None      Time spent with patient   Time spent with patient   45 Minutes         Future Appointments  Date Time Provider Department Center  04/29/2017  1:15 PM Vivi Barrack, DPM [facility]  07/25/2017 11:00 AM Bing Neighbors,  FNP [facility] None    There were no vitals taken for this visit.  Weight yesterday-  Last visit weight-   Created in error.  Jacqualine Code, EMT 03/18/17  ACTION:

## 2017-03-21 ENCOUNTER — Telehealth: Payer: Self-pay

## 2017-03-21 MED FILL — !VENTOLIN HFA INHALER: 108 (90 BAS | 25 days supply | Qty: 18 | Fill #3 | Status: TO

## 2017-03-22 DIAGNOSIS — G4733 Obstructive sleep apnea (adult) (pediatric): Secondary | ICD-10-CM | POA: Diagnosis not present

## 2017-03-22 NOTE — Procedures (Signed)
   Patient Name: Hunter [name], Hunter [name] Study Date: 03/14/2017 Gender: Male D.O.B: [birth year] Age (years): 46 Referring Provider: Levy Pupaobert Byrum Height (inches): 72 Interpreting Physician: Coralyn HellingVineet Brilee Port MD, ABSM Weight (lbs): 252 RPSGT: Ulyess MortSpruill, Vicki BMI: 34 MRN: [id number] Neck Size: 15.00  CLINICAL INFORMATION 47 year old male with history of sarcoidosis and chronic hypoxic/hypercapnic respiratory failure.  He had a diagnostic study on 12/20/16 showing mild obstructive sleep apnea with an AHI of 7.7.  He returns to the sleep lab for a CPAP titration study.  The study was performed with his using his baseline 3 liters supplemental oxygen.  SLEEP STUDY TECHNIQUE As per the AASM Manual for the Scoring of Sleep and Associated Events v2.3 (April 2016) with a hypopnea requiring 4% desaturations.  The channels recorded and monitored were frontal, central and occipital EEG, electrooculogram (EOG), submentalis EMG (chin), nasal and oral airflow, thoracic and abdominal wall motion, anterior tibialis EMG, snore microphone, electrocardiogram, and pulse oximetry. Continuous positive airway pressure (CPAP) was initiated at the beginning of the study and titrated to treat sleep-disordered breathing.  MEDICATIONS Medications self-administered by patient taken the night of the study : TRAMADOL  TECHNICIAN COMMENTS Comments added by technician: PATIENT WAS ORDERED AS A CPAP TITRATION Comments added by scorer: N/A  RESPIRATORY PARAMETERS Optimal PAP Pressure (cm): 10 AHI at Optimal Pressure (/hr): 0.0 Overall Minimal O2 (%): 87.00 Supine % at Optimal Pressure (%): 0 Minimal O2 at Optimal Pressure (%): 93.0   SLEEP ARCHITECTURE The study was initiated at 11:06:17 PM and ended at 5:11:00 AM.  Sleep onset time was 18.9 minutes and the sleep efficiency was 34.5%. The total sleep time was 126.0 minutes.  The patient spent 36.11% of the night in stage N1 sleep, 37.30% in stage N2 sleep, 0.00% in stage N3  and 26.59% in REM.Stage REM latency was 155.5 minutes  Wake after sleep onset was 219.8. Alpha intrusion was absent. Supine sleep was 42.06%.  CARDIAC DATA The 2 lead EKG demonstrated sinus rhythm. The mean heart rate was 75.91 beats per minute. Other EKG findings include: PVCs.  LEG MOVEMENT DATA The total Periodic Limb Movements of Sleep (PLMS) were 50. The PLMS index was 23.81. A PLMS index of <15 is considered normal in adults.  IMPRESSIONS - He had difficulty with sleep initiation and sleep maintenance. - He had good control of his sleep disordered breathing with combination of CPAP 10 cm H2O and 3 liters supplemental oxygen. - He had an increase in his periodic limb movement index.  DIAGNOSIS - Obstructive Sleep Apnea (327.23 [G47.33 ICD-10])  RECOMMENDATIONS - Trial of CPAP therapy on 10 cm H2O and 3 liters oxygen with a Medium size Fisher&Paykel Full Face Mask Simplus mask and heated humidification.  [Electronically signed] 03/22/2017 08:31 AM  Coralyn HellingVineet Burna Atlas MD, ABSM Diplomate, American Board of Sleep Medicine NPI: [id number]

## 2017-03-23 ENCOUNTER — Other Ambulatory Visit (HOSPITAL_COMMUNITY): Payer: Self-pay

## 2017-03-23 NOTE — Progress Notes (Signed)
Paramedicine Encounter    Patient ID: Hunter GasserRobert [name], male    DOB: 05-17-1970, 47 y.o.   MRN: [id number]   Patient Care Team: Bing NeighborsHarris, Kimberly S, FNP as PCP - General (Family Medicine)  Patient Active Problem List   Diagnosis Date Noted  . 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 Chronic diastolic CHF (congestive heart failure) (HCC)   . Acute right-sided CHF (congestive heart failure) (HCC)   . Pleural effusion, right   . Acute renal failure with acute tubular necrosis superimposed on stage 3 chronic kidney disease (HCC)   . Prediabetes   . Sarcoidosis of lung (HCC)   . Generalized edema   . Orthostatic hypotension 11/14/2016  . RVF (right ventricular failure) (HCC)   . Pressure injury of skin 11/09/2016  . Acute on chronic respiratory failure with hypoxia and hypercapnia (HCC)   . Pulmonary edema 11/06/2016  . Restrictive lung disease secondary to obesity 11/06/2016  . Obesity hypoventilation syndrome (HCC) 11/06/2016  . OSA (obstructive sleep apnea) 11/06/2016  . CHF (congestive heart failure) (HCC) 11/05/2016  . HTN (hypertension) 11/02/2016  . Hypersomnia 03/02/2016  . Sarcoidosis 02/18/2016  . Hypoxemia 02/18/2016  . Morbid obesity (HCC) 02/18/2016    Current Outpatient Medications:  .  albuterol (PROVENTIL HFA;VENTOLIN HFA) 108 (90 Base) MCG/ACT inhaler, Inhale 2 puffs into the lungs every 6 (six) hours as needed for wheezing or shortness of breath., Disp: 1 Inhaler, Rfl: 5 .  ciclopirox (PENLAC) 8 % solution, Apply topically at bedtime. Apply over nail and surrounding skin. Apply daily over previous coat. After seven (7) days, may remove with alcohol and continue cycle., Disp: 6.6 mL, Rfl: 0 .  diclofenac sodium (VOLTAREN) 1 % GEL, Apply 2 g topically 4 (four) times daily. Rub into affected area of foot 2 to 4 times daily, Disp: 100 g, Rfl: 0 .  loratadine (CLARITIN) 10 MG tablet, Take 1 tablet (10 mg total) by mouth daily., Disp: 30 tablet, Rfl: 0 .   magnesium oxide (MAG-OX) 400 (241.3 Mg) MG tablet, Take 1 tablet (400 mg total) every evening by mouth., Disp: 90 tablet, Rfl: 2 .  metFORMIN (GLUCOPHAGE) 500 MG tablet, Take 1 tablet (500 mg total) by mouth 2 (two) times daily with a meal., Disp: 180 tablet, Rfl: 3 .  torsemide (DEMADEX) 20 MG tablet, Take 2 tablets (40 mg total) by mouth daily., Disp: 90 tablet, Rfl: 6 .  traMADol (ULTRAM) 50 MG tablet, Take 1 tablet (50 mg total) every 6 (six) hours as needed by mouth., Disp: 15 tablet, Rfl: 0 .  atorvastatin (LIPITOR) 40 MG tablet, TAKE 1 TABLET  40 MG TOTAL  DAILY AT 6 PM BY MOUTH, Disp: , Rfl: 2 .  Foot Care Products (TRI-BALANCE ORTHOTICS MENS) MISC, Dispense 1 pair of orthotic inserts to facilitate improved balance, gait, and mobility. (Patient not taking: Reported on 03/11/2017), Disp: 2 each, Rfl: 0 .  magnesium oxide (MAG-OX) 400 MG tablet, TAKE 1 TABLET  400 MG TOTAL  EVERY EVENING BY MOUTH, Disp: , Rfl: 2 Allergies  Allergen Reactions  . Thalidomide Other (See Comments)    Derm,resp      Social History   Socioeconomic History  . Marital status: Single    Spouse name: Not on file  . Number of children: 0  . Years of education: 1212  . Highest education level: Not on file  Social Needs  . Financial resource strain: Not on file  . Food insecurity - worry: Not on file  .  Food insecurity - inability: Not on file  . Transportation needs - medical: Not on file  . Transportation needs - non-medical: Not on file  Occupational History  . Not on file  Tobacco Use  . Smoking status: Current Some Day Smoker    Packs/day: 0.10    Years: 10.00    Pack years: 1.00    Types: Cigarettes  . Smokeless tobacco: Never Used  Substance and Sexual Activity  . Alcohol use: Yes    Alcohol/week: 1.8 oz    Types: 3 Cans of beer per week  . Drug use: No  . Sexual activity: Not on file  Other Topics Concern  . Not on file  Social History Narrative   Lives alone.  Currently homeless and  staying on the 3rd floor of a hotel with an elevator.     No children.  Education: high school.      Physical Exam  Constitutional: He is oriented to person, place, and time.  Cardiovascular: Normal rate and regular rhythm.  Pulmonary/Chest: Effort normal and breath sounds normal. No respiratory distress. He has no wheezes. He has no rales.  Abdominal: Soft.  Musculoskeletal: Normal range of motion. He exhibits no edema.  Neurological: He is alert and oriented to person, place, and time.  Skin: Skin is warm and dry.  Psychiatric: He has a normal mood and affect.        Future Appointments  Date Time Provider Department Center  04/29/2017  1:15 PM Vivi BarrackWagoner, Matthew R, DPM [facility]  07/25/2017 11:00 AM Bing NeighborsHarris, Kimberly S, FNP [facility] None    BP 120/90 (BP Location: Left Arm, Patient Position: Sitting, Cuff Size: Large)   Pulse 77   Resp 16   Wt 256 lb (116.1 kg)   SpO2 97%   BMI 34.72 kg/m   Weight yesterday- 254 lb Last visit weight- 252 lb   [name] was seen at home today and reported feeling well. He has lingering concerns about his legs getting tired but he is seeing a neurologist to address this. He stated he has an appointment to see [facility] about having a different portable oxygen delivery system. He is concerned about running out of money before he is able to pay for his storage unit and car payment however I advised that we could not help him with these payments again. He was understanding. He is still waiting to get scheduled for his muscle biopsy. He said the surgical center was waiting clearance from Dr Shirlee LatchMcLean so I will follow up with the clinic about this. His medications were verified and he is filling his own pillbox.   Time spent with patient: 34 minutes   Jacqualine CodeZackery R Faisal Stradling, EMT 03/23/17  ACTION: Home visit completed Next visit planned for 1 week

## 2017-03-28 MED FILL — ?METFORMIN HCL 500MG TABLET: 500 | 30 days supply | Qty: 60 | Fill #4

## 2017-03-28 MED FILL — TORSEMIDE 20 MG TABLET: 20 | 30 days supply | Qty: 60 | Fill #0

## 2017-03-28 MED FILL — MAGNESIUM OXIDE 400 MG TAB: 400 (240 MG | 30 days supply | Qty: 30 | Fill #3

## 2017-03-31 ENCOUNTER — Other Ambulatory Visit (HOSPITAL_COMMUNITY): Payer: Self-pay

## 2017-03-31 NOTE — Progress Notes (Signed)
Paramedicine Encounter    Patient ID: Hunter [name], male    DOB: 05-17-1970, 47 y.o.   MRN: [id number]   Patient Care Team: Hunter NeighborsHarris, Kimberly S, FNP as PCP - General (Family Medicine)  Patient Active Problem List   Diagnosis Date Noted  . 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 Chronic diastolic CHF (congestive heart failure) (HCC)   . Acute right-sided CHF (congestive heart failure) (HCC)   . Pleural effusion, right   . Acute renal failure with acute tubular necrosis superimposed on stage 3 chronic kidney disease (HCC)   . Prediabetes   . Sarcoidosis of lung (HCC)   . Generalized edema   . Orthostatic hypotension 11/14/2016  . RVF (right ventricular failure) (HCC)   . Pressure injury of skin 11/09/2016  . Acute on chronic respiratory failure with hypoxia and hypercapnia (HCC)   . Pulmonary edema 11/06/2016  . Restrictive lung disease secondary to obesity 11/06/2016  . Obesity hypoventilation syndrome (HCC) 11/06/2016  . OSA (obstructive sleep apnea) 11/06/2016  . CHF (congestive heart failure) (HCC) 11/05/2016  . HTN (hypertension) 11/02/2016  . Hypersomnia 03/02/2016  . Sarcoidosis 02/18/2016  . Hypoxemia 02/18/2016  . Morbid obesity (HCC) 02/18/2016    Current Outpatient Medications:  .  albuterol (PROVENTIL HFA;VENTOLIN HFA) 108 (90 Base) MCG/ACT inhaler, Inhale 2 puffs into the lungs every 6 (six) hours as needed for wheezing or shortness of breath., Disp: 1 Inhaler, Rfl: 5 .  ciclopirox (PENLAC) 8 % solution, Apply topically at bedtime. Apply over nail and surrounding skin. Apply daily over previous coat. After seven (7) days, may remove with alcohol and continue cycle., Disp: 6.6 mL, Rfl: 0 .  diclofenac sodium (VOLTAREN) 1 % GEL, Apply 2 g topically 4 (four) times daily. Rub into affected area of foot 2 to 4 times daily, Disp: 100 g, Rfl: 0 .  loratadine (CLARITIN) 10 MG tablet, Take 1 tablet (10 mg total) by mouth daily., Disp: 30 tablet, Rfl: 0 .   magnesium oxide (MAG-OX) 400 (241.3 Mg) MG tablet, Take 1 tablet (400 mg total) every evening by mouth., Disp: 90 tablet, Rfl: 2 .  metFORMIN (GLUCOPHAGE) 500 MG tablet, Take 1 tablet (500 mg total) by mouth 2 (two) times daily with a meal., Disp: 180 tablet, Rfl: 3 .  torsemide (DEMADEX) 20 MG tablet, Take 2 tablets (40 mg total) by mouth daily., Disp: 90 tablet, Rfl: 6 .  traMADol (ULTRAM) 50 MG tablet, Take 1 tablet (50 mg total) every 6 (six) hours as needed by mouth., Disp: 15 tablet, Rfl: 0 .  atorvastatin (LIPITOR) 40 MG tablet, TAKE 1 TABLET  40 MG TOTAL  DAILY AT 6 PM BY MOUTH, Disp: , Rfl: 2 .  Foot Care Products (TRI-BALANCE ORTHOTICS MENS) MISC, Dispense 1 pair of orthotic inserts to facilitate improved balance, gait, and mobility. (Patient not taking: Reported on 03/11/2017), Disp: 2 each, Rfl: 0 .  magnesium oxide (MAG-OX) 400 MG tablet, TAKE 1 TABLET  400 MG TOTAL  EVERY EVENING BY MOUTH, Disp: , Rfl: 2 Allergies  Allergen Reactions  . Thalidomide Other (See Comments)    Derm,resp      Social History   Socioeconomic History  . Marital status: Single    Spouse name: Not on file  . Number of children: 0  . Years of education: 1212  . Highest education level: Not on file  Social Needs  . Financial resource strain: Not on file  . Food insecurity - worry: Not on file  .  Food insecurity - inability: Not on file  . Transportation needs - medical: Not on file  . Transportation needs - non-medical: Not on file  Occupational History  . Not on file  Tobacco Use  . Smoking status: Current Some Day Smoker    Packs/day: 0.10    Years: 10.00    Pack years: 1.00    Types: Cigarettes  . Smokeless tobacco: Never Used  Substance and Sexual Activity  . Alcohol use: Yes    Alcohol/week: 1.8 oz    Types: 3 Cans of beer per week  . Drug use: No  . Sexual activity: Not on file  Other Topics Concern  . Not on file  Social History Narrative   Lives alone.  Currently homeless and  staying on the 3rd floor of a hotel with an elevator.     No children.  Education: high school.      Physical Exam  Constitutional: He is oriented to person, place, and time.  Cardiovascular: Normal rate and regular rhythm.  Pulmonary/Chest: Effort normal and breath sounds normal.  Musculoskeletal: Normal range of motion. He exhibits no edema.  Neurological: He is alert and oriented to person, place, and time.  Skin: Skin is warm and dry.  Psychiatric: He has a normal mood and affect.        Future Appointments  Date Time Provider Department Center  04/29/2017  1:15 PM Vivi Barrack, DPM [facility]  07/25/2017 11:00 AM Hunter Neighbors, FNP [facility] None    BP 136/84 (BP Location: Left Arm, Patient Position: Sitting, Cuff Size: Large)   Pulse 87   Resp 18   Wt 255 lb (115.7 kg)   SpO2 97%   BMI 34.58 kg/m   Weight yesterday- 258 lb Last visit weight- 256 lb  [name] was seen at home today and reported feeling generally well. He continues to have leg fatigue but reports sleeping well in the evenings. He is concerned about the ease at which he becomes fatigued with ADL'[name]. He reported being compliant with medications which were verified but he is filling his own pillbox. I have attempted to contact the storage unit manager three times and left two voicemails but have not gotten my calls returned. I have relayed this information to [name] and he was understanding that I have made no advances on that front. He had several questions about his disease processes today. I answered his questions to the best of my ability and then advised he consult the appropriate specialists, which he is already seeing.   Time spent with patient: 46 minutes  Jacqualine Code, EMT 03/31/17  ACTION: Home visit completed Next visit planned for 2 weeks

## 2017-04-07 ENCOUNTER — Telehealth (HOSPITAL_COMMUNITY): Payer: Self-pay

## 2017-04-07 ENCOUNTER — Other Ambulatory Visit: Payer: Self-pay | Admitting: Podiatry

## 2017-04-07 NOTE — Telephone Encounter (Signed)
Mr Hunter [name] returned my phone call and said he was not able to meet today but could see me tomorrow. We agreed upon 11:00 tomorrow (Friday, March 1) for his weekly visit.

## 2017-04-07 NOTE — Telephone Encounter (Signed)
I called Mr Hunter [name] to schedule an appointment. He did not answer so I left a voicemail requesting he call me back.

## 2017-04-08 ENCOUNTER — Other Ambulatory Visit (HOSPITAL_COMMUNITY): Payer: Self-pay

## 2017-04-08 MED FILL — CICLOPIROX 8% SOLUTION: 8 | 30 days supply | Qty: 7 | Fill #0

## 2017-04-08 NOTE — Progress Notes (Signed)
Paramedicine Encounter    Patient ID: Hunter [name], male    DOB: 03/28/1970, 47 y.o.   MRN: [id number]   Patient Care Team: Bing NeighborsHarris, Kimberly S, FNP as PCP - General (Family Medicine)  Patient Active Problem List   Diagnosis Date Noted  . 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 Chronic diastolic CHF (congestive heart failure) (HCC)   . Acute right-sided CHF (congestive heart failure) (HCC)   . Pleural effusion, right   . Acute renal failure with acute tubular necrosis superimposed on stage 3 chronic kidney disease (HCC)   . Prediabetes   . Sarcoidosis of lung (HCC)   . Generalized edema   . Orthostatic hypotension 11/14/2016  . RVF (right ventricular failure) (HCC)   . Pressure injury of skin 11/09/2016  . Acute on chronic respiratory failure with hypoxia and hypercapnia (HCC)   . Pulmonary edema 11/06/2016  . Restrictive lung disease secondary to obesity 11/06/2016  . Obesity hypoventilation syndrome (HCC) 11/06/2016  . OSA (obstructive sleep apnea) 11/06/2016  . CHF (congestive heart failure) (HCC) 11/05/2016  . HTN (hypertension) 11/02/2016  . Hypersomnia 03/02/2016  . Sarcoidosis 02/18/2016  . Hypoxemia 02/18/2016  . Morbid obesity (HCC) 02/18/2016    Current Outpatient Medications:  .  albuterol (PROVENTIL HFA;VENTOLIN HFA) 108 (90 Base) MCG/ACT inhaler, Inhale 2 puffs into the lungs every 6 (six) hours as needed for wheezing or shortness of breath., Disp: 1 Inhaler, Rfl: 5 .  ciclopirox (PENLAC) 8 % solution, APPLY TOPICALLY AT BEDTIME OVER NAIL AND SURROUNDING SKIN. APPLY DAILY OVER PREVIOUS COAT. AFTER 7 DAYS, MAY REMOVE WITH ALCOHOL AND CONTINU, Disp: 6.6 mL, Rfl: 0 .  diclofenac sodium (VOLTAREN) 1 % GEL, APPLY 2 GRAMS TO FOOT (AND AFFECTED AREAS) 2-4 TIMES DAILY., Disp: 100 g, Rfl: 0 .  loratadine (CLARITIN) 10 MG tablet, Take 1 tablet (10 mg total) by mouth daily., Disp: 30 tablet, Rfl: 0 .  magnesium oxide (MAG-OX) 400 (241.3 Mg) MG tablet,  Take 1 tablet (400 mg total) every evening by mouth., Disp: 90 tablet, Rfl: 2 .  magnesium oxide (MAG-OX) 400 MG tablet, TAKE 1 TABLET  400 MG TOTAL  EVERY EVENING BY MOUTH, Disp: , Rfl: 2 .  metFORMIN (GLUCOPHAGE) 500 MG tablet, Take 1 tablet (500 mg total) by mouth 2 (two) times daily with a meal., Disp: 180 tablet, Rfl: 3 .  torsemide (DEMADEX) 20 MG tablet, Take 2 tablets (40 mg total) by mouth daily., Disp: 90 tablet, Rfl: 6 .  traMADol (ULTRAM) 50 MG tablet, Take 1 tablet (50 mg total) every 6 (six) hours as needed by mouth., Disp: 15 tablet, Rfl: 0 .  atorvastatin (LIPITOR) 40 MG tablet, TAKE 1 TABLET  40 MG TOTAL  DAILY AT 6 PM BY MOUTH, Disp: , Rfl: 2 .  Foot Care Products (TRI-BALANCE ORTHOTICS MENS) MISC, Dispense 1 pair of orthotic inserts to facilitate improved balance, gait, and mobility. (Patient not taking: Reported on 03/11/2017), Disp: 2 each, Rfl: 0 Allergies  Allergen Reactions  . Thalidomide Other (See Comments)    Derm,resp      Social History   Socioeconomic History  . Marital status: Single    Spouse name: Not on file  . Number of children: 0  . Years of education: 8312  . Highest education level: Not on file  Social Needs  . Financial resource strain: Not on file  . Food insecurity - worry: Not on file  . Food insecurity - inability: Not on file  . Transportation needs -  medical: Not on file  . Transportation needs - non-medical: Not on file  Occupational History  . Not on file  Tobacco Use  . Smoking status: Current Some Day Smoker    Packs/day: 0.10    Years: 10.00    Pack years: 1.00    Types: Cigarettes  . Smokeless tobacco: Never Used  Substance and Sexual Activity  . Alcohol use: Yes    Alcohol/week: 1.8 oz    Types: 3 Cans of beer per week  . Drug use: No  . Sexual activity: Not on file  Other Topics Concern  . Not on file  Social History Narrative   Lives alone.  Currently homeless and staying on the 3rd floor of a hotel with an elevator.      No children.  Education: high school.      Physical Exam  Constitutional: He is oriented to person, place, and time.  Cardiovascular: Normal rate and regular rhythm.  Pulmonary/Chest: Effort normal and breath sounds normal. No respiratory distress. He has no wheezes. He has no rales.  Abdominal: Soft.  Musculoskeletal: Normal range of motion. He exhibits no edema.  Neurological: He is alert and oriented to person, place, and time.  Skin: Skin is warm and dry.  Psychiatric: He has a normal mood and affect.        Future Appointments  Date Time Provider Department Center  04/11/2017 10:45 AM Bing Neighbors, FNP [facility] None  04/29/2017  1:15 PM Vivi Barrack, DPM [facility]  07/25/2017 11:00 AM Bing Neighbors, FNP [facility] None    BP 136/88 (BP Location: Right Arm, Patient Position: Sitting, Cuff Size: Large)   Pulse 80   Resp 16   Wt 258 lb (117 kg)   SpO2 96%   BMI 34.99 kg/m   Weight yesterday- 257 lb Last visit weight- 255 lb   Hunter [name] was seen at home and reported feeling well. He is still wanting to get compression stockings from the clinic but I have told hi that they do not have the stockings to give. He said he spoke to someone who advised Medicaid would pay for the stockings if he had "an amendment on his prescription." I explained that I do not know anything about that but he should talk to his PCP since he sees her next week. He stated he has been having intermittent vision problems but denied dizziness, orthopnea or SOB. He reports being happy with his new oxygen delivery system but still wants a portable concentrator. He said that he has a meeting lined up with a different supply company to see if he can get a portable concentrator from them while keeping his other oxygen supply services at [facility]. I told him I do not know the possibility of having both the portable concentrator and his current system at the same time and he should  discuss that at his meeting. He seems to be doing will with his medications. We have discussed increasing time between visits to 2 weeks and he was agreeable.   Time spent with patient: 38 minutes   Jacqualine Code, EMT 04/08/17  ACTION: Home visit completed Next visit planned for 2 weeks

## 2017-04-11 ENCOUNTER — Encounter: Payer: Self-pay | Admitting: *Deleted

## 2017-04-11 ENCOUNTER — Telehealth: Payer: Self-pay | Admitting: Emergency Medicine

## 2017-04-11 ENCOUNTER — Encounter: Payer: Self-pay | Admitting: Family Medicine

## 2017-04-11 ENCOUNTER — Ambulatory Visit (INDEPENDENT_AMBULATORY_CARE_PROVIDER_SITE_OTHER): Payer: Medicaid Other | Admitting: Family Medicine

## 2017-04-11 VITALS — BP 138/84 | HR 80 | Temp 98.2°F | Ht 72.0 in | Wt 260.0 lb

## 2017-04-11 DIAGNOSIS — M79605 Pain in left leg: Secondary | ICD-10-CM | POA: Diagnosis not present

## 2017-04-11 DIAGNOSIS — I8312 Varicose veins of left lower extremity with inflammation: Secondary | ICD-10-CM

## 2017-04-11 DIAGNOSIS — M79604 Pain in right leg: Secondary | ICD-10-CM | POA: Diagnosis not present

## 2017-04-11 DIAGNOSIS — I8311 Varicose veins of right lower extremity with inflammation: Secondary | ICD-10-CM

## 2017-04-11 DIAGNOSIS — D869 Sarcoidosis, unspecified: Secondary | ICD-10-CM | POA: Diagnosis not present

## 2017-04-11 MED FILL — traMADol HCL 50 MG TABS: 50 | 7 days supply | Qty: 14 | Fill #1

## 2017-04-11 NOTE — Telephone Encounter (Signed)
Spoke with pt. States that he has been doing well since his last OV. Surgical clearance [facility]. Nothing further was needed.

## 2017-04-11 NOTE — Telephone Encounter (Signed)
If he remains clinically stable (no problems since our visit) then we can clear him now

## 2017-04-11 NOTE — Telephone Encounter (Signed)
[facility]. Pt is needing to have right thigh muscle biopsy. He will need surgical clearance before this can be done. We last saw him on 02/14/17.  Dr. Delton CoombesByrum - can we do surgical clearance based on this OV or will he need to be seen? Thanks.

## 2017-04-11 NOTE — Patient Instructions (Signed)
Varicose Veins Varicose veins are veins that have become enlarged and twisted. They are usually seen in the legs but can occur in other parts of the body as well. What are the causes? This condition is the result of valves in the veins not working properly. Valves in the veins help to return blood from the leg to the heart. If these valves are damaged, blood flows backward and backs up into the veins in the leg near the skin. This causes the veins to become larger. What increases the risk? People who are on their feet a lot, who are pregnant, or who are overweight are more likely to develop varicose veins. What are the signs or symptoms?  Bulging, twisted-appearing, bluish veins, most commonly found on the legs.  Leg pain or a feeling of heaviness. These symptoms may be worse at the end of the day.  Leg swelling.  Changes in skin color. How is this diagnosed? A health care provider can usually diagnose varicose veins by examining your legs. Your health care provider may also recommend an ultrasound of your leg veins. How is this treated? Most varicose veins can be treated at home.However, other treatments are available for people who have persistent symptoms or want to improve the cosmetic appearance of the varicose veins. These treatment options include:  Sclerotherapy. A solution is injected into the vein to close it off.  Laser treatment. A laser is used to heat the vein to close it off.  Radiofrequency vein ablation. An electrical current produced by radio waves is used to close off the vein.  Phlebectomy. The vein is surgically removed through small incisions made over the varicose vein.  Vein ligation and stripping. The vein is surgically removed through incisions made over the varicose vein after the vein has been tied (ligated). Follow these instructions at home:   Do not stand or sit in one position for long periods of time. Do not sit with your legs crossed. Rest with your  legs raised during the day.  Wear compression stockings as directed by your health care provider. These stockings help to prevent blood clots and reduce swelling in your legs.  Do not wear other tight, encircling garments around your legs, pelvis, or waist.  Walk as much as possible to increase blood flow.  Raise the foot of your bed at night with 2-inch blocks.  If you get a cut in the skin over the vein and the vein bleeds, lie down with your leg raised and press on it with a clean cloth until the bleeding stops. Then place a bandage (dressing) on the cut. See your health care provider if it continues to bleed. Contact a health care provider if:  The skin around your ankle starts to break down.  You have pain, redness, tenderness, or hard swelling in your leg over a vein.  You are uncomfortable because of leg pain. This information is not intended to replace advice given to you by your health care provider. Make sure you discuss any questions you have with your health care provider. Document Released: 11/04/2004 Document Revised: 07/03/2015 Document Reviewed: 07/29/2015 Elsevier Interactive Patient Education  2017 Elsevier Inc.  

## 2017-04-11 NOTE — Progress Notes (Signed)
Patient ID: Hunter [name], male    DOB: 47 y.o. Jul 09, 1970, 47 y.o.   MRN: [id number]  PCP: Bing Neighbors, FNP  Chief Complaint  Patient presents with  . need for compression socks    Subjective:  HPI Hunter [name] is a 47 y.o. male presents for evaluation of bilateral leg pain and request an order for compression stockings. Medical problems significant for sarcoidosis, pulmonary edema, OSA, hypertension, prediabetes, CHF, and CKD 3. Today he presents requesting an order for compression stockings as this was recommended by his cardiologist to facilitate improved lower extremity circulation. He reports bilateral lower extremity weakness and chronic leg pain for which he is scheduled to undergo a muscle biopsy. He also suffers form chronic varicose veins and has struggled with BLE edema which has been controlled of recent with diuretic therapy. He is at risk for DVT has he is mostly sedentary and is chronic daily smoker. Social History   Socioeconomic History  . Marital status: Single    Spouse name: Not on file  . Number of children: 0  . Years of education: 56  . Highest education level: Not on file  Social Needs  . Financial resource strain: Not on file  . Food insecurity - worry: Not on file  . Food insecurity - inability: Not on file  . Transportation needs - medical: Not on file  . Transportation needs - non-medical: Not on file  Occupational History  . Not on file  Tobacco Use  . Smoking status: Current Some Day Smoker    Packs/day: 0.10    Years: 10.00    Pack years: 1.00    Types: Cigarettes  . Smokeless tobacco: Never Used  Substance and Sexual Activity  . Alcohol use: Yes    Alcohol/week: 1.8 oz    Types: 3 Cans of beer per week  . Drug use: No  . Sexual activity: Not on file  Other Topics Concern  . Not on file  Social History Narrative   Lives alone.  Currently homeless and staying on the 3rd floor of a hotel with an elevator.     No children.  Education: high  school.      Family History  Problem Relation Age of Onset  . Asthma Sister   . Hypertension Paternal Grandmother    Review of Systems Pertinent negatives included in HPI Patient Active Problem List   Diagnosis Date Noted  . 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 Chronic diastolic CHF (congestive heart failure) (HCC)   . Acute right-sided CHF (congestive heart failure) (HCC)   . Pleural effusion, right   . Acute renal failure with acute tubular necrosis superimposed on stage 3 chronic kidney disease (HCC)   . Prediabetes   . Sarcoidosis of lung (HCC)   . Generalized edema   . Orthostatic hypotension 11/14/2016  . RVF (right ventricular failure) (HCC)   . Pressure injury of skin 11/09/2016  . Acute on chronic respiratory failure with hypoxia and hypercapnia (HCC)   . Pulmonary edema 11/06/2016  . Restrictive lung disease secondary to obesity 11/06/2016  . Obesity hypoventilation syndrome (HCC) 11/06/2016  . OSA (obstructive sleep apnea) 11/06/2016  . CHF (congestive heart failure) (HCC) 11/05/2016  . HTN (hypertension) 11/02/2016  . Hypersomnia 03/02/2016  . Sarcoidosis 02/18/2016  . Hypoxemia 02/18/2016  . Morbid obesity (HCC) 02/18/2016    Allergies  Allergen Reactions  . Thalidomide Other (See Comments)    Derm,resp    Prior to Admission medications   Medication Sig  Start Date End Date Taking? Authorizing Provider  magnesium oxide (MAG-OX) 400 (241.3 Mg) MG tablet Take 1 tablet (400 mg total) every evening by mouth. 12/17/16  Yes Bing NeighborsHarris, Leroy Trim S, FNP  metFORMIN (GLUCOPHAGE) 500 MG tablet Take 1 tablet (500 mg total) by mouth 2 (two) times daily with a meal. 11/23/16  Yes Bing NeighborsHarris, Raynell Upton S, FNP  torsemide (DEMADEX) 20 MG tablet Take 2 tablets (40 mg total) by mouth daily. 11/26/16  Yes Little IshikawaSmith, Erin E, NP  albuterol (PROVENTIL HFA;VENTOLIN HFA) 108 (90 Base) MCG/ACT inhaler Inhale 2 puffs into the lungs every 6 (six) hours as needed for wheezing  or shortness of breath. 12/02/16   Leslye PeerByrum, Vaughan S, MD  atorvastatin (LIPITOR) 40 MG tablet TAKE 1 TABLET  40 MG TOTAL  DAILY AT 6 PM BY MOUTH 01/19/17   [provider]  ciclopirox (PENLAC) 8 % solution APPLY TOPICALLY AT BEDTIME OVER NAIL AND SURROUNDING SKIN. APPLY DAILY OVER PREVIOUS COAT. AFTER 7 DAYS, MAY REMOVE WITH ALCOHOL AND CONTINU 04/07/17   Vivi BarrackWagoner, Matthew R, DPM  diclofenac sodium (VOLTAREN) 1 % GEL APPLY 2 GRAMS TO FOOT (AND AFFECTED AREAS) 2-4 TIMES DAILY. 04/07/17   Vivi BarrackWagoner, Matthew R, DPM  Foot Care Products (TRI-BALANCE ORTHOTICS MENS) MISC Dispense 1 pair of orthotic inserts to facilitate improved balance, gait, and mobility. Patient not taking: Reported on 03/11/2017 03/09/17   Bing NeighborsHarris, Saifan Rayford S, FNP  loratadine (CLARITIN) 10 MG tablet Take 1 tablet (10 mg total) by mouth daily. 11/18/16   Drema DallasWoods, Curtis J, MD  magnesium oxide (MAG-OX) 400 MG tablet TAKE 1 TABLET  400 MG TOTAL  EVERY EVENING BY MOUTH 02/21/17   [provider]  traMADol (ULTRAM) 50 MG tablet Take 1 tablet (50 mg total) every 6 (six) hours as needed by mouth. 12/21/16   Tegeler, Canary Brimhristopher J, MD    Past Medical, Surgical Family and Social History reviewed and updated.    Objective:   Today's Vitals   04/11/17 1048 04/11/17 1125  BP: (!) 146/82 138/84  Pulse: 80   Temp: 98.2 F (36.8 C)   TempSrc: Oral   SpO2: 98%   Weight: 260 lb (117.9 kg)   Height: 6' (1.829 m)     Wt Readings from Last 3 Encounters:  04/11/17 260 lb (117.9 kg)  04/08/17 258 lb (117 kg)  03/31/17 255 lb (115.7 kg)    Physical Exam  Constitutional: He is oriented to person, place, and time. He appears well-developed and well-nourished.  HENT:  Head: Normocephalic and atraumatic.  Neck: Normal range of motion.  Cardiovascular: Normal rate, regular rhythm, normal heart sounds and intact distal pulses.  Pulmonary/Chest: Effort normal and breath sounds normal.  Current continuous oxygen therapy 2 L   Lymphadenopathy:    He has no cervical adenopathy.  Neurological: He is alert and oriented to person, place, and time.  Skin: Skin is warm and dry.  Psychiatric: He has a normal mood and affect. His behavior is normal. Judgment and thought content normal.   Assessment & Plan:  1. Varicose veins of both lower extremities with inflammation 2. Bilateral leg pain 3. Sarcoidosis Order written for compression stockings and provided to patient to have filled. Advise patient that I am unaware of which type or brand of compression stockings that are covered by Medicaid. Continue to adhere to low sodium diet.    RTC: Keep scheduled follow-up on file.    Godfrey PickKimberly S. Tiburcio PeaHarris, MSN, FNP-C [facility]  [address], [place], [place] [address] [phone]

## 2017-04-20 ENCOUNTER — Telehealth: Payer: Self-pay | Admitting: Emergency Medicine

## 2017-04-20 NOTE — Telephone Encounter (Signed)
Spoke with pt, he needs an order for a POC. He scheduled an office visit with SG to do face to face. Called Marchelle Folksmanda to let her know and nothing further is needed.

## 2017-04-21 ENCOUNTER — Telehealth: Payer: Self-pay

## 2017-04-21 ENCOUNTER — Telehealth: Payer: Self-pay | Admitting: Emergency Medicine

## 2017-04-21 MED ORDER — ALBUTEROL SULFATE HFA 108 (90 BASE) MCG/ACT IN AERS: 2.0000 | INHALATION_SPRAY | Freq: Four times a day (QID) | RESPIRATORY_TRACT | 5 refills | Status: DC | PRN

## 2017-04-21 MED FILL — PROAIR HFA 90 MCG INHALER: 108 (90 BAS | 25 days supply | Qty: 9 | Fill #0

## 2017-04-21 NOTE — Telephone Encounter (Signed)
Spoke with pt. He needs a refill on Albuterol HFA. Rx has been sent in. Nothing further was needed at this time.

## 2017-04-22 MED FILL — metFORMIN HCL 500 MG TABS: 500 | 30 days supply | Qty: 60 | Fill #5

## 2017-04-25 NOTE — Pre-Procedure Instructions (Signed)
Hunter [name]  04/25/2017      [facility] [place], [place] - [place] [address] [place] [address] Phone: [phone] Fax: [phone]    Your procedure is scheduled on March 26  Report to [facility] Admitting at [employer] A.M.  Call this number if you have problems the morning of surgery:  [phone]   Remember:  Do not eat food or drink liquids after midnight.  Take these medicines the morning of surgery with A SIP OF WATER Albuterol inhaler if needed, Tramadol (Ultram) if needed  Stop taking aspirin, BC's, goody's, Herbal medications, Fish Oil, Ibuprofen, Advil, Motrin, Aleve     How to Manage Your Diabetes Before and After Surgery  Why is it important to control my blood sugar before and after surgery? . Improving blood sugar levels before and after surgery helps healing and can limit problems. . A way of improving blood sugar control is eating a healthy diet by: o  Eating less sugar and carbohydrates o  Increasing activity/exercise o  Talking with your doctor about reaching your blood sugar goals . High blood sugars (greater than 180 mg/dL) can raise your risk of infections and slow your recovery, so you will need to focus on controlling your diabetes during the weeks before surgery. . Make sure that the doctor who takes care of your diabetes knows about your planned surgery including the date and location.  How do I manage my blood sugar before surgery? . Check your blood sugar at least 4 times a day, starting 2 days before surgery, to make sure that the level is not too high or low. o Check your blood sugar the morning of your surgery when you wake up and every 2 hours until you get to the Short Stay unit. . If your blood sugar is less than 70 mg/dL, you will need to treat for low blood sugar: o Do not take insulin. o Treat a low blood sugar (less than 70 mg/dL) with  cup of clear juice (cranberry or apple), 4  glucose tablets, OR glucose gel. Recheck blood sugar in 15 minutes after treatment (to make sure it is greater than 70 mg/dL). If your blood sugar is not greater than 70 mg/dL on recheck, call [phone] o  for further instructions. . Report your blood sugar to the short stay nurse when you get to Short Stay.  . If you are admitted to the hospital after surgery: o Your blood sugar will be checked by the staff and you will probably be given insulin after surgery (instead of oral diabetes medicines) to make sure you have good blood sugar levels. o The goal for blood sugar control after surgery is 80-180 mg/dL.              WHAT DO I DO ABOUT MY DIABETES MEDICATION?   Marland Kitchen Do not take oral diabetes medicines (pills) the morning of surgery. Metformin (Glucophage)    . The day of surgery, do not take other diabetes injectables, including Byetta (exenatide), Bydureon (exenatide ER), Victoza (liraglutide), or Trulicity (dulaglutide).  . If your CBG is greater than 220 mg/dL, you may take  of your sliding scale (correction) dose of insulin.  Other Instructions:          Patient Signature:  Date:   Nurse Signature:  Date:   Reviewed and Endorsed by [facility] Patient Education Committee, August 2015  Do not wear jewelry, make-up or nail  polish.  Do not wear lotions, powders, or perfumes, or deodorant.  Do not shave 48 hours prior to surgery.  Men may shave face and neck.  Do not bring valuables to the hospital.  [facility] is [facility] responsible [facility] any belongings or valuables.  Contacts, dentures or bridgework may not be worn into surgery.  Leave your suitcase in the car.  After surgery it may be brought to your room.  For patients admitted to the hospital, discharge time will be determined by your treatment team.  Patients discharged the day of surgery will not be allowed to drive home.   Special instructions:  [place]- Preparing For Surgery  Before surgery, you  can play an important role. Because skin is not sterile, your skin needs to be as free of germs as possible. You can reduce the number of germs on your skin by washing with CHG (chlorahexidine gluconate) Soap before surgery.  CHG is an antiseptic cleaner which kills germs and bonds with the skin to continue killing germs even after washing.  Please do not use if you have an allergy to CHG or antibacterial soaps. If your skin becomes reddened/irritated stop using the CHG.  Do not shave (including legs and underarms) for at least 48 hours prior to first CHG shower. It is OK to shave your face.  Please follow these instructions carefully.   1. Shower the NIGHT BEFORE SURGERY and the MORNING OF SURGERY with CHG.   2. If you chose to wash your hair, wash your hair first as usual with your normal shampoo.  3. After you shampoo, rinse your hair and body thoroughly to remove the shampoo.  4. Use CHG as you would any other liquid soap. You can apply CHG directly to the skin and wash gently with a scrungie or a clean washcloth.   5. Apply the CHG Soap to your body ONLY FROM THE NECK DOWN.  Do not use on open wounds or open sores. Avoid contact with your eyes, ears, mouth and genitals (private parts). Wash Face and genitals (private parts)  with your normal soap.  6. Wash thoroughly, paying special attention to the area where your surgery will be performed.  7. Thoroughly rinse your body with warm water from the neck down.  8. DO NOT shower/wash with your normal soap after using and rinsing off the CHG Soap.  9. Pat yourself dry with a CLEAN TOWEL.  10. Wear CLEAN PAJAMAS to bed the night before surgery, wear comfortable clothes the morning of surgery  11. Place CLEAN SHEETS on your bed the night of your first shower and DO NOT SLEEP WITH PETS.    Day of Surgery: Do not apply any deodorants/lotions. Please wear clean clothes to the hospital/surgery center.       Please read over the  following fact sheets that you were given. Pain Booklet, Coughing and Deep Breathing and Surgical Site Infection Prevention

## 2017-04-26 ENCOUNTER — Encounter (HOSPITAL_COMMUNITY): Payer: Self-pay

## 2017-04-26 ENCOUNTER — Encounter (HOSPITAL_COMMUNITY)
Admission: RE | Admit: 2017-04-26 | Discharge: 2017-04-26 | Disposition: A | Discharge status: Home / Self Care | Payer: Medicaid Other | Source: Ambulatory Visit | Attending: Surgery | Admitting: Surgery

## 2017-04-26 ENCOUNTER — Other Ambulatory Visit: Payer: Self-pay

## 2017-04-26 DIAGNOSIS — J9612 Chronic respiratory failure with hypercapnia: Secondary | ICD-10-CM | POA: Insufficient documentation

## 2017-04-26 DIAGNOSIS — I119 Hypertensive heart disease without heart failure: Secondary | ICD-10-CM | POA: Insufficient documentation

## 2017-04-26 DIAGNOSIS — Z87891 Personal history of nicotine dependence: Secondary | ICD-10-CM | POA: Diagnosis not present

## 2017-04-26 DIAGNOSIS — R7303 Prediabetes: Secondary | ICD-10-CM | POA: Insufficient documentation

## 2017-04-26 DIAGNOSIS — Z01812 Encounter for preprocedural laboratory examination: Secondary | ICD-10-CM | POA: Insufficient documentation

## 2017-04-26 DIAGNOSIS — G4733 Obstructive sleep apnea (adult) (pediatric): Secondary | ICD-10-CM | POA: Diagnosis not present

## 2017-04-26 DIAGNOSIS — M1990 Unspecified osteoarthritis, unspecified site: Secondary | ICD-10-CM | POA: Insufficient documentation

## 2017-04-26 DIAGNOSIS — D869 Sarcoidosis, unspecified: Secondary | ICD-10-CM | POA: Insufficient documentation

## 2017-04-26 DIAGNOSIS — J9611 Chronic respiratory failure with hypoxia: Secondary | ICD-10-CM | POA: Insufficient documentation

## 2017-04-26 HISTORY — DX: Unspecified osteoarthritis, unspecified site: M19.90

## 2017-04-26 HISTORY — DX: Prediabetes: R73.03

## 2017-04-26 HISTORY — DX: Dyspnea, unspecified: R06.00

## 2017-04-26 HISTORY — DX: Sleep apnea, unspecified: G47.30

## 2017-04-26 LAB — BASIC METABOLIC PANEL
ANION GAP: 12 (ref 5–15)
BUN: 41 mg/dL — ABNORMAL HIGH (ref 6–20)
CALCIUM: 9.1 mg/dL (ref 8.9–10.3)
CO2: 26 mmol/L (ref 22–32)
Chloride: 101 mmol/L (ref 101–111)
Creatinine, Ser: 1.01 mg/dL (ref 0.61–1.24)
GFR calc Af Amer: >60 mL/min (ref >60)
GFR calc non Af Amer: >60 mL/min (ref >60)
GLUCOSE: 95 mg/dL (ref 65–99)
POTASSIUM: 4.7 mmol/L (ref 3.5–5.1)
Sodium: 139 mmol/L (ref 135–145)

## 2017-04-26 LAB — CBC
HCT: 45.1 % (ref 39.0–52.0)
HEMOGLOBIN: 14.5 g/dL (ref 13.0–17.0)
MCH: 29.5 pg (ref 26.0–34.0)
MCHC: 32.2 g/dL (ref 30.0–36.0)
MCV: 91.7 fL (ref 78.0–100.0)
Platelets: 153 10*3/uL (ref 150–400)
RBC: 4.92 MIL/uL (ref 4.22–5.81)
RDW: 14 % (ref 11.5–15.5)
WBC: 3.7 10*3/uL — ABNORMAL LOW (ref 4.0–10.5)

## 2017-04-26 LAB — HEMOGLOBIN A1C
HEMOGLOBIN A1C: 5.6 % (ref 4.8–5.6)
Mean Plasma Glucose: 114.02 mg/dL

## 2017-04-26 NOTE — Progress Notes (Signed)
PCP is Joaquin CourtsKimberly Harris, NP Cardiologist is Dr Shirlee LatchMcLean Pulmonary is Dr Gracelyn NurseByrum-clearance see note 04-11-17 Denies  Fever,cough, or chest pain, but c/o swelling in legs with increased sodium intake. Instructed to bring Cpap mask on the dos Wear 3 liters of o2 at all times

## 2017-04-26 NOTE — Progress Notes (Addendum)
Anesthesia PAT Evaluation: [name] scheduled for right thigh muscle biopsy/right deltoid muscle biopsy on 05/03/17 by Dr. Donnie Mesa. Patient reports BLE weakness for at least the past year, making it difficult to walk and stay balanced. NCV with EMG showed weakness may be stemming from a muscle condition, and neurology recommended a muscle biopsy if Myositis panel negative. Case is posted for general with LMA, but will communicate with Dr. Georgette Dover regarding if he feels procedure can be done under MAC given patient's respiratory status. Certain muscle conditions can also increase risk for malignant hyperthermia type reaction, although patient denied any personal or family history of anesthesia complications.   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  BMI is consistent with obesity.  - Admission 11/05/16-11/17/16 (sent from PCP office for SOB, elevated BNP) for acute right heart failure/chronic diastolic CHF. History of non-compliance with home O2 (known pulmonary Sarcoidosis) and had never had ordered sleep study. Pulmonology, cardiology, and nephrology consulted. Echo, RHC done (see below). Noted to have partially loculated right pleural effusion. Diurectics, O2 and BiPAP prescribed.  Out-patient sleep study planned. Renal function felt overall stable.  - ED visit 12/21/16 for leg swelling. U/S negative for DVT.   Cardiology notes indicated that patient has been homeless in the past. January pulmonology notes indicated that patient was in the [employer] and was staying at a hotel with their assistance. I did not readdress status today. His cell number is [phone].   - PCP is Molli Barrows, FNP. Established care 10/26/16. Last visit 04/11/17.  - Pulmonologist is Dr. Baltazar Apo. Last visit 02/14/17. It was unclear if he would benefit from bronchodilators, but patient hesitant to start given side effects, so deferred at that time. Per telephone encounters by Dr. Lamonte Sakai and Desmond Dike on 04/11/17, patient is cleared from a pulmonary standpoint if he remains clinically stable. Sleep pulmonologist is Dr. Burnett Kanaris, last encounter 03/14/17.  - HF Cardiologist is Dr. Loralie Champagne. Last visit 02/15/17. He wrote, "On RHC, he had mild pulmonary hypertension with PVR 3.6 WU. Possible component of group 5 PAH vs. Group 3 from OHS/OSA. PVR not particularly high and severe parenchymal disease from sarcoidosis, suspect that there is not going to be much benefit from treatment with selective pulmonary vasodilators." He was aware that muscle biopsy was planned. He stopped statin for a longer period in case this was contributing to muscle weakness. 3 month follow-up planned. Patient is also getting Paramedicine visits, last on 04/08/17 with Marco Collie, EMT. - Nephrologist is Dr. Corliss Parish. - Neurologist is Dr. Narda Amber.   Meds include albuterol PRN, mag-ox, metformin, torsemide, tramadol.   BP (!) 156/81   Pulse 77   Temp 36.6 C   Resp 18   Ht 6' (1.829 m)   Wt 257 lb 12.8 oz (116.9 kg)   SpO2 95%   BMI 34.96 kg/m  Patient is able to walk independently but with deliberate gait. No conversational dyspnea noted. He is wearing 3L/. He denied SOB at rest as long as he is wearing home O2. He does get dyspnea on exertion with activities like walking in his home, carrying something, or walking at an incline. He feels his breathing is stable over the past few months. Reports weights have been stable.  He will occasionally have to take an extra torsemide for LE edema and has to watch his salt intake. He reports that he can sleep on his back with one pillow since starting CPAP. He admits  to smoking < 1 pack cigarettes/week and drinks an occasional couple of beers--more on weekends or with social activities. He inquired why we discouraged avoidance of alcohol right after surgery, but denied history of alcoholic withdrawal. On exam, heart RRR, no murmur noted. Lungs clear, mildly diminished right base.    EKG 12/21/16: SR with first degree AV block, atrial premature complexes, left atrial enlargement. RAD, incomplete right BBB, LPFB, anterior infarct (age undetermined).   [place] 11/11/16: Findings: Hemodynamics (mmHg) RA mean 6 RV 46/9 PA 49/20, mean 30 PCWP mean 10 Oxygen saturations: PA 66% AO 93% Cardiac Output (Fick) 5.57  Cardiac Index (Fick) 2.32 PVR 3.6 WU Conclusion: 1. Optimized filling pressures.   2. 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  With PVR not particularly high (3.6 WU), think that he probably would get much benefit from pulmonary vasodilator.  Will arrange for high resolution chest CT to assess for severity of pulmonary sarcoidosis.   Echo 11/06/16: Study Conclusions - Left ventricle: The cavity size was normal. Systolic function was   normal. The estimated ejection fraction was in the range of 55%   to 60%. Wall motion was normal; there were no regional wall   motion abnormalities. - Ventricular septum: The contour showed systolic flattening. These   changes are consistent with RV pressure overload. - Aortic root: The aortic root was mildly dilated. - Right ventricle: The cavity size was dilated. Systolic function   was moderately reduced. - Right atrium: The atrium was severely dilated. - Atrial septum: The septum bowed from right to left, consistent   with increased right atrial pressure. - Pulmonary arteries: Systolic pressure could not be accurately   estimated todue to lack of an adequate TR jet. the dilated RV,   septtal flattening and bowing fo the atrial septum to the right   suggests signififcant elevation of RVSP  and RAP. PA peak   pressure: 32 mm Hg (S).  CXR 12/21/16: IMPRESSION: Mild CHF. Moderate sized right pleural effusion, stable. No acute pneumonia.  CT Chest high resolution 11/11/16: IMPRESSION: 1. Overall, the appearance of the chest appears very similar to prior study 11/25/2014, and given the patient's history these findings are likely reflective of chronic sarcoidosis. No new acute findings are noted. 2. Severe dilatation of the pulmonic trunk and main pulmonary arteries, suggestive of underlying pulmonary arterial hypertension. 3. Cardiomegaly. 4. Additional incidental findings, as above. (See full report under Results Review tab).  PFTs 02/12/16: FVC 1.72 (38%), FEV1 1.23 (34%), DLCO cor 21.05 (61%). Conclusions: Severe airway obstruction is present. The diffusing capacity is high for the measured alveolar volume suggesting a chest wall limitation or reduced force generation as possible explanations for the restriction. The lack of response to bronchodilators may indicate a refractory state. Prolonged use may be of benefit. In addition, lung volumes are reduced indicating a concurrent restrictive process.  NCV with EMG 01/20/17: Impression: 1.  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 2.  Incidentally, there is a right median neuropathy at or distal to the wrist, consistent with the clinical diagnosis of carpal tunnel syndrome.  Overall these findings are moderate in degree electrically. 3.  There is no evidence of a sensorimotor neuropathy or cervical/lumbosacral radiculopathy.  Preoperative labs noted. Cr  1.01, BUN 41. K 4.7. WBC 3.7. H/H 14.5/45.1. PLT 153. Glucose 95. A1c 5.6.     Patient reports no acute or worsening cardiopulmonary symptoms since his last cardiology and pulmonology visits. He is anxious to find out a reason for his leg weakness and hopes the biopsy will give answers. He is compliant with home O2 and  BiPAP. We discussed that surgical risks are higher than the average person given his pulmonary status and could include need for post-operative ventilator, but fortunately, no new issues currently and his pulmonologist has signed off on surgery plans. Weights are currently stable as well, and he is without acute CHF symptoms. After discussion with anesthesiologist Dr. Oren Bracket, will communicate with Dr. Georgette Dover to clarify anticipated anesthesia needs for this procedure. MAC may be preferred if an appropriate option for planned procedure. (Update 04/27/17 9:15 AM: Per Dr. Georgette Dover, he thought MAC anesthesia would be an option if the anesthesiologist desired MAC over general anesthesia. There is no known patient or family history of malignant hyperthermia, but he has a potential undiagnosed muscle condition that could be causing his proximal muscle weakness--so defer to anesthesiologist the definitive anesthesia plan and recommended anesthesia precautions.)  [facility] Short Stay Center/Anesthesiology Phone [phone] 04/26/2017 5:36 PM

## 2017-04-27 ENCOUNTER — Ambulatory Visit: Payer: Medicaid Other | Admitting: Acute Care

## 2017-04-27 ENCOUNTER — Encounter: Payer: Self-pay | Admitting: Acute Care

## 2017-04-27 DIAGNOSIS — D869 Sarcoidosis, unspecified: Secondary | ICD-10-CM | POA: Diagnosis not present

## 2017-04-27 NOTE — Patient Instructions (Addendum)
It is good to meet you today. We will walk you on the Simply Go to ensure you are able to tolerate the pulsed oxygen without dropping your oxygen levels. Continue to use your albuterol nebs as needed for shortness of breath. Continue your Demadex as you have been doing. Continue on BiPAP at bedtime. You appear to be benefiting from the treatment Goal is to wear for at least 6 hours each night for maximal clinical benefit. Continue to work on weight loss, as the link between excess weight  and sleep apnea is well established.  Do not drive if sleepy. Remember to clean mask, tubing, filter, and reservoir once weekly with soapy water.  Please quit smoking Do not wear oxygen while smoking. You are at risk of flash burns and the tank can explode. Follow up with Dr.Byrum in 3 months  or before as needed.  Please contact office for sooner follow up if symptoms do not improve or worsen or seek emergency care

## 2017-04-27 NOTE — Anesthesia Preprocedure Evaluation (Addendum)
Anesthesia Evaluation  Patient identified by MRN, date of birth, ID band Patient awake    Reviewed: Allergy & Precautions, H&P , NPO status , Patient's Chart, lab work & pertinent test results, reviewed documented beta blocker date and time   Airway Mallampati: II  TM Distance: >3 FB Neck ROM: full    Dental no notable dental hx.    Pulmonary shortness of breath and at rest, sleep apnea , Current Smoker,  SARCOIDOSIS   CXR 12/21/16: IMPRESSION: Mild CHF. Moderate sized right pleural effusion, stable. No acute pneumonia.  CT Chest high resolution 11/11/16: IMPRESSION: 1. Overall, the appearance of the chest appears very similar to prior study 11/25/2014, and given the patient's history these findings are likely reflective of chronic sarcoidosis. No new acute findings are noted. 2. Severe dilatation of the pulmonic trunk and main pulmonary arteries, suggestive of underlying pulmonary arterial hypertension. 3. Cardiomegaly. 4. Additional incidental findings, as above. (See full report under Results Review tab).  PFTs 02/12/16: FVC 1.72 (38%), FEV1 1.23 (34%), DLCO cor 21.05 (61%). Conclusions: Severe airway obstruction is present. The diffusing capacity is high for the measured alveolar volume suggesting a chest wall limitation or reduced force generation as possible explanations for the restriction. The lack of response to bronchodilators may indicate a refractory state. Prolonged use may be of benefit. In addition, lung volumes are reduced indicating a concurrent restrictive process.      Pulmonary exam normal breath sounds clear to auscultation       Cardiovascular Exercise Tolerance: Poor hypertension, Pt. on medications +CHF   Rhythm:regular Rate:Normal  EKG 12/21/16: SR with first degree AV block, atrial premature complexes, left atrial enlargement. RAD, incomplete right BBB, LPFB, anterior infarct (age undetermined).    [place] 11/11/16: Findings: Hemodynamics (mmHg) RA mean 6 RV 46/9 PA 49/20, mean 30 PCWP mean 10 Oxygen saturations: PA 66% AO 93% Cardiac Output (Fick) 5.57  Cardiac Index (Fick) 2.32 PVR 3.6 WU Conclusion: 1. Optimized filling pressures.  2. 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 With PVR not particularly high (3.6 WU), think that he probably would get much benefit from pulmonary vasodilator.  Will arrange for high resolution chest CT to assess for severity of pulmonary sarcoidosis.   Echo 11/06/16: Study Conclusions - Left ventricle: The cavity size was normal. Systolic function was normal. The estimated ejection fraction was in the range of 55% to 60%. Wall motion was normal; there were no regional wall motion abnormalities. - Ventricular septum: The contour showed systolic flattening. These changes are consistent with RV pressure overload. - Aortic root: The aortic root was mildly dilated. - Right ventricle: The cavity size was dilated. Systolic function was moderately reduced. - Right atrium: The atrium was severely dilated. - Atrial septum: The septum bowed from right to left, consistent with increased right atrial pressure. - Pulmonary arteries: Systolic pressure could not be accurately estimated todue to lack of an adequate TR jet. the dilated RV, septtal flattening and bowing fo the atrial septum to the right suggests signififcant elevation of RVSP and RAP. PA peak pressure: 32 mm Hg (S).      Neuro/Psych  Neuromuscular disease negative psych ROS   GI/Hepatic negative GI ROS, Neg liver ROS,   Endo/Other  Morbid obesity  Renal/GU   negative genitourinary   Musculoskeletal  (+) Arthritis , Osteoarthritis,    Abdominal   Peds  Hematology negative hematology ROS (+)   Anesthesia Other Findings   Reproductive/Obstetrics negative OB ROS  Anesthesia  Physical Anesthesia Plan  ASA: III  Anesthesia Plan: MAC   Post-op Pain Management:    Induction:   PONV Risk Score and Plan: 1 and Ondansetron and Treatment may vary due to age or medical condition  Airway Management Planned: Mask, Natural Airway and Nasal Cannula  Additional Equipment:   Intra-op Plan:   Post-operative Plan:   Informed Consent: I have reviewed the patients History and Physical, chart, labs and discussed the procedure including the risks, benefits and alternatives for the proposed anesthesia with the patient or authorized representative who has indicated his/her understanding and acceptance.   Dental Advisory Given  Plan Discussed with: CRNA, Anesthesiologist and Surgeon  Anesthesia Plan Comments:        Anesthesia Quick Evaluation

## 2017-04-27 NOTE — Assessment & Plan Note (Signed)
Face to face walk for POC Walked on 3 L on Simply Go, saturations did not drop Plan: We will walk you on the Simply Go to ensure you are able to tolerate the pulsed oxygen without dropping your oxygen levels. Continue to use your albuterol nebs as needed for shortness of breath. Continue your Demadex as you have been doing. Continue on BiPAP at bedtime. You appear to be benefiting from the treatment Goal is to wear for at least 6 hours each night for maximal clinical benefit. Continue to work on weight loss, as the link between excess weight  and sleep apnea is well established.  Do not drive if sleepy. Remember to clean mask, tubing, filter, and reservoir once weekly with soapy water.  Please quit smoking Do not wear oxygen while smoking. You are at risk of flash burns and the tank can explode. Follow up with Dr.Byrum in 3 months  or before as needed.  Please contact office for sooner follow up if symptoms do not improve or worsen or seek emergency care

## 2017-04-27 NOTE — Progress Notes (Signed)
History of Present Illness Hunter GasserRobert [name] is a 47 y.o. 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 Delton CoombesByrum.  HPI  47 year old man with a history of biopsy-proven sarcoidosis, skin biopsy 2002, off steroids since '07.  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  He was admitted in September 2018 with right heart failure, significant volume overload and decompensated pulmonary hypertension.  He has had pulmonary function testing 02/12/16 that showed evidence for mixed obstruction and restriction on spirometry, significant restriction on lung volumes.  CT scan performed on 11/11/16 was reviewed by me.  This shows no evidence of mediastinal or hilar lymphadenopathy (no contrast), chronic right-sided pleural thickening with some associated calcification, trace right-sided pleural effusion, chronic areas of linear scarring bilaterally.  No evidence of groundglass, consolidation. He is chronically on 3L/min, was sent home from recent hospitalization with BiPAP.  04/27/2017  Pt. Presents for face to face for POC per his DME. He remains on 3 L oxygen per minute, and uses his BIPAP every night 15/12. ( There is a note that he needs a titration study) . We will walk him on the POC we have in the office to ensure he maintains adequate oxygen saturations upon with the pulsed oxygen.He states he is at baseline re: his sarcoid. He states he is compliant with his BiPAP machine every night. He uses it with oxygen bleed in. He states he has no issues with the machine. He  did bring his SIM card today, but there is another patient's name  on it. We cannot use the down Load until this has been corrected. The down Load does indicate good compliance with use 99% of the time and for about 10 hours daily. AHI shows 0.8. We have called [facility] to correct this problem.Pt. Denies fever, chest pain, orthopnea or hemoptysis. He states he has had an eye exam within the year. We will walk him with the simply go to ensure his saturations do not drop with the pulsed flow. His weight and edema have been stable.  Test Results:  CBC Latest Ref Rng & Units 04/26/2017 12/21/2016 11/23/2016  WBC 4.0 - 10.5 K/uL 3.7(L) 5.3 3.9  Hemoglobin 13.0 - 17.0 g/dL 16.114.5 09.613.9 04.514.7  Hematocrit 39.0 - 52.0 % 45.1 47.1 50.1(H)  Platelets 150 - 400 K/uL 153 138(L) 177    BMP Latest Ref Rng & Units 04/26/2017 02/15/2017 12/21/2016  Glucose 65 - 99 mg/dL 95 84 409(W114(H)  BUN 6 - 20 mg/dL 11(B41(H) 14(N30(H) 82(N34(H)  Creatinine 0.61 - 1.24 mg/dL 5.621.01 1.300.81 8.650.92  BUN/Creat Ratio 6 - 22 (calc) - - -  Sodium 135 - 145 mmol/L 139 138 139  Potassium 3.5 - 5.1 mmol/L 4.7 4.4 4.2  Chloride 101 - 111 mmol/L 101 100(L) 97(L)  CO2 22 - 32 mmol/L 26 32 34(H)  Calcium 8.9 - 10.3 mg/dL 9.1 7.8(I8.8(L) 8.9    BNP    Component Value Date/Time   BNP 160.7 (H) 12/21/2016 0856   BNP 250 (H) 11/23/2016 1056    ProBNP No results found for: PROBNP  PFT    Component Value Date/Time   FEV1PRE 1.23 02/12/2016 1014   FEV1POST 1.36 02/12/2016 1014  FVCPRE 1.72 02/12/2016 1014   FVCPOST 1.80 02/12/2016 1014   TLC 3.99 02/12/2016 1014   DLCOUNC 21.56 02/12/2016 1014   PREFEV1FVCRT 71 02/12/2016 1014   PSTFEV1FVCRT 75 02/12/2016 1014    No results found.   Past medical hx Past Medical History:  Diagnosis Date  . Arthritis    feet  . CHF (congestive heart failure) (HCC)   . Dyspnea   . Hypertension   . Nephrotic syndrome   . Pre-diabetes   . Sarcoid   . Sleep apnea      Social History   Tobacco Use  .  Smoking status: Current Some Day Smoker    Packs/day: 0.10    Years: 10.00    Pack years: 1.00    Types: Cigarettes  . Smokeless tobacco: Never Used  Substance Use Topics  . Alcohol use: Yes    Alcohol/week: 1.8 oz    Types: 3 Cans of beer per week  . Drug use: No    Mr.Coulston reports that he has been smoking cigarettes.  He has a 1.00 pack-year smoking history. he has never used smokeless tobacco. He reports that he drinks about 1.8 oz of alcohol per week. He reports that he does not use drugs.  Tobacco Cessation: Current some day smoker  Past surgical hx, Family hx, Social hx all reviewed.  Current Outpatient Medications on File Prior to Visit  Medication Sig  . albuterol (PROVENTIL HFA;VENTOLIN HFA) 108 (90 Base) MCG/ACT inhaler Inhale 2 puffs into the lungs every 6 (six) hours as needed for wheezing or shortness of breath.  . ciclopirox (PENLAC) 8 % solution APPLY TOPICALLY AT BEDTIME OVER NAIL AND SURROUNDING SKIN. APPLY DAILY OVER PREVIOUS COAT. AFTER 7 DAYS, MAY REMOVE WITH ALCOHOL AND CONTINU  . diclofenac sodium (VOLTAREN) 1 % GEL APPLY 2 GRAMS TO FOOT (AND AFFECTED AREAS) 2-4 TIMES DAILY.  Marland Kitchen Foot Care Products (TRI-BALANCE ORTHOTICS MENS) MISC Dispense 1 pair of orthotic inserts to facilitate improved balance, gait, and mobility.  Marland Kitchen loratadine (CLARITIN) 10 MG tablet Take 1 tablet (10 mg total) by mouth daily.  . magnesium oxide (MAG-OX) 400 (241.3 Mg) MG tablet Take 1 tablet (400 mg total) every evening by mouth.  . metFORMIN (GLUCOPHAGE) 500 MG tablet Take 1 tablet (500 mg total) by mouth 2 (two) times daily with a meal.  . torsemide (DEMADEX) 20 MG tablet Take 2 tablets (40 mg total) by mouth daily. (Patient taking differently: Take 20 mg by mouth 2 (two) times daily. )  . traMADol (ULTRAM) 50 MG tablet Take 1 tablet (50 mg total) every 6 (six) hours as needed by mouth. (Patient taking differently: Take 50 mg by mouth every 6 (six) hours as needed (for pain). )   No  current facility-administered medications on file prior to visit.      No Active Allergies  Review Of Systems:  Constitutional:   No  weight loss, night sweats,  Fevers, chills, +fatigue, or  lassitude.  HEENT:   No headaches,  Difficulty swallowing,  Tooth/dental problems, or  Sore throat,                No sneezing, itching, ear ache, nasal congestion, post nasal drip,   CV:  No chest pain,  Orthopnea, PND, trace swelling in lower extremities, No  anasarca, dizziness, palpitations, syncope.   GI  No heartburn, indigestion, abdominal pain, nausea, vomiting, diarrhea, change in bowel habits, loss of appetite, bloody stools.   Resp: + baseline  shortness of breath  with exertion less at rest.  No excess mucus, no productive cough,  No non-productive cough,  No coughing up of blood.  No change in color of mucus.  No wheezing.  No chest wall deformity  Skin: no rash or lesions.  GU: no dysuria, change in color of urine, no urgency or frequency.  No flank pain, no hematuria   MS:  No joint pain or swelling.  No decreased range of motion.  No back pain.  Psych:  No change in mood or affect. No depression or anxiety.  No memory loss.   Vital Signs BP (!) 152/100 (BP Location: Left Arm, Cuff Size: Normal)   Pulse 80   Ht 6' (1.829 m)   Wt 258 lb 12.8 oz (117.4 kg)   SpO2 96%   BMI 35.10 kg/m    Physical Exam:  General- No distress,  A&Ox3, pleasant, wearing nasal oxygen at 3 L ENT: No sinus tenderness, TM clear, pale nasal mucosa, no oral exudate,no post nasal drip, no LAN Cardiac: S1, S2, regular rate and rhythm, no murmur Chest: No wheeze/ rales/ dullness; no accessory muscle use, no nasal flaring, no sternal retractions Abd.: Soft Non-tender, non-distended, obese Ext: No clubbing cyanosis, trace  Edema BLE Neuro:Deconditioned at baseline, MAE x 4, A&O x 3 Skin: No rashes, warm and dry Psych: normal mood and behavior   Assessment/Plan  Sarcoidosis (HCC) Face to face  walk for POC Walked on 3 L on Simply Go, saturations did not drop Plan: We will walk you on the Simply Go to ensure you are able to tolerate the pulsed oxygen without dropping your oxygen levels. Continue to use your albuterol nebs as needed for shortness of breath. Continue your Demadex as you have been doing. Continue on BiPAP at bedtime. You appear to be benefiting from the treatment Goal is to wear for at least 6 hours each night for maximal clinical benefit. Continue to work on weight loss, as the link between excess weight  and sleep apnea is well established.  Do not drive if sleepy. Remember to clean mask, tubing, filter, and reservoir once weekly with soapy water.  Please quit smoking Do not wear oxygen while smoking. You are at risk of flash burns and the tank can explode. Follow up with Dr.Byrum in 3 months  or before as needed.  Please contact office for sooner follow up if symptoms do not improve or worsen or seek emergency care      Bevelyn Ngo, NP 04/27/2017  5:18 PM

## 2017-04-27 NOTE — Telephone Encounter (Signed)
Patient called to thank the provider for helping with the stockings and insert that he needed.

## 2017-04-28 ENCOUNTER — Encounter: Payer: Self-pay | Admitting: Family Medicine

## 2017-04-28 ENCOUNTER — Telehealth: Payer: Self-pay | Admitting: Emergency Medicine

## 2017-04-28 DIAGNOSIS — D869 Sarcoidosis, unspecified: Secondary | ICD-10-CM

## 2017-04-28 NOTE — Telephone Encounter (Signed)
Ok we will send as soon as order is signed thanks Tobe SosSally E Ottinger

## 2017-04-28 NOTE — Telephone Encounter (Signed)
Spoke with [facility], Order corrected. PCC's FYI

## 2017-04-28 NOTE — Addendum Note (Signed)
Addended by: Cydney OkAUGUSTIN, Anah Billard N on: 04/28/2017 08:56 AM   Modules accepted: Orders

## 2017-04-29 ENCOUNTER — Encounter: Payer: Self-pay | Admitting: Podiatry

## 2017-04-29 ENCOUNTER — Ambulatory Visit (INDEPENDENT_AMBULATORY_CARE_PROVIDER_SITE_OTHER): Payer: Medicaid Other | Admitting: Podiatry

## 2017-04-29 ENCOUNTER — Other Ambulatory Visit (HOSPITAL_COMMUNITY): Payer: Self-pay

## 2017-04-29 DIAGNOSIS — M79674 Pain in right toe(s): Secondary | ICD-10-CM

## 2017-04-29 DIAGNOSIS — R29898 Other symptoms and signs involving the musculoskeletal system: Secondary | ICD-10-CM | POA: Diagnosis not present

## 2017-04-29 DIAGNOSIS — M19071 Primary osteoarthritis, right ankle and foot: Secondary | ICD-10-CM

## 2017-04-29 DIAGNOSIS — M79675 Pain in left toe(s): Secondary | ICD-10-CM

## 2017-04-29 DIAGNOSIS — B351 Tinea unguium: Secondary | ICD-10-CM | POA: Diagnosis not present

## 2017-04-29 NOTE — Progress Notes (Signed)
Paramedicine Encounter    Patient ID: Hunter GasserRobert [name], male    DOB: [birth year], 47 y.o.   MRN: [id number]   Patient Care Team: Bing NeighborsHarris, Kimberly S, FNP as PCP - General (Family Medicine)  Patient Active Problem List   Diagnosis Date Noted  . 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 Chronic diastolic CHF (congestive heart failure) (HCC)   . Acute right-sided CHF (congestive heart failure) (HCC)   . Pleural effusion, right   . Acute renal failure with acute tubular necrosis superimposed on stage 3 chronic kidney disease (HCC)   . Prediabetes   . Sarcoidosis of lung (HCC)   . Generalized edema   . Orthostatic hypotension 11/14/2016  . RVF (right ventricular failure) (HCC)   . Pressure injury of skin 11/09/2016  . Acute on chronic respiratory failure with hypoxia and hypercapnia (HCC)   . Pulmonary edema 11/06/2016  . Restrictive lung disease secondary to obesity 11/06/2016  . Obesity hypoventilation syndrome (HCC) 11/06/2016  . OSA (obstructive sleep apnea) 11/06/2016  . CHF (congestive heart failure) (HCC) 11/05/2016  . HTN (hypertension) 11/02/2016  . Hypersomnia 03/02/2016  . Sarcoidosis 02/18/2016  . Hypoxemia 02/18/2016  . Morbid obesity (HCC) 02/18/2016    Current Outpatient Medications:  .  albuterol (PROVENTIL HFA;VENTOLIN HFA) 108 (90 Base) MCG/ACT inhaler, Inhale 2 puffs into the lungs every 6 (six) hours as needed for wheezing or shortness of breath., Disp: 1 Inhaler, Rfl: 5 .  ciclopirox (PENLAC) 8 % solution, APPLY TOPICALLY AT BEDTIME OVER NAIL AND SURROUNDING SKIN. APPLY DAILY OVER PREVIOUS COAT. AFTER 7 DAYS, MAY REMOVE WITH ALCOHOL AND CONTINU, Disp: 6.6 mL, Rfl: 0 .  diclofenac sodium (VOLTAREN) 1 % GEL, APPLY 2 GRAMS TO FOOT (AND AFFECTED AREAS) 2-4 TIMES DAILY., Disp: 100 g, Rfl: 0 .  magnesium oxide (MAG-OX) 400 (241.3 Mg) MG tablet, Take 1 tablet (400 mg total) every evening by mouth., Disp: 90 tablet, Rfl: 2 .  metFORMIN (GLUCOPHAGE) 500 MG  tablet, Take 1 tablet (500 mg total) by mouth 2 (two) times daily with a meal., Disp: 180 tablet, Rfl: 3 .  torsemide (DEMADEX) 20 MG tablet, Take 2 tablets (40 mg total) by mouth daily. (Patient taking differently: Take 20 mg by mouth 2 (two) times daily. ), Disp: 90 tablet, Rfl: 6 .  traMADol (ULTRAM) 50 MG tablet, Take 1 tablet (50 mg total) every 6 (six) hours as needed by mouth. (Patient taking differently: Take 50 mg by mouth every 6 (six) hours as needed (for pain). ), Disp: 15 tablet, Rfl: 0 .  Foot Care Products (TRI-BALANCE ORTHOTICS MENS) MISC, Dispense 1 pair of orthotic inserts to facilitate improved balance, gait, and mobility. (Patient not taking: Reported on 04/29/2017), Disp: 2 each, Rfl: 0 .  loratadine (CLARITIN) 10 MG tablet, Take 1 tablet (10 mg total) by mouth daily. (Patient not taking: Reported on 04/29/2017), Disp: 30 tablet, Rfl: 0 No Active Allergies    Social History   Socioeconomic History  . Marital status: Single    Spouse name: Not on file  . Number of children: 0  . Years of education: 5612  . Highest education level: Not on file  Occupational History  . Not on file  Social Needs  . Financial resource strain: Not on file  . Food insecurity:    Worry: Not on file    Inability: Not on file  . Transportation needs:    Medical: Not on file    Non-medical: Not on file  Tobacco Use  .  Smoking status: Current Some Day Smoker    Packs/day: 0.10    Years: 10.00    Pack years: 1.00    Types: Cigarettes  . Smokeless tobacco: Never Used  Substance and Sexual Activity  . Alcohol use: Yes    Alcohol/week: 1.8 oz    Types: 3 Cans of beer per week  . Drug use: No  . Sexual activity: Not on file  Lifestyle  . Physical activity:    Days per week: Not on file    Minutes per session: Not on file  . Stress: Not on file  Relationships  . Social connections:    Talks on phone: Not on file    Gets together: Not on file    Attends religious service: Not on file     Active member of club or organization: Not on file    Attends meetings of clubs or organizations: Not on file    Relationship status: Not on file  . Intimate partner violence:    Fear of current or ex partner: Not on file    Emotionally abused: Not on file    Physically abused: Not on file    Forced sexual activity: Not on file  Other Topics Concern  . Not on file  Social History Narrative   Lives alone.  Currently homeless and staying on the 3rd floor of a hotel with an elevator.     No children.  Education: high school.      Physical Exam  Constitutional: He is oriented to person, place, and time.  Cardiovascular: Normal rate and regular rhythm.  Pulmonary/Chest: Effort normal and breath sounds normal. No respiratory distress. He has no wheezes. He has no rales.  Abdominal: Soft. He exhibits no distension.  Musculoskeletal: Normal range of motion. He exhibits no edema.  Neurological: He is alert and oriented to person, place, and time.  Skin: Skin is warm and dry.  Psychiatric: He has a normal mood and affect.        Future Appointments  Date Time Provider Department Center  04/29/2017  1:15 PM Vivi Barrack, DPM [facility]  07/25/2017 11:00 AM Bing Neighbors, FNP [facility] None    BP 138/88 (BP Location: Left Arm, Patient Position: Sitting, Cuff Size: Large)   Pulse 80   Resp 16   Wt 258 lb (117 kg)   SpO2 96%   BMI 34.99 kg/m   Weight yesterday- 258 lb Last visit weight- 258 lb   Hunter [name] was seen at home today and reported feeling generally well. He is taking his medications as prescribed and is able to fill his own pillbox without difficulty. He stated his weight is fluctuating day to day but it is not more than 1 or two pounds. He stated he did have substantial weight gain last week but said he was eating and drinking more that he should. He said he corrected those habits and his weight came back down. He is scheduled for a muscle biopsy to try  and determine the cause of his persistent weakness. We discussed his progress in dealing with his HF dx and are going to maintain visits every two weeks until June at which time we will discharge him from paramedicine.   Time spent with patient: 28 minutes  Jacqualine Code, EMT 04/29/17  ACTION: Home visit completed

## 2017-05-01 NOTE — Progress Notes (Signed)
Subjective: Molly MaduroRobert presents the office today for follow-up evaluation.  Since I last saw him he is ordered orthotics from outside company.  He just got molded for the other day and he states that they should be in about 2 weeks.  Since I last saw him he is been using a topical antifungal to his toenail.  He is confused about both of the medications are prescribed, Voltaren gel as well as Penlac.  He would like to know about how to apply these medications.  He also scheduled for muscle biopsy.  He is continue to follow with neurology as well. Denies any systemic complaints such as fevers, chills, nausea, vomiting. No acute changes since last appointment, and no other complaints at this time.   Objective: AAO x3, NAD DP/PT pulses palpable bilaterally, CRT less than 3 seconds Overall the exam to his feet appears to be unchanged.  There is a decrease in medial arch upon weightbearing.  There is minimal discomfort on the dorsal medial aspect of the midfoot along the talonavicular joint.  There is no specific area pinpoint bony tenderness or pain to vibratory sensation.  There is no overlying edema, erythema, increase in warmth bilaterally.  The nails appear to be hypertrophic, dystrophic, discolored with ill-defined discoloration.  There is no surrounding redness or drainage of the toenail sites.  He does state the nails get painful inside of shoes due to pressure and irritation. No open lesions or pre-ulcerative lesions.  No pain with calf compression, swelling, warmth, erythema  Assessment: Bilateral foot pain, onychomycosis  Plan: -All treatment options discussed with the patient including all alternatives, risks, complications.  -I did sharply debride the nails x10 today without any complications or bleeding at his request. -Discussed the Penlac as for nail fungus he is to apply that once a day and we discussed how to clean it once a week. -Discussed with the Voltaren gel is for pain to the foot he  can use this as needed.  He was putting the Voltaren gel around the toenail. -Awaiting orthotics.  Also discussed shoe modifications. -Continue follow-up with neurology. -I will see him back in about 2 months.  Over that time we will have the muscle biopsy results and he will try the orthotics for some time hopefully. -Patient encouraged to call the office with any questions, concerns, change in symptoms.   Vivi BarrackMatthew R Wagoner DPM

## 2017-05-02 NOTE — Telephone Encounter (Signed)
Yes it was sent on Friday 04/29/17 to [facility] still waiting for confirmation back

## 2017-05-02 NOTE — Telephone Encounter (Signed)
Closed by mistake

## 2017-05-02 NOTE — Telephone Encounter (Signed)
PCCs, please advise if this order has been signed already. Thanks!

## 2017-05-03 ENCOUNTER — Encounter (HOSPITAL_COMMUNITY): Payer: Self-pay | Admitting: *Deleted

## 2017-05-03 ENCOUNTER — Encounter (HOSPITAL_COMMUNITY)
Admission: RE | Disposition: A | Discharge status: Home / Self Care | Payer: Self-pay | Source: Ambulatory Visit | Attending: Surgery

## 2017-05-03 ENCOUNTER — Ambulatory Visit (HOSPITAL_COMMUNITY)
Admission: RE | Admit: 2017-05-03 | Discharge: 2017-05-03 | Disposition: A | Discharge status: Home / Self Care | Payer: Medicaid Other | Source: Ambulatory Visit | Attending: Surgery | Admitting: Surgery

## 2017-05-03 ENCOUNTER — Ambulatory Visit (HOSPITAL_COMMUNITY): Payer: Medicaid Other | Admitting: Certified Registered"

## 2017-05-03 ENCOUNTER — Ambulatory Visit (HOSPITAL_COMMUNITY): Payer: Medicaid Other | Admitting: Vascular Surgery

## 2017-05-03 ENCOUNTER — Other Ambulatory Visit: Payer: Self-pay

## 2017-05-03 DIAGNOSIS — N186 End stage renal disease: Secondary | ICD-10-CM | POA: Insufficient documentation

## 2017-05-03 DIAGNOSIS — Z79899 Other long term (current) drug therapy: Secondary | ICD-10-CM | POA: Diagnosis not present

## 2017-05-03 DIAGNOSIS — G7249 Other inflammatory and immune myopathies, not elsewhere classified: Secondary | ICD-10-CM | POA: Diagnosis not present

## 2017-05-03 DIAGNOSIS — Z9981 Dependence on supplemental oxygen: Secondary | ICD-10-CM | POA: Diagnosis not present

## 2017-05-03 DIAGNOSIS — Z7984 Long term (current) use of oral hypoglycemic drugs: Secondary | ICD-10-CM | POA: Diagnosis not present

## 2017-05-03 DIAGNOSIS — F17200 Nicotine dependence, unspecified, uncomplicated: Secondary | ICD-10-CM | POA: Diagnosis not present

## 2017-05-03 DIAGNOSIS — I509 Heart failure, unspecified: Secondary | ICD-10-CM | POA: Insufficient documentation

## 2017-05-03 DIAGNOSIS — R531 Weakness: Secondary | ICD-10-CM | POA: Diagnosis present

## 2017-05-03 DIAGNOSIS — G4730 Sleep apnea, unspecified: Secondary | ICD-10-CM | POA: Diagnosis not present

## 2017-05-03 HISTORY — PX: MUSCLE BIOPSY: SHX716

## 2017-05-03 LAB — GLUCOSE, CAPILLARY: Glucose-Capillary: 95 mg/dL (ref 65–99)

## 2017-05-03 SURGERY — MUSCLE BIOPSY
Anesthesia: Monitor Anesthesia Care | Site: Arm Upper | Laterality: Right

## 2017-05-03 MED ORDER — FENTANYL CITRATE (PF) 100 MCG/2ML IJ SOLN: 25.0000 ug | INTRAMUSCULAR | Status: DC | PRN

## 2017-05-03 MED ORDER — PROPOFOL 10 MG/ML IV BOLUS
INTRAVENOUS | Status: DC | PRN
  Administered 2017-05-03: 30 mg via INTRAVENOUS

## 2017-05-03 MED ORDER — MIDAZOLAM HCL 2 MG/2ML IJ SOLN
INTRAMUSCULAR | Status: AC
  Filled 2017-05-03: qty 2

## 2017-05-03 MED ORDER — BUPIVACAINE-EPINEPHRINE 0.25% -1:200000 IJ SOLN
INTRAMUSCULAR | Status: DC | PRN
  Administered 2017-05-03: 25 mL

## 2017-05-03 MED ORDER — CHLORHEXIDINE GLUCONATE CLOTH 2 % EX PADS: 6.0000 | MEDICATED_PAD | Freq: Once | CUTANEOUS | Status: DC

## 2017-05-03 MED ORDER — MIDAZOLAM HCL 2 MG/2ML IJ SOLN: 2.0000 mg | Freq: Once | INTRAMUSCULAR | Status: DC

## 2017-05-03 MED ORDER — PROPOFOL 1000 MG/100ML IV EMUL
INTRAVENOUS | Status: AC
Start: 2017-05-03 — End: ?
  Filled 2017-05-03: qty 100

## 2017-05-03 MED ORDER — FENTANYL CITRATE (PF) 250 MCG/5ML IJ SOLN
INTRAMUSCULAR | Status: AC
  Filled 2017-05-03: qty 5

## 2017-05-03 MED ORDER — OXYCODONE HCL 5 MG/5ML PO SOLN: 5.0000 mg | Freq: Once | ORAL | Status: DC | PRN

## 2017-05-03 MED ORDER — CEFAZOLIN SODIUM-DEXTROSE 2-4 GM/100ML-% IV SOLN
2.0000 g | INTRAVENOUS | Status: AC
  Administered 2017-05-03: 2 g via INTRAVENOUS
  Filled 2017-05-03: qty 100

## 2017-05-03 MED ORDER — MIDAZOLAM HCL 2 MG/2ML IJ SOLN
INTRAMUSCULAR | Status: DC | PRN
  Administered 2017-05-03: 2 mg via INTRAVENOUS

## 2017-05-03 MED ORDER — ONDANSETRON HCL 4 MG/2ML IJ SOLN
INTRAMUSCULAR | Status: AC
Start: 2017-05-03 — End: ?
  Filled 2017-05-03: qty 4

## 2017-05-03 MED ORDER — DEXAMETHASONE SODIUM PHOSPHATE 10 MG/ML IJ SOLN
INTRAMUSCULAR | Status: AC
  Filled 2017-05-03: qty 2

## 2017-05-03 MED ORDER — ONDANSETRON HCL 4 MG/2ML IJ SOLN: 4.0000 mg | Freq: Once | INTRAMUSCULAR | Status: DC | PRN

## 2017-05-03 MED ORDER — ACETAMINOPHEN 160 MG/5ML PO SOLN: 325.0000 mg | ORAL | Status: DC | PRN

## 2017-05-03 MED ORDER — PHENYLEPHRINE 40 MCG/ML (10ML) SYRINGE FOR IV PUSH (FOR BLOOD PRESSURE SUPPORT)
PREFILLED_SYRINGE | INTRAVENOUS | Status: AC
  Filled 2017-05-03: qty 10

## 2017-05-03 MED ORDER — LACTATED RINGERS IV SOLN
INTRAVENOUS | Status: DC | PRN
  Administered 2017-05-03: 12:00:00 via INTRAVENOUS

## 2017-05-03 MED ORDER — OXYCODONE HCL 5 MG PO TABS: 5.0000 mg | ORAL_TABLET | Freq: Once | ORAL | Status: DC | PRN

## 2017-05-03 MED ORDER — ONDANSETRON HCL 4 MG/2ML IJ SOLN
INTRAMUSCULAR | Status: AC
  Filled 2017-05-03: qty 2

## 2017-05-03 MED ORDER — LIDOCAINE HCL (CARDIAC) 20 MG/ML IV SOLN
INTRAVENOUS | Status: AC
  Filled 2017-05-03: qty 10

## 2017-05-03 MED ORDER — GLYCOPYRROLATE 0.2 MG/ML IV SOSY
PREFILLED_SYRINGE | INTRAVENOUS | Status: DC | PRN
  Administered 2017-05-03: .2 mg via INTRAVENOUS

## 2017-05-03 MED ORDER — ONDANSETRON HCL 4 MG/2ML IJ SOLN
INTRAMUSCULAR | Status: DC | PRN
  Administered 2017-05-03: 4 mg via INTRAVENOUS

## 2017-05-03 MED ORDER — ACETAMINOPHEN 325 MG PO TABS: 325.0000 mg | ORAL_TABLET | ORAL | Status: DC | PRN

## 2017-05-03 MED ORDER — HYDROCODONE-ACETAMINOPHEN 5-325 MG PO TABS: 1.0000 | ORAL_TABLET | Freq: Four times a day (QID) | ORAL | 0 refills | Status: DC | PRN

## 2017-05-03 MED ORDER — LABETALOL HCL 5 MG/ML IV SOLN
INTRAVENOUS | Status: AC
  Filled 2017-05-03: qty 4

## 2017-05-03 MED ORDER — 0.9 % SODIUM CHLORIDE (POUR BTL) OPTIME
TOPICAL | Status: DC | PRN
  Administered 2017-05-03: 1000 mL

## 2017-05-03 MED ORDER — KETOROLAC TROMETHAMINE 30 MG/ML IJ SOLN: 30.0000 mg | Freq: Once | INTRAMUSCULAR | Status: DC | PRN

## 2017-05-03 MED ORDER — PROPOFOL 500 MG/50ML IV EMUL
INTRAVENOUS | Status: DC | PRN
  Administered 2017-05-03: 200 ug/kg/min via INTRAVENOUS

## 2017-05-03 MED ORDER — FENTANYL CITRATE (PF) 250 MCG/5ML IJ SOLN
INTRAMUSCULAR | Status: DC | PRN
  Administered 2017-05-03: 25 ug via INTRAVENOUS
  Administered 2017-05-03 (×2): 50 ug via INTRAVENOUS

## 2017-05-03 MED ORDER — EPHEDRINE 5 MG/ML INJ
INTRAVENOUS | Status: AC
  Filled 2017-05-03: qty 10

## 2017-05-03 MED ORDER — BUPIVACAINE-EPINEPHRINE (PF) 0.25% -1:200000 IJ SOLN
INTRAMUSCULAR | Status: AC
Start: 2017-05-03 — End: ?
  Filled 2017-05-03: qty 30

## 2017-05-03 MED ORDER — MEPERIDINE HCL 50 MG/ML IJ SOLN: 6.2500 mg | INTRAMUSCULAR | Status: DC | PRN

## 2017-05-03 MED ORDER — ROCURONIUM BROMIDE 10 MG/ML (PF) SYRINGE
PREFILLED_SYRINGE | INTRAVENOUS | Status: AC
Start: 2017-05-03 — End: ?
  Filled 2017-05-03: qty 10

## 2017-05-03 SURGICAL SUPPLY — 44 items
ADH SKN CLS APL DERMABOND .7 (GAUZE/BANDAGES/DRESSINGS)
APL SKNCLS STERI-STRIP NONHPOA (GAUZE/BANDAGES/DRESSINGS) ×1
BENZOIN TINCTURE PRP APPL 2/3 (GAUZE/BANDAGES/DRESSINGS) ×1 IMPLANT
CANISTER SUCT 3000ML PPV (MISCELLANEOUS) IMPLANT
CHLORAPREP W/TINT 10.5 ML (MISCELLANEOUS) ×2 IMPLANT
CHLORAPREP W/TINT 26ML (MISCELLANEOUS) ×3 IMPLANT
CONT SPEC 4OZ CLIKSEAL STRL BL (MISCELLANEOUS) ×2 IMPLANT
COVER SURGICAL LIGHT HANDLE (MISCELLANEOUS) ×2 IMPLANT
DERMABOND ADVANCED (GAUZE/BANDAGES/DRESSINGS)
DERMABOND ADVANCED .7 DNX12 (GAUZE/BANDAGES/DRESSINGS) ×1 IMPLANT
DRAPE LAPAROTOMY 100X72 PEDS (DRAPES) ×2 IMPLANT
DRAPE ORTHO SPLIT 87X125 STRL (DRAPES) ×1 IMPLANT
DRAPE UTILITY XL STRL (DRAPES) ×4 IMPLANT
DRSG TEGADERM 4X4.75 (GAUZE/BANDAGES/DRESSINGS) ×2 IMPLANT
DRSG TELFA 3X8 NADH (GAUZE/BANDAGES/DRESSINGS) ×2 IMPLANT
ELECT CAUTERY BLADE 6.4 (BLADE) ×2 IMPLANT
ELECT REM PT RETURN 9FT ADLT (ELECTROSURGICAL) ×2
ELECTRODE REM PT RTRN 9FT ADLT (ELECTROSURGICAL) ×1 IMPLANT
GAUZE SPONGE 2X2 8PLY STRL LF (GAUZE/BANDAGES/DRESSINGS) IMPLANT
GAUZE SPONGE 4X4 16PLY XRAY LF (GAUZE/BANDAGES/DRESSINGS) ×2 IMPLANT
GLOVE BIO SURGEON STRL SZ7 (GLOVE) ×2 IMPLANT
GLOVE BIOGEL PI IND STRL 7.5 (GLOVE) ×1 IMPLANT
GLOVE BIOGEL PI INDICATOR 7.5 (GLOVE) ×1
GOWN STRL REUS W/ TWL LRG LVL3 (GOWN DISPOSABLE) ×2 IMPLANT
GOWN STRL REUS W/TWL LRG LVL3 (GOWN DISPOSABLE) ×4
KIT BASIN OR (CUSTOM PROCEDURE TRAY) ×2 IMPLANT
KIT ROOM TURNOVER OR (KITS) ×2 IMPLANT
NDL HYPO 25GX1X1/2 BEV (NEEDLE) ×1 IMPLANT
NEEDLE HYPO 25GX1X1/2 BEV (NEEDLE) ×2 IMPLANT
NS IRRIG 1000ML POUR BTL (IV SOLUTION) ×2 IMPLANT
PACK SURGICAL SETUP 50X90 (CUSTOM PROCEDURE TRAY) ×2 IMPLANT
PAD ARMBOARD 7.5X6 YLW CONV (MISCELLANEOUS) ×4 IMPLANT
PAD DRESSING TELFA 3X8 NADH (GAUZE/BANDAGES/DRESSINGS) ×1 IMPLANT
PENCIL BUTTON HOLSTER BLD 10FT (ELECTRODE) ×2 IMPLANT
SPONGE GAUZE 2X2 STER 10/PKG (GAUZE/BANDAGES/DRESSINGS) ×1
STRIP CLOSURE SKIN 1/2X4 (GAUZE/BANDAGES/DRESSINGS) ×1 IMPLANT
SUT MON AB 4-0 PC3 18 (SUTURE) ×3 IMPLANT
SUT VIC AB 3-0 SH 18 (SUTURE) ×2 IMPLANT
SYR BULB 3OZ (MISCELLANEOUS) ×1 IMPLANT
SYR CONTROL 10ML LL (SYRINGE) ×2 IMPLANT
TOWEL OR 17X24 6PK STRL BLUE (TOWEL DISPOSABLE) ×2 IMPLANT
TOWEL OR 17X26 10 PK STRL BLUE (TOWEL DISPOSABLE) ×2 IMPLANT
TUBE CONNECTING 12X1/4 (SUCTIONS) IMPLANT
YANKAUER SUCT BULB TIP NO VENT (SUCTIONS) IMPLANT

## 2017-05-03 NOTE — Anesthesia Postprocedure Evaluation (Signed)
Anesthesia Post Note  Patient: Hunter [name]  Procedure(s) Performed: RIGHT THIGH MUSCLE BIOPSY/RIGHT DELTOID MUSCLE BIOPSY (Right Arm Upper)     Patient location during evaluation: PACU Anesthesia Type: MAC Level of consciousness: awake and alert Pain management: pain level controlled Vital Signs Assessment: post-procedure vital signs reviewed and stable Respiratory status: spontaneous breathing, nonlabored ventilation, respiratory function stable and patient connected to nasal cannula oxygen Cardiovascular status: stable and blood pressure returned to baseline Postop Assessment: no apparent nausea or vomiting Anesthetic complications: no    Last Vitals:  Vitals:   05/03/17 1312 05/03/17 1315  BP:    Pulse: 72 71  Resp:    Temp:  36.6 C  SpO2: 100% 100%    Last Pain:  Vitals:   05/03/17 1309  TempSrc:   PainSc: 0-No pain                 Shawnelle Spoerl

## 2017-05-03 NOTE — Discharge Instructions (Signed)
[facility] Office Phone Number [phone]   POST OP INSTRUCTIONS  Always review your discharge instruction sheet given to you by the facility where your surgery was performed.  IF YOU HAVE DISABILITY OR FAMILY LEAVE FORMS, YOU MUST BRING THEM TO THE OFFICE FOR PROCESSING.  DO NOT GIVE THEM TO YOUR DOCTOR.  1. A prescription for pain medication may be given to you upon discharge.  Take your pain medication as prescribed, if needed.  If narcotic pain medicine is not needed, then you may take acetaminophen (Tylenol) or ibuprofen (Advil) as needed. 2. Take your usually prescribed medications unless otherwise directed 3. If you need a refill on your pain medication, please contact your pharmacy.  They will contact our office to request authorization.  Prescriptions will not be filled after 5pm or on week-ends. 4. You should eat very light the first 24 hours after surgery, such as soup, crackers, pudding, etc.  Resume your normal diet the day after surgery. 5. Most patients will experience some swelling and bruising around the surgical site.  Ice packs will help.  Swelling and bruising can take several days to resolve.  6. It is common to experience some constipation if taking pain medication after surgery.  Increasing fluid intake and taking a stool softener will usually help or prevent this problem from occurring.  A mild laxative (Milk of Magnesia or Miralax) should be taken according to package directions if there are no bowel movements after 48 hours. 7. You may remove your bandages 48 hours after surgery, and you may shower at that time.  You will have steri-strips (small skin tapes) in place directly over the incision.  These strips should be left on the skin for 7-10 days.   8. ACTIVITIES:  You may resume regular daily activities (gradually increasing) beginning the next day.   You may have sexual intercourse when it is comfortable. a. You may drive when you no longer are taking  prescription pain medication, you can comfortably wear a seatbelt, and you can safely maneuver your car and apply brakes. b. RETURN TO WORK:  1-2 weeks 9. You should see your doctor in the office for a follow-up appointment approximately two to three weeks after your surgery.    WHEN TO CALL YOUR DOCTOR: 1. Fever over 101.0 2. Nausea and/or vomiting. 3. Extreme swelling or bruising. 4. Continued bleeding from incision. 5. Increased pain, redness, or drainage from the incision.  The clinic staff is available to answer your questions during regular business hours.  Please don't hesitate to call and ask to speak to one of the nurses for clinical concerns.  If you have a medical emergency, go to the nearest emergency room or call 911.  A surgeon from [facility] is always on call at the hospital.  For further questions, please visit [email or web address]    

## 2017-05-03 NOTE — Transfer of Care (Signed)
Immediate Anesthesia Transfer of Care Note  Patient: Hunter [name]  Procedure(s) Performed: RIGHT THIGH MUSCLE BIOPSY/RIGHT DELTOID MUSCLE BIOPSY (Right Arm Upper)  Patient Location: PACU  Anesthesia Type:MAC  Level of Consciousness: awake, alert  and oriented  Airway & Oxygen Therapy: Patient Spontanous Breathing and Patient connected to nasal cannula oxygen  Post-op Assessment: Report given to RN and Post -op Vital signs reviewed and stable  Post vital signs: Reviewed and stable  Last Vitals:  Vitals Value Taken Time  BP    Temp    Pulse 77 05/03/2017  1:09 PM  Resp    SpO2 98 % 05/03/2017  1:09 PM  Vitals shown include unvalidated device data.  Last Pain:  Vitals:   05/03/17 1136  TempSrc: Oral  PainSc: 0-No pain      Patients Stated Pain Goal: 3 (30/05/11 0211)  Complications: No apparent anesthesia complications

## 2017-05-03 NOTE — Progress Notes (Signed)
Went over discharge instructions with patient and Hunter [name] (friend). Prescriptions were called over to patient's pharmacy, follow up appointment scheduled, no questions at this time. Patient and Hunter [name] both understood patient could not drive following surgery, both verbalized understanding. AVS signed by Hunter Boswortharlos.

## 2017-05-03 NOTE — H&P (Signed)
History of Present Illness  The patient is a 47 year old male who presents with a complaint of weakness.  Referred by Dr. Nita Sickleonika Patel and Joaquin CourtsKimberly Harris, FNP for muscle biopsy  This is a 47 year old male with multiple significant comorbidities who presents with recent onset of bilateral thigh muscle weakness. He is undergoing evaluation by neurology. They have requested muscle biopsy of his rectus femoris. When I review their notes, there is some mention of a biopsy of his deltoid muscle as well. However the patient states that he is having no problems with his upper extremities. We will contact Dr. Allena KatzPatel to clarify this. For now we will plan to do a thigh muscle biopsy.  The patient is quite unclear about the purpose of today's visit. He thought that we were going to do some sort of needle biopsy today and that it would be a quick in and out procedure. He does not understand that this is a surgical biopsy that would require anesthesia and an incision. He seems a bit reluctant to proceed with surgery initially.   Past Surgical History  No pertinent past surgical history   Allergies No Known Drug Allergies   Medication History Ventolin HFA (108 (90 Base)MCG/ACT Aerosol Soln, Inhalation) Active. MetFORMIN HCl (500MG  Tablet, Oral) Active. Torsemide (20MG  Tablet, Oral) Active. TraMADol HCl (50MG  Tablet, Oral) Active.  Social History  Alcohol use  Occasional alcohol use. Caffeine use  Coffee. No drug use  Tobacco use  Current some day smoker.  Family History Family history unknown  First Degree Relatives   Other Problems  Chronic Renal Failure Syndrome  Congestive Heart Failure  High blood pressure  Home Oxygen Use  Hypercholesterolemia  Sleep Apnea     Review of Systems  General Present- Fatigue. Not Present- Appetite Loss, Chills, Fever, Night Sweats, Weight Gain and Weight Loss. Skin Not Present- Change in Wart/Mole, Dryness, Hives,  Jaundice, New Lesions, Non-Healing Wounds, Rash and Ulcer. HEENT Not Present- Earache, Hearing Loss, Hoarseness, Nose Bleed, Oral Ulcers, Ringing in the Ears, Seasonal Allergies, Sinus Pain, Sore Throat, Visual Disturbances, Wears glasses/contact lenses and Yellow Eyes. Respiratory Not Present- Bloody sputum, Chronic Cough, Difficulty Breathing, Snoring and Wheezing. Breast Not Present- Breast Mass, Breast Pain, Nipple Discharge and Skin Changes. Cardiovascular Present- Shortness of Breath and Swelling of Extremities. Not Present- Chest Pain, Difficulty Breathing Lying Down, Leg Cramps, Palpitations and Rapid Heart Rate. Gastrointestinal Not Present- Abdominal Pain, Bloating, Bloody Stool, Change in Bowel Habits, Chronic diarrhea, Constipation, Difficulty Swallowing, Excessive gas, Gets full quickly at meals, Hemorrhoids, Indigestion, Nausea, Rectal Pain and Vomiting. Male Genitourinary Not Present- Blood in Urine, Change in Urinary Stream, Frequency, Impotence, Nocturia, Painful Urination, Urgency and Urine Leakage.  Vitals Weight: 286 lb Height: 71in Body Surface Area: 2.46 m Body Mass Index: 39.89 kg/m  Pulse: 88 (Regular)  BP: 120/80 (Sitting, Left Arm, Standard)       Physical Exam  The physical exam findings are as follows: Note:WDWN Obese male in NAD Left thigh - no incisions or masses anteriorly Non-tender    Assessment & Plan  PROXIMAL MUSCLE WEAKNESS (M62.81) Current Plans Schedule for Surgery - Left thigh muscle biopsy/ left deltoid muscle biopsy The surgical procedure has been discussed with the patient. Potential risks, benefits, alternative treatments, and expected outcomes have been explained. All of the patient's questions at this time have been answered. The likelihood of reaching the patient's treatment goal is good. The patient understand the proposed surgical procedure and wishes to proceed. Note:Dr. Patel's referral note  did not indicate  which side to biopsy.  However, there is a note attached to the EMG report that states "left thigh and deltoid muscle biopsy" so we will perform the procedure on that side.  The surgery was originally scheduled for the right, but we will make that change.  Wilmon Arms. Corliss Skains, MD, [facility]  General/ Trauma Surgery  05/03/2017 11:52 AM

## 2017-05-03 NOTE — Op Note (Signed)
Preop diagnosis: Proximal muscle weakness Postop diagnosis: Same Procedure performed: #1 left deltoid muscle biopsy 2.  Left quadriceps muscle biopsy Surgeon:Kaiyu Mirabal K Keyani Rigdon Anesthesia: Local MAC Indications:This is a 47 year old male with multiple significant comorbidities who presents with recent onset of bilateral thigh muscle weakness. He is undergoing evaluation by neurology. They have requested muscle biopsy of his rectus femoris as well as the left deltoid muscle.  Description of procedure: The patient is brought to the operating room and placed in the supine position on the operating room table.  His left arm was placed on an armboard by his side.  His left shoulder and his left anterior thigh were prepped with ChloraPrep and draped in sterile fashion.  A timeout was taken to ensure the proper patient and proper procedure.  He was given some intravenous sedation.  We outlined our incisions.  We infiltrated both incisions with 0.25% Marcaine with epinephrine.  Once we had adequate analgesia I began over the left deltoid.  I made a 3 cm incision and dissected down through the subcutaneous tissues with cautery.  We opened the sheath over the deltoid.  I excised a 2 cm segment of muscle measuring approximately 1 cm wide.  This was sent fresh for pathologic examination.  We inspect for hemostasis.  The fascia was loosely closed with 3-0 Vicryl.  3-0 Vicryl was used to close subcutaneous tissues and 4-0 Monocryl was used to close the skin.  We then turned our attention to the left thigh.  We made our incision and dissected down to the sheath overlying the muscle.  We opened the sheath sharply.  We dissected a 2 x 0.5 cm strip of muscle.  This was also sent for pathologic examination.  We closed the fascia with 3-0 Vicryl.  3-0 Vicryl was used to close the subcutaneous tissues and 4-0 Monocryl was used to close the skin.  Benzoin Steri-Strips were applied to both incisions.  Clean dressings were  applied.  The patient was brought to the recovery room in stable condition.  All sponge, instrument, and needle counts are correct.   Imogene Burn. Georgette Dover, MD, [facility]  General/ Trauma Surgery  05/03/2017 1:10 PM

## 2017-05-03 NOTE — Anesthesia Procedure Notes (Signed)
Procedure Name: MAC Date/Time: 05/03/2017 12:23 PM Performed by: Imagene Riches, CRNA Pre-anesthesia Checklist: Patient identified, Emergency Drugs available, Suction available and Patient being monitored Patient Re-evaluated:Patient Re-evaluated prior to induction Oxygen Delivery Method: Circle system utilized Preoxygenation: Pre-oxygenation with 100% oxygen Induction Type: IV induction

## 2017-05-04 ENCOUNTER — Encounter (HOSPITAL_COMMUNITY): Payer: Self-pay | Admitting: Surgery

## 2017-05-09 ENCOUNTER — Other Ambulatory Visit: Payer: Self-pay | Admitting: Family Medicine

## 2017-05-09 MED ORDER — TORSEMIDE 10 MG PO TABS: 20.0000 mg | ORAL_TABLET | Freq: Two times a day (BID) | ORAL | 1 refills | Status: DC

## 2017-05-09 MED FILL — traMADol HCL 50 MG TABS: 50 | 7 days supply | Qty: 14 | Fill #2

## 2017-05-09 MED FILL — TORSEMIDE 10 MG TABS: 10 | 30 days supply | Qty: 120 | Fill #0

## 2017-05-09 NOTE — Progress Notes (Signed)
[facility] pharmacy called to advise out of Torsemide 20 mg.  New prescription for Torsemide 10 mg (20 mg twice daily) sent to pharmacy.   Godfrey PickKimberly S. Tiburcio PeaHarris, MSN, FNP-C [facility]  [address] [place], [place] [address] [phone]

## 2017-05-10 ENCOUNTER — Telehealth: Payer: Self-pay | Admitting: Emergency Medicine

## 2017-05-10 DIAGNOSIS — R0902 Hypoxemia: Secondary | ICD-10-CM

## 2017-05-11 NOTE — Telephone Encounter (Signed)
Order placed to change dme companies. lmtcb X1 for pt to make aware.

## 2017-05-11 NOTE — Telephone Encounter (Signed)
Spoke with patient. He stated that he recently switched over to [facility] and is not happy with the company. Per the patient, he had asked [employer] prior to the switch if they could provide him with a POC that can produce its own air.   The switch has been completed and he is not happy with the POC they provided him.   He wants to know if he can switch to [employer] since he has been in touch with them as well and they can provide the POC that makes its own air.   RB, please advise if you are ok with him switching to [employer]. Thanks!

## 2017-05-11 NOTE — Telephone Encounter (Signed)
Okay to make this DME change.

## 2017-05-12 NOTE — Telephone Encounter (Signed)
lmtcb x2 for pt. 

## 2017-05-13 NOTE — Telephone Encounter (Signed)
Left voice mail on machine for patient to return phone call back regarding DME companies begining changed. X2

## 2017-05-16 NOTE — Telephone Encounter (Signed)
Per triage protocol will close encounter due to unsuccessful attempts to reach patient.

## 2017-05-17 ENCOUNTER — Encounter: Payer: Self-pay | Admitting: Physician Assistant

## 2017-05-17 DIAGNOSIS — G7249 Other inflammatory and immune myopathies, not elsewhere classified: Secondary | ICD-10-CM | POA: Insufficient documentation

## 2017-05-19 ENCOUNTER — Telehealth: Payer: Self-pay | Admitting: Emergency Medicine

## 2017-05-19 DIAGNOSIS — G4733 Obstructive sleep apnea (adult) (pediatric): Secondary | ICD-10-CM

## 2017-05-19 DIAGNOSIS — D860 Sarcoidosis of lung: Secondary | ICD-10-CM

## 2017-05-19 NOTE — Telephone Encounter (Signed)
Per previous message, pt was wanting DME to be switched to [employer], which was okayed to be done by RB.  Pt stated he was having an issue with [employer] being able to supply his POC needs and is now wanting to switch to [facility] instead.  Order will be placed for pt to receive POC through [facility].  Pt also stated to me he is needing supplies for BIPAP and [facility].  Will place an order to [facility] for pt to receive supplies.  Order placed to [facility] for O2 and order placed to [facility] for BIPAP supplies.  Nothing further needed at this time.

## 2017-05-20 ENCOUNTER — Telehealth (HOSPITAL_COMMUNITY): Payer: Self-pay

## 2017-05-20 NOTE — Telephone Encounter (Signed)
I called Mr Hunter [name] to check in and see if he was avaiable for an appointment. He said he was feeling well and had just been at a doctors appointment yesterday. I asked if he felt like he needed an appointment today and he said that he did not think so. I advised we would get together next week.

## 2017-05-23 ENCOUNTER — Ambulatory Visit: Payer: Medicaid Other | Admitting: Neurology

## 2017-05-23 ENCOUNTER — Other Ambulatory Visit: Payer: Self-pay

## 2017-05-23 ENCOUNTER — Encounter: Payer: Self-pay | Admitting: Neurology

## 2017-05-23 VITALS — BP 144/86 | HR 82 | Ht 72.0 in | Wt 264.0 lb

## 2017-05-23 DIAGNOSIS — D869 Sarcoidosis, unspecified: Secondary | ICD-10-CM

## 2017-05-23 DIAGNOSIS — G7249 Other inflammatory and immune myopathies, not elsewhere classified: Secondary | ICD-10-CM | POA: Diagnosis not present

## 2017-05-23 NOTE — Patient Instructions (Signed)
I will call you with more information after your biopsy results are finalized

## 2017-05-23 NOTE — Progress Notes (Signed)
Follow-up Visit   Date: 05/23/17   Hunter [name] MRN: [id number] DOB: 1970/02/23   Interim History: Hunter [name] is a 47 y.o. 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  The patient was accompanied to the clinic by self.  History of present illness: He began having weakness of the leg since around 2016 with difficulty climbing stairs.  He used to have a 10-minute walk into his workplace from his parking lot.  Because of his shortness of breath/fatigue, he would have to come an hour before work to allow for frequent breaks.  He did not have to sit down and did not feel weakness of the legs, but would stand for a few minutes and then walk again.  He used to drive a forklift and reports having difficulty and weakness of the legs with getting in and out of it.  He stopped working on November 02, 2016 and is applying for disability.  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  He is not on any immunotherapy at this time. Steroid-induced myopathy was raised as a possibility by his nephrologist. Despite stopping these medications, he has not had any improvement in lower leg strength.  He was hospitalized in October for acute on chronic heart failure and volume overload.  Following this, he started using crutches because of gout in his left foot and generalized weakness.  Prior to this, he was walking unassisted.  He has not had any falls.  He denies any pain the in legs, cramps, twitches, and back pain.   He has some arm weakness and stiffness of the shoulder with reaching for objects.  No numbness/tingling of the extremities.  He drinks about 30oz aobut 1-2 times per week.   He stopped smoking in October 2018.   UPDATE 05/23/2017:  He is here for follow-up visit. He had NCS/EMG of the right side which showed an irritable myopathy and subsequently underwent muscle biopsy of the left deltoid and quadriceps muscles showing lymphohistiocytic inflammatory myopathy.  The sample has been sent for further staining.  He continues to have bilateral leg weakness and difficulty stepping up onto steps as well as getting up from low chairs.  He has some upper arm weakness with reaching for objects.  No interval falls.  He walks unassisted.  No problems with swallowing, talking, cramps, numbness/tingling, or muscle pain.  He has shortness of breath and is on oxygen.   Medications:  Current Outpatient Medications on File Prior to Visit  Medication Sig Dispense Refill  . albuterol (PROVENTIL HFA;VENTOLIN HFA) 108 (90 Base) MCG/ACT inhaler Inhale 2 puffs into the lungs every 6 (six) hours as needed for wheezing or shortness of breath. 1 Inhaler 5  . ciclopirox (PENLAC) 8 % solution APPLY TOPICALLY AT BEDTIME OVER NAIL AND SURROUNDING SKIN. APPLY DAILY OVER PREVIOUS COAT. AFTER 7 DAYS, MAY REMOVE WITH ALCOHOL AND CONTINU 6.6 mL 0  . diclofenac sodium (VOLTAREN) 1 % GEL APPLY 2 GRAMS TO FOOT (AND AFFECTED AREAS) 2-4 TIMES DAILY. 100 g 0  . HYDROcodone-acetaminophen (NORCO/VICODIN) 5-325 MG tablet Take 1 tablet by mouth every 6 (six) hours as needed for moderate pain. 20 tablet 0  . magnesium oxide (MAG-OX) 400 (241.3 Mg) MG tablet Take 1 tablet (400 mg  total) every evening by mouth. 90 tablet 2  . metFORMIN (GLUCOPHAGE) 500 MG tablet Take 1 tablet (500 mg total) by mouth 2 (two) times daily with a meal. 180 tablet 3  . torsemide (DEMADEX) 10 MG tablet Take 2 tablets (20 mg total) by mouth 2 (two) times daily. 360 tablet 1  . traMADol (ULTRAM) 50 MG tablet Take 50 mg by mouth 2 (two) times daily as needed.  1   No current facility-administered medications on file prior to visit.      Allergies: No Active Allergies  Review of Systems:  CONSTITUTIONAL: No fevers, chills, night sweats, or weight loss.  EYES: No visual changes or eye pain ENT: No hearing changes.  No history of nose bleeds.   RESPIRATORY: No cough, wheezing + shortness of breath.   CARDIOVASCULAR: Negative for chest pain, and palpitations.   GI: Negative for abdominal discomfort, blood in stools or black stools.  No recent change in bowel habits.   GU:  No history of incontinence.   MUSCLOSKELETAL: No history of joint pain or swelling.  No myalgias.   SKIN: Negative for lesions, rash, and itching.   ENDOCRINE: Negative for cold or heat intolerance, polydipsia or goiter.   PSYCH:  No depression or anxiety symptoms.   NEURO: As Above.   Vital Signs:  BP (!) 144/86   Pulse 82   Ht 6' (1.829 m)   Wt 264 lb (119.7 kg)   SpO2 93%   BMI 35.80 kg/m  Pain Scale: 0 on a scale of 0-10   General: Well appearing, comfortable, on oxygen via [place]  Neurological Exam: MENTAL STATUS including orientation to time, place, person, recent and remote memory, attention span and concentration, language, and fund of knowledge is normal.  Speech is not dysarthric.  CRANIAL NERVES: No visual field defects.  Pupils equal round and reactive to light.  Normal conjugate, extra-ocular eye movements in all directions of gaze.  No ptosis.  Face is symmetric. Palate elevates symmetrically.  Tongue is midline.   MOTOR:  No atrophy, fasciculations or abnormal movements.  No pronator drift.  Tone is normal.    Right Upper Extremity:    Left Upper Extremity:    Deltoid  5/5   Deltoid  5/5   Biceps  5/5   Biceps  5/5   Triceps  5/5   Triceps  4+/5   Wrist extensors  5/5   Wrist extensors  5/5   Wrist flexors  5/5   Wrist flexors  5/5   Finger extensors  5/5   Finger extensors  5/5   Finger flexors  5/5   Finger flexors  5/5   Dorsal interossei  5/5   Dorsal interossei  5/5   Abductor pollicis  5/5   Abductor pollicis  5/5    Tone (Ashworth scale)  0  Tone (Ashworth scale)  0   Right Lower Extremity:    Left Lower Extremity:    Hip flexors  4+/5   Hip flexors  4+/5   Hip extensors  5/5   Hip extensors  5/5   Adductor 4/5  Adductor 4/5  Abductor 5/5  Abductor 5/5  Knee flexors  5/5   Knee flexors  5/5   Knee extensors  5/5   Knee extensors  5/5   Dorsiflexors  5/5   Dorsiflexors  5/5   Plantarflexors  5/5   Plantarflexors  5/5   Toe extensors  5/5   Toe extensors  5/5   Toe  flexors  5/5   Toe flexors  5/5   Tone (Ashworth scale)  0  Tone (Ashworth scale)  0   MSRs:  Reflexes are 2+/4 in the upper extremities and absent in the legs  SENSORY:  Intact to vibration throughout.  COORDINATION/GAIT:  Gait appears wide-based, waddling, unassisted and stable.   Data: CT chest 11/12/2016:   1. Overall, the appearance of the chest appears very similar to prior study 11/25/2014, and given the patient's history these findings are likely reflective of chronic sarcoidosis. No new acute findings are noted. 2. Severe dilatation of the pulmonic trunk and main pulmonary  arteries, suggestive of underlying pulmonary arterial hypertension. 3. Cardiomegaly. 4. Additional incidental findings, as above.  NCS/EMG of the right arm and leg 01/20/2017: 1. 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 2. Incidentally, there is a right median neuropathy at or distal to the wrist, consistent with the clinical diagnosis of carpal tunnel syndrome. Overall, these findings are moderate in degree electrically. 3. There is no evidence of a sensorimotor neuropathy or cervical/lumbosacral radiculopathy.   Labs 01/10/2017:  CK 670*, myostitis panel III neg, MG panel neg, aldolase 9.0  Left deltoid muscle biopsy 05/03/2017:  The findings suggest an ongoing inflammatory process. The inflammation is not necrotizing granulomatous but the histiocytic collections warrant special stains for  organisms. Findings of the special stains will be reported in an addendum when available. An autoimmune myositis or sarcoidosis could also be considered. Clinical correlation is essential.  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  Myositis panel looking for other autoimmune antibodies was negative.  Finalized muscle biopsy report is pending.  I had lengthy discussion with patient regarding his myositis and given the inflammatory findings, corticosteroid would be the recommended treatment initially.  He is very reluctant to go back onto prednisone because of long term side effects.  Once his biopsy results are finalized, I will discuss with Dr. Delton CoombesByrum, his pulmonologist, to strategize a treatment plan.    The duration of this appointment visit was 25 minutes of face-to-face time with the patient.  Greater than 50% of this time was spent in counseling, explanation of diagnosis, planning of further management, and coordination of care.   Thank you for allowing me to participate in patient's care.  If I can answer any additional questions, I would be pleased to do so.    Sincerely,    Donika K. Allena KatzPatel, DO

## 2017-06-01 ENCOUNTER — Encounter (HOSPITAL_COMMUNITY): Payer: Self-pay | Admitting: Surgery

## 2017-06-02 ENCOUNTER — Other Ambulatory Visit (HOSPITAL_COMMUNITY): Payer: Self-pay

## 2017-06-02 NOTE — Progress Notes (Signed)
Paramedicine Encounter    Patient ID: Hunter [name], male    DOB: 1970/10/16, 47 y.o.   MRN: [id number]   Patient Care Team: Hunter Neighbors, [name] as PCP - General (Family Medicine)  Patient Active Problem List   Diagnosis Date Noted  . 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 Chronic diastolic CHF (congestive heart failure) (HCC)   . Acute right-sided CHF (congestive heart failure) (HCC)   . Pleural effusion, right   . Acute renal failure with acute tubular necrosis superimposed on stage 3 chronic kidney disease (HCC)   . Prediabetes   . Sarcoidosis of lung (HCC)   . Generalized edema   . Orthostatic hypotension 11/14/2016  . RVF (right ventricular failure) (HCC)   . Pressure injury of skin 11/09/2016  . Acute on chronic respiratory failure with hypoxia and hypercapnia (HCC)   . Pulmonary edema 11/06/2016  . Restrictive lung disease secondary to obesity 11/06/2016  . Obesity hypoventilation syndrome (HCC) 11/06/2016  . OSA (obstructive sleep apnea) 11/06/2016  . CHF (congestive heart failure) (HCC) 11/05/2016  . HTN (hypertension) 11/02/2016  . Hypersomnia 03/02/2016  . Sarcoidosis 02/18/2016  . Hypoxemia 02/18/2016  . Morbid obesity (HCC) 02/18/2016    Current Outpatient Medications:  .  albuterol (PROVENTIL HFA;VENTOLIN HFA) 108 (90 Base) MCG/ACT inhaler, Inhale 2 puffs into the lungs every 6 (six) hours as needed for wheezing or shortness of breath., Disp: 1 Inhaler, Rfl: 5 .  ciclopirox (PENLAC) 8 % solution, APPLY TOPICALLY AT BEDTIME OVER NAIL AND SURROUNDING SKIN. APPLY DAILY OVER PREVIOUS COAT. AFTER 7 DAYS, MAY REMOVE WITH ALCOHOL AND CONTINU, Disp: 6.6 mL, Rfl: 0 .  diclofenac sodium (VOLTAREN) 1 % GEL, APPLY 2 GRAMS TO FOOT (AND AFFECTED AREAS) 2-4 TIMES DAILY., Disp: 100 g, Rfl: 0 .  magnesium oxide (MAG-OX) 400 (241.3 Mg) MG tablet, Take 1 tablet (400 mg total) every evening by mouth., Disp: 90 tablet, Rfl:  2 .  metFORMIN (GLUCOPHAGE) 500 MG tablet, Take 1 tablet (500 mg total) by mouth 2 (two) times daily with a meal., Disp: 180 tablet, Rfl: 3 .  torsemide (DEMADEX) 10 MG tablet, Take 2 tablets (20 mg total) by mouth 2 (two) times daily., Disp: 360 tablet, Rfl: 1 .  traMADol (ULTRAM) 50 MG tablet, Take 50 mg by mouth 2 (two) times daily as needed., Disp: , Rfl: 1 .  HYDROcodone-acetaminophen (NORCO/VICODIN) 5-325 MG tablet, Take 1 tablet by mouth every 6 (six) hours as needed for moderate pain. (Patient not taking: Reported on 06/02/2017), Disp: 20 tablet, Rfl: 0 No Active Allergies    Social History   Socioeconomic History  . Marital status: Single    Spouse name: Not on file  . Number of children: 0  . Years of education: 42  . Highest education level: Not on file  Occupational History  . Not on file  Social Needs  . Financial resource strain: Not on file  . Food insecurity:    Worry: Not on file    Inability: Not on file  . Transportation needs:    Medical: Not on file    Non-medical: Not on file  Tobacco Use  . Smoking status: Current Some Day Smoker    Packs/day: 0.10    Years: 10.00    Pack years: 1.00    Types: Cigarettes  . Smokeless tobacco: Never Used  Substance and Sexual Activity  . Alcohol use: Yes    Alcohol/week: 1.8 oz    Types:  3 Cans of beer per week  . Drug use: No  . Sexual activity: Not on file  Lifestyle  . Physical activity:    Days per week: Not on file    Minutes per session: Not on file  . Stress: Not on file  Relationships  . Social connections:    Talks on phone: Not on file    Gets together: Not on file    Attends religious service: Not on file    Active member of club or organization: Not on file    Attends meetings of clubs or organizations: Not on file    Relationship status: Not on file  . Intimate partner violence:    Fear of current or ex partner: Not on file    Emotionally abused: Not on file    Physically abused: Not on file     Forced sexual activity: Not on file  Other Topics Concern  . Not on file  Social History Narrative   Lives alone.  Currently homeless and staying on the 3rd floor of a hotel with an elevator.     No children.  Education: high school.      Physical Exam  Constitutional: He is oriented to person, place, and time.  Cardiovascular: Normal rate and regular rhythm.  Pulmonary/Chest: Effort normal and breath sounds normal.  Abdominal: Soft.  Musculoskeletal: Normal range of motion. He exhibits no edema.  Neurological: He is alert and oriented to person, place, and time.  Skin: Skin is warm and dry.  Psychiatric: He has a normal mood and affect.        Future Appointments  Date Time Provider Department Center  06/10/2017  1:15 PM Hunter [name]  07/25/2017 11:00 AM Hunter [name] [facility] None    BP 138/90 (BP Location: Right Arm, Patient Position: Sitting, Cuff Size: Large)   Pulse 91   Resp 18   Wt 258 lb (117 kg)   SpO2 95%   BMI 34.99 kg/m   Weight yesterday- 262 lb Last visit weight- 258 lb  Hunter [name] was seen at home today and reported feeling well. He stated he has been compliant with his medications and despite a few days over the last month, his weight has been stable. [facility] and he was agreeable. I contacted [name] and the clinic to let her know to remove him from the paramedicine list.   Time spent with patient: 25 minutes  Hunter [name], EMT 06/02/17  ACTION: Home visit completed Next visit planned for 1 week

## 2017-06-06 MED FILL — metFORMIN HCL 500 MG TABS: 500 | 30 days supply | Qty: 60 | Fill #6

## 2017-06-07 ENCOUNTER — Telehealth: Payer: Self-pay | Admitting: Neurology

## 2017-06-07 ENCOUNTER — Telehealth (HOSPITAL_COMMUNITY): Payer: Self-pay | Admitting: Surgery

## 2017-06-07 NOTE — Telephone Encounter (Signed)
Called and informed patient that his muscle biopsy results is consistent with an inflammatory myopathy, as seen with sarcoid myopathy.  I have asked him to follow up with his pulmonologist, Dr. Delton Coombes, to discuss management options for sarcoidosis.  Briefly, I discussed prednisone for inflammatory myopathies, but he is not keen on taking this again as it took him a long time to taper this in the past.   Hunter Pasquarella K. Allena Katz, DO

## 2017-06-07 NOTE — Telephone Encounter (Signed)
Mr. Debroah Baller will be discharged from [facility] at this time secondary to successful completion.

## 2017-06-09 NOTE — Telephone Encounter (Signed)
Need to get him set up for an OV to discuss possible treatment/.

## 2017-06-10 ENCOUNTER — Ambulatory Visit: Payer: Medicaid Other | Admitting: Podiatry

## 2017-06-13 ENCOUNTER — Ambulatory Visit: Payer: Medicaid Other | Admitting: Podiatry

## 2017-06-13 DIAGNOSIS — B351 Tinea unguium: Secondary | ICD-10-CM | POA: Diagnosis not present

## 2017-06-13 DIAGNOSIS — M79675 Pain in left toe(s): Principal | ICD-10-CM

## 2017-06-13 DIAGNOSIS — M79674 Pain in right toe(s): Secondary | ICD-10-CM

## 2017-06-13 DIAGNOSIS — M779 Enthesopathy, unspecified: Secondary | ICD-10-CM

## 2017-06-13 DIAGNOSIS — R29898 Other symptoms and signs involving the musculoskeletal system: Secondary | ICD-10-CM

## 2017-06-13 DIAGNOSIS — M7751 Other enthesopathy of right foot: Secondary | ICD-10-CM

## 2017-06-13 DIAGNOSIS — M79676 Pain in unspecified toe(s): Secondary | ICD-10-CM | POA: Diagnosis not present

## 2017-06-13 NOTE — Patient Instructions (Signed)
On the right orthotic I think the arch needs to be contoured more to better fit. If you have any questions, please give me a call at [phone]

## 2017-06-15 NOTE — Progress Notes (Signed)
Subjective: 47 year old male presents the office today to have orthotics evaluated and he had made by an outside company, [employer]. He was managed with your fitting appropriately.  He is also had his nails be trimmed as they are thick and elongated causing irritation with shoes.  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  He has been under the care of neurology as well as pulmonology for this. Denies any systemic complaints such as fevers, chills, nausea, vomiting. No acute changes since last appointment, and no other complaints at this time.   Objective: AAO x3, NAD DP/PT pulses palpable bilaterally, CRT less than 3 seconds Overall there is no area pinpoint tenderness or pain to vibratory sensation bilateral lower extremities and there is no overlying edema, erythema, increase in warmth.  Overall he is still concerned about weakness to his legs he is under the care of neurology for this.  Upon evaluation of his orthotics the left foot appears to be fitting well however the right side I am concerned that the arch needs to be brought more distal. Nails are hypertrophic, dystrophic, brittle, discolored, elongated 10. No surrounding redness or drainage. Tenderness nails 1-5 bilaterally. No open lesions or pre-ulcerative lesions are identified today. No pain with calf compression, swelling, warmth, erythema  Assessment: Bilateral foot pain with orthotics; symptomatic onychomycosis  Plan: -All treatment options discussed with the patient including all alternatives, risks, complications.  -Upon evaluation of his orthotic to the right side used to be adjusted to the note was written for this.  I will have him evaluated by the orthotist that may be orthotics. -I debrided the nails today without any complications or bleeding x10. -You to follow-up with neurology as well as pulmonology for sarcoid as this is likely contributing to  leg weakness. -Patient encouraged to call the office with any questions, concerns, change in symptoms.   Vivi Barrack DPM

## 2017-06-20 ENCOUNTER — Telehealth: Payer: Self-pay | Admitting: Podiatry

## 2017-06-20 NOTE — Telephone Encounter (Signed)
Pt called and has contacted [employer] where he had his orthotics made and they are needing a rx and a detailed note telling them what needs to be done to correct the patients orthotics that you had evaluated and said they were not exactly right. If we could fax this to [employer] and let the pt know he will call them and make an appt with them..There fax # is [phone]

## 2017-06-22 NOTE — Telephone Encounter (Signed)
rx given to Hunter [name] 

## 2017-06-23 NOTE — Telephone Encounter (Signed)
Faxed Rx to [employer] on 5.15.19 Left message for pt that it was done and that Dr Ardelle Anton did state that on his last office visit AV[facility] he did write what he felt was needed to be done to his orthotics by [employer] in detail.

## 2017-06-27 MED FILL — TORSEMIDE 20 MG TABLET: 20 | 30 days supply | Qty: 60 | Fill #1 | Status: TO

## 2017-06-27 MED FILL — PROAIR HFA 90 MCG INHALER: 108 (90 BAS | 25 days supply | Qty: 9 | Fill #1 | Status: TO

## 2017-06-28 NOTE — Telephone Encounter (Signed)
Pt called back and said that [employer] states they did not get the fax that I sent them.  I got confirmation that it was sent on 5.15.19 and resent it today per pts request.

## 2017-07-08 ENCOUNTER — Telehealth: Payer: Self-pay | Admitting: Neurology

## 2017-07-08 NOTE — Telephone Encounter (Signed)
Please call.

## 2017-07-08 NOTE — Telephone Encounter (Signed)
*  STAT* If patient is at the pharmacy, call can be transferred to refill team.  1.     Which medications need to be refilled? (please list name of each medication and dose if know) Hydrocodone  2.     Which pharmacy/location (including street and city if local pharmacy) is medication to be sent to? [facility]  3.     Do they need a 30 or 90 day supply? 90

## 2017-07-08 NOTE — Telephone Encounter (Signed)
Returned call personally to patient and informed him that our office is a non-opiate prescribing practice.  Further, this pain medication was given for post-op pain.  If pain is persistent, he can be referred to pain management or talk to his PCP, if they are willing to manage his pain.   He has not seen Dr. Byrum for his saDelton Coombesoidosis and myopathy.  Again, I discussed using prednisone to control the disease process, but he would like to avoid this and will discuss with Dr. Delton Coombes alternative options.   Donika K. Allena Katz, DO

## 2017-07-12 NOTE — Telephone Encounter (Signed)
I spoke with Hunter [name] in pulmonology and she said that patient was last seen in March and was due for a follow up visit in June.  When they called him to try and schedule the June appointment, he said that he would call them back when he was ready to schedule his follow up.

## 2017-07-21 ENCOUNTER — Telehealth: Payer: Self-pay | Admitting: Emergency Medicine

## 2017-07-21 ENCOUNTER — Telehealth (HOSPITAL_COMMUNITY): Payer: Self-pay | Admitting: Cardiology

## 2017-07-21 ENCOUNTER — Telehealth: Payer: Self-pay | Admitting: Neurology

## 2017-07-21 NOTE — Telephone Encounter (Signed)
I spoke with Dr Allena KatzPatel regarding patient's status, his muscle bx > consistent with a sarcoid myopathy. Patient will need to be treated, has been averse to restarting prednisone, but this is likely the initial drug of choice. If refractory then could consider MTX or azathioprine. I would not start with these. He wants us to manage this since we have taken care of his sarcoid. Dr Allena KatzPatel will need to also see him to assess any improvement in his sx on therapy.   Please set him for OV with me to discuss. OK to overbook if needed. Thanks.

## 2017-07-21 NOTE — Telephone Encounter (Signed)
Patient called to report his neurologist has been attempting to speak directly to Dr Shirlee LatchMcLean, however she has not received a returned call.   Reports they need to speak to each other regarding medication options for treat his legs.  Recent leg biopsy done and medication needed for treatment requires ok from all specialist   Advised would follow up with Dr Allena KatzPatel and be sure Dr Shirlee LatchMcLean is aware prior to his follow up  Dr Eliane DecreePatel's office is unaware what patient is referring to advised to please feel free to reach out to our office if they ever need to speak directly to Dr Shirlee LatchMcLean and we would be sure to get him on the phone

## 2017-07-21 NOTE — Telephone Encounter (Signed)
Discussed with Dr. Delton CoombesByrum regarding patient's evaluation for leg weakness and muscle biopsy findings suggesting inflammatory myopathy, most likely sarcoid.    With his myositis panel for autoimmune disease being negative and he has no evidence to support medication-induced myopathy (not on steroids or statin therapy), findings support sarcoid myopathy.  Sarcoid myopathy is uncommon manifestation of the disease, but goal will be to treat underlying sarcoid which I greatly appreciate the guidance from Dr. Delton CoombesByrum.  We agree that initial therapy should be with prednisone and consider steroid-sparing agent, if needed.  I have informed the patient regarding the above.  He will follow-up with Dr. Delton CoombesByrum for medication management and see me August to follow his response to therapy.  As per patient's request, this note will be routed to his cardiology, Dr. Shirlee LatchMcLean, as Lorain ChildesFYI.  Donika K. Allena KatzPatel, DO

## 2017-07-21 NOTE — Telephone Encounter (Signed)
Please advise 

## 2017-07-22 ENCOUNTER — Telehealth: Payer: Self-pay | Admitting: Emergency Medicine

## 2017-07-22 ENCOUNTER — Encounter: Payer: Self-pay | Admitting: Family Medicine

## 2017-07-22 ENCOUNTER — Encounter (HOSPITAL_COMMUNITY): Payer: Self-pay

## 2017-07-22 NOTE — Telephone Encounter (Signed)
Spoke with pt. He is needing a letter from Dr. Delton CoombesByrum. The letter needs to state due to his medical conditions he can't look for work during the daytime. I asked him why this was the case. He stated that he can't walk more than 3810ft before his legs give out and become weak. This is only reason that he provided me.  Dr. Delton CoombesByrum - please advise. Thanks.

## 2017-07-22 NOTE — Telephone Encounter (Signed)
Spoke with pt. He has been scheduled to see Dr. Delton CoombesByrum on 08/02/17 at 4:30pm. Nothing further was needed.

## 2017-07-22 NOTE — Telephone Encounter (Signed)
Attempted to call pt. I did not receive an answer. I have left a message for pt to return our call.  

## 2017-07-25 ENCOUNTER — Encounter: Payer: Self-pay | Admitting: Family Medicine

## 2017-07-25 ENCOUNTER — Ambulatory Visit: Payer: Self-pay | Admitting: Family Medicine

## 2017-07-25 ENCOUNTER — Ambulatory Visit (INDEPENDENT_AMBULATORY_CARE_PROVIDER_SITE_OTHER): Payer: Medicaid Other | Admitting: Family Medicine

## 2017-07-25 VITALS — BP 132/72 | HR 84 | Temp 97.8°F | Ht 72.0 in | Wt 262.0 lb

## 2017-07-25 DIAGNOSIS — R609 Edema, unspecified: Secondary | ICD-10-CM

## 2017-07-25 DIAGNOSIS — R05 Cough: Secondary | ICD-10-CM | POA: Diagnosis not present

## 2017-07-25 DIAGNOSIS — Z09 Encounter for follow-up examination after completed treatment for conditions other than malignant neoplasm: Secondary | ICD-10-CM

## 2017-07-25 DIAGNOSIS — R0602 Shortness of breath: Secondary | ICD-10-CM | POA: Diagnosis not present

## 2017-07-25 DIAGNOSIS — F418 Other specified anxiety disorders: Secondary | ICD-10-CM

## 2017-07-25 DIAGNOSIS — R059 Cough, unspecified: Secondary | ICD-10-CM

## 2017-07-25 DIAGNOSIS — R7303 Prediabetes: Secondary | ICD-10-CM | POA: Diagnosis not present

## 2017-07-25 DIAGNOSIS — J45909 Unspecified asthma, uncomplicated: Secondary | ICD-10-CM

## 2017-07-25 DIAGNOSIS — R600 Localized edema: Secondary | ICD-10-CM

## 2017-07-25 DIAGNOSIS — D860 Sarcoidosis of lung: Secondary | ICD-10-CM | POA: Diagnosis not present

## 2017-07-25 DIAGNOSIS — M79604 Pain in right leg: Secondary | ICD-10-CM | POA: Diagnosis not present

## 2017-07-25 DIAGNOSIS — M79605 Pain in left leg: Secondary | ICD-10-CM

## 2017-07-25 LAB — POCT URINALYSIS DIP (MANUAL ENTRY)
Bilirubin, UA: NEGATIVE
Glucose, UA: NEGATIVE mg/dL
Ketones, POC UA: NEGATIVE mg/dL
Leukocytes, UA: NEGATIVE
Nitrite, UA: NEGATIVE
Protein Ur, POC: 100 mg/dL — AB
Spec Grav, UA: 1.015 (ref 1.010–1.025)
Urobilinogen, UA: 0.2 E.U./dL
pH, UA: 5 (ref 5.0–8.0)

## 2017-07-25 LAB — POCT GLYCOSYLATED HEMOGLOBIN (HGB A1C): Hemoglobin A1C: 5.3 % (ref 4.0–5.6)

## 2017-07-25 MED ORDER — TRAMADOL HCL 50 MG PO TABS: 50.0000 mg | ORAL_TABLET | Freq: Two times a day (BID) | ORAL | 1 refills | Status: DC | PRN

## 2017-07-25 NOTE — Progress Notes (Signed)
Subjective:    Patient ID: Hunter [name], male    DOB: 1970/02/19, 47 y.o.   MRN: [id number]   PCP: Raliegh IpNatalie Arelene Moroni, NP  Chief Complaint  Patient presents with  . Follow-up    6 month on chronic condition     HPI  Hunter [name] has a history of Sleep Apnea, Sarcoid, Hypertension, Dyspnea, CHF and Arthritis. She is here today for follow up.   Current Status: He is doing well with no complaints. He denies fevers, chills, fatigue, recent infections, weight loss, and night sweats. He does have occasional dizziness. He has not had any headaches, visual changes, and falls. No chest pain, heart palpitations,but reports cough and shortness of breath. He has sleep apnea and uses CPAP nightly.   He has occasional lower left quadrant pain of abdomen. No reports of any other GI problems. He has no reports of blood in stools, dysuria and hematuria.   He does have anxiety r/t his health problems.   He reports pain in his lower legs and feet today.   He continues to see Pulmonology for Sarcoidosis and Asthma.  He is to follow up with Nephrology soon.  He has follow up appointment with Neurologist 09/2017.   Past Medical History:  Diagnosis Date  . Arthritis    feet  . CHF (congestive heart failure) (HCC)   . Dyspnea   . Hypertension   . Nephrotic syndrome   . Pre-diabetes   . Sarcoid   . Sleep apnea     Family History  Problem Relation Age of Onset  . Asthma Sister   . Hypertension Paternal Grandmother     Social History   Socioeconomic History  . Marital status: Single    Spouse name: Not on file  . Number of children: 0  . Years of education: 1112  . Highest education level: Not on file  Occupational History  . Not on file  Social Needs  . Financial resource strain: Not on file  . Food insecurity:    Worry: Not on file    Inability: Not on file  . Transportation needs:    Medical: Not on file    Non-medical: Not on file  Tobacco Use  . Smoking status: Current Some Day  Smoker    Packs/day: 0.10    Years: 10.00    Pack years: 1.00    Types: Cigarettes  . Smokeless tobacco: Never Used  Substance and Sexual Activity  . Alcohol use: Yes    Alcohol/week: 1.8 oz    Types: 3 Cans of beer per week  . Drug use: No  . Sexual activity: Not on file  Lifestyle  . Physical activity:    Days per week: Not on file    Minutes per session: Not on file  . Stress: Not on file  Relationships  . Social connections:    Talks on phone: Not on file    Gets together: Not on file    Attends religious service: Not on file    Active member of club or organization: Not on file    Attends meetings of clubs or organizations: Not on file    Relationship status: Not on file  . Intimate partner violence:    Fear of current or ex partner: Not on file    Emotionally abused: Not on file    Physically abused: Not on file    Forced sexual activity: Not on file  Other Topics Concern  . Not on file  Social History Narrative   Lives alone.  Currently homeless and staying on the 3rd floor of a hotel with an elevator.     No children.  Education: high school.      Past Surgical History:  Procedure Laterality Date  . MUSCLE BIOPSY Right 05/03/2017   Procedure: RIGHT THIGH MUSCLE BIOPSY/RIGHT DELTOID MUSCLE BIOPSY;  Surgeon: Manus Rudd, MD;  Location: [facility] OR;  Service: General;  Laterality: Right;  . RIGHT HEART CATH N/A 11/11/2016   Procedure: RIGHT HEART CATH;  Surgeon: Laurey Morale, MD;  Location: [facility] INVASIVE CV LAB;  Service: Cardiovascular;  Laterality: N/A;   Immunization History  Administered Date(s) Administered  . PPD Test 03/21/2006  . Pneumococcal Polysaccharide-23 03/21/2006    Current Meds  Medication Sig  . albuterol (PROVENTIL HFA;VENTOLIN HFA) 108 (90 Base) MCG/ACT inhaler Inhale 2 puffs into the lungs every 6 (six) hours as needed for wheezing or shortness of breath.  . ciclopirox (PENLAC) 8 % solution APPLY TOPICALLY AT BEDTIME OVER NAIL AND  SURROUNDING SKIN. APPLY DAILY OVER PREVIOUS COAT. AFTER 7 DAYS, MAY REMOVE WITH ALCOHOL AND CONTINU  . diclofenac sodium (VOLTAREN) 1 % GEL APPLY 2 GRAMS TO FOOT (AND AFFECTED AREAS) 2-4 TIMES DAILY.  . magnesium oxide (MAG-OX) 400 (241.3 Mg) MG tablet Take 1 tablet (400 mg total) every evening by mouth.  . metFORMIN (GLUCOPHAGE) 500 MG tablet Take 1 tablet (500 mg total) by mouth 2 (two) times daily with a meal.  . torsemide (DEMADEX) 10 MG tablet Take 2 tablets (20 mg total) by mouth 2 (two) times daily.  . traMADol (ULTRAM) 50 MG tablet Take 1 tablet (50 mg total) by mouth 2 (two) times daily as needed.  . [DISCONTINUED] traMADol (ULTRAM) 50 MG tablet Take 50 mg by mouth 2 (two) times daily as needed.    No Active Allergies  BP 132/72 (BP Location: Left Arm, Patient Position: Sitting, Cuff Size: Large)   Pulse 84   Temp 97.8 F (36.6 C) (Oral)   Ht 6' (1.829 m)   Wt 262 lb (118.8 kg)   SpO2 97%   BMI 35.53 kg/m   Review of Systems  Constitutional: Negative.   HENT: Negative.   Eyes: Negative.   Respiratory: Positive for cough and shortness of breath.   Cardiovascular: Negative.   Gastrointestinal: Positive for abdominal pain (occasional in left lower quadrant).  Endocrine: Negative.   Genitourinary: Negative.   Musculoskeletal: Positive for arthralgias (and swelling in both feet).  Skin: Negative.   Allergic/Immunologic: Negative.   Neurological: Positive for dizziness (occasional ).  Hematological: Negative.   Psychiatric/Behavioral: Positive for sleep disturbance (sleep apnea/ CPAP).   Objective:   Physical Exam  Constitutional: He is oriented to person, place, and time. He appears well-developed and well-nourished.  HENT:  Head: Normocephalic and atraumatic.  Right Ear: External ear normal.  Left Ear: External ear normal.  Nose: Nose normal.  Mouth/Throat: Oropharynx is clear and moist.  Eyes: Pupils are equal, round, and reactive to light. Conjunctivae and EOM are  normal.  Neck: Normal range of motion. Neck supple.  Cardiovascular: Normal rate, regular rhythm, normal heart sounds and intact distal pulses.  Pulmonary/Chest: Effort normal and breath sounds normal.  Abdominal: Soft. Bowel sounds are normal.  Musculoskeletal: Normal range of motion. He exhibits edema (bilaterally lower extremities ).  Neurological: He is alert and oriented to person, place, and time.  Skin: Skin is warm and dry. Capillary refill takes less than 2 seconds.  Psychiatric: He has  a normal mood and affect. His behavior is normal. Judgment and thought content normal.  Nursing note and vitals reviewed.  Assessment & Plan:   1. Prediabetes Hgb A1c is normal at 5.3 today. Urinalysis is stable. Continue DASH diet, decrease high-fats and high-sugar foods, increase water intake, and at least 30 minute of cardio exercise every day.  - POCT urinalysis dipstick - POCT glycosylated hemoglobin (Hb A1C)  2. Bilateral leg pain - traMADol (ULTRAM) 50 MG tablet; Take 1 tablet (50 mg total) by mouth 2 (two) times daily as needed.  Dispense: 30 tablet; Refill: 1 We will refer him to Pain Clinicfor pain med management.  - Ambulatory referral to Pain Clinic  3. Peripheral edema Not worsening. He will continue Torsemide as prescribed.   4. Sarcoidosis of lung (HCC) Stable. No signs and symptoms of respiratory distress.   5. Moderate asthma without complication, unspecified whether persistent Stable today. He will continue Inhaler as needed.   6. Shortness of breath R/t chronic disease symptoms  7. Cough R/t chronic diseases.  8. Situational anxiety Stable.   9. Follow up Follow up in 3 months.   Meds ordered this encounter  Medications  . traMADol (ULTRAM) 50 MG tablet    Sig: Take 1 tablet (50 mg total) by mouth 2 (two) times daily as needed.    Dispense:  30 tablet    Refill:  1    Order Specific Question:   Supervising Provider    Answer:   Quentin Angst  [[id number]]   Raliegh Ip,  MSN, FNP-BC Patient Care [facility] [address] [place], [place] [address] [phone]

## 2017-07-28 NOTE — Telephone Encounter (Signed)
Attempted to contact pt. I did not receive an answer. There was no option for me to leave a message. Will try back.  

## 2017-07-28 NOTE — Telephone Encounter (Signed)
The diagnosis that has been given by Dr. Allena KatzPatel is an inflammatory myopathy, presumably related to sarcoidosis.  He needs an appointment with me to initiate anti-inflammatory therapy.  Also it is okay for us to write a letter that indicates that he is unable to ambulate due to the inflammatory myopathy.

## 2017-07-29 NOTE — Telephone Encounter (Signed)
Pt does have a f/u appt scheduled with RB Tuesday, 6/25 at 4:30pm.  Called pt stating RB stated it was fine for us to write a letter that he was needing. Pt stated he had already received a letter from another doctor.  Nothing further needed.

## 2017-08-02 ENCOUNTER — Ambulatory Visit: Payer: Medicaid Other | Admitting: Emergency Medicine

## 2017-08-03 ENCOUNTER — Telehealth: Payer: Self-pay | Admitting: Pulmonary Disease

## 2017-08-03 NOTE — Telephone Encounter (Signed)
Called patient to remind him to bring in his Cpap for his appt on Friday.

## 2017-08-04 NOTE — Progress Notes (Signed)
@Patient  ID: Hunter [name], male    DOB: Mar 16, 1970, 47 y.o.   MRN: [id number]  Chief Complaint  Patient presents with  . Follow-up    States he has been having dizziness and pain behind his eye. He feels his cpap pressures are good however his PCP suggested he may need to have his pressures adjusted.     Referring provider: Kallie Locks, FNP  HPI: Hunter [name] is a 47 y.o. 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 Delton Coombes.   Recent [facility] Encounters:   04/27/17 - OV - SG 47 year old man with a history of biopsy-proven sarcoidosis, skin biopsy 2002, off steroids since '07.  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  He was admitted in September 2018 with right heart failure, significant volume overload and decompensated pulmonary hypertension.  He has had pulmonary function testing 02/12/16 that showed evidence for mixed obstruction and restriction on spirometry, significant restriction on lung volumes.  CT scan performed on 11/11/16 was reviewed by me.  This shows no evidence of mediastinal or hilar lymphadenopathy (no contrast), chronic right-sided pleural thickening with some associated calcification, trace right-sided pleural effusion, chronic areas of linear scarring bilaterally.  No evidence of groundglass, consolidation. He is chronically on 3L/min, was sent home from recent hospitalization with BiPAP.  Pt. Presents for face to face for POC per his DME. He remains on 3 L oxygen per minute, and uses his BIPAP every  night 15/12. ( There is a note that he needs a titration study) . We will walk him on the POC we have in the office to ensure he maintains adequate oxygen saturations upon with the pulsed oxygen.He states he is at baseline re: his sarcoid. He states he is compliant with his BiPAP machine every night. He uses it with oxygen bleed in. He states he has no issues with the machine. He did bring his SIM card today, but there is another patient's name  on it. We cannot use the down Load until this has been corrected. The down Load does indicate good compliance with use 99% of the time and for about 10 hours daily. AHI shows 0.8. We have called [facility] to correct this problem.Pt. Denies fever, chest pain, orthopnea or hemoptysis. He states he has had an eye exam within the year. We will walk him with the simply go to ensure his saturations do not drop with the pulsed flow. His weight and edema have been stable.   Tests:   12/20/2016-split-night sleep study-AHI 13.3, mean O2 saturation 92%, minimum SPO2 during sleep was 74%  Imaging:  11/11/2016-CT chest high-res- appearance of chest severe appears very similar to prior study 11/25/2014, likely reflective of chronic sarcoidosis, severe dilatation of the pulmonic trunk and main pulmonary artery suggestive of underlying pulmonary artery serial hypertension  Cardiac:  11/06/2016-echocardiogram- LV ejection fraction 55 to 60%, pulmonary arteries: Systolic pressure could not be accurately estimated today due to lack of adequate TR jet, PA peak pressure 32  Labs:   Micro:   Chart Review:     08/05/17 OV Pleasant 47 year old patient of Dr. Delton Coombes presents today for office visit.  Unfortunately patient was scheduled to  see Dr. Delton Coombes yesterday but due to a mixup with time in scheduling he missed that appointment.  Patient with long history with skin biopsy for sarcoidosis.  Patient has also been seen by neurology for management of inflammatory myopathy.   Patient  also reports that he occasionally has some eye pain with BiPAP use.  Patient reporting that he has a regular follow-up with eye doctors.  Recently had an eye exam February/2019.  Patient feels that the eye pain is from BiPAP use.  Unfortunately there continues to be an ongoing problem with [facility] and his ST card.  We will work to address this today.  Patient presents with CPAP but his ST card is invalid, management will contact [facility].  Patient also reports that unfortunately he had a few times where he was using his BiPAP and his oxygen concentrator was off.  Patient verbalizes understanding he needs to be on oxygen when using his BiPAP.  Patient reports that he has been doing that now and feels much better.  Patient also reporting a right backache.  That occasionally happens always specifically his right back based his right middle lobe.  Patient reports that usually an ache that will occasionally happen but happens about 3 times a week.   Patient is extremely concerned about his weakness that seems to be progressing, and not improving.  Patient also emphasizes that he is concerned regarding this back pain.  Patient also has general questions regarding sarcoidosis and how does this develop or present.  No Active Allergies  Immunization History  Administered Date(s) Administered  . PPD Test 03/21/2006  . Pneumococcal Polysaccharide-23 03/21/2006     Past Medical History:  Diagnosis Date  . Arthritis    feet  . CHF (congestive heart failure) (HCC)   . Dyspnea   . Hypertension   . Nephrotic syndrome   . Pre-diabetes   . Sarcoid   . Sleep apnea     Tobacco History: Social History   Tobacco Use  Smoking Status Current Some Day Smoker  . Packs/day: 0.10  . Years: 10.00  . Pack years: 1.00  . Types: Cigarettes  Smokeless Tobacco Never Used  Tobacco Comment   2 cigarettes/day 08/05/17   Ready to quit: Not Answered Counseling given: Not Answered Comment: 2  cigarettes/day 08/05/17  We recommend that you stop smoking.  Continuing to smoke even 2 cigarettes a day can still make it difficult for [place] to manage her breathing as well as her sarcoidosis.  Outpatient Encounter Medications as of 08/05/2017  Medication Sig  . albuterol (PROVENTIL HFA;VENTOLIN HFA) 108 (90 Base) MCG/ACT inhaler Inhale 2 puffs into the lungs every 6 (six) hours as needed for wheezing or shortness of breath.  . ciclopirox (PENLAC) 8 % solution APPLY TOPICALLY AT BEDTIME OVER NAIL AND SURROUNDING SKIN. APPLY DAILY OVER PREVIOUS COAT. AFTER 7 DAYS, MAY REMOVE WITH ALCOHOL AND CONTINU  . diclofenac sodium (VOLTAREN) 1 % GEL APPLY 2 GRAMS TO FOOT (AND AFFECTED AREAS) 2-4 TIMES DAILY.  Marland Kitchen HYDROcodone-acetaminophen (NORCO/VICODIN) 5-325 MG tablet Take 1 tablet by mouth every 6 (six) hours as needed for moderate pain.  . magnesium oxide (MAG-OX) 400 (241.3 Mg) MG tablet Take 1 tablet (400 mg total) every evening by mouth.  . metFORMIN (GLUCOPHAGE) 500 MG tablet Take 1 tablet (500 mg total) by mouth 2 (two) times daily with a meal.  . torsemide (DEMADEX) 10 MG tablet Take 2 tablets (20 mg total) by mouth 2 (two) times daily.  Marland Kitchen  traMADol (ULTRAM) 50 MG tablet Take 1 tablet (50 mg total) by mouth 2 (two) times daily as needed.  . predniSONE (DELTASONE) 10 MG tablet Take 3 tablets (30 mg total) by mouth daily with breakfast.   No facility-administered encounter medications on file as of 08/05/2017.      Review of Systems  Constitutional:  +fatigue, weakness, occasional weight gain  No  weight loss, night sweats,  fevers, chills HEENT:  +eye pain / pressure after BiPap No headaches,  Difficulty swallowing,  Tooth/dental problems, or  Sore throat, No sneezing, itching, ear ache, nasal congestion, post nasal drip  CV: +LE swelling No chest pain,  orthopnea, PND, anasarca, dizziness, palpitations, syncope  GI: No heartburn, indigestion, abdominal pain, nausea, vomiting, diarrhea, change in  bowel habits, loss of appetite, bloody stools Resp: +increased sob with exertion and rest, occasional cough No shortness of breath with exertion or at rest.  No excess mucus, no productive cough,  No non-productive cough,  No coughing up of blood.  No change in color of mucus.  No wheezing.  No chest wall deformity Skin: no rash, lesions, no skin changes. GU: no dysuria, change in color of urine, no urgency or frequency.  No flank pain, no hematuria  MS: +weakness, occasional Right Upper back pain No joint pain or swelling.  No decreased range of motion. Psych:  No change in mood or affect. No depression or anxiety.  No memory loss.   Physical Exam  BP 130/88   Pulse 81   Ht 6' (1.829 m)   Wt 265 lb 3.2 oz (120.3 kg)   SpO2 97%   BMI 35.97 kg/m    Wt Readings from Last 3 Encounters:  08/05/17 265 lb 3.2 oz (120.3 kg)  07/25/17 262 lb (118.8 kg)  06/02/17 258 lb (117 kg)    GEN: A/Ox3; pleasant , NAD, well nourished, 2L [place]    HEENT:  [place]/AT,  EACs-clear, TMs-wnl, NOSE-clear, THROAT-clear, mallampati III, no lesions, no postnasal drip or exudate noted.   NECK:  Supple w/ fair ROM; no JVD; normal carotid impulses w/o bruits; no thyromegaly or nodules palpated; no lymphadenopathy.    RESP: + Poor air movement in the right lobe, good air movement in left lobe, no wheezes or rales  no accessory muscle use, no dullness to percussion  CARD:  RRR, no m/r/g, no peripheral edema, pulses intact, no cyanosis or clubbing.  GI:   Soft & nt; nml bowel sounds; no organomegaly or masses detected.   Musco: Warm bil, no deformities or joint swelling noted.   Neuro: alert, no focal deficits noted.    Skin: Warm, no lesions or rashes    Lab Results:  CBC    Component Value Date/Time   WBC 3.7 (L) 04/26/2017 1402   RBC 4.92 04/26/2017 1402   HGB 14.5 04/26/2017 1402   HCT 45.1 04/26/2017 1402   PLT 153 04/26/2017 1402   MCV 91.7 04/26/2017 1402   MCH 29.5 04/26/2017 1402   MCHC 32.2  04/26/2017 1402   RDW 14.0 04/26/2017 1402   LYMPHSABS 0.8 12/21/2016 0856   MONOABS 0.8 12/21/2016 0856   EOSABS 0.2 12/21/2016 0856   BASOSABS 0.0 12/21/2016 0856    BMET    Component Value Date/Time   NA 139 04/26/2017 1402   K 4.7 04/26/2017 1402   CL 101 04/26/2017 1402   CO2 26 04/26/2017 1402   GLUCOSE 95 04/26/2017 1402   BUN 41 (H) 04/26/2017 1402   CREATININE 1.01  04/26/2017 1402   CREATININE 1.17 11/23/2016 1056   CALCIUM 9.1 04/26/2017 1402   GFRNONAA >60 04/26/2017 1402   GFRNONAA 73 11/23/2016 1056   GFRAA >60 04/26/2017 1402   GFRAA 85 11/23/2016 1056    BNP    Component Value Date/Time   BNP 160.7 (H) 12/21/2016 0856   BNP 250 (H) 11/23/2016 1056    ProBNP No results found for: PROBNP  Imaging: Dg Chest 2 View  Result Date: 08/05/2017 CLINICAL DATA:  Sarcoidosis of the lung. Posterior chest pain, intermittent. EXAM: CHEST - 2 VIEW COMPARISON:  12/21/2016 FINDINGS: Chronic volume loss with pleural thickening or scarring at the right lung base. Chronic densities along the periphery of the left lung. No new airspace disease or pulmonary edema. Fullness in the hilar regions may be related to sarcoid and this finding is stable. Heart size is within normal limits. No acute bone abnormality. IMPRESSION: Chronic pleural and parenchymal disease without acute findings. Chronic changes are compatible with history of sarcoidosis. Electronically Signed   By: Richarda Overlie M.D.   On: 08/05/2017 10:32     Assessment & Plan:   Sarcoid myopathy  We will start patient on 30 mg of prednisone daily for the next 3 weeks.  We will have patient get scheduled with Dr. Delton Coombes for his next available appointment.  We will also do chest x-ray today as breath sounds are diminished on the right lobes.  Discussed extensively with patient that we may need CT following chest x-ray results.  Consider Pneumovax at next appointment.  Management had extensive discussions with [facility] as well as with the patient regarding follow-up with ST card for BiPAP.  Telephone number and business cards provided to the patient if he continues to have issues with [facility].  Sarcoidosis of lung (HCC) Start prednisone today 30 mg daily >>> Take with food >>> We will do this for about 3 to 4 weeks and then we can transition to a chronic management if still needed  Chest x-ray today >>> We may need to proceed forward with a CT based off of the x-ray findings  Follow-up with Dr. Delton Coombes first available  Continue oxygen therapy  Continue BiPAP >>> We will follow-up with [facility] about regarding her issues with your compliance card   Sarcoidosis (HCC) Start prednisone today 30 mg daily >>> Take with food >>> We will do this for about 3 to 4 weeks and then we can transition to a chronic management if still needed  Chest x-ray today >>> We may need to proceed forward with a CT based off of the x-ray findings  Follow-up with Dr. Delton Coombes first available  Continue oxygen therapy  Continue BiPAP >>> We will follow-up with [facility] about regarding her issues with your compliance card   Morbid obesity (HCC) Continue to work towards healthy weight.  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 Delton Coombes first available   Updated Dr. Delton Coombes regarding this case.  Appointment was 35 minutes long with over 50% of that time in direct face-to-face patient care, plan of care discussion, coordination of care.   Arlys John  Hughie Closs, NP 08/05/2017

## 2017-08-05 ENCOUNTER — Ambulatory Visit (INDEPENDENT_AMBULATORY_CARE_PROVIDER_SITE_OTHER): Payer: Medicaid Other | Admitting: Pulmonary Disease

## 2017-08-05 ENCOUNTER — Ambulatory Visit (INDEPENDENT_AMBULATORY_CARE_PROVIDER_SITE_OTHER)
Admission: RE | Admit: 2017-08-05 | Discharge: 2017-08-05 | Disposition: A | Discharge status: Home / Self Care | Payer: Medicaid Other | Source: Ambulatory Visit | Attending: Pulmonary Disease | Admitting: Pulmonary Disease

## 2017-08-05 ENCOUNTER — Encounter: Payer: Self-pay | Admitting: Pulmonary Disease

## 2017-08-05 VITALS — BP 130/88 | HR 81 | Ht 72.0 in | Wt 265.2 lb

## 2017-08-05 DIAGNOSIS — R531 Weakness: Secondary | ICD-10-CM | POA: Diagnosis not present

## 2017-08-05 DIAGNOSIS — D869 Sarcoidosis, unspecified: Secondary | ICD-10-CM

## 2017-08-05 DIAGNOSIS — D860 Sarcoidosis of lung: Secondary | ICD-10-CM | POA: Diagnosis not present

## 2017-08-05 MED ORDER — PREDNISONE 10 MG PO TABS: 30.0000 mg | ORAL_TABLET | Freq: Every day | ORAL | 0 refills | Status: DC

## 2017-08-05 NOTE — Assessment & Plan Note (Signed)
Continue to work towards healthy weight 

## 2017-08-05 NOTE — Patient Instructions (Addendum)
Start prednisone today 30 mg daily >>> Take with food >>> We will do this for about 3 to 4 weeks and then we can transition to a chronic management if still needed  Chest x-ray today >>> We may need to proceed forward with a CT based off of the x-ray findings  Follow-up with Dr. Delton CoombesByrum first available  Continue oxygen therapy  Continue BiPAP >>> We will follow-up with [facility] about regarding her issues with your compliance card      Please contact the office if your symptoms worsen or you have concerns that you are not improving.   Thank you for choosing [facility] for your healthcare, and for allowing us to partner with you on your healthcare journey. I am thankful to be able to provide care to you today.   Elisha HeadlandBrian Kaisyn Reinhold FNP-C

## 2017-08-05 NOTE — Assessment & Plan Note (Signed)
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 Delton CoombesByrum first available

## 2017-08-05 NOTE — Assessment & Plan Note (Signed)
Start prednisone today 30 mg daily >>> Take with food >>> We will do this for about 3 to 4 weeks and then we can transition to a chronic management if still needed  Chest x-ray today >>> We may need to proceed forward with a CT based off of the x-ray findings  Follow-up with Dr. Byrum first available  Continue oxygen therapy  Continue BiPAP >>> We will follow-up with [facility] about regarding her issues with your compliance card  

## 2017-08-05 NOTE — Assessment & Plan Note (Signed)
Start prednisone today 30 mg daily >>> Take with food >>> We will do this for about 3 to 4 weeks and then we can transition to a chronic management if still needed  Chest x-ray today >>> We may need to proceed forward with a CT based off of the x-ray findings  Follow-up with Dr. Delton CoombesByrum first available  Continue oxygen therapy  Continue BiPAP >>> We will follow-up with [facility] about regarding her issues with your compliance card

## 2017-08-10 NOTE — Progress Notes (Signed)
Chronic changes seen on your exam from known sarcoid.  No acute changes seen at this time.  Continue with current plan of care.   Can we ask [name]: How have things been going with the prednisone?  How are your symptoms today?  Hunter HeadlandBrian Omari Koslosky FNP

## 2017-08-10 NOTE — Progress Notes (Signed)
Addendum Error:   *Hunter [name]

## 2017-08-12 ENCOUNTER — Telehealth: Payer: Self-pay | Admitting: Pulmonary Disease

## 2017-08-12 NOTE — Telephone Encounter (Signed)
Pt came to office on 6.28.19 and a download was attempted from Bipap but there was an issues with card/machine and data could not be retrieved. Melissa with [facility] was made aware.   [facility] and was advised there was an issue with his loaner Bipap (this was given to pt upon hospital d/c). Per Melissa, the pts sleep study indicates the pt only needs CPAP (not bipap) with O2 bled in to machine. A new order is needing to be placed for CPAP.   Dr. Delton CoombesByrum please advise if ok to order.

## 2017-08-15 ENCOUNTER — Telehealth: Payer: Self-pay

## 2017-08-15 ENCOUNTER — Ambulatory Visit (HOSPITAL_COMMUNITY)
Admission: RE | Admit: 2017-08-15 | Discharge: 2017-08-15 | Disposition: A | Discharge status: Home / Self Care | Payer: Medicaid Other | Source: Ambulatory Visit | Attending: Cardiology | Admitting: Cardiology

## 2017-08-15 VITALS — BP 143/83 | HR 86 | Wt 264.0 lb

## 2017-08-15 DIAGNOSIS — E119 Type 2 diabetes mellitus without complications: Secondary | ICD-10-CM | POA: Insufficient documentation

## 2017-08-15 DIAGNOSIS — J9611 Chronic respiratory failure with hypoxia: Secondary | ICD-10-CM | POA: Diagnosis not present

## 2017-08-15 DIAGNOSIS — I50810 Right heart failure, unspecified: Secondary | ICD-10-CM | POA: Diagnosis not present

## 2017-08-15 DIAGNOSIS — Z7984 Long term (current) use of oral hypoglycemic drugs: Secondary | ICD-10-CM | POA: Insufficient documentation

## 2017-08-15 DIAGNOSIS — I110 Hypertensive heart disease with heart failure: Secondary | ICD-10-CM | POA: Insufficient documentation

## 2017-08-15 DIAGNOSIS — F17210 Nicotine dependence, cigarettes, uncomplicated: Secondary | ICD-10-CM | POA: Diagnosis not present

## 2017-08-15 DIAGNOSIS — I272 Pulmonary hypertension, unspecified: Secondary | ICD-10-CM | POA: Diagnosis not present

## 2017-08-15 DIAGNOSIS — N183 Chronic kidney disease, stage 3 unspecified: Secondary | ICD-10-CM

## 2017-08-15 DIAGNOSIS — G4733 Obstructive sleep apnea (adult) (pediatric): Secondary | ICD-10-CM | POA: Insufficient documentation

## 2017-08-15 DIAGNOSIS — D860 Sarcoidosis of lung: Secondary | ICD-10-CM | POA: Diagnosis not present

## 2017-08-15 DIAGNOSIS — I5082 Biventricular heart failure: Secondary | ICD-10-CM | POA: Insufficient documentation

## 2017-08-15 DIAGNOSIS — Z79899 Other long term (current) drug therapy: Secondary | ICD-10-CM | POA: Diagnosis not present

## 2017-08-15 DIAGNOSIS — N049 Nephrotic syndrome with unspecified morphologic changes: Secondary | ICD-10-CM | POA: Diagnosis not present

## 2017-08-15 DIAGNOSIS — Z590 Homelessness: Secondary | ICD-10-CM | POA: Insufficient documentation

## 2017-08-15 DIAGNOSIS — I5032 Chronic diastolic (congestive) heart failure: Secondary | ICD-10-CM | POA: Insufficient documentation

## 2017-08-15 DIAGNOSIS — G7249 Other inflammatory and immune myopathies, not elsewhere classified: Secondary | ICD-10-CM | POA: Insufficient documentation

## 2017-08-15 DIAGNOSIS — Z9981 Dependence on supplemental oxygen: Secondary | ICD-10-CM | POA: Diagnosis not present

## 2017-08-15 LAB — BASIC METABOLIC PANEL
Anion gap: 9 (ref 5–15)
BUN: 30 mg/dL — AB (ref 6–20)
CALCIUM: 8.9 mg/dL (ref 8.9–10.3)
CHLORIDE: 98 mmol/L (ref 98–111)
CO2: 30 mmol/L (ref 22–32)
CREATININE: 0.95 mg/dL (ref 0.61–1.24)
GFR calc non Af Amer: >60 mL/min (ref >60)
GLUCOSE: 222 mg/dL — AB (ref 70–99)
Potassium: 4.2 mmol/L (ref 3.5–5.1)
Sodium: 137 mmol/L (ref 135–145)

## 2017-08-15 NOTE — Patient Instructions (Signed)
Labs drawn today (if we do not call you, then your lab work was stable)   Your physician recommends that you schedule a follow-up appointment in: 1 month with [facility]

## 2017-08-15 NOTE — Telephone Encounter (Signed)
Patient states that a referral was suppose to be placed for a colonoscopy.

## 2017-08-16 ENCOUNTER — Other Ambulatory Visit: Payer: Self-pay | Admitting: Family Medicine

## 2017-08-16 DIAGNOSIS — R1032 Left lower quadrant pain: Principal | ICD-10-CM

## 2017-08-16 DIAGNOSIS — G8929 Other chronic pain: Secondary | ICD-10-CM

## 2017-08-16 NOTE — Progress Notes (Signed)
Advanced [facility] Failure Clinic Note   Primary Care: Joaquin Courts, FNP HF Cardiology: Dr. Shirlee Latch.   HPI: Hunter [name] is a 47 y.o. male with a past medical history of sarcoidosis, chronic hypoxemic respiratory failure (OSA/OHS), and focal segmental glomerulosclerosis with nephrotic syndrome. FSGS has been treated in the past with steroids, but this has been complicated by compliance as he is homeless. Last chest CT was in 3/18 and was suggestive of stage IV pulmonary sarcoidosis.  PFTs in 1/18 showed severe restriction and severe obstruction. Echo (9/18) showed normal LV EF with D-shaped interventricular septum and RV failure.  Admitted 11/05/16-11/17/16 with acute right heart failure/volume overload. Diuresed 50 pounds with IV Lasix. RHC results below.   He has been followed by neurology and has had muscle biopsy.  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  Neurology recommended that he start prednisone but he has not done so yet.   He returns for followup of RV failure and sarcoidosis.  Main limitation remains proximal muscle weakness.  He has difficulty especially climbing stairs.  NCS/EMG was consistent with myopathy.  Muscle biopsy as above showed inflammatory myopathy.  He is no longer taking a statin.  He is not short of breath walking on flat ground but will eventually fatigue.  Short of breath only with heavy exertion.  He is wearing 3 L oxygen during the day and Bipap at night. Weight is up 2 lbs.   Labs (11/18): BNP 161, K 4.2, creatinine 0.92 Labs (12/18): CK 670 Labs (3/19): K 4.7, creatinine 1.01  Past Medical History: 1. Sarcoidosis: Diagnosed by skin biopsy.  - PFTs (1/18): Severe obstruction and severe restriction.  - CT chest (2016, 9/18): Consistent with stage IV pulmonary sarcoidosis.  2. OHS/OSA: 3L home O2 during day, Bipap at night.  3. Chronic diastolic CHF/RV failure: Echo (9/18) with EF 55-60%, septal flattening, dilated RV with moderately  decreased systolic function.  PA pressure estimation likely inaccurate.  - RHC (10/18): mean RA 6, PA 49/20 mean 30, mean PCWP 10, CI 2.32, PVR 3.6 WU.  4. HTN 5. Focal segmental glomerulosclerosis: With nephrotic syndrome.  Followed by Dr. Kathrene Bongo.  Has been treated with steroids and Prograf in the past.  6. Myopathy with proximal muscle weakness.  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  7. Type II diabetes.   Review of Systems: All systems reviewed and negative except as per HPI.   Current Outpatient Medications  Medication Sig Dispense Refill  . albuterol (PROVENTIL HFA;VENTOLIN HFA) 108 (90 Base) MCG/ACT inhaler Inhale 2 puffs into the lungs every 6 (six) hours as needed for wheezing or shortness of breath. 1 Inhaler 5  . ciclopirox (PENLAC) 8 % solution APPLY TOPICALLY AT BEDTIME OVER NAIL AND SURROUNDING SKIN. APPLY DAILY OVER PREVIOUS COAT. AFTER 7 DAYS, MAY REMOVE WITH ALCOHOL AND CONTINU 6.6 mL 0  . diclofenac sodium (VOLTAREN) 1 % GEL APPLY 2 GRAMS TO FOOT (AND AFFECTED AREAS) 2-4 TIMES DAILY. 100 g 0  . magnesium oxide (MAG-OX) 400 (241.3 Mg) MG tablet Take 1 tablet (400 mg total) every evening by mouth. 90 tablet 2  . metFORMIN (GLUCOPHAGE) 500 MG tablet Take 1 tablet (500 mg total) by mouth 2 (two) times daily with a meal. 180 tablet 3  . predniSONE (DELTASONE) 10 MG tablet Take 3 tablets (30 mg total) by mouth daily with breakfast. 180 tablet 0  . torsemide (DEMADEX) 10 MG tablet Take 2 tablets (20 mg total) by mouth 2 (two) times  daily. 360 tablet 1  . traMADol (ULTRAM) 50 MG tablet Take 1 tablet (50 mg total) by mouth 2 (two) times daily as needed. 30 tablet 1  . HYDROcodone-acetaminophen (NORCO/VICODIN) 5-325 MG tablet Take 1 tablet by mouth every 6 (six) hours as needed for moderate pain. (Patient not taking: Reported on 08/15/2017) 20 tablet 0   No current facility-administered medications for this encounter.     No Active  Allergies  Social History   Socioeconomic History  . Marital status: Single    Spouse name: Not on file  . Number of children: 0  . Years of education: 68  . Highest education level: Not on file  Occupational History  . Not on file  Social Needs  . Financial resource strain: Not on file  . Food insecurity:    Worry: Not on file    Inability: Not on file  . Transportation needs:    Medical: Not on file    Non-medical: Not on file  Tobacco Use  . Smoking status: Current Some Day Smoker    Packs/day: 0.10    Years: 10.00    Pack years: 1.00    Types: Cigarettes  . Smokeless tobacco: Never Used  . Tobacco comment: 2 cigarettes/day 08/05/17  Substance and Sexual Activity  . Alcohol use: Yes    Alcohol/week: 1.8 oz    Types: 3 Cans of beer per week  . Drug use: No  . Sexual activity: Not on file  Lifestyle  . Physical activity:    Days per week: Not on file    Minutes per session: Not on file  . Stress: Not on file  Relationships  . Social connections:    Talks on phone: Not on file    Gets together: Not on file    Attends religious service: Not on file    Active member of club or organization: Not on file    Attends meetings of clubs or organizations: Not on file    Relationship status: Not on file  . Intimate partner violence:    Fear of current or ex partner: Not on file    Emotionally abused: Not on file    Physically abused: Not on file    Forced sexual activity: Not on file  Other Topics Concern  . Not on file  Social History Narrative   Lives alone.  Currently homeless and staying on the 3rd floor of a hotel with an elevator.     No children.  Education: high school.      Family History  Problem Relation Age of Onset  . Asthma Sister   . Hypertension Paternal Grandmother     Vitals:   08/15/17 1409  BP: (!) 143/83  Pulse: 86  SpO2: 96%  Weight: 264 lb (119.7 kg)   PHYSICAL EXAM: General: NAD Neck: No JVD, no thyromegaly or thyroid nodule.   Lungs: Clear to auscultation bilaterally with normal respiratory effort. CV: Nondisplaced PMI.  Heart regular S1/S2, no S3/S4, no murmur.  No peripheral edema.  No carotid bruit.  Normal pedal pulses.  Abdomen: Soft, nontender, no hepatosplenomegaly, no distention.  Skin: Intact without lesions or rashes.  Neurologic: Alert and oriented x 3.  Psych: Normal affect. Extremities: No clubbing or cyanosis.  HEENT: Normal.   ASSESSMENT & PLAN:  1.OHS/OSA: He is on 3 L home oxygen and Bipap at night.  He follows with pulmonary.  2. Sarcoidosis: Stage IV pulmonary sarcoidosis by CT chest (unchanged in 9/18 compared to 2016).  Sarcoidosis plays a role in hypoxemia as well. Additionally, he has an inflammatory myopathy with proximal muscle weakness most likely due to sarcoidosis.  Neurology has recommended prednisone but he has been reluctant to take it.  - I recommended that he start prednisone as ordered by neurology. He says that he will start it.  3. Chronic diastolic CHF/RV failure: RV dilated/dysfunctional on echo in 9/18 with D-shaped interventricular septum. After diuresis, filling pressures looked good on RHC 11/14/16.  On RHC, he had mild pulmonary hypertension with PVR 3.6 WU.  Possible component of group 5 PAH (from sarcoidosis) vs. Group 3 from OHS/OSA. PVR not particularly high and severe parenchymal disease from sarcoidosis, suspect that there is not going to be much benefit from treatment with selective pulmonary vasodilators. Volume looks ok on exam today.  - Continue torsemide 20 mg bid. BMET today.  - Continue oxygen to limit hypoxic pulmonary vasoconstriction.  4. Focal segmental glomerulosclerosis with nephrotic syndrome: Follows with nephrology.  - BMET today.   5. Myopathy: With proximal muscle weakness.  Muscle biopsy suggestive of inflammatory myopathy.  He has been off statin. CK mildly elevated.  Possible myopathy related to sarcoidosis.  - Planned for long steroid taper but has  not started it.  I talked to him today and requested that he start prednisone.   Followup with NP/PA in 1 month to reassess volume after starting steroids.    Marca Anconaalton Zyrion Coey, MD 08/16/17

## 2017-08-16 NOTE — Progress Notes (Unsigned)
Referral for Colonoscopy sent today.

## 2017-08-17 ENCOUNTER — Encounter: Payer: Self-pay | Admitting: Physician Assistant

## 2017-08-22 ENCOUNTER — Ambulatory Visit: Payer: Medicaid Other | Admitting: Pulmonary Disease

## 2017-08-22 ENCOUNTER — Telehealth: Payer: Self-pay

## 2017-08-22 NOTE — Telephone Encounter (Signed)
Patient had questions about insurance coverage and I directed him to his insurance carrier.

## 2017-08-23 NOTE — Progress Notes (Signed)
@Patient  ID: Hunter [name], male    DOB: 12-19-1970, 47 y.o.   MRN: [id number]  Chief Complaint  Patient presents with  . Follow-up    osa / bipap / sarcoid     Referring provider: Kallie Locks, FNP  HPI: Hunter [name] is a 47 y.o. 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 Delton Coombes.   Recent [facility] Encounters:   04/27/17 - OV - SG 47 year old man with a history of biopsy-proven sarcoidosis, skin biopsy 2002, off steroids since '07.  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  He was admitted in September 2018 with right heart failure, significant volume overload and decompensated pulmonary hypertension.  He has had pulmonary function testing 02/12/16 that showed evidence for mixed obstruction and restriction on spirometry, significant restriction on lung volumes.  CT scan performed on 11/11/16 was reviewed by me.  This shows no evidence of mediastinal or hilar lymphadenopathy (no contrast), chronic right-sided pleural thickening with some associated calcification, trace right-sided pleural effusion, chronic areas of linear scarring bilaterally.  No evidence of groundglass, consolidation. He is chronically on 3L/min, was sent home from recent hospitalization with BiPAP.  Pt. Presents for face to face for POC per his DME. He remains on 3 L oxygen per minute, and uses his BIPAP every night 15/12. ( There is a note that he needs a titration study) . We will walk him on the POC we have in the office to ensure he maintains adequate  oxygen saturations upon with the pulsed oxygen.He states he is at baseline re: his sarcoid. He states he is compliant with his BiPAP machine every night. He uses it with oxygen bleed in. He states he has no issues with the machine. He did bring his SIM card today, but there is another patient's name  on it. We cannot use the down Load until this has been corrected. The down Load does indicate good compliance with use 99% of the time and for about 10 hours daily. AHI shows 0.8. We have called [facility] to correct this problem.Pt. Denies fever, chest pain, orthopnea or hemoptysis. He states he has had an eye exam within the year. We will walk him with the simply go to ensure his saturations do not drop with the pulsed flow. His weight and edema have been stable.  08/05/17-OV-BM Pleasant 47 year old patient of Dr. Delton Coombes presents today for office visit.  Unfortunately patient was scheduled to see Dr. Delton Coombes yesterday but due to a mixup with time in scheduling he missed that appointment.  Patient with long history with skin biopsy for sarcoidosis.  Patient has also been seen by neurology for management of inflammatory myopathy.  Patient also reports that he occasionally has some eye pain with BiPAP use.  Patient reporting that he has a regular follow-up with eye doctors.  Recently had an eye exam February/2019.  Patient feels that the eye pain is from BiPAP use.  Unfortunately there continues to be an ongoing problem with [facility] and his ST card.  We will work to address this today.  Patient presents with CPAP but his ST card is invalid, management will contact [facility].  Patient also reports that unfortunately he had a few times where he was using his BiPAP and his oxygen concentrator was off.  Patient verbalizes understanding he needs to be on oxygen when using his BiPAP.  Patient reports that he has been doing that now and feels much better. Patient also reporting a right backache.  That  occasionally happens always specifically his right back based his right middle lobe.  Patient reports that usually an ache that will occasionally happen but happens about 3 times a week.  Patient is extremely concerned about his weakness that seems to be progressing, and not improving.  Patient also emphasizes that he is concerned regarding this back pain.  Patient also has general questions regarding sarcoidosis and how does this develop or present.   Tests:   12/20/2016-split-night sleep study-AHI 13.3, mean O2 saturation 92%, minimum SPO2 during sleep was 74%  Imaging:  11/11/2016-CT chest high-res- appearance of chest severe appears very similar to prior study 11/25/2014, likely reflective of chronic sarcoidosis, severe dilatation of the pulmonic trunk and main pulmonary artery suggestive of underlying pulmonary artery serial hypertension  Cardiac:  11/06/2016-echocardiogram- LV ejection fraction 55 to 60%, pulmonary arteries: Systolic pressure could not be accurately estimated today due to lack of adequate TR jet, PA peak pressure 32  Labs:   Micro:   Chart Review:      08/24/17 OV  Patient reporting office today reporting that he has been taking his prednisone daily he has not noticed much changes as far as in his breathing or his weakness.  Patient is not a large fan of taking prednisone daily but knows that this may help him.  We are continuing to have issues with BiPAP.  As well as getting compliance data.  It appears through chart review that [facility] needs patient have settings placed for CPAP as this is what his CPAP titration study in February/2019 showed.   No Active Allergies  Immunization History  Administered Date(s) Administered  . PPD Test 03/21/2006  . Pneumococcal Polysaccharide-23 03/21/2006    Past Medical History:  Diagnosis Date  . Arthritis    feet  . CHF (congestive heart failure) (HCC)   . Dyspnea   . Hypertension   . Nephrotic syndrome     . Pre-diabetes   . Sarcoid   . Sleep apnea     Tobacco History: Social History   Tobacco Use  Smoking Status Current Some Day Smoker  . Packs/day: 0.10  . Years: 10.00  . Pack years: 1.00  . Types: Cigarettes  Smokeless Tobacco Never Used  Tobacco Comment   2 cigarettes/day 08/05/17   Ready to quit: Not Answered Counseling given: Not Answered Comment: 2 cigarettes/day 08/05/17   Outpatient Encounter Medications as of 08/24/2017  Medication Sig  . albuterol (PROVENTIL HFA;VENTOLIN HFA) 108 (90 Base) MCG/ACT inhaler Inhale 2 puffs into the lungs every 6 (six) hours as needed for wheezing or shortness of breath.  . ciclopirox (PENLAC) 8 % solution APPLY TOPICALLY AT BEDTIME OVER NAIL AND SURROUNDING SKIN. APPLY DAILY OVER PREVIOUS COAT. AFTER 7 DAYS, MAY REMOVE WITH ALCOHOL AND CONTINU  . diclofenac sodium (VOLTAREN) 1 % GEL APPLY 2 GRAMS TO FOOT (AND AFFECTED AREAS) 2-4 TIMES DAILY.  Marland Kitchen HYDROcodone-acetaminophen (NORCO/VICODIN) 5-325 MG tablet Take 1 tablet by mouth every 6 (six) hours as needed for moderate pain.  . magnesium oxide (MAG-OX) 400 (241.3 Mg) MG tablet Take 1 tablet (400 mg total) every evening by mouth.  . metFORMIN (GLUCOPHAGE) 500  MG tablet Take 1 tablet (500 mg total) by mouth 2 (two) times daily with a meal.  . predniSONE (DELTASONE) 10 MG tablet Take 3 tablets (30 mg total) by mouth daily with breakfast.  . torsemide (DEMADEX) 10 MG tablet Take 2 tablets (20 mg total) by mouth 2 (two) times daily.  . traMADol (ULTRAM) 50 MG tablet Take 1 tablet (50 mg total) by mouth 2 (two) times daily as needed.  . predniSONE (DELTASONE) 20 MG tablet Take 2 tablets (4omg) po daily with food   No facility-administered encounter medications on file as of 08/24/2017.      Review of Systems  Review of Systems  Constitutional: Positive for chills and fatigue. Negative for activity change, fever and unexpected weight change.  HENT: Negative for postnasal drip, rhinorrhea, sinus  pressure, sinus pain, sneezing and sore throat.   Respiratory: Positive for cough and shortness of breath. Negative for wheezing.   Cardiovascular: Negative for chest pain and palpitations.  Gastrointestinal: Negative for constipation, diarrhea, nausea and vomiting.  Genitourinary: Negative for hematuria and urgency.  Musculoskeletal: Positive for myalgias. Negative for arthralgias.  Skin: Negative for color change.  Neurological: Positive for dizziness and weakness. Negative for seizures and headaches.  Psychiatric/Behavioral: Negative for dysphoric mood. The patient is not nervous/anxious.   All other systems reviewed and are negative.    Physical Exam  BP (!) 142/82   Pulse 64   Ht 6' (1.829 m)   Wt 262 lb 9.6 oz (119.1 kg)   SpO2 96%   BMI 35.61 kg/m   Wt Readings from Last 5 Encounters:  08/24/17 262 lb 9.6 oz (119.1 kg)  08/15/17 264 lb (119.7 kg)  08/05/17 265 lb 3.2 oz (120.3 kg)  07/25/17 262 lb (118.8 kg)  06/02/17 258 lb (117 kg)     Physical Exam  Constitutional: He is oriented to person, place, and time and well-developed, well-nourished, and in no distress. No distress.  HENT:  Head: Normocephalic and atraumatic.  Right Ear: Hearing, tympanic membrane, external ear and ear canal normal.  Left Ear: Hearing, tympanic membrane, external ear and ear canal normal.  Nose: Nose normal. Right sinus exhibits no maxillary sinus tenderness and no frontal sinus tenderness. Left sinus exhibits no maxillary sinus tenderness and no frontal sinus tenderness.  Mouth/Throat: Uvula is midline and oropharynx is clear and moist. No oropharyngeal exudate.  Eyes: Pupils are equal, round, and reactive to light.  Neck: Normal range of motion. Neck supple. No JVD present.  Cardiovascular: Normal rate, regular rhythm and normal heart sounds.  Pulmonary/Chest: Effort normal. No accessory muscle usage. No respiratory distress. He has decreased breath sounds. He has no wheezes. He has no  rhonchi.  Diminished breath sounds in right lung base, confirms imaging of chronic right pleural thickening  Abdominal: Soft. Bowel sounds are normal. There is no tenderness.  Musculoskeletal: Normal range of motion. He exhibits no edema.  Lymphadenopathy:    He has no cervical adenopathy.  Neurological: He is alert and oriented to person, place, and time. Gait normal.  Skin: Skin is warm and dry. He is not diaphoretic. No erythema.  Psychiatric: Mood, memory, affect and judgment normal.  Nursing note and vitals reviewed.    Lab Results:  CBC    Component Value Date/Time   WBC 3.7 (L) 04/26/2017 1402   RBC 4.92 04/26/2017 1402   HGB 14.5 04/26/2017 1402   HCT 45.1 04/26/2017 1402   PLT 153 04/26/2017 1402   MCV 91.7 04/26/2017 1402  MCH 29.5 04/26/2017 1402   MCHC 32.2 04/26/2017 1402   RDW 14.0 04/26/2017 1402   LYMPHSABS 0.8 12/21/2016 0856   MONOABS 0.8 12/21/2016 0856   EOSABS 0.2 12/21/2016 0856   BASOSABS 0.0 12/21/2016 0856    BMET    Component Value Date/Time   NA 137 08/15/2017 1534   K 4.2 08/15/2017 1534   CL 98 08/15/2017 1534   CO2 30 08/15/2017 1534   GLUCOSE 222 (H) 08/15/2017 1534   BUN 30 (H) 08/15/2017 1534   CREATININE 0.95 08/15/2017 1534   CREATININE 1.17 11/23/2016 1056   CALCIUM 8.9 08/15/2017 1534   GFRNONAA >60 08/15/2017 1534   GFRNONAA 73 11/23/2016 1056   GFRAA >60 08/15/2017 1534   GFRAA 85 11/23/2016 1056    BNP    Component Value Date/Time   BNP 160.7 (H) 12/21/2016 0856   BNP 250 (H) 11/23/2016 1056    ProBNP No results found for: PROBNP  Imaging: Dg Chest 2 View  Result Date: 08/05/2017 CLINICAL DATA:  Sarcoidosis of the lung. Posterior chest pain, intermittent. EXAM: CHEST - 2 VIEW COMPARISON:  12/21/2016 FINDINGS: Chronic volume loss with pleural thickening or scarring at the right lung base. Chronic densities along the periphery of the left lung. No new airspace disease or pulmonary edema. Fullness in the hilar  regions may be related to sarcoid and this finding is stable. Heart size is within normal limits. No acute bone abnormality. IMPRESSION: Chronic pleural and parenchymal disease without acute findings. Chronic changes are compatible with history of sarcoidosis. Electronically Signed   By: Richarda Overlie M.D.   On: 08/05/2017 10:32     Assessment & Plan:   47 year old patient seen office today.  Will increase prednisone to 40 mg daily.  Patient take this with food.  If patient is noticing that this is improving or helping with his weakness/sarcoidosis he is to contact our office to let us know.  If not over the coming weeks or months we can have patient slowly weaned to transition off the prednisone if he would like.  We will change patients home BiPAP to CPAP based off of February/2019 CPAP titration study results.  I have discussed this with the patient.  We will also work to get patient scheduled for BiPAP titration as I believe the patient will eventually need BiPAP as he likely has obesity hypoventilation syndrome.  In this progressive sarcoid myositis but probably also is furthering his restrictive lung disease.  Patient would prefer to remain on BiPAP but understands that we need to complete the titration study first in order to order those settings for his DME provider.  We will have patient get scheduled with Dr. Delton Coombes first first available.  Also walk patient in office today to see if they qualify for POC.  Restrictive lung disease secondary to obesity BiPAP titration ordered Continue CPAP  OSA (obstructive sleep apnea) CPAP settings ordered today based off of February/2019 CPAP titration, with 3 L O2 bled in  Obesity hypoventilation syndrome (HCC) Continue CPAP BiPAP titration study  Inflammatory myopathy Increase prednisone to 40 mg daily Take with food  Patient to contact our office in a few weeks and let us know how he is doing on this dose and if he feels that he is having any  changes or improving symptoms  Morbid obesity (HCC) Continue to work towards healthy weight Continue CPAP BiPAP titration study     Coral Ceo, NP 08/24/2017

## 2017-08-24 ENCOUNTER — Ambulatory Visit (INDEPENDENT_AMBULATORY_CARE_PROVIDER_SITE_OTHER): Payer: Medicaid Other | Admitting: Pulmonary Disease

## 2017-08-24 ENCOUNTER — Telehealth: Payer: Self-pay | Admitting: Pulmonary Disease

## 2017-08-24 ENCOUNTER — Encounter: Payer: Self-pay | Admitting: Pulmonary Disease

## 2017-08-24 VITALS — BP 142/82 | HR 64 | Ht 72.0 in | Wt 262.6 lb

## 2017-08-24 DIAGNOSIS — E662 Morbid (severe) obesity with alveolar hypoventilation: Secondary | ICD-10-CM | POA: Diagnosis not present

## 2017-08-24 DIAGNOSIS — G7249 Other inflammatory and immune myopathies, not elsewhere classified: Secondary | ICD-10-CM | POA: Diagnosis not present

## 2017-08-24 DIAGNOSIS — G4733 Obstructive sleep apnea (adult) (pediatric): Secondary | ICD-10-CM

## 2017-08-24 DIAGNOSIS — E669 Obesity, unspecified: Secondary | ICD-10-CM | POA: Diagnosis not present

## 2017-08-24 DIAGNOSIS — J984 Other disorders of lung: Secondary | ICD-10-CM

## 2017-08-24 MED ORDER — PREDNISONE 20 MG PO TABS: ORAL_TABLET | ORAL | 1 refills | Status: DC

## 2017-08-24 NOTE — Patient Instructions (Signed)
We will order BiPAP titration study today In order to correct your advance home care settings we will do CPAP continuous pressure at 10 which is based off your CPAP titration study in February/2019 Will increase prednisone dose to 40 mg daily >>> Take with food >>> Contact our office in a few weeks let us know how you are doing  Walk today in office for POC  Follow-up with our office if you are having difficulties obtaining BiPAP titration study or difficulty using your CPAP       Follow-up with Dr. Delton CoombesByrum as next available appointment     Please contact the office if your symptoms worsen or you have concerns that you are not improving.   Thank you for choosing [facility] for your healthcare, and for allowing us to partner with you on your healthcare journey. I am thankful to be able to provide care to you today.   Elisha HeadlandBrian Mack FNP-C  CPAP and BiPAP Information CPAP and BiPAP are methods of helping a person breathe with the use of air pressure. CPAP stands for "continuous positive airway pressure." BiPAP stands for "bi-level positive airway pressure." In both methods, air is blown through your nose or mouth and into your air passages to help you breathe well. CPAP and BiPAP use different amounts of pressure to blow air. With CPAP, the amount of pressure stays the same while you breathe in and out. With BiPAP, the amount of pressure is increased when you breathe in (inhale) so that you can take larger breaths. Your health care provider will recommend whether CPAP or BiPAP would be more helpful for you. Why are CPAP and BiPAP treatments used? CPAP or BiPAP can be helpful if you have:  Sleep apnea.  Chronic obstructive pulmonary disease (COPD).  Heart failure.  Medical conditions that weaken the muscles of the chest including muscular dystrophy, or neurological diseases such as amyotrophic lateral sclerosis (ALS).  Other problems that cause breathing to be weak,  abnormal, or difficult.  CPAP is most commonly used for obstructive sleep apnea (OSA) to keep the airways from collapsing when the muscles relax during sleep. How is CPAP or BiPAP administered? Both CPAP and BiPAP are provided by a small machine with a flexible plastic tube that attaches to a plastic mask. You wear the mask. Air is blown through the mask into your nose or mouth. The amount of pressure that is used to blow the air can be adjusted on the machine. Your health care provider will determine the pressure setting that should be used based on your individual needs. When should CPAP or BiPAP be used? In most cases, the mask only needs to be worn during sleep. Generally, the mask needs to be worn throughout the night and during any daytime naps. People with certain medical conditions may also need to wear the mask at other times when they are awake. Follow instructions from your health care provider about when to use the machine. What are some tips for using the mask?  Because the mask needs to be snug, some people feel trapped or closed-in (claustrophobic) when first using the mask. If you feel this way, you may need to get used to the mask. One way to do this is by holding the mask loosely over your nose or mouth and then gradually applying the mask more snugly. You can also gradually increase the amount of time that you use the mask.  Masks are available in various types and sizes. Some  fit over your mouth and nose while others fit over just your nose. If your mask does not fit well, talk with your health care provider about getting a different one.  If you are using a mask that fits over your nose and you tend to breathe through your mouth, a chin strap may be applied to help keep your mouth closed.  The CPAP and BiPAP machines have alarms that may sound if the mask comes off or develops a leak.  If you have trouble with the mask, it is very important that you talk with your health care  provider about finding a way to make the mask easier to tolerate. Do not stop using the mask. Stopping the use of the mask could have a negative impact on your health. What are some tips for using the machine?  Place your CPAP or BiPAP machine on a secure table or stand near an electrical outlet.  Know where the on/off switch is located on the machine.  Follow instructions from your health care provider about how to set the pressure on your machine and when you should use it.  Do not eat or drink while the CPAP or BiPAP machine is on. Food or fluids could get pushed into your lungs by the pressure of the CPAP or BiPAP.  Do not smoke. Tobacco smoke residue can damage the machine.  For home use, CPAP and BiPAP machines can be rented or purchased through home health care companies. Many different brands of machines are available. Renting a machine before purchasing may help you find out which particular machine works well for you.  Keep the CPAP or BiPAP machine and attachments clean. Ask your health care provider for specific instructions. Get help right away if:  You have redness or open areas around your nose or mouth where the mask fits.  You have trouble using the CPAP or BiPAP machine.  You cannot tolerate wearing the CPAP or BiPAP mask.  You have pain, discomfort, and bloating in your abdomen. Summary  CPAP and BiPAP are methods of helping a person breathe with the use of air pressure.  Both CPAP and BiPAP are provided by a small machine with a flexible plastic tube that attaches to a plastic mask.  If you have trouble with the mask, it is very important that you talk with your health care provider about finding a way to make the mask easier to tolerate. This information is not intended to replace advice given to you by your health care provider. Make sure you discuss any questions you have with your health care provider. Document Released: 10/24/2003 Document Revised: 12/15/2015  Document Reviewed: 12/15/2015 Elsevier Interactive Patient Education  2017 [employer].

## 2017-08-24 NOTE — Telephone Encounter (Signed)
Attempted to contact pt. I did not receive an answer. There was no option for me to leave a message. Will try back.  

## 2017-08-24 NOTE — Telephone Encounter (Signed)
Patient called today - he has an appt today and wants to know if we were able to obtain a download from his bipap for today's visit. He can be reached at [phone]

## 2017-08-24 NOTE — Assessment & Plan Note (Signed)
Continue CPAP BiPAP titration study

## 2017-08-24 NOTE — Assessment & Plan Note (Signed)
Increase prednisone to 40 mg daily Take with food  Patient to contact our office in a few weeks and let us know how he is doing on this dose and if he feels that he is having any changes or improving symptoms

## 2017-08-24 NOTE — Assessment & Plan Note (Signed)
BiPAP titration ordered Continue CPAP

## 2017-08-24 NOTE — Assessment & Plan Note (Signed)
Continue to work towards healthy weight Continue CPAP BiPAP titration study

## 2017-08-24 NOTE — Assessment & Plan Note (Signed)
CPAP settings ordered today based off of February/2019 CPAP titration, with 3 L O2 bled in

## 2017-08-24 NOTE — Telephone Encounter (Signed)
Called and spoke with pt regarding DL for OV today Was able to obtain DL on airview[facility] made B.Mack nurse aware DL is printed. Nothing further needed.

## 2017-08-24 NOTE — Telephone Encounter (Signed)
[phone] Advised Amber that pt is in need of cpap machine not bipap anymore per B.Mack & RB There is not enough data to backup pt on bipap machine, pt is now on cpap machine. Amber verbalized understanding, had nothing further needed at this time.

## 2017-08-30 ENCOUNTER — Ambulatory Visit: Payer: Medicaid Other | Admitting: Emergency Medicine

## 2017-09-02 ENCOUNTER — Ambulatory Visit: Payer: Medicaid Other | Admitting: Emergency Medicine

## 2017-09-07 ENCOUNTER — Ambulatory Visit (INDEPENDENT_AMBULATORY_CARE_PROVIDER_SITE_OTHER): Payer: Medicaid Other | Admitting: Physician Assistant

## 2017-09-07 ENCOUNTER — Encounter: Payer: Self-pay | Admitting: Physician Assistant

## 2017-09-07 VITALS — BP 140/98 | HR 71 | Ht 72.0 in | Wt 272.2 lb

## 2017-09-07 DIAGNOSIS — Z1211 Encounter for screening for malignant neoplasm of colon: Secondary | ICD-10-CM

## 2017-09-07 DIAGNOSIS — R109 Unspecified abdominal pain: Secondary | ICD-10-CM

## 2017-09-07 DIAGNOSIS — K625 Hemorrhage of anus and rectum: Secondary | ICD-10-CM

## 2017-09-07 NOTE — Patient Instructions (Signed)
Normal BMI (Body Mass Index- based on height and weight) is between 19 and 25. Your BMI today is Body mass index is 36.92 kg/m. Hunter [name]. Please consider follow up  regarding your BMI with your Primary Care Provider.  You have been scheduled for a colonoscopy. Please follow written instructions given to you at your visit today.  We  Have provided you with a prep for the colonoscopy. If you use inhalers (even only as needed), please bring them with you on the day of your procedure. .Hunter [name]

## 2017-09-07 NOTE — Progress Notes (Signed)
Subjective:    Patient ID: Hunter GasserRobert [name], male    DOB: Feb 27, 1970, 47 y.o.   MRN: [id number]  HPI Hunter MaduroRobert is a pleasant 47 year old [name], new to GI today referred by Raliegh IpNatalie Stroud, NP/primary care for evaluation of left-sided abdominal discomfort and for colon screening. Patient has not had any prior GI evaluation.  He does have significant comorbidities with hypertension, congestive heart failure with right ventricular failure, EF of 55%, obesity hypoventilation syndrome, sleep apnea, chronic respiratory failure requiring O2 at 3 L, chronic kidney disease stage III and sarcoidosis.  He is currently on a course of prednisone for exacerbation of sarcoidosis and is weaning. Patient says he has had some mild left-sided abdominal discomfort off and on for the past 3 or 4 months.  He says it has been sharp at times but is not constant.  His bowel movements have been normal.  Occasionally over the past year or so he has seen bright red blood both on the tissue and has on occasion seen some bright red blood in the commode.  No complaints of rectal pain or discomfort. Family history is negative for colon cancer and polyps as far as he is aware. His appetite is been good, his weight is been stable no complaints of nausea.  He does not feel that eating exacerbates abdominal discomfort.  Review of Systems Pertinent positive and negative review of systems were noted in the above HPI section.  All other review of systems was otherwise negative.  Outpatient Encounter Medications as of 09/07/2017  Medication Sig  . albuterol (PROVENTIL HFA;VENTOLIN HFA) 108 (90 Base) MCG/ACT inhaler Inhale 2 puffs into the lungs every 6 (six) hours as needed for wheezing or shortness of breath.  . ciclopirox (PENLAC) 8 % solution APPLY TOPICALLY AT BEDTIME OVER NAIL AND SURROUNDING SKIN. APPLY DAILY OVER PREVIOUS COAT. AFTER 7 DAYS, MAY REMOVE WITH ALCOHOL AND CONTINU  . diclofenac sodium (VOLTAREN) 1 % GEL APPLY  2 GRAMS TO FOOT (AND AFFECTED AREAS) 2-4 TIMES DAILY.  Marland Kitchen. HYDROcodone-acetaminophen (NORCO/VICODIN) 5-325 MG tablet Take 1 tablet by mouth every 6 (six) hours as needed for moderate pain.  . magnesium oxide (MAG-OX) 400 (241.3 Mg) MG tablet Take 1 tablet (400 mg total) every evening by mouth.  . metFORMIN (GLUCOPHAGE) 500 MG tablet Take 1 tablet (500 mg total) by mouth 2 (two) times daily with a meal.  . predniSONE (DELTASONE) 10 MG tablet Take 3 tablets (30 mg total) by mouth daily with breakfast.  . predniSONE (DELTASONE) 20 MG tablet Take 2 tablets (4omg) po daily with food  . torsemide (DEMADEX) 10 MG tablet Take 2 tablets (20 mg total) by mouth 2 (two) times daily.  . traMADol (ULTRAM) 50 MG tablet Take 1 tablet (50 mg total) by mouth 2 (two) times daily as needed.   No facility-administered encounter medications on file as of 09/07/2017.    No Active Allergies Patient Active Problem List   Diagnosis Date Noted  . Weakness 08/05/2017  . 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 Chronic diastolic CHF (congestive heart failure) (HCC)   . Acute right-sided CHF (congestive heart failure) (HCC)   . Pleural effusion, right   . Acute renal failure with acute tubular necrosis superimposed on stage 3 chronic kidney disease (HCC)   . Prediabetes   . Sarcoidosis of lung (HCC)   . Generalized edema   . Orthostatic hypotension 11/14/2016  . RVF (right ventricular failure) (HCC)   . Pressure  injury of skin 11/09/2016  . Acute on chronic respiratory failure with hypoxia and hypercapnia (HCC)   . Pulmonary edema 11/06/2016  . Restrictive lung disease secondary to obesity 11/06/2016  . Obesity hypoventilation syndrome (HCC) 11/06/2016  . OSA (obstructive sleep apnea) 11/06/2016  . CHF (congestive heart failure) (HCC) 11/05/2016  . HTN (hypertension) 11/02/2016  . Hypersomnia 03/02/2016  . Sarcoidosis 02/18/2016  . Hypoxemia 02/18/2016  .  Morbid obesity (HCC) 02/18/2016   Social History   Socioeconomic History  . Marital status: Single    Spouse name: Not on file  . Number of children: 0  . Years of education: 50  . Highest education level: Not on file  Occupational History  . Occupation: Not employed  Social Needs  . Financial resource strain: Not on file  . Food insecurity:    Worry: Not on file    Inability: Not on file  . Transportation needs:    Medical: Not on file    Non-medical: Not on file  Tobacco Use  . Smoking status: Current Some Day Smoker    Packs/day: 0.10    Years: 10.00    Pack years: 1.00    Types: Cigarettes  . Smokeless tobacco: Never Used  . Tobacco comment: 2 cigarettes/day 08/05/17  Substance and Sexual Activity  . Alcohol use: Yes    Alcohol/week: 1.8 oz    Types: 3 Cans of beer per week  . Drug use: No  . Sexual activity: Not on file  Lifestyle  . Physical activity:    Days per week: Not on file    Minutes per session: Not on file  . Stress: Not on file  Relationships  . Social connections:    Talks on phone: Not on file    Gets together: Not on file    Attends religious service: Not on file    Active member of club or organization: Not on file    Attends meetings of clubs or organizations: Not on file    Relationship status: Not on file  . Intimate partner violence:    Fear of current or ex partner: Not on file    Emotionally abused: Not on file    Physically abused: Not on file    Forced sexual activity: Not on file  Other Topics Concern  . Not on file  Social History Narrative   Lives alone.  Currently homeless and staying on the 3rd floor of a hotel with an elevator.     No children.  Education: high school.      Mr. Hedglin family history includes Asthma in his sister; Hypertension in his paternal grandmother.      Objective:    Vitals:   09/07/17 1315  BP: (!) 140/98  Pulse: 71    Physical Exam; well-developed [name] in no acute  distress, pleasant blood pressure 140/98, pulse 71, height 6 foot, weight 272, BMI 36.9 patient is on chronic O2 at 3 L.  HEENT; nontraumatic normocephalic EOMI PERRLA sclera anicteric, neck supple, Cardiovascular; regular rate and rhythm with S1-S2, Pulmonary ;scattered rhonchi, Abdomen ;obese, soft he has some mild tenderness with deep palpation of the left mid quadrant no guarding or rebound no palpable mass or hepatosplenomegaly bowel sounds are present, Rectal; exam not done, Extremities ;no clubbing cyanosis or edema skin warm and dry, Neuro psych; alert and oriented, grossly nonfocal mood and affect appropriate       Assessment & Plan:   #52 47 year old [name] with mild intermittent  left mid quadrant abdominal discomfort, and occasional sporadic small-volume bright red blood per rectum. I suspect the rectal bleeding is most likely related to internal hemorrhoids.  Etiology of the left mid quadrant discomfort unclear.  It is reassuring that his appetite is good, weight is stable no changes in bowel habits and no changes in discomfort with p.o. intake. Rule out occult colon lesion.  #2 chronic respiratory failure/on chronic oxygen 3 L Per 3 pulmonary sarcoidosis #4 obstructive sleep apnea #5.  Obesity hypoventilation syndrome #6.  Congestive heart failure EF 55% #7.  Hypertension #8 homelessness-discussed with patient, he is currently living at the [employer] and has been working.  Plan; Patient will be scheduled for colonoscopy with Dr. Russella Dar.  Procedure was discussed in detail with patient including indications risks and benefits and he is agreeable to proceed. Due to oxygen use procedure will be scheduled at the hospital.  Confirmed with patient that he will have no difficulty with access for bowel prep and also will be able to have a care partner come with him and provide transportation on the day of procedure.  Javarri Segal Oswald Hillock PA-C 09/07/2017   Cc: Kallie Locks, FNP

## 2017-09-08 NOTE — Progress Notes (Signed)
Reviewed and agree with management plan.  Denetra Formoso T. Moiz Ryant, MD FACG 

## 2017-09-13 ENCOUNTER — Telehealth: Payer: Self-pay | Admitting: Emergency Medicine

## 2017-09-13 DIAGNOSIS — R0902 Hypoxemia: Secondary | ICD-10-CM

## 2017-09-13 NOTE — Telephone Encounter (Signed)
Spoke with Barbara CowerJason, needs new order for POC ordered.  Order has been placed.  Nothing further needed.

## 2017-09-13 NOTE — Telephone Encounter (Signed)
Pt called regarding update on POC status [facility] LVM for Barbara CowerJason [facility] at phone [phone] for update  Called and spoke with patient to let him aware we are [facility] Pt would like update this week on his POC status Will wait to hear back from [place] [facility]

## 2017-09-13 NOTE — Telephone Encounter (Signed)
Barbara CowerJason, [facility], is returning call in regards to POC order update. Cb is [phone].

## 2017-09-15 ENCOUNTER — Encounter (HOSPITAL_COMMUNITY): Payer: Self-pay

## 2017-09-15 ENCOUNTER — Encounter (HOSPITAL_COMMUNITY): Payer: Medicaid Other

## 2017-09-15 ENCOUNTER — Ambulatory Visit (HOSPITAL_COMMUNITY)
Admission: RE | Admit: 2017-09-15 | Discharge: 2017-09-15 | Disposition: A | Discharge status: Home / Self Care | Payer: Medicaid Other | Source: Ambulatory Visit | Attending: Internal Medicine | Admitting: Internal Medicine

## 2017-09-15 VITALS — BP 146/100 | HR 120 | Wt 270.2 lb

## 2017-09-15 DIAGNOSIS — Z7952 Long term (current) use of systemic steroids: Secondary | ICD-10-CM | POA: Diagnosis not present

## 2017-09-15 DIAGNOSIS — D860 Sarcoidosis of lung: Secondary | ICD-10-CM | POA: Insufficient documentation

## 2017-09-15 DIAGNOSIS — F17210 Nicotine dependence, cigarettes, uncomplicated: Secondary | ICD-10-CM | POA: Diagnosis not present

## 2017-09-15 DIAGNOSIS — G7249 Other inflammatory and immune myopathies, not elsewhere classified: Secondary | ICD-10-CM | POA: Diagnosis not present

## 2017-09-15 DIAGNOSIS — Z590 Homelessness: Secondary | ICD-10-CM | POA: Insufficient documentation

## 2017-09-15 DIAGNOSIS — I272 Pulmonary hypertension, unspecified: Secondary | ICD-10-CM | POA: Diagnosis not present

## 2017-09-15 DIAGNOSIS — I5032 Chronic diastolic (congestive) heart failure: Secondary | ICD-10-CM | POA: Insufficient documentation

## 2017-09-15 DIAGNOSIS — Z9981 Dependence on supplemental oxygen: Secondary | ICD-10-CM | POA: Insufficient documentation

## 2017-09-15 DIAGNOSIS — N049 Nephrotic syndrome with unspecified morphologic changes: Secondary | ICD-10-CM | POA: Insufficient documentation

## 2017-09-15 DIAGNOSIS — Z79899 Other long term (current) drug therapy: Secondary | ICD-10-CM | POA: Diagnosis not present

## 2017-09-15 DIAGNOSIS — I5082 Biventricular heart failure: Secondary | ICD-10-CM | POA: Insufficient documentation

## 2017-09-15 DIAGNOSIS — G4733 Obstructive sleep apnea (adult) (pediatric): Secondary | ICD-10-CM

## 2017-09-15 DIAGNOSIS — I10 Essential (primary) hypertension: Secondary | ICD-10-CM | POA: Diagnosis not present

## 2017-09-15 DIAGNOSIS — I110 Hypertensive heart disease with heart failure: Secondary | ICD-10-CM | POA: Insufficient documentation

## 2017-09-15 DIAGNOSIS — I50810 Right heart failure, unspecified: Secondary | ICD-10-CM

## 2017-09-15 DIAGNOSIS — Z79891 Long term (current) use of opiate analgesic: Secondary | ICD-10-CM | POA: Diagnosis not present

## 2017-09-15 DIAGNOSIS — Z8249 Family history of ischemic heart disease and other diseases of the circulatory system: Secondary | ICD-10-CM | POA: Insufficient documentation

## 2017-09-15 DIAGNOSIS — Z7984 Long term (current) use of oral hypoglycemic drugs: Secondary | ICD-10-CM | POA: Diagnosis not present

## 2017-09-15 NOTE — Patient Instructions (Signed)
No changes to medication at this time.  Follow up 6-8 weeks with Dr. Shirlee LatchMcLean.  ___________________________________________________________________________ [place] RidgeGarage Code:  Take all medication as prescribed the day of your appointment. Bring all medications with you to your appointment.  Do the following things EVERYDAY: 1) Weigh yourself in the morning before breakfast. Write it down and keep it in a log. 2) Take your medicines as prescribed 3) Eat low salt foods-Limit salt (sodium) to 2000 mg per day.  4) Stay as active as you can everyday 5) Limit all fluids for the day to less than 2 liters

## 2017-09-15 NOTE — Progress Notes (Signed)
Advanced Heart Failure Clinic Note   Primary Care: Joaquin CourtsKimberly Harris, FNP HF Cardiology: Dr. Shirlee LatchMcLean.   HPI: Hunter GasserRobert Talsma is a 47 y.o. male  with a past medical history of sarcoidosis, chronic hypoxemic respiratory failure (OSA/OHS), and focal segmental glomerulosclerosis with nephrotic syndrome. FSGS has been treated in the past with steroids, but this has been complicated by compliance as he is homeless. Last chest CT was in 3/18 and was suggestive of stage IV pulmonary sarcoidosis.  PFTs in 1/18 showed severe restriction and severe obstruction. Echo (9/18) showed normal LV EF with D-shaped interventricular septum and RV failure.  Admitted 11/05/16-11/17/16 with acute right heart failure/volume overload. Diuresed 50 pounds with IV Lasix. RHC results below.   He has been followed by neurology and has had muscle biopsy.  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  Neurology recommended that he start prednisone but he has not done so yet.   He presents today for regular follow up of RV failure and sarcoidosis.  Weight up 3.5 kg since last visit. Has started on prednisone. He denies SOB, lightheadedness or dizziness. He overall feels Ok, just amped on prednisone. He hasn't taken his meds this am. He has not felt volume overloaded despite weight gain. Notes increased appetite. No orthopnea or PND. His biggest complaint is BLE weakness. He is unable to climb stairs without holding on and taking his time, and eventually fatigues on flat ground. Remains on 3L O2 in the am and Bipap at night.   Labs (11/18): BNP 161, K 4.2, creatinine 0.92 Labs (12/18): CK 670 Labs (3/19): K 4.7, creatinine 1.01  Past Medical History: 1. Sarcoidosis: Diagnosed by skin biopsy.  - PFTs (1/18): Severe obstruction and severe restriction.  - CT chest (2016, 9/18): Consistent with stage IV pulmonary sarcoidosis.  2. OHS/OSA: 3L home O2 during day, Bipap at night.  3. Chronic diastolic CHF/RV  failure: Echo (9/18) with EF 55-60%, septal flattening, dilated RV with moderately decreased systolic function.  PA pressure estimation likely inaccurate.  - RHC (10/18): mean RA 6, PA 49/20 mean 30, mean PCWP 10, CI 2.32, PVR 3.6 WU.  4. HTN 5. Focal segmental glomerulosclerosis: With nephrotic syndrome.  Followed by Dr. Kathrene BongoGoldsborough.  Has been treated with steroids and Prograf in the past.  6. Myopathy with proximal muscle weakness.  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  7. Type II diabetes.   Review of systems complete and found to be negative unless listed in HPI.    Current Outpatient Medications  Medication Sig Dispense Refill  . albuterol (PROVENTIL HFA;VENTOLIN HFA) 108 (90 Base) MCG/ACT inhaler Inhale 2 puffs into the lungs every 6 (six) hours as needed for wheezing or shortness of breath. 1 Inhaler 5  . ciclopirox (PENLAC) 8 % solution APPLY TOPICALLY AT BEDTIME OVER NAIL AND SURROUNDING SKIN. APPLY DAILY OVER PREVIOUS COAT. AFTER 7 DAYS, MAY REMOVE WITH ALCOHOL AND CONTINU 6.6 mL 0  . diclofenac sodium (VOLTAREN) 1 % GEL APPLY 2 GRAMS TO FOOT (AND AFFECTED AREAS) 2-4 TIMES DAILY. 100 g 0  . HYDROcodone-acetaminophen (NORCO/VICODIN) 5-325 MG tablet Take 1 tablet by mouth every 6 (six) hours as needed for moderate pain. 20 tablet 0  . magnesium oxide (MAG-OX) 400 (241.3 Mg) MG tablet Take 1 tablet (400 mg total) every evening by mouth. 90 tablet 2  . metFORMIN (GLUCOPHAGE) 500 MG tablet Take 1 tablet (500 mg total) by mouth 2 (two) times daily with a meal. 180 tablet 3  .  predniSONE (DELTASONE) 10 MG tablet Take 3 tablets (30 mg total) by mouth daily with breakfast. 180 tablet 0  . predniSONE (DELTASONE) 20 MG tablet Take 2 tablets (4omg) po daily with food 60 tablet 1  . torsemide (DEMADEX) 10 MG tablet Take 2 tablets (20 mg total) by mouth 2 (two) times daily. 360 tablet 1  . traMADol (ULTRAM) 50 MG tablet Take 1 tablet (50 mg total) by mouth 2  (two) times daily as needed. 30 tablet 1   No current facility-administered medications for this encounter.     No Known Allergies  Social History   Socioeconomic History  . Marital status: Single    Spouse name: Not on file  . Number of children: 0  . Years of education: 44  . Highest education level: Not on file  Occupational History  . Occupation: Not employed  Social Needs  . Financial resource strain: Not on file  . Food insecurity:    Worry: Not on file    Inability: Not on file  . Transportation needs:    Medical: Not on file    Non-medical: Not on file  Tobacco Use  . Smoking status: Current Some Day Smoker    Packs/day: 0.10    Years: 10.00    Pack years: 1.00    Types: Cigarettes  . Smokeless tobacco: Never Used  . Tobacco comment: 2 cigarettes/day 08/05/17  Substance and Sexual Activity  . Alcohol use: Yes    Alcohol/week: 3.0 standard drinks    Types: 3 Cans of beer per week  . Drug use: No  . Sexual activity: Not on file  Lifestyle  . Physical activity:    Days per week: Not on file    Minutes per session: Not on file  . Stress: Not on file  Relationships  . Social connections:    Talks on phone: Not on file    Gets together: Not on file    Attends religious service: Not on file    Active member of club or organization: Not on file    Attends meetings of clubs or organizations: Not on file    Relationship status: Not on file  . Intimate partner violence:    Fear of current or ex partner: Not on file    Emotionally abused: Not on file    Physically abused: Not on file    Forced sexual activity: Not on file  Other Topics Concern  . Not on file  Social History Narrative   Lives alone.  Currently homeless and staying on the 3rd floor of a hotel with an elevator.     No children.  Education: high school.      Family History  Problem Relation Age of Onset  . Asthma Sister   . Hypertension Paternal Grandmother   . Colon cancer Neg Hx         Patient doesn't know for sure if his family history has colon, pancreatic, rectal cancer    Vitals:   09/15/17 0917  BP: (!) 146/100  Pulse: (!) 120  SpO2: 98%  Weight: 122.6 kg     Wt Readings from Last 3 Encounters:  09/15/17 122.6 kg  09/07/17 123.5 kg  08/24/17 119.1 kg    PHYSICAL EXAM: General: Well appearing. No resp difficulty. HEENT: Normal Neck: Supple. JVP 5-6. Carotids 2+ bilat; no bruits. No thyromegaly or nodule noted. Cor: PMI nondisplaced. RRR, No M/G/R noted Lungs: CTAB, normal effort. Abdomen: Soft, non-tender, non-distended, no HSM. No bruits  or masses. +BS  Extremities: No cyanosis, clubbing, or rash. R and LLE no edema.  Neuro: Alert & orientedx3, cranial nerves grossly intact. moves all 4 extremities w/o difficulty. Affect pleasant   ASSESSMENT & PLAN:  1.OHS/OSA:  - He is on 3 L home oxygen and Bipap at night.  He follows with pulmonary.  - No change.  2. Sarcoidosis:  - Stage IV pulmonary sarcoidosis by CT chest (unchanged in 9/18 compared to 2016).  Sarcoidosis plays a role in hypoxemia as well. Additionally, he has an inflammatory myopathy with proximal muscle weakness most likely due to sarcoidosis.  - He has been on prednisone for about 3 weeks as ordered by neurology. No real improvement in his muscle weakness. He follows up with them next month. 3. Chronic diastolic CHF/RV failure:  - RV dilated/dysfunctional on echo in 9/18 with D-shaped interventricular septum. After diuresis, filling pressures looked good on RHC 11/14/16.  On RHC, he had mild pulmonary hypertension with PVR 3.6 WU.  Possible component of group 5 PAH (from sarcoidosis) vs. Group 3 from OHS/OSA. PVR not particularly high and severe parenchymal disease from sarcoidosis, suspect that there is not going to be much benefit from treatment with selective pulmonary vasodilators.  - NYHA III symptoms, confounded by profound LE weakness. - Volume status looks OK on exam despite weight gain.    - Continue torsemide 20 mg bid. Recent labs stable. Refuses labs today.  - Continue oxygen to limit hypoxic pulmonary vasoconstriction.  4. Focal segmental glomerulosclerosis with nephrotic syndrome: - Follows with nephrology in coming weeks. Refuses labs today.  5. Myopathy:  - With proximal muscle weakness.  Muscle biopsy suggestive of inflammatory myopathy.  He has been off statin. CK mildly elevated.  Possible myopathy related to sarcoidosis.  - Continue long steroid taper per neuro.   Relatively stable from HF perspective. Largest complaint and problem is his myopathy. Per neuro.  RTC 2 months. Sooner with HF symptoms.   Graciella Freer, PA-C 09/15/17   Greater than 50% of the 30 minute visit was spent in counseling/coordination of care regarding disease state education, salt/fluid restriction, sliding scale diuretics, and medication compliance.

## 2017-09-19 ENCOUNTER — Encounter (HOSPITAL_BASED_OUTPATIENT_CLINIC_OR_DEPARTMENT_OTHER): Payer: Medicaid Other

## 2017-09-19 ENCOUNTER — Telehealth: Payer: Self-pay

## 2017-09-19 NOTE — Telephone Encounter (Signed)
-----   Message from Lemar LoftyGabriel Mansouraty Jr., MD sent at 09/19/2017 11:26 AM EDT ----- Lanora ManisElizabeth, I'm happy with that. Thanks. Gabe ----- Message ----- From: Evalee JeffersonMcKew, Zyan Mirkin A, LPN Sent: 1/61/09608/01/2018  11:15 AM EDT To: Annett FabianSheri L Jones, RN, Lemar LoftyGabriel Mansouraty Jr., MD  Please review. Is it okay to schedule this patient for your procedure day in September on the 11th? He is on oxygen. I will have to get to him soon in case he loses his residency with [facility] and becomes difficult to contact. Thank you  ----- Message ----- From: Sammuel CooperEsterwood, Amy S, PA-C Sent: 09/13/2017  12:57 PM EDT To: Evalee JeffersonElizabeth A Trellis Guirguis, LPN  Beth , per Dr Russella DarStark , please take this pt off of his hospital procedure schedule , and find a date for pt to have Colonoscopy with Dr Meridee ScoreMansouraty  during one of his hospital blocks in September . Thanks.  I saw pt in office - can refer to my note ----- Message ----- From: Meryl DareStark, Malcolm T, MD Sent: 09/08/2017   1:29 PM To: Amy S Esterwood, PA-C  Yes please change this to GM. Want to be sure GM and VC [facility] relatively [facility]   ----- Message ----- From: Peterson AoEsterwood, Amy S, PA-C Sent: 09/08/2017  12:35 PM To: Meryl DareMalcolm T Stark, MD   You had a hospital block in September and it wasn't urgent, and you were supervising... That's why..  I guess Mansouraty does have hospital blocks in September so I can get him moved - Aurea GraffJoan said using Mansouraty hospital spots is to be MD directed right ?so if you say so then 'Ill get him put on his schedule.  ----- Message ----- From: Meryl DareStark, Malcolm T, MD Sent: 09/08/2017   7:31 AM To: Sammuel CooperAmy S Esterwood, PA-C  Why is the new patient scheduled with me and not GM, VC or KB? Should be doc with most procedure availability.

## 2017-09-19 NOTE — Telephone Encounter (Signed)
Left a message on the patient's voicemail to call us about moving the procedure date.

## 2017-09-20 ENCOUNTER — Encounter: Payer: Self-pay | Admitting: Emergency Medicine

## 2017-09-20 ENCOUNTER — Ambulatory Visit (INDEPENDENT_AMBULATORY_CARE_PROVIDER_SITE_OTHER): Payer: Medicaid Other | Admitting: Emergency Medicine

## 2017-09-20 DIAGNOSIS — G4733 Obstructive sleep apnea (adult) (pediatric): Secondary | ICD-10-CM

## 2017-09-20 DIAGNOSIS — E669 Obesity, unspecified: Secondary | ICD-10-CM

## 2017-09-20 DIAGNOSIS — J984 Other disorders of lung: Secondary | ICD-10-CM | POA: Diagnosis not present

## 2017-09-20 DIAGNOSIS — I2721 Secondary pulmonary arterial hypertension: Secondary | ICD-10-CM

## 2017-09-20 DIAGNOSIS — E662 Morbid (severe) obesity with alveolar hypoventilation: Secondary | ICD-10-CM | POA: Diagnosis not present

## 2017-09-20 DIAGNOSIS — G7249 Other inflammatory and immune myopathies, not elsewhere classified: Secondary | ICD-10-CM

## 2017-09-20 DIAGNOSIS — D860 Sarcoidosis of lung: Secondary | ICD-10-CM | POA: Diagnosis not present

## 2017-09-20 NOTE — Telephone Encounter (Signed)
I have spoken with the patient. He declines the new appointment or to move the procedure at all. He states he has a difficult situation and has already made his arrangements. He plans to keep the appointment date. He does not plan to move his procedure date.

## 2017-09-20 NOTE — Telephone Encounter (Signed)
Thanks for update. Sounds good.

## 2017-09-20 NOTE — Assessment & Plan Note (Signed)
I am concerned that in addition to restriction due to his size he also has some degree of respiratory muscle weakness given his sarcoid myopathy.

## 2017-09-20 NOTE — Assessment & Plan Note (Signed)
Suspect that he will ultimately require BiPAP given his OHS and central apneas.  We have arranged for BiPAP titration study which is pending

## 2017-09-20 NOTE — Telephone Encounter (Signed)
When I spoke to him, yes that was his plan, to keep the original appointment.

## 2017-09-20 NOTE — Patient Instructions (Addendum)
Please continue your oxygen at 3 L/min at all times. Get your BiPAP titration study done as planned.  Until we follow-up regarding that study please continue your CPAP every night as you have been using it. Continue prednisone 40 mg daily. Follow-up with Dr. Allena KatzPatel with neurology as planned. Depending on your progress with your leg discomfort and strength we will decide when to taper your prednisone in the future. Keep albuterol available to use 2 puffs if needed for shortness of breath. Work on stopping smoking completely. Follow with Dr Delton CoombesByrum in 1 month

## 2017-09-20 NOTE — Assessment & Plan Note (Signed)
With some chronic right-sided pleural disease, minimal to no parenchymal disease stable on serial scans, most recent 11/11/2016.

## 2017-09-20 NOTE — Progress Notes (Signed)
Subjective:    Patient ID: Hunter [name], male    DOB: 09/24/1970, 47 y.o.   MRN: [id number]  HPI 47 year old man with a history of biopsy-proven sarcoidosis, skin biopsy 2002, off steroids since '07.  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  He was admitted in September 2018 with right heart failure, significant volume overload and decompensated pulmonary hypertension.  He has had pulmonary function testing 02/12/16 that showed evidence for mixed obstruction and restriction on spirometry, significant restriction on lung volumes.  CT scan performed on 11/11/16 was reviewed by me.  This shows no evidence of mediastinal or hilar lymphadenopathy (no contrast), chronic right-sided pleural thickening with some associated calcification, trace right-sided pleural effusion, chronic areas of linear scarring bilaterally.  No evidence of groundglass, consolidation. He is chronically on 3L/min, was sent home from recent hospitalization with BiPAP, needs a titration study, currently on 15/12. His wt and edema have been stable.   He does have SOB with exertion, even w his O2 on. Has not used BD's  He is in the [employer], is staying at a hotel with their assistance. Medicaid is pending. They have also helped with securing his BiPAP.    ROV 02/14/17 --47 year old man with sarcoidosis, obesity, diastolic dysfunction chronic renal insufficiency.  He has probable obesity hypoventilation syndrome/OSA and we sent him for a split-night sleep study to titrate BiPAP.  Unfortunately he did not have enough events early in the evening to trigger BiPAP titration.  He is currently using 15/12. He feels that he is benefiting from the BiPAP, sleep is better, energy is better. We did a  trial of albuterol > he is unsure that he benefits. He feels side effects, some tremor. He usually wears 3L/min.    ROV 09/20/17 --47 year old male with history of sarcoidosis, obesity, diastolic dysfunction, chronic renal insufficiency (FSGS), obesity hypoventilation/OSA, secondary PAH.  He has been on BiPAP before, more recently titrated for CPAP apparently due to requirements to get coverage through his DME.  Stable chest imaging with most recent CT scan 11/11/2016 showing chronic right-sided pleural thickening and calcification some few scattered areas of chronic pleural-parenchymal interstitial disease without significant infiltrate, honeycombing, traction bronchiectasis, etc.  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  Prednisone was initiated and he has been on it since 6/28, dose recently increased to 40 mg daily given continued weakness and discomfort on 7/17. He can tell a difference on the higher dose prednisone.  He is on O2 at 3L/min.  He reports that he is experiencing less weakness, but still notices difficulty climbing stairs, heavier exertion.  He is using CPAP reliably every night.    R heart cath 11/11/16:  PAP 49/20 (30)  PAOP mean 10 PVR 3.6 Wood units Her cardiac output (Fick) 5.57  Review of Systems  Past Medical History:  Diagnosis Date  . Arthritis    feet  . CHF (congestive heart failure) (HCC)   . Dyspnea   . Hypertension   . Nephrotic syndrome   . Pre-diabetes   . Sarcoid   . Sleep apnea      Family History  Problem Relation Age of Onset  . Asthma Sister   . Hypertension Paternal Grandmother   . Colon cancer Neg Hx  Patient doesn't know for sure if his family history has colon, pancreatic, rectal cancer     Social History   Socioeconomic History  . Marital status: Single    Spouse name: Not on file  . Number of children: 0  . Years of education: 7312  . Highest education level: Not on  file  Occupational History  . Occupation: Not employed  Social Needs  . Financial resource strain: Not on file  . Food insecurity:    Worry: Not on file    Inability: Not on file  . Transportation needs:    Medical: Not on file    Non-medical: Not on file  Tobacco Use  . Smoking status: Current Some Day Smoker    Packs/day: 0.10    Years: 10.00    Pack years: 1.00    Types: Cigarettes  . Smokeless tobacco: Never Used  . Tobacco comment: 2 cigarettes/day 08/05/17  Substance and Sexual Activity  . Alcohol use: Yes    Alcohol/week: 3.0 standard drinks    Types: 3 Cans of beer per week  . Drug use: No  . Sexual activity: Not on file  Lifestyle  . Physical activity:    Days per week: Not on file    Minutes per session: Not on file  . Stress: Not on file  Relationships  . Social connections:    Talks on phone: Not on file    Gets together: Not on file    Attends religious service: Not on file    Active member of club or organization: Not on file    Attends meetings of clubs or organizations: Not on file    Relationship status: Not on file  . Intimate partner violence:    Fear of current or ex partner: Not on file    Emotionally abused: Not on file    Physically abused: Not on file    Forced sexual activity: Not on file  Other Topics Concern  . Not on file  Social History Narrative   Lives alone.  Currently homeless and staying on the 3rd floor of a hotel with an elevator.     No children.  Education: high school.       No Known Allergies   Outpatient Medications Prior to Visit  Medication Sig Dispense Refill  . albuterol (PROVENTIL HFA;VENTOLIN HFA) 108 (90 Base) MCG/ACT inhaler Inhale 2 puffs into the lungs every 6 (six) hours as needed for wheezing or shortness of breath. 1 Inhaler 5  . ciclopirox (PENLAC) 8 % solution APPLY TOPICALLY AT BEDTIME OVER NAIL AND SURROUNDING SKIN. APPLY DAILY OVER PREVIOUS COAT. AFTER 7 DAYS, MAY REMOVE WITH ALCOHOL AND CONTINU 6.6 mL 0    . diclofenac sodium (VOLTAREN) 1 % GEL APPLY 2 GRAMS TO FOOT (AND AFFECTED AREAS) 2-4 TIMES DAILY. 100 g 0  . HYDROcodone-acetaminophen (NORCO/VICODIN) 5-325 MG tablet Take 1 tablet by mouth every 6 (six) hours as needed for moderate pain. 20 tablet 0  . magnesium oxide (MAG-OX) 400 (241.3 Mg) MG tablet Take 1 tablet (400 mg total) every evening by mouth. 90 tablet 2  . metFORMIN (GLUCOPHAGE) 500 MG tablet Take 1 tablet (500 mg total) by mouth 2 (two) times daily with a meal. 180 tablet 3  . predniSONE (DELTASONE) 20 MG tablet Take 2 tablets (4omg) po daily with food 60 tablet 1  . torsemide (DEMADEX) 10 MG tablet Take 2 tablets (20 mg total) by mouth 2 (two) times daily. 360 tablet 1  . traMADol (  ULTRAM) 50 MG tablet Take 1 tablet (50 mg total) by mouth 2 (two) times daily as needed. 30 tablet 1  . predniSONE (DELTASONE) 10 MG tablet Take 3 tablets (30 mg total) by mouth daily with breakfast. 180 tablet 0   No facility-administered medications prior to visit.         Objective:   Physical Exam Vitals:   09/20/17 1148  BP: 124/82  Pulse: 62  SpO2: 97%  Weight: 266 lb (120.7 kg)  Height: 6' (1.829 m)   Gen: Pleasant, well-nourished, in no distress,  normal affect  ENT: No lesions,  mouth clear,  oropharynx clear, no postnasal drip  Neck: No JVD, no stridor  Lungs: No use of accessory muscles, small lung volumes, no wheeze or crackles.   Cardiovascular: RRR, heart sounds normal, no murmur or gallops, no peripheral edema  Musculoskeletal: No deformities, no cyanosis or clubbing   Neuro: alert, good strength B LE's  Skin: Warm, no lesions or rashes     Assessment & Plan:  Sarcoidosis of lung (HCC) With some chronic right-sided pleural disease, minimal to no parenchymal disease stable on serial scans, most recent 11/11/2016.  Restrictive lung disease secondary to obesity I am concerned that in addition to restriction due to his size he also has some degree of respiratory  muscle weakness given his sarcoid myopathy.  Obesity hypoventilation syndrome (HCC) Suspect that he will ultimately require BiPAP given his OHS and central apneas.  We have arranged for BiPAP titration study which is pending  OSA (obstructive sleep apnea) Continue his CPAP for now.  As above I think he will likely ultimately require BiPAP.  Inflammatory myopathy He has been on prednisone since 6/28, increased to 40 mg daily on 7/17.  I will plan to keep him on therapy for 1 more month and then taper.  He has a follow-up with Dr. Allena KatzPatel and her input will be important with regard to any additional work-up that needs to be done to confirm response.  Appreciate her assistance. Depending on response and duration of improvement we may need to consider chronic steroid-sparing agent, rheum referral or tertiary center.   Secondary pulmonary arterial hypertension (HCC) Following with Dr Shirlee LatchMcLean at advanced CHF clinic.   Levy Pupaobert Retia Cordle, MD, PhD 09/20/2017, 12:32 PM [facility] [phone] or if no answer [phone]

## 2017-09-20 NOTE — Assessment & Plan Note (Signed)
Continue his CPAP for now.  As above I think he will likely ultimately require BiPAP.

## 2017-09-20 NOTE — Telephone Encounter (Signed)
Making sure we   are all on same page .Marland Kitchen.Marland Kitchen.pt is keeping his original scheduled appt time with Dr Russella DarStark.. Correct ?  Will forward this to Dr Russella DarStark as well.

## 2017-09-20 NOTE — Assessment & Plan Note (Signed)
Following with Dr Shirlee LatchMcLean at advanced CHF clinic.

## 2017-09-20 NOTE — Assessment & Plan Note (Addendum)
He has been on prednisone since 6/28, increased to 40 mg daily on 7/17.  I will plan to keep him on therapy for 1 more month and then taper.  He has a follow-up with Dr. Allena KatzPatel and her input will be important with regard to any additional work-up that needs to be done to confirm response.  Appreciate her assistance. Depending on response and duration of improvement we may need to consider chronic steroid-sparing agent, rheum referral or tertiary center.

## 2017-09-28 ENCOUNTER — Ambulatory Visit (INDEPENDENT_AMBULATORY_CARE_PROVIDER_SITE_OTHER): Payer: Medicaid Other | Admitting: Neurology

## 2017-09-28 VITALS — BP 166/98 | HR 88 | Ht 72.0 in | Wt 277.0 lb

## 2017-09-28 DIAGNOSIS — G7249 Other inflammatory and immune myopathies, not elsewhere classified: Secondary | ICD-10-CM | POA: Diagnosis not present

## 2017-09-28 DIAGNOSIS — D869 Sarcoidosis, unspecified: Secondary | ICD-10-CM

## 2017-09-28 NOTE — Progress Notes (Signed)
Follow-up Visit   Date: 09/28/17   Devansh Riese MRN: [id number] DOB: 17-Feb-1970   Interim History: Tyvon Eggenberger is a 47 y.o. right-handed African American male with obesity, hypertension, stage IV pulmonary sarcoidosis on 2L [place] (2002 by skin biopsy), CHF, focal segmental glomerulosclerosis with nephrotic disease, restless leg syndrome, tobacco use returning to the clinic for follow-up of sarcoid myopathy.  The patient was accompanied to the clinic by self.  History of present illness: He began having weakness of the leg since around 2016 with difficulty climbing stairs.  He used to have a 10-minute walk into his workplace from his parking lot.  Because of his shortness of breath/fatigue, he would have to come an hour before work to allow for frequent breaks.  He did not have to sit down and did not feel weakness of the legs, but would stand for a few minutes and then walk again.  He used to drive a forklift and reports having difficulty and weakness of the legs with getting in and out of it.  He stopped working on November 02, 2016 and is applying for disability.  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  He is not on any immunotherapy at this time. Steroid-induced myopathy was raised as a possibility by his nephrologist. Despite stopping these medications, he has not had any improvement in lower leg strength.  He was hospitalized in October 2018 for acute on chronic heart failure and volume overload.  Following this, he started using crutches because of gout in his left foot and generalized weakness.  Prior to this, he was walking unassisted.  He has some arm weakness and stiffness of the shoulder with reaching for objects.   In December 2018, he had NCS/EMG of the right side which showed an irritable myopathy and subsequently underwent muscle biopsy of the left deltoid and quadriceps muscles showing  lymphohistiocytic inflammatory myopathy.   UPDATE 09/28/2017:  He is here for follow-up visit.  He was started on prednisone 40mg  in June for sarcoid myopathy by Dr. Delton Coombes, whose help is appreciated.  Patient feels that his strength in the legs is better and gait has improved, but still has difficulty with raising his legs to climb stairs.  He always depends on handrails to help pull him up, and struggles when stairways do not have a banister.  He no longer has pain in the legs or weakness of the arms.  He is bothered by his weight gain and increased appetite, which is mostly due to prednisone.    Medications:  Current Outpatient Medications on File Prior to Visit  Medication Sig Dispense Refill  . albuterol (PROVENTIL HFA;VENTOLIN HFA) 108 (90 Base) MCG/ACT inhaler Inhale 2 puffs into the lungs every 6 (six) hours as needed for wheezing or shortness of breath. 1 Inhaler 5  . ciclopirox (PENLAC) 8 % solution APPLY TOPICALLY AT BEDTIME OVER NAIL AND SURROUNDING SKIN. APPLY DAILY OVER PREVIOUS COAT. AFTER 7 DAYS, MAY REMOVE WITH ALCOHOL AND CONTINU 6.6 mL 0  . diclofenac sodium (VOLTAREN) 1 % GEL APPLY 2 GRAMS TO FOOT (AND AFFECTED AREAS) 2-4 TIMES DAILY. 100 g 0  . HYDROcodone-acetaminophen (NORCO/VICODIN) 5-325 MG tablet Take 1 tablet by mouth every 6 (six) hours as needed for moderate pain. 20 tablet 0  . magnesium oxide (MAG-OX) 400 (241.3 Mg) MG tablet Take 1 tablet (400 mg total) every evening by mouth. 90 tablet 2  . metFORMIN (GLUCOPHAGE) 500  MG tablet Take 1 tablet (500 mg total) by mouth 2 (two) times daily with a meal. 180 tablet 3  . predniSONE (DELTASONE) 20 MG tablet Take 2 tablets (4omg) po daily with food 60 tablet 1  . torsemide (DEMADEX) 10 MG tablet Take 2 tablets (20 mg total) by mouth 2 (two) times daily. 360 tablet 1  . traMADol (ULTRAM) 50 MG tablet Take 1 tablet (50 mg total) by mouth 2 (two) times daily as needed. 30 tablet 1   No current facility-administered medications on  file prior to visit.     Allergies: No Known Allergies  Review of Systems:  CONSTITUTIONAL: No fevers, chills, night sweats, or weight loss.  EYES: No visual changes or eye pain ENT: No hearing changes.  No history of nose bleeds.   RESPIRATORY: No cough, wheezing + shortness of breath.   CARDIOVASCULAR: Negative for chest pain, and palpitations.   GI: Negative for abdominal discomfort, blood in stools or black stools.  No recent change in bowel habits.   GU:  No history of incontinence.   MUSCLOSKELETAL: No history of joint pain or swelling.  No myalgias.   SKIN: Negative for lesions, rash, and itching.   ENDOCRINE: Negative for cold or heat intolerance, polydipsia or goiter.   PSYCH:  No depression or anxiety symptoms.   NEURO: As Above.   Vital Signs:  BP (!) 166/98   Pulse 88   Ht 6' (1.829 m)   Wt 277 lb (125.6 kg)   SpO2 94%   BMI 37.57 kg/m  Pain Scale: 0 on a scale of 0-10   General: Well appearing, comfortable, on oxygen via [place]  Neurological Exam: MENTAL STATUS including orientation to time, place, person, recent and remote memory, attention span and concentration, language, and fund of knowledge is normal.  Speech is not dysarthric.  CRANIAL NERVES:  Pupils equal round and reactive to light.  Normal conjugate, extra-ocular eye movements in all directions of gaze.  No ptosis.  Face is symmetric. Palate elevates symmetrically.  Tongue is midline.  MOTOR:  No atrophy, fasciculations or abnormal movements.  No pronator drift.  Tone is normal.   Hip flexors are improved to 5-/5, previously 4+/5. Right Upper Extremity:    Left Upper Extremity:    Deltoid  5/5   Deltoid  5/5   Biceps  5/5   Biceps  5/5   Triceps  5/5   Triceps * 5-/5   Wrist extensors  5/5   Wrist extensors  5/5   Wrist flexors  5/5   Wrist flexors  5/5   Finger extensors  5/5   Finger extensors  5/5   Finger flexors  5/5   Finger flexors  5/5   Dorsal interossei  5/5   Dorsal interossei  5/5     Abductor pollicis  5/5   Abductor pollicis  5/5   Tone (Ashworth scale)  0  Tone (Ashworth scale)  0   Right Lower Extremity:    Left Lower Extremity:    Hip flexors * 5-/5   Hip flexors * 5-/5   Hip extensors  5/5   Hip extensors  5/5   Adductor 5/5  Adductor 5/5  Abductor 5/5  Abductor 5/5  Knee flexors  5/5   Knee flexors  5/5   Knee extensors  5/5   Knee extensors  5/5   Dorsiflexors  5/5   Dorsiflexors  5/5   Plantarflexors  5/5   Plantarflexors  5/5   Toe  extensors  5/5   Toe extensors  5/5   Toe flexors  5/5   Toe flexors  5/5   Tone (Ashworth scale)  0  Tone (Ashworth scale)  0   MSRs:  Reflexes are 2+/4 in the upper extremities and absent in the legs  SENSORY:  Intact to vibration throughout.  COORDINATION/GAIT:  Gait appears wide-based, unassisted and stable.  He is able to stand up without using arms to push off, but uses a very wide stance  Data: CT chest 11/12/2016:   1. Overall, the appearance of the chest appears very similar to prior study 11/25/2014, and given the patient's history these findings are likely reflective of chronic sarcoidosis. No new acute findings are noted. 2. Severe dilatation of the pulmonic trunk and main pulmonary  arteries, suggestive of underlying pulmonary arterial hypertension. 3. Cardiomegaly. 4. Additional incidental findings, as above.  NCS/EMG of the right arm and leg 01/20/2017: 1. 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 2. Incidentally, there is a right median neuropathy at or distal to the wrist, consistent with the clinical diagnosis of carpal tunnel syndrome. Overall, these findings are moderate in degree electrically. 3. There is no evidence of a sensorimotor neuropathy or cervical/lumbosacral radiculopathy.   Labs 01/10/2017:  CK 670*, myostitis panel III neg, MG panel neg, aldolase 9.0  Left deltoid muscle biopsy 05/03/2017:  The findings suggest an ongoing  inflammatory process. The inflammation is not necrotizing granulomatous but the histiocytic collections warrant special stains for organisms. Findings of the special stains will be reported in an addendum when available. An autoimmune myositis or sarcoidosis could also be considered. Clinical correlation is essential.   IMPRESSION/PLAN: Sarcoid myopathy affecting the limb girdle distribution, improved proximal strength but still with difficulty climbing stairs He was started on prednisone in June, which was titrated to 40mg  daily in July and has shown improved proximal motor strength.  His range of motion with hip flexion is reduced and explain why climbing stairs is so difficult.   Ideally, I would like to see him full strength before tapering his prednisone but he is very eager to be on a lower dose due to weight gain.  We discussed risks and benefits of tapering prednisone including potential for weakness to get worse.  He expresses understanding and would like to slowly taper prednisone, so will notify Dr. Kavin LeechByrum's, whose assistance with medication management is appreciated.  If his weakness returns, he will need to go back on prednisone 40mg  and likely start steroid-sparing medication.  I offered formal PT to start working on leg strengthening, but he declined this and would like to continue his own exercises  Return to clinic in 2 months  Greater than 50% of this 25 minute visit was spent in counseling, explanation of diagnosis, planning of further management, and coordination of care.   Thank you for allowing me to participate in patient's care.  If I can answer any additional questions, I would be pleased to do so.    Sincerely,    Finlay Godbee K. Allena KatzPatel, DO

## 2017-09-28 NOTE — Patient Instructions (Addendum)
Keep doing your leg strengthening exercises  Return to clinic on October 16th at 11:30am

## 2017-09-29 ENCOUNTER — Telehealth: Payer: Self-pay | Admitting: Licensed Clinical Social Worker

## 2017-09-29 NOTE — Telephone Encounter (Signed)
LCSWA attempted to contact pt to provide follow up regarding eligibility for financial counseling. A message requesting a return call was left.

## 2017-09-30 ENCOUNTER — Ambulatory Visit: Payer: Medicaid Other | Attending: Family Medicine

## 2017-10-02 ENCOUNTER — Ambulatory Visit (HOSPITAL_BASED_OUTPATIENT_CLINIC_OR_DEPARTMENT_OTHER): Payer: Medicaid Other | Attending: Pulmonary Disease | Admitting: Pulmonary Disease

## 2017-10-02 VITALS — Ht 72.0 in | Wt 277.0 lb

## 2017-10-02 DIAGNOSIS — G4733 Obstructive sleep apnea (adult) (pediatric): Secondary | ICD-10-CM

## 2017-10-02 DIAGNOSIS — E662 Morbid (severe) obesity with alveolar hypoventilation: Secondary | ICD-10-CM

## 2017-10-02 DIAGNOSIS — D869 Sarcoidosis, unspecified: Secondary | ICD-10-CM

## 2017-10-02 DIAGNOSIS — J9611 Chronic respiratory failure with hypoxia: Secondary | ICD-10-CM

## 2017-10-02 DIAGNOSIS — I2729 Other secondary pulmonary hypertension: Secondary | ICD-10-CM

## 2017-10-02 DIAGNOSIS — Z9989 Dependence on other enabling machines and devices: Secondary | ICD-10-CM

## 2017-10-02 DIAGNOSIS — J9612 Chronic respiratory failure with hypercapnia: Secondary | ICD-10-CM

## 2017-10-03 ENCOUNTER — Other Ambulatory Visit: Payer: Self-pay | Admitting: Pulmonary Disease

## 2017-10-03 ENCOUNTER — Telehealth: Payer: Self-pay

## 2017-10-03 DIAGNOSIS — D860 Sarcoidosis of lung: Secondary | ICD-10-CM

## 2017-10-03 NOTE — Telephone Encounter (Signed)
PA was initiated for Ventolin HFA 108.  Ventolin HFA 108 (90 Base)MCG/ACT aerosol has been rejected by insurance. Per pharmacy Pro-Air is covered. Please advise as to whether you would like to appeal or change to Pro-Air.

## 2017-10-03 NOTE — Telephone Encounter (Signed)
Please call the patient and ask him which he would prefer for us to do.  If he has tried [employer] in the past and its not as beneficial as Ventolin than I support appealing their decision.

## 2017-10-03 NOTE — Telephone Encounter (Signed)
Patient would like a order for 2 pair of compression socks due to varicose veins.

## 2017-10-03 NOTE — Telephone Encounter (Signed)
Called and spoke with patient, he states that he would like to do the appeal.   RB please advise if we can do this process. Thank you.

## 2017-10-04 NOTE — Telephone Encounter (Signed)
Yes please start process

## 2017-10-04 NOTE — Procedures (Signed)
    Patient Name: Hunter [name], Hunter [name]  Study Date: 10/02/2017   Gender: Male  D.O.B: 1970-08-14  Age (years): [age]  Referring Provider: Coral CeoBrian P Mack NP  Height (inches): 72  Interpreting Physician: Coralyn HellingVineet Karim Aiello MD, ABSM  Weight (lbs): 277  RPSGT: Armen PickupFord, Evelyn  BMI: 38  MRN: [id number]  Neck Size: 15.00  CLINICAL INFORMATION  47 yo male with history of sarcoidosis, obstructive sleep apnea, obesity hypoventilation syndrome, pulmonary hypertension, and chronic hypoxic/hypercapnic respiratory failure. He had sleep study from 12/20/16 showed an AHI of 7.7 and SpO2 low of 74%. He was tried on CPAP, but failed to improve with this. He uses 3 liters oxygen. He presents for a Bipap titration study. SLEEP STUDY TECHNIQUE  As per the AASM Manual for the Scoring of Sleep and Associated Events v2.3 (April 2016) with a hypopnea requiring 4% desaturations. The channels recorded and monitored were frontal, central and occipital EEG, electrooculogram (EOG), submentalis EMG (chin), nasal and oral airflow, thoracic and abdominal wall motion, anterior tibialis EMG, snore microphone, electrocardiogram, and pulse oximetry. Bilevel positive airway pressure (BPAP) was initiated at the beginning of the study and titrated to treat sleep-disordered breathing. MEDICATIONS  Medications self-administered by patient taken the night of the study : TRAMADOL RESPIRATORY PARAMETERS  Optimal IPAP Pressure (cm): 12 AHI at Optimal Pressure (/hr) 0.0  Optimal EPAP Pressure (cm): 8      Overall Minimal O2 (%): 90.0 Minimal O2 at Optimal Pressure (%): 90.0  SLEEP ARCHITECTURE  Start Time: 9:27:17 PM Stop Time: 3:52:02 AM Total Time (min): 384.8 Total Sleep Time (min): 321  Sleep Latency (min): 7.6 Sleep Efficiency (%): 83.4% REM Latency (min): 44.0 WASO (min): 56.2  Stage N1 (%): 5.6% Stage N2 (%): 56.5% Stage N3 (%): 3.3% Stage R (%): 34.6  Supine (%): 15.11 Arousal Index (/hr): 49.2          CARDIAC DATA  The 2 lead EKG  demonstrated sinus rhythm. The mean heart rate was 84.2 beats per minute. Other EKG findings include: None.  LEG MOVEMENT DATA  The total Periodic Limb Movements of Sleep (PLMS) were 0. The PLMS index was 0.0. A PLMS index of <15 is considered normal in adults. IMPRESSIONS  - He did well with Bipap 12/8 cm H2O. He used 3 liters oxygen during the study with Bipap. DIAGNOSIS  - Obstructive Sleep Apnea (327.23 [G47.33 ICD-10]) RECOMMENDATIONS  - Trial of BiPAP therapy on 12/8 cm H2O with 3 liters supplemental oxygen. - He was fitted with a Large size [employer] Full Face Mask Simplus mask and heated humidification. [Electronically signed] 06-09-202019 10:00 PM Coralyn HellingVineet Apostolos Blagg MD, ABSM  Diplomate, American Board of Sleep Medicine  NPI: [id number]

## 2017-10-04 NOTE — Telephone Encounter (Signed)
Called [place] [employer], Initial PA was not done through [employer] therefore was denied. PA initiated via [place] [employer]. Determination is expected in 24 hours. PA # [id number]. Call ID [id number]. Since phone note has been closed, message will be held to follow up on tomorrow.

## 2017-10-05 ENCOUNTER — Other Ambulatory Visit: Payer: Self-pay | Admitting: Family Medicine

## 2017-10-05 ENCOUNTER — Telehealth: Payer: Self-pay | Admitting: *Deleted

## 2017-10-05 NOTE — Progress Notes (Signed)
Written Rx for compression stockings (hose) written today, for patient to pick up.

## 2017-10-05 NOTE — Telephone Encounter (Signed)
-----   Message from Coral CeoBrian P Mack, NP sent at 10/05/2017 10:34 AM EDT -----   ----- Message ----- From: Coralyn HellingSood, Vineet, MD Sent: Apr 04, 202019  10:02 PM EDT To: Kallie LocksNatalie M Stroud, FNP, Coral CeoBrian P Mack, NP

## 2017-10-05 NOTE — Telephone Encounter (Signed)
Spoke with [place] [employer]. PA for Ventolin has been approved until 09/29/2018.   Patient and pharmacy have been notified. Nothing further needed at time of call.

## 2017-10-05 NOTE — Telephone Encounter (Signed)
LMTCB x1 on preferred phone number listed for patient.  

## 2017-10-05 NOTE — Progress Notes (Signed)
Can we please contact the patient and let him know the results of his BiPAP titration study.  See bolded text listed below.  With patient's trial of CPAP.  As well as continued symptoms without improvement on CPAP.  And then BiPAP titration study that showing that he did well on BiPAP.  I think it is reasonable to do a trial of a BiPAP:  Please inform the patient and place order to his DME company for:   - Trial of BiPAP therapy on 12/8 cm H2O with 3 liters supplemental oxygen.  With heated humidification  You can discuss with patient if he enjoyed the mask that he used on the BiPAP titration we can place an order for that mask order (see below) or mask of choice with his DME company.  If patient would like to continue with his mask they used at home that is okay as well.   Mask used BiPAP titration study:  Large size [employer] Full Face Mask Simplus mask   Patient can go ahead and start using BiPAP as ordered.  We will have patient keep follow-up appointment with Dr. Delton CoombesByrum in September.  If patient would like to hold off and continue CPAP therapy until he sees Dr. Delton CoombesByrum in September that is okay as well.  We can place order for BiPAP at that time.

## 2017-10-06 NOTE — Telephone Encounter (Signed)
Called and spoke with patient regarding results.  Informed the patient of results and recommendations today. Pt advised he is currently using BIPAP machine with 3 liters of O2 at bedtime Pt verbalized understanding and denied any questions or concerns at this time.  Nothing further needed.

## 2017-10-06 NOTE — Telephone Encounter (Signed)
Pt is returning call. Cb is [phone].

## 2017-10-11 ENCOUNTER — Other Ambulatory Visit: Payer: Self-pay | Admitting: Family Medicine

## 2017-10-11 ENCOUNTER — Other Ambulatory Visit: Payer: Self-pay | Admitting: Pulmonary Disease

## 2017-10-25 ENCOUNTER — Ambulatory Visit: Payer: Medicaid Other | Admitting: Emergency Medicine

## 2017-10-25 ENCOUNTER — Ambulatory Visit: Payer: Medicaid Other | Admitting: Family Medicine

## 2017-10-31 ENCOUNTER — Encounter (HOSPITAL_COMMUNITY): Admission: RE | Payer: Self-pay | Source: Ambulatory Visit

## 2017-10-31 ENCOUNTER — Ambulatory Visit (HOSPITAL_COMMUNITY): Admission: RE | Admit: 2017-10-31 | Payer: Medicaid Other | Source: Ambulatory Visit | Admitting: Gastroenterology

## 2017-10-31 SURGERY — COLONOSCOPY WITH PROPOFOL
Anesthesia: Monitor Anesthesia Care

## 2017-11-01 ENCOUNTER — Ambulatory Visit (INDEPENDENT_AMBULATORY_CARE_PROVIDER_SITE_OTHER): Payer: Medicaid Other | Admitting: Emergency Medicine

## 2017-11-01 ENCOUNTER — Encounter: Payer: Self-pay | Admitting: Emergency Medicine

## 2017-11-01 ENCOUNTER — Other Ambulatory Visit (HOSPITAL_COMMUNITY): Payer: Self-pay

## 2017-11-01 DIAGNOSIS — D860 Sarcoidosis of lung: Secondary | ICD-10-CM

## 2017-11-01 DIAGNOSIS — G4733 Obstructive sleep apnea (adult) (pediatric): Secondary | ICD-10-CM | POA: Diagnosis not present

## 2017-11-01 MED ORDER — TORSEMIDE 10 MG PO TABS: 20.0000 mg | ORAL_TABLET | Freq: Two times a day (BID) | ORAL | 1 refills | Status: DC

## 2017-11-01 NOTE — Progress Notes (Signed)
Subjective:    Patient ID: Hunter [name], male    DOB: 06/25/1970, 47 y.o.   MRN: [id number]  HPI 47 year old man with a history of biopsy-proven sarcoidosis, skin biopsy 2002, off steroids since '07.  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  He was admitted in September 2018 with right heart failure, significant volume overload and decompensated pulmonary hypertension.  He has had pulmonary function testing 02/12/16 that showed evidence for mixed obstruction and restriction on spirometry, significant restriction on lung volumes.  CT scan performed on 11/11/16 was reviewed by me.  This shows no evidence of mediastinal or hilar lymphadenopathy (no contrast), chronic right-sided pleural thickening with some associated calcification, trace right-sided pleural effusion, chronic areas of linear scarring bilaterally.  No evidence of groundglass, consolidation. He is chronically on 3L/min, was sent home from recent hospitalization with BiPAP, needs a titration study, currently on 15/12. His wt and edema have been stable.   He does have SOB with exertion, even w his O2 on. Has not used BD's  He is in the [employer], is staying at a hotel with their assistance. Medicaid is pending. They have also helped with securing his BiPAP.    ROV 02/14/17 --47 year old man with sarcoidosis, obesity, diastolic dysfunction chronic renal insufficiency.  He has probable obesity hypoventilation syndrome/OSA and we sent him for a split-night sleep study to titrate BiPAP.  Unfortunately he did not have enough events early in the evening to trigger BiPAP titration.  He is currently using 15/12. He feels that he is benefiting from the BiPAP, sleep is better, energy is better. We did a  trial of albuterol > he is unsure that he benefits. He feels side effects, some tremor. He usually wears 3L/min.    ROV 09/20/17 --47 year old male with history of sarcoidosis, obesity, diastolic dysfunction, chronic renal insufficiency (FSGS), obesity hypoventilation/OSA, secondary PAH.  He has been on BiPAP before, more recently titrated for CPAP apparently due to requirements to get coverage through his DME.  Stable chest imaging with most recent CT scan 11/11/2016 showing chronic right-sided pleural thickening and calcification some few scattered areas of chronic pleural-parenchymal interstitial disease without significant infiltrate, honeycombing, traction bronchiectasis, etc.  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  Prednisone was initiated and he has been on it since 6/28, dose recently increased to 40 mg daily given continued weakness and discomfort on 7/17. He can tell a difference on the higher dose prednisone.  He is on O2 at 3L/min.  He reports that he is experiencing less weakness, but still notices difficulty climbing stairs, heavier exertion.  He is using CPAP reliably every night.   ROV 11/01/17 --follow-up visit for 47 year old gentleman with a history of sarcoidosis, obesity, hypertension with diastolic dysfunction and chronic renal insufficiency (FSGS).  He is on positive pressure at night for obesity hypoventilation syndrome OSA with secondary pulmonary hypertension.  He has chronic interstitial disease on chest imaging.  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  We started him on corticosteroids in late June 2019, increased to 40 mg in mid July.  Since last time he had a BiPAP titration study that indicates optimal therapy at 12/8 +3 L/min  oxygen. He still has difficulty getting up from a seated position, has difficulty climbing stairs.  He has been using the BiPAP, feels that the IPAP may be a bit too low compared with his prior CPAP (15 cmH2O was too strong for him).    R heart cath 11/11/16:  PAP 49/20 (30)  PAOP mean 10 PVR 3.6 Wood units Her cardiac output (Fick) 5.57  Review of Systems  Past Medical History:  Diagnosis Date  . Arthritis    feet  . CHF (congestive heart failure) (HCC)   . Dyspnea   . Hypertension   . Nephrotic syndrome   . Pre-diabetes   . Sarcoid   . Sleep apnea      Family History  Problem Relation Age of Onset  . Asthma Sister   . Hypertension Paternal Grandmother   . Colon cancer Neg Hx        Patient doesn't know for sure if his family history has colon, pancreatic, rectal cancer     Social History   Socioeconomic History  . Marital status: Single    Spouse name: Not on file  . Number of children: 0  . Years of education: 35  . Highest education level: Not on file  Occupational History  . Occupation: Not employed  Social Needs  . Financial resource strain: Not on file  . Food insecurity:    Worry: Not on file    Inability: Not on file  . Transportation needs:    Medical: Not on file    Non-medical: Not on file  Tobacco Use  . Smoking status: Current Some Day Smoker    Packs/day: 0.10    Years: 10.00    Pack years: 1.00    Types: Cigarettes  . Smokeless tobacco: Never Used  . Tobacco comment: 2 cigarettes/day 08/05/17  Substance and Sexual Activity  . Alcohol use: Yes    Alcohol/week: 3.0 standard drinks    Types: 3 Cans of beer per week  . Drug use: No  . Sexual activity: Not on file  Lifestyle  . Physical activity:    Days per week: Not on file    Minutes per session: Not on file  . Stress: Not on file  Relationships  . Social connections:    Talks on phone: Not on file    Gets together: Not on file    Attends religious service: Not on file    Active member of club or organization: Not on file    Attends meetings of clubs or organizations: Not on  file    Relationship status: Not on file  . Intimate partner violence:    Fear of current or ex partner: Not on file    Emotionally abused: Not on file    Physically abused: Not on file    Forced sexual activity: Not on file  Other Topics Concern  . Not on file  Social History Narrative   Lives alone.  Currently homeless and staying on the 3rd floor of a hotel with an elevator.     No children.  Education: high school.       No Known Allergies   Outpatient Medications Prior to Visit  Medication Sig Dispense Refill  . albuterol (PROVENTIL HFA;VENTOLIN HFA) 108 (90 Base) MCG/ACT inhaler Inhale 2 puffs into the lungs every 6 (six) hours as needed for wheezing or shortness of breath. 1 Inhaler 5  . ciclopirox (PENLAC) 8 % solution APPLY TOPICALLY AT BEDTIME OVER NAIL AND SURROUNDING SKIN.  APPLY DAILY OVER PREVIOUS COAT. AFTER 7 DAYS, MAY REMOVE WITH ALCOHOL AND CONTINU (Patient taking differently: Apply 1 application topically 3 (three) times daily. ) 6.6 mL 0  . diclofenac sodium (VOLTAREN) 1 % GEL APPLY 2 GRAMS TO FOOT (AND AFFECTED AREAS) 2-4 TIMES DAILY. (Patient taking differently: Apply 2 g topically 2 (two) times daily. ) 100 g 0  . magnesium oxide (MAG-OX) 400 (241.3 Mg) MG tablet Take 1 tablet (400 mg total) every evening by mouth. 90 tablet 2  . metFORMIN (GLUCOPHAGE) 500 MG tablet TAKE ONE TABLET BY MOUTH TWICE DAILY WITH A MEAL (Patient taking differently: Take 500 mg by mouth 2 (two) times daily with a meal. ) 60 tablet 1  . predniSONE (DELTASONE) 20 MG tablet TAKE 2 TABLETS (4OMG) BY MOUTH DAILY WITH FOOD 60 tablet 1  . torsemide (DEMADEX) 10 MG tablet Take 2 tablets (20 mg total) by mouth 2 (two) times daily. 360 tablet 1  . traMADol (ULTRAM) 50 MG tablet Take 1 tablet (50 mg total) by mouth 2 (two) times daily as needed. (Patient taking differently: Take 50 mg by mouth 2 (two) times daily as needed for severe pain. ) 30 tablet 1  . HYDROcodone-acetaminophen (NORCO/VICODIN) 5-325  MG tablet Take 1 tablet by mouth every 6 (six) hours as needed for moderate pain. (Patient not taking: Reported on 10/24/2017) 20 tablet 0   No facility-administered medications prior to visit.         Objective:   Physical Exam Vitals:   11/01/17 1135  BP: 138/84  Pulse: 76  SpO2: 95%  Weight: 288 lb (130.6 kg)  Height: 6' (1.829 m)   Gen: Pleasant, well-nourished, in no distress,  normal affect  ENT: No lesions,  mouth clear,  oropharynx clear, no postnasal drip  Neck: No JVD, no stridor  Lungs: No use of accessory muscles, small lung volumes, no wheeze or crackles.   Cardiovascular: RRR, heart sounds normal, no murmur or gallops, no peripheral edema  Musculoskeletal: No deformities, no cyanosis or clubbing   Neuro: alert, good strength B LE's, 5/5  Skin: Warm, no lesions or rashes     Assessment & Plan:  OSA (obstructive sleep apnea) We will adjust your BiPAP pressures to 13/8 to see if this delivers more comfortable pressure. Bleed 3L/min O2 into your machine  Follow with Dr Delton CoombesByrum in 1 month  Sarcoidosis of lung (HCC) With associated chronic hypoxemic respiratory failure, mixed restriction and obstruction.  Most recent imaging was in October 2018.  Currently on albuterol as needed, supplemental oxygen  Obesity hypoventilation syndrome (HCC) Adjust BiPAp IPAP to 14cm H2O as he feels he is not getting enough pressure.   Inflammatory myopathy I hope that he would regain full muscular strength before we would decrease his prednisone but he has been on for 3 months and its not clear to me that he is at full strength.  He still has difficulty standing from a sitting position.  His lower extremity flexion and extension are 5/5, question an issue with his pelvic girdle.  Question whether we have the correct diagnosis.  I am going to start tapering his prednisone and see what his strength does.  We may need further diagnostic work-up.  If we believe this is sarcoid myopathy  then he may need a steroid sparing agent.  Levy Pupaobert Cuma Polyakov, MD, PhD 11/01/2017, 12:02 PM [facility] [phone] or if no answer [phone]

## 2017-11-01 NOTE — Assessment & Plan Note (Signed)
With associated chronic hypoxemic respiratory failure, mixed restriction and obstruction.  Most recent imaging was in October 2018.  Currently on albuterol as needed, supplemental oxygen

## 2017-11-01 NOTE — Assessment & Plan Note (Signed)
Adjust BiPAp IPAP to 14cm H2O as he feels he is not getting enough pressure.

## 2017-11-01 NOTE — Patient Instructions (Addendum)
We will start decreasing your prednisone.  Please decrease to 30 mg daily for the next 2 weeks.  If you tolerate that change then decrease again to 20 mg daily until next visit. Call our office if you notice any changes in your lower extremity muscle strength while we are adjusting the prednisone Keep albuterol (ProAir air) available to use 2 puffs up to every 4 hours if you needed for shortness of breath. Continue your oxygen at 3 L/min with exertion. We will adjust your BiPAP pressures to 13/8 to see if this delivers more comfortable pressure. Bleed 3L/min O2 into your machine  Follow with Dr Delton CoombesByrum in 1 month

## 2017-11-01 NOTE — Assessment & Plan Note (Signed)
I hope that he would regain full muscular strength before we would decrease his prednisone but he has been on for 3 months and its not clear to me that he is at full strength.  He still has difficulty standing from a sitting position.  His lower extremity flexion and extension are 5/5, question an issue with his pelvic girdle.  Question whether we have the correct diagnosis.  I am going to start tapering his prednisone and see what his strength does.  We may need further diagnostic work-up.  If we believe this is sarcoid myopathy then he may need a steroid sparing agent.

## 2017-11-01 NOTE — Assessment & Plan Note (Signed)
We will adjust your BiPAP pressures to 13/8 to see if this delivers more comfortable pressure. Bleed 3L/min O2 into your machine  Follow with Dr Delton CoombesByrum in 1 month

## 2017-11-07 ENCOUNTER — Ambulatory Visit: Payer: Medicaid Other

## 2017-11-08 ENCOUNTER — Ambulatory Visit (INDEPENDENT_AMBULATORY_CARE_PROVIDER_SITE_OTHER): Payer: Self-pay | Admitting: Family Medicine

## 2017-11-08 ENCOUNTER — Encounter: Payer: Self-pay | Admitting: Family Medicine

## 2017-11-08 VITALS — BP 150/98 | HR 70 | Temp 98.3°F | Ht 72.0 in | Wt 287.0 lb

## 2017-11-08 DIAGNOSIS — R600 Localized edema: Secondary | ICD-10-CM

## 2017-11-08 DIAGNOSIS — I50812 Chronic right heart failure: Secondary | ICD-10-CM

## 2017-11-08 DIAGNOSIS — R609 Edema, unspecified: Secondary | ICD-10-CM

## 2017-11-08 DIAGNOSIS — Z09 Encounter for follow-up examination after completed treatment for conditions other than malignant neoplasm: Secondary | ICD-10-CM

## 2017-11-08 DIAGNOSIS — F4323 Adjustment disorder with mixed anxiety and depressed mood: Secondary | ICD-10-CM

## 2017-11-08 DIAGNOSIS — R7303 Prediabetes: Secondary | ICD-10-CM

## 2017-11-08 DIAGNOSIS — E66812 Obesity, class 2: Secondary | ICD-10-CM

## 2017-11-08 DIAGNOSIS — Z6838 Body mass index (BMI) 38.0-38.9, adult: Secondary | ICD-10-CM

## 2017-11-08 LAB — POCT URINALYSIS DIP (MANUAL ENTRY)
Bilirubin, UA: NEGATIVE
Glucose, UA: NEGATIVE mg/dL
Ketones, POC UA: NEGATIVE mg/dL
Leukocytes, UA: NEGATIVE
Nitrite, UA: NEGATIVE
Protein Ur, POC: 100 mg/dL — AB
Spec Grav, UA: 1.015 (ref 1.010–1.025)
Urobilinogen, UA: 0.2 E.U./dL
pH, UA: 7 (ref 5.0–8.0)

## 2017-11-08 MED ORDER — TORSEMIDE 10 MG PO TABS: 20.0000 mg | ORAL_TABLET | Freq: Two times a day (BID) | ORAL | 1 refills | Status: DC

## 2017-11-08 MED ORDER — ALBUTEROL SULFATE HFA 108 (90 BASE) MCG/ACT IN AERS: 2.0000 | INHALATION_SPRAY | Freq: Four times a day (QID) | RESPIRATORY_TRACT | 5 refills | Status: DC | PRN

## 2017-11-08 MED ORDER — METFORMIN HCL 500 MG PO TABS: 500.0000 mg | ORAL_TABLET | Freq: Two times a day (BID) | ORAL | 1 refills | Status: DC

## 2017-11-08 MED ORDER — MAGNESIUM OXIDE 400 (241.3 MG) MG PO TABS: 400.0000 mg | ORAL_TABLET | Freq: Every evening | ORAL | 2 refills | Status: DC

## 2017-11-08 NOTE — Patient Instructions (Signed)
Heart-Healthy Eating Plan Heart-healthy meal planning includes:  Limiting unhealthy fats.  Increasing healthy fats.  Making other small dietary changes.  You may need to talk with your doctor or a diet specialist (dietitian) to create an eating plan that is right for you. What types of fat should I choose?  Choose healthy fats. These include olive oil and canola oil, flaxseeds, walnuts, almonds, and seeds.  Eat more omega-3 fats. These include salmon, mackerel, sardines, tuna, flaxseed oil, and ground flaxseeds. Try to eat fish at least twice each week.  Limit saturated fats. ? Saturated fats are often found in animal products, such as meats, butter, and cream. ? Plant sources of saturated fats include palm oil, palm kernel oil, and coconut oil.  Avoid foods with partially hydrogenated oils in them. These include stick margarine, some tub margarines, cookies, crackers, and other baked goods. These contain trans fats. What general guidelines do I need to follow?  Check food labels carefully. Identify foods with trans fats or high amounts of saturated fat.  Fill one half of your plate with vegetables and green salads. Eat 4-5 servings of vegetables per day. A serving of vegetables is: ? 1 cup of raw leafy vegetables. ?  cup of raw or cooked cut-up vegetables. ?  cup of vegetable juice.  Fill one fourth of your plate with whole grains. Look for the word "whole" as the first word in the ingredient list.  Fill one fourth of your plate with lean protein foods.  Eat 4-5 servings of fruit per day. A serving of fruit is: ? One medium whole fruit. ?  cup of dried fruit. ?  cup of fresh, frozen, or canned fruit. ?  cup of 100% fruit juice.  Eat more foods that contain soluble fiber. These include apples, broccoli, carrots, beans, peas, and barley. Try to get 20-30 g of fiber per day.  Eat more home-cooked food. Eat less restaurant, buffet, and fast food.  Limit or avoid  alcohol.  Limit foods high in starch and sugar.  Avoid fried foods.  Avoid frying your food. Try baking, boiling, grilling, or broiling it instead. You can also reduce fat by: ? Removing the skin from poultry. ? Removing all visible fats from meats. ? Skimming the fat off of stews, soups, and gravies before serving them. ? Steaming vegetables in water or broth.  Lose weight if you are overweight.  Eat 4-5 servings of nuts, legumes, and seeds per week: ? One serving of dried beans or legumes equals  cup after being cooked. ? One serving of nuts equals 1 ounces. ? One serving of seeds equals  ounce or one tablespoon.  You may need to keep track of how much salt or sodium you eat. This is especially true if you have high blood pressure. Talk with your doctor or dietitian to get more information. What foods can I eat? Grains Breads, including French, white, pita, wheat, raisin, rye, oatmeal, and Italian. Tortillas that are neither fried nor made with lard or trans fat. Low-fat rolls, including hotdog and hamburger buns and English muffins. Biscuits. Muffins. Waffles. Pancakes. Light popcorn. Whole-grain cereals. Flatbread. Melba toast. Pretzels. Breadsticks. Rusks. Low-fat snacks. Low-fat crackers, including oyster, saltine, matzo, graham, animal, and rye. Rice and pasta, including brown rice and pastas that are made with whole wheat. Vegetables All vegetables. Fruits All fruits, but limit coconut. Meats and Other Protein Sources Lean, well-trimmed beef, veal, pork, and lamb. Chicken and turkey without skin. All fish and shellfish.   Wild duck, rabbit, pheasant, and venison. Egg whites or low-cholesterol egg substitutes. Dried beans, peas, lentils, and tofu. Seeds and most nuts. Dairy Low-fat or nonfat cheeses, including ricotta, string, and mozzarella. Skim or 1% milk that is liquid, powdered, or evaporated. Buttermilk that is made with low-fat milk. Nonfat or low-fat  yogurt. Beverages Mineral water. Diet carbonated beverages. Sweets and Desserts Sherbets and fruit ices. Honey, jam, marmalade, jelly, and syrups. Meringues and gelatins. Pure sugar candy, such as hard candy, jelly beans, gumdrops, mints, marshmallows, and small amounts of dark chocolate. Angel food cake. Eat all sweets and desserts in moderation. Fats and Oils Nonhydrogenated (trans-free) margarines. Vegetable oils, including soybean, sesame, sunflower, olive, peanut, safflower, corn, canola, and cottonseed. Salad dressings or mayonnaise made with a vegetable oil. Limit added fats and oils that you use for cooking, baking, salads, and as spreads. Other Cocoa powder. Coffee and tea. All seasonings and condiments. The items listed above may not be a complete list of recommended foods or beverages. Contact your dietitian for more options. What foods are not recommended? Grains Breads that are made with saturated or trans fats, oils, or whole milk. Croissants. Butter rolls. Cheese breads. Sweet rolls. Donuts. Buttered popcorn. Chow mein noodles. High-fat crackers, such as cheese or butter crackers. Meats and Other Protein Sources Fatty meats, such as hotdogs, short ribs, sausage, spareribs, bacon, rib eye roast or steak, and mutton. High-fat deli meats, such as salami and bologna. Caviar. Domestic duck and goose. Organ meats, such as kidney, liver, sweetbreads, and heart. Dairy Cream, sour cream, cream cheese, and creamed cottage cheese. Whole-milk cheeses, including blue (bleu), Monterey Jack, Brie, [place], American, Havarti, Swiss, cheddar, Camembert, and [place]. Whole or 2% milk that is liquid, evaporated, or condensed. Whole buttermilk. Cream sauce or high-fat cheese sauce. Yogurt that is made from whole milk. Beverages Regular sodas and juice drinks with added sugar. Sweets and Desserts Frosting. Pudding. Cookies. Cakes other than angel food cake. Candy that has milk chocolate or white  chocolate, hydrogenated fat, butter, coconut, or unknown ingredients. Buttered syrups. Full-fat ice cream or ice cream drinks. Fats and Oils Gravy that has suet, meat fat, or shortening. Cocoa butter, hydrogenated oils, palm oil, coconut oil, palm kernel oil. These can often be found in baked products, candy, fried foods, nondairy creamers, and whipped toppings. Solid fats and shortenings, including bacon fat, salt pork, lard, and butter. Nondairy cream substitutes, such as coffee creamers and sour cream substitutes. Salad dressings that are made of unknown oils, cheese, or sour cream. The items listed above may not be a complete list of foods and beverages to avoid. Contact your dietitian for more information. This information is not intended to replace advice given to you by your health care provider. Make sure you discuss any questions you have with your health care provider. Document Released: 07/27/2011 Document Revised: 07/03/2015 Document Reviewed: 07/19/2013 [employer] Interactive Patient Education  2018 [employer] DASH Eating Plan DASH stands for "Dietary Approaches to Stop Hypertension." The DASH eating plan is a healthy eating plan that has been shown to reduce high blood pressure (hypertension). It may also reduce your risk for type 2 diabetes, heart disease, and stroke. The DASH eating plan may also help with weight loss. What are tips for following this plan? General guidelines  Avoid eating more than 2,300 mg (milligrams) of salt (sodium) a day. If you have hypertension, you may need to reduce your sodium intake to 1,500 mg a day.  Limit alcohol intake to no more   than 1 drink a day for nonpregnant women and 2 drinks a day for men. One drink equals 12 oz of beer, 5 oz of wine, or 1 oz of hard liquor.  Work with your health care provider to maintain a healthy body weight or to lose weight. Ask what an ideal weight is for you.  Get at least 30 minutes of exercise that causes your  heart to beat faster (aerobic exercise) most days of the week. Activities may include walking, swimming, or biking.  Work with your health care provider or diet and nutrition specialist (dietitian) to adjust your eating plan to your individual calorie needs. Reading food labels  Check food labels for the amount of sodium per serving. Choose foods with less than 5 percent of the Daily Value of sodium. Generally, foods with less than 300 mg of sodium per serving fit into this eating plan.  To find whole grains, look for the word "whole" as the first word in the ingredient list. Shopping  Buy products labeled as "low-sodium" or "no salt added."  Buy fresh foods. Avoid canned foods and premade or frozen meals. Cooking  Avoid adding salt when cooking. Use salt-free seasonings or herbs instead of table salt or sea salt. Check with your health care provider or pharmacist before using salt substitutes.  Do not fry foods. Cook foods using healthy methods such as baking, boiling, grilling, and broiling instead.  Cook with heart-healthy oils, such as olive, canola, soybean, or sunflower oil. Meal planning   Eat a balanced diet that includes: ? 5 or more servings of fruits and vegetables each day. At each meal, try to fill half of your plate with fruits and vegetables. ? Up to 6-8 servings of whole grains each day. ? Less than 6 oz of lean meat, poultry, or fish each day. A 3-oz serving of meat is about the same size as a deck of cards. One egg equals 1 oz. ? 2 servings of low-fat dairy each day. ? A serving of nuts, seeds, or beans 5 times each week. ? Heart-healthy fats. Healthy fats called Omega-3 fatty acids are found in foods such as flaxseeds and coldwater fish, like sardines, salmon, and mackerel.  Limit how much you eat of the following: ? Canned or prepackaged foods. ? Food that is high in trans fat, such as fried foods. ? Food that is high in saturated fat, such as fatty  meat. ? Sweets, desserts, sugary drinks, and other foods with added sugar. ? Full-fat dairy products.  Do not salt foods before eating.  Try to eat at least 2 vegetarian meals each week.  Eat more home-cooked food and less restaurant, buffet, and fast food.  When eating at a restaurant, ask that your food be prepared with less salt or no salt, if possible. What foods are recommended? The items listed may not be a complete list. Talk with your dietitian about what dietary choices are best for you. Grains Whole-grain or whole-wheat bread. Whole-grain or whole-wheat pasta. Brown rice. Oatmeal. Quinoa. Bulgur. Whole-grain and low-sodium cereals. Pita bread. Low-fat, low-sodium crackers. Whole-wheat flour tortillas. Vegetables Fresh or frozen vegetables (raw, steamed, roasted, or grilled). Low-sodium or reduced-sodium tomato and vegetable juice. Low-sodium or reduced-sodium tomato sauce and tomato paste. Low-sodium or reduced-sodium canned vegetables. Fruits All fresh, dried, or frozen fruit. Canned fruit in natural juice (without added sugar). Meat and other protein foods Skinless chicken or turkey. Ground chicken or turkey. Pork with fat trimmed off. Fish and seafood. Egg   whites. Dried beans, peas, or lentils. Unsalted nuts, nut butters, and seeds. Unsalted canned beans. Lean cuts of beef with fat trimmed off. Low-sodium, lean deli meat. Dairy Low-fat (1%) or fat-free (skim) milk. Fat-free, low-fat, or reduced-fat cheeses. Nonfat, low-sodium ricotta or cottage cheese. Low-fat or nonfat yogurt. Low-fat, low-sodium cheese. Fats and oils Soft margarine without trans fats. Vegetable oil. Low-fat, reduced-fat, or light mayonnaise and salad dressings (reduced-sodium). Canola, safflower, olive, soybean, and sunflower oils. Avocado. Seasoning and other foods Herbs. Spices. Seasoning mixes without salt. Unsalted popcorn and pretzels. Fat-free sweets. What foods are not recommended? The items listed  may not be a complete list. Talk with your dietitian about what dietary choices are best for you. Grains Baked goods made with fat, such as croissants, muffins, or some breads. Dry pasta or rice meal packs. Vegetables Creamed or fried vegetables. Vegetables in a cheese sauce. Regular canned vegetables (not low-sodium or reduced-sodium). Regular canned tomato sauce and paste (not low-sodium or reduced-sodium). Regular tomato and vegetable juice (not low-sodium or reduced-sodium). Pickles. Olives. Fruits Canned fruit in a light or heavy syrup. Fried fruit. Fruit in cream or butter sauce. Meat and other protein foods Fatty cuts of meat. Ribs. Fried meat. Bacon. Sausage. Bologna and other processed lunch meats. Salami. Fatback. Hotdogs. Bratwurst. Salted nuts and seeds. Canned beans with added salt. Canned or smoked fish. Whole eggs or egg yolks. Chicken or turkey with skin. Dairy Whole or 2% milk, cream, and half-and-half. Whole or full-fat cream cheese. Whole-fat or sweetened yogurt. Full-fat cheese. Nondairy creamers. Whipped toppings. Processed cheese and cheese spreads. Fats and oils Butter. Stick margarine. Lard. Shortening. Ghee. Bacon fat. Tropical oils, such as coconut, palm kernel, or palm oil. Seasoning and other foods Salted popcorn and pretzels. Onion salt, garlic salt, seasoned salt, table salt, and sea salt. Worcestershire sauce. Tartar sauce. Barbecue sauce. Teriyaki sauce. Soy sauce, including reduced-sodium. Steak sauce. Canned and packaged gravies. Fish sauce. Oyster sauce. Cocktail sauce. Horseradish that you find on the shelf. Ketchup. Mustard. Meat flavorings and tenderizers. Bouillon cubes. Hot sauce and Tabasco sauce. Premade or packaged marinades. Premade or packaged taco seasonings. Relishes. Regular salad dressings. Where to find more information:  National Heart, Lung, and Blood Institute: www.nhlbi.nih.gov  American Heart Association: www.heart.org Summary  The DASH  eating plan is a healthy eating plan that has been shown to reduce high blood pressure (hypertension). It may also reduce your risk for type 2 diabetes, heart disease, and stroke.  With the DASH eating plan, you should limit salt (sodium) intake to 2,300 mg a day. If you have hypertension, you may need to reduce your sodium intake to 1,500 mg a day.  When on the DASH eating plan, aim to eat more fresh fruits and vegetables, whole grains, lean proteins, low-fat dairy, and heart-healthy fats.  Work with your health care provider or diet and nutrition specialist (dietitian) to adjust your eating plan to your individual calorie needs. This information is not intended to replace advice given to you by your health care provider. Make sure you discuss any questions you have with your health care provider. Document Released: 01/14/2011 Document Revised: 01/19/2016 Document Reviewed: 01/19/2016 [employer] Interactive Patient Education  2018 [employer]  

## 2017-11-08 NOTE — Progress Notes (Signed)
Follow Up  Subjective:    Patient ID: Hunter [name], male    DOB: Aug 01, 1970, 47 y.o.   MRN: [id number]   Chief Complaint  Patient presents with  . Follow-up    chronic condition  . weight gain    since being on prednisone   HPI  Hunter [name] is a 47 year old male with a past medical history of Sleep Apnea, Sarcoid, Pre-Diabetes, Nephrotic Syndrome, Hypertension, Dyspnea, CHF, Sarcoidosis, and Arthritis. He is here today for follow up.   Current Status: Since his last office visit, he is doing well. He continues to follow up with Pulmonary and Cardiology regularly. For CHF. He is taking all mediations as prescribed. His anxiety levels have decreased. He continues to struggle with weight gain, which he r/t his daily use of steroids.   He denies fevers, chills, fatigue, recent infections, weight loss, and night sweats. He has not had any headaches, visual changes, dizziness, and falls. No chest pain, and heart palpitations reported. No reports of GI problems such as nausea, vomiting, diarrhea, and constipation. He has no reports of blood in stools, dysuria and hematuria. He denies pain today.   Past Medical History:  Diagnosis Date  . Arthritis    feet  . CHF (congestive heart failure) (HCC)   . Dyspnea   . Hypertension   . Nephrotic syndrome   . Pre-diabetes   . Sarcoid   . Sleep apnea     Family History  Problem Relation Age of Onset  . Asthma Sister   . Hypertension Paternal Grandmother   . Colon cancer Neg Hx        Patient doesn't know for sure if his family history has colon, pancreatic, rectal cancer    Social History   Socioeconomic History  . Marital status: Single    Spouse name: Not on file  . Number of children: 0  . Years of education: 33  . Highest education level: Not on file  Occupational History  . Occupation: Not employed  Social Needs  . Financial resource strain: Not on file  . Food insecurity:    Worry: Not on file    Inability: Not on file  .  Transportation needs:    Medical: Not on file    Non-medical: Not on file  Tobacco Use  . Smoking status: Current Some Day Smoker    Packs/day: 0.10    Years: 10.00    Pack years: 1.00    Types: Cigarettes  . Smokeless tobacco: Never Used  . Tobacco comment: 2 cigarettes/day 08/05/17  Substance and Sexual Activity  . Alcohol use: Yes    Alcohol/week: 3.0 standard drinks    Types: 3 Cans of beer per week  . Drug use: No  . Sexual activity: Not on file  Lifestyle  . Physical activity:    Days per week: Not on file    Minutes per session: Not on file  . Stress: Not on file  Relationships  . Social connections:    Talks on phone: Not on file    Gets together: Not on file    Attends religious service: Not on file    Active member of club or organization: Not on file    Attends meetings of clubs or organizations: Not on file    Relationship status: Not on file  . Intimate partner violence:    Fear of current or ex partner: Not on file    Emotionally abused: Not on file  Physically abused: Not on file    Forced sexual activity: Not on file  Other Topics Concern  . Not on file  Social History Narrative   Lives alone.  Currently homeless and staying on the 3rd floor of a hotel with an elevator.     No children.  Education: high school.      Past Surgical History:  Procedure Laterality Date  . MUSCLE BIOPSY Right 05/03/2017   Procedure: RIGHT THIGH MUSCLE BIOPSY/RIGHT DELTOID MUSCLE BIOPSY;  Surgeon: Manus Rudd, MD;  Location: [facility] OR;  Service: General;  Laterality: Right;  . RIGHT HEART CATH N/A 11/11/2016   Procedure: RIGHT HEART CATH;  Surgeon: Laurey Morale, MD;  Location: [facility] INVASIVE CV LAB;  Service: Cardiovascular;  Laterality: N/A;    Immunization History  Administered Date(s) Administered  . PPD Test 03/21/2006  . Pneumococcal Polysaccharide-23 03/21/2006    Current Meds  Medication Sig  . albuterol (PROVENTIL HFA;VENTOLIN HFA) 108 (90 Base) MCG/ACT  inhaler Inhale 2 puffs into the lungs every 6 (six) hours as needed for wheezing or shortness of breath.  . ciclopirox (PENLAC) 8 % solution APPLY TOPICALLY AT BEDTIME OVER NAIL AND SURROUNDING SKIN. APPLY DAILY OVER PREVIOUS COAT. AFTER 7 DAYS, MAY REMOVE WITH ALCOHOL AND CONTINU (Patient taking differently: Apply 1 application topically 3 (three) times daily. )  . magnesium oxide (MAG-OX) 400 (241.3 Mg) MG tablet Take 1 tablet (400 mg total) by mouth every evening.  . metFORMIN (GLUCOPHAGE) 500 MG tablet Take 1 tablet (500 mg total) by mouth 2 (two) times daily with a meal.  . predniSONE (DELTASONE) 20 MG tablet TAKE 2 TABLETS (4OMG) BY MOUTH DAILY WITH FOOD  . torsemide (DEMADEX) 10 MG tablet Take 2 tablets (20 mg total) by mouth 2 (two) times daily.  . traMADol (ULTRAM) 50 MG tablet Take 1 tablet (50 mg total) by mouth 2 (two) times daily as needed. (Patient taking differently: Take 50 mg by mouth 2 (two) times daily as needed for severe pain. )  . [DISCONTINUED] albuterol (PROVENTIL HFA;VENTOLIN HFA) 108 (90 Base) MCG/ACT inhaler Inhale 2 puffs into the lungs every 6 (six) hours as needed for wheezing or shortness of breath.  . [DISCONTINUED] magnesium oxide (MAG-OX) 400 (241.3 Mg) MG tablet Take 1 tablet (400 mg total) every evening by mouth.  . [DISCONTINUED] metFORMIN (GLUCOPHAGE) 500 MG tablet TAKE ONE TABLET BY MOUTH TWICE DAILY WITH A MEAL (Patient taking differently: Take 500 mg by mouth 2 (two) times daily with a meal. )  . [DISCONTINUED] torsemide (DEMADEX) 10 MG tablet Take 2 tablets (20 mg total) by mouth 2 (two) times daily.    No Known Allergies   BP (!) 150/98 (BP Location: Left Arm, Patient Position: Sitting, Cuff Size: Large)   Pulse 70   Temp 98.3 F (36.8 C) (Oral)   Ht 6' (1.829 m)   Wt 287 lb (130.2 kg)   SpO2 96%   BMI 38.92 kg/m   Review of Systems  Constitutional: Negative.   HENT: Negative.   Respiratory: Positive for cough and shortness of breath.         Continuous oxygen.   Cardiovascular: Negative.   Gastrointestinal: Positive for abdominal distention (Obese).  Genitourinary: Negative.   Musculoskeletal: Negative.   Skin: Negative.   Neurological: Negative.   Psychiatric/Behavioral: Negative.    Objective:   Physical Exam  Constitutional: He is oriented to person, place, and time. He appears well-developed and well-nourished.  HENT:  Head: Normocephalic and atraumatic.  Cardiovascular:  Normal rate, regular rhythm, normal heart sounds and intact distal pulses.  Pulmonary/Chest: Effort normal and breath sounds normal.  Abdominal: Soft. Bowel sounds are normal. He exhibits distension (Obese).  Musculoskeletal: Normal range of motion.  Neurological: He is alert and oriented to person, place, and time.  Skin: Skin is warm and dry.  Psychiatric: He has a normal mood and affect. His behavior is normal. Judgment and thought content normal.  Nursing note and vitals reviewed.  Assessment & Plan:   1. Chronic right-sided congestive heart failure (HCC) Stable. He continues with home oxygen. He is in no distress today.  - albuterol (PROVENTIL HFA;VENTOLIN HFA) 108 (90 Base) MCG/ACT inhaler; Inhale 2 puffs into the lungs every 6 (six) hours as needed for wheezing or shortness of breath.  Dispense: 1 Inhaler; Refill: 5 - torsemide (DEMADEX) 10 MG tablet; Take 2 tablets (20 mg total) by mouth 2 (two) times daily.  Dispense: 360 tablet; Refill: 1  2. Class 2 severe obesity due to excess calories with serious comorbidity and body mass index (BMI) of 38.0 to 38.9 in adult [facility]) Body mass index is 38.92 kg/m. Goal BMI  is <30. Encouraged efforts to reduce weight include engaging in physical activity as tolerated with goal of 150 minutes per week. Improve dietary choices and eat a meal regimen consistent with a Mediterranean or DASH diet. Reduce simple carbohydrates. Do not skip meals and eat healthy snacks throughout the day to avoid over-eating at  dinner. Set a goal weight loss that is achievable for you. - Referral to Nutrition and Diabetes Services  3. Peripheral edema Mild peripheral edema around ankles. Continue Torsemide as prescribed.  - torsemide (DEMADEX) 10 MG tablet; Take 2 tablets (20 mg total) by mouth 2 (two) times daily.  Dispense: 360 tablet; Refill: 1  4. Adjustment disorder with mixed anxiety and depressed mood Improved. Continue to monitor.   5. Prediabetes Hgb A1c is at normal level of 5.3 today. He will continue to decrease foods/beverages high in sugars and carbs and follow Heart Healthy or DASH diet. Increase physical activity to at least 30 minutes cardio exercise daily.  - metFORMIN (GLUCOPHAGE) 500 MG tablet; Take 1 tablet (500 mg total) by mouth 2 (two) times daily with a meal.  Dispense: 60 tablet; Refill: 1  6. Hypomagnesemia - magnesium oxide (MAG-OX) 400 (241.3 Mg) MG tablet; Take 1 tablet (400 mg total) by mouth every evening.  Dispense: 90 tablet; Refill: 2  7. Follow up He will follow up in 6 months.  - POCT urinalysis dipstick  Meds ordered this encounter  Medications  . albuterol (PROVENTIL HFA;VENTOLIN HFA) 108 (90 Base) MCG/ACT inhaler    Sig: Inhale 2 puffs into the lungs every 6 (six) hours as needed for wheezing or shortness of breath.    Dispense:  1 Inhaler    Refill:  5  . magnesium oxide (MAG-OX) 400 (241.3 Mg) MG tablet    Sig: Take 1 tablet (400 mg total) by mouth every evening.    Dispense:  90 tablet    Refill:  2  . metFORMIN (GLUCOPHAGE) 500 MG tablet    Sig: Take 1 tablet (500 mg total) by mouth 2 (two) times daily with a meal.    Dispense:  60 tablet    Refill:  1  . torsemide (DEMADEX) 10 MG tablet    Sig: Take 2 tablets (20 mg total) by mouth 2 (two) times daily.    Dispense:  360 tablet    Refill:  1    Pharmacy reports out of Demadex 20 mg    Referral Orders     Referral to Nutrition and Diabetes Services   Raliegh Ip,  MSN, FNP-C Patient Care  [facility] [address] [place], [place] [address] [phone]

## 2017-11-09 ENCOUNTER — Telehealth: Payer: Self-pay | Admitting: Emergency Medicine

## 2017-11-09 DIAGNOSIS — G4733 Obstructive sleep apnea (adult) (pediatric): Secondary | ICD-10-CM

## 2017-11-09 NOTE — Telephone Encounter (Signed)
If that is the case then have them start new biPAP based on these orders. He has a biPAp titration study that confirmed he needs BiPAP instead of CPAP.

## 2017-11-09 NOTE — Telephone Encounter (Signed)
Called and spoke with Melissa from [facility] and order placed for new bipap.  Nothing further needed

## 2017-11-09 NOTE — Telephone Encounter (Signed)
We sent an order to [facility] to have the pt's BiPAP pressures adjusted. Per Melissa with [facility], he is on CPAP not BiPAP.  RB - please advise. Thanks.

## 2017-11-10 ENCOUNTER — Encounter (HOSPITAL_COMMUNITY): Payer: Medicaid Other

## 2017-11-16 ENCOUNTER — Telehealth: Payer: Self-pay | Admitting: Family Medicine

## 2017-11-16 ENCOUNTER — Ambulatory Visit: Payer: Self-pay | Attending: Family Medicine

## 2017-11-16 MED FILL — ALBUTEROL SULFATE HFA 108 (: 108 (90 BAS | 26 days supply | Qty: 18 | Fill #0

## 2017-11-16 MED FILL — metFORMIN HCL 500 MG TABS: 500 | 30 days supply | Qty: 60 | Fill #0

## 2017-11-16 NOTE — Telephone Encounter (Signed)
Pt called to speak with you, since he apply for CAFA on 11/16/17 and he need the C-path equipment, [facility] and the manager want to speak with you or someone that will help him get the equipment an no cost. Please follow up

## 2017-11-23 ENCOUNTER — Ambulatory Visit: Payer: Medicaid Other | Admitting: Neurology

## 2017-11-28 ENCOUNTER — Telehealth: Payer: Self-pay | Admitting: Licensed Clinical Social Worker

## 2017-11-28 NOTE — Telephone Encounter (Signed)
Call placed to patient to inform him of approval for CAFA 100%. No additional concerns noted.

## 2017-11-30 ENCOUNTER — Encounter: Payer: Self-pay | Admitting: Registered"

## 2017-11-30 ENCOUNTER — Encounter: Payer: Self-pay | Attending: Family Medicine | Admitting: Registered"

## 2017-11-30 DIAGNOSIS — E669 Obesity, unspecified: Secondary | ICD-10-CM

## 2017-11-30 DIAGNOSIS — Z713 Dietary counseling and surveillance: Secondary | ICD-10-CM | POA: Insufficient documentation

## 2017-11-30 DIAGNOSIS — Z6838 Body mass index (BMI) 38.0-38.9, adult: Secondary | ICD-10-CM | POA: Insufficient documentation

## 2017-11-30 NOTE — Progress Notes (Signed)
Medical Nutrition Therapy:  Appt start time: 11:05 end time:  12:16.   Assessment:  Primary concerns today: Pt states he has recently gained weight from prednisone due to sarcoidosis; went from 244-288 lbs since starting medications less than 2 months.   Pt expectations: blueprint of how to lose weight and burn fat, plan to reduce weight gain quickly  Pt states he has been sleeping with oxygen machine which has helped with resting. Pt states he has leg weakness and unable to climb steps.   Pt states he is looking to find ways to eat more healthy to help with moving forward. Pt states he knows he needs to eat better. Pt states he wants to keep metabolism high. Pt reports fluid restriction: no more than 2 L/day; pt states it is impossible to adhere to it. Pt states he relies on what [employer] provides for breakfast. 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 Pt states he typically works 4:30am-9am (5 hrs), 1pm-6pm (5 hrs), and 9 pm-11pm (2 hrs) about 7 days/week. Pt states he tries to sleep during his down time.   Pt states he likes to eat until he is full and does not want to waste food. Pt states he feels being bloated is a side effect of medications. Pt states he walks in shopping centers sometimes for physical activity. Pt states he will be able to prepare meals when he has his own place of living again; currently unable to keep food in places other than his car.    Preferred Learning Style:   No preference indicated   Learning Readiness:   Ready  Change in progress   MEDICATIONS: See list   DIETARY INTAKE:  Usual eating pattern includes 3 meals and 0-1 snacks per day.  Everyday foods include sub sandwich, fruit, and chinese food.  Avoided foods include soda, juice.    24-hr recall:  B ( AM): 1 c coffee (10 oz) + 1/2 sandwich (ham and cheese)  Snk ( AM): none  L ( PM): burger + onion rings or 1/2 sub sandwich Snk ( PM): sometimes fruit D ( PM):  chinese restaurant-chicken wings + pork fried rice + curried noodles Snk ( PM): none Beverages: water, diluted juice, beer (12-pack/week)  Usual physical activity: none stated  Estimated energy needs: 2000 calories 225 g carbohydrates 150 g protein 56 g fat  Progress Towards Goal(s):  In progress.   Nutritional Diagnosis:  NB-2.4 Impaired ability to prepare foods/meals As related to current living arrangements.  As evidenced by pt report of reliance on convenience foods and pre-prepared meals.    Intervention:  Nutrition education and counseling. Pt was educated and counseled on the benefits of eating to nourish body, MyPlate, adequate sleep, metabolism, physical activity, and importance of adhering to fluid restrictions. Pt was in agreement with goals listed.  Goals: - Aim to have snacks between meals on carbohydrates + protein such as fruit + nuts, or cheese + fruit, peanut butter + crackers, or cheese + crackers, etc.  - Try to reduce chinese food intake and replace with salad + protein + baked potato from [facility].  - Aim to eat well-balanced meals using handout as guide.   Teaching Method Utilized:  Visual Auditory Hands on  Handouts given during visit include:  My Plate  Barriers to learning/adherence to lifestyle change: financial constraints and limited ability to store and prepare meals due to housing  Demonstrated degree of understanding via:  Teach Back  Monitoring/Evaluation:  Dietary intake, exercise, and body weight in 1 month(s).

## 2017-11-30 NOTE — Patient Instructions (Signed)
-   Aim to have snacks between meals on carbohydrates + protein such as fruit + nuts, or cheese + fruit, peanut butter + crackers, or cheese + crackers, etc.   - Try to reduce chinese food intake and replace with salad + protein + baked potato from [facility].   - Aim to eat well-balanced meals using handout as guide.

## 2017-12-06 ENCOUNTER — Emergency Department (HOSPITAL_COMMUNITY): Payer: Self-pay

## 2017-12-06 ENCOUNTER — Inpatient Hospital Stay (HOSPITAL_COMMUNITY)
Admission: EM | Admit: 2017-12-06 | Discharge: 2017-12-11 | DRG: 304 | Disposition: A | Discharge status: Home / Self Care | Payer: Self-pay | Attending: Internal Medicine | Admitting: Internal Medicine

## 2017-12-06 ENCOUNTER — Encounter (HOSPITAL_COMMUNITY): Payer: Self-pay | Admitting: *Deleted

## 2017-12-06 ENCOUNTER — Other Ambulatory Visit: Payer: Self-pay

## 2017-12-06 ENCOUNTER — Telehealth: Payer: Self-pay

## 2017-12-06 DIAGNOSIS — J069 Acute upper respiratory infection, unspecified: Secondary | ICD-10-CM | POA: Diagnosis present

## 2017-12-06 DIAGNOSIS — N049 Nephrotic syndrome with unspecified morphologic changes: Secondary | ICD-10-CM | POA: Diagnosis present

## 2017-12-06 DIAGNOSIS — M109 Gout, unspecified: Secondary | ICD-10-CM | POA: Diagnosis present

## 2017-12-06 DIAGNOSIS — Z7984 Long term (current) use of oral hypoglycemic drugs: Secondary | ICD-10-CM

## 2017-12-06 DIAGNOSIS — I5033 Acute on chronic diastolic (congestive) heart failure: Secondary | ICD-10-CM | POA: Diagnosis present

## 2017-12-06 DIAGNOSIS — E8729 Other acidosis: Secondary | ICD-10-CM

## 2017-12-06 DIAGNOSIS — E662 Morbid (severe) obesity with alveolar hypoventilation: Secondary | ICD-10-CM | POA: Diagnosis present

## 2017-12-06 DIAGNOSIS — I248 Other forms of acute ischemic heart disease: Secondary | ICD-10-CM | POA: Diagnosis present

## 2017-12-06 DIAGNOSIS — E1122 Type 2 diabetes mellitus with diabetic chronic kidney disease: Secondary | ICD-10-CM | POA: Diagnosis present

## 2017-12-06 DIAGNOSIS — I50810 Right heart failure, unspecified: Secondary | ICD-10-CM | POA: Diagnosis present

## 2017-12-06 DIAGNOSIS — I130 Hypertensive heart and chronic kidney disease with heart failure and stage 1 through stage 4 chronic kidney disease, or unspecified chronic kidney disease: Secondary | ICD-10-CM | POA: Diagnosis present

## 2017-12-06 DIAGNOSIS — N179 Acute kidney failure, unspecified: Secondary | ICD-10-CM | POA: Diagnosis present

## 2017-12-06 DIAGNOSIS — Z6837 Body mass index (BMI) 37.0-37.9, adult: Secondary | ICD-10-CM

## 2017-12-06 DIAGNOSIS — T502X5A Adverse effect of carbonic-anhydrase inhibitors, benzothiadiazides and other diuretics, initial encounter: Secondary | ICD-10-CM | POA: Diagnosis present

## 2017-12-06 DIAGNOSIS — R778 Other specified abnormalities of plasma proteins: Secondary | ICD-10-CM | POA: Diagnosis present

## 2017-12-06 DIAGNOSIS — Z9981 Dependence on supplemental oxygen: Secondary | ICD-10-CM

## 2017-12-06 DIAGNOSIS — Z79899 Other long term (current) drug therapy: Secondary | ICD-10-CM

## 2017-12-06 DIAGNOSIS — D860 Sarcoidosis of lung: Secondary | ICD-10-CM | POA: Diagnosis present

## 2017-12-06 DIAGNOSIS — J209 Acute bronchitis, unspecified: Secondary | ICD-10-CM | POA: Diagnosis present

## 2017-12-06 DIAGNOSIS — Z791 Long term (current) use of non-steroidal anti-inflammatories (NSAID): Secondary | ICD-10-CM

## 2017-12-06 DIAGNOSIS — R7989 Other specified abnormal findings of blood chemistry: Secondary | ICD-10-CM | POA: Diagnosis present

## 2017-12-06 DIAGNOSIS — E790 Hyperuricemia without signs of inflammatory arthritis and tophaceous disease: Secondary | ICD-10-CM

## 2017-12-06 DIAGNOSIS — J9611 Chronic respiratory failure with hypoxia: Secondary | ICD-10-CM | POA: Diagnosis present

## 2017-12-06 DIAGNOSIS — Z7952 Long term (current) use of systemic steroids: Secondary | ICD-10-CM

## 2017-12-06 DIAGNOSIS — I10 Essential (primary) hypertension: Secondary | ICD-10-CM | POA: Diagnosis present

## 2017-12-06 DIAGNOSIS — I2721 Secondary pulmonary arterial hypertension: Secondary | ICD-10-CM | POA: Diagnosis present

## 2017-12-06 DIAGNOSIS — J984 Other disorders of lung: Secondary | ICD-10-CM | POA: Diagnosis present

## 2017-12-06 DIAGNOSIS — Z720 Tobacco use: Secondary | ICD-10-CM | POA: Diagnosis present

## 2017-12-06 DIAGNOSIS — I160 Hypertensive urgency: Principal | ICD-10-CM | POA: Diagnosis present

## 2017-12-06 DIAGNOSIS — E872 Acidosis: Secondary | ICD-10-CM | POA: Diagnosis present

## 2017-12-06 DIAGNOSIS — I5082 Biventricular heart failure: Secondary | ICD-10-CM | POA: Diagnosis present

## 2017-12-06 DIAGNOSIS — M79671 Pain in right foot: Secondary | ICD-10-CM | POA: Diagnosis present

## 2017-12-06 DIAGNOSIS — N189 Chronic kidney disease, unspecified: Secondary | ICD-10-CM | POA: Diagnosis present

## 2017-12-06 DIAGNOSIS — Z9114 Patient's other noncompliance with medication regimen: Secondary | ICD-10-CM

## 2017-12-06 DIAGNOSIS — F17210 Nicotine dependence, cigarettes, uncomplicated: Secondary | ICD-10-CM | POA: Diagnosis present

## 2017-12-06 LAB — CBC WITH DIFFERENTIAL/PLATELET
Abs Immature Granulocytes: 0.07 10*3/uL (ref 0.00–0.07)
Basophils Absolute: 0 10*3/uL (ref 0.0–0.1)
Basophils Relative: 1 %
Eosinophils Absolute: 0.1 10*3/uL (ref 0.0–0.5)
Eosinophils Relative: 2 %
HEMATOCRIT: 47 % (ref 39.0–52.0)
HEMOGLOBIN: 14 g/dL (ref 13.0–17.0)
Immature Granulocytes: 1 %
LYMPHS ABS: 0.8 10*3/uL (ref 0.7–4.0)
LYMPHS PCT: 11 %
MCH: 29.1 pg (ref 26.0–34.0)
MCHC: 29.8 g/dL — AB (ref 30.0–36.0)
MCV: 97.7 fL (ref 80.0–100.0)
MONO ABS: 0.8 10*3/uL (ref 0.1–1.0)
Monocytes Relative: 11 %
Neutro Abs: 5.5 10*3/uL (ref 1.7–7.7)
Neutrophils Relative %: 74 %
Platelets: 145 10*3/uL — ABNORMAL LOW (ref 150–400)
RBC: 4.81 MIL/uL (ref 4.22–5.81)
RDW: 13.4 % (ref 11.5–15.5)
WBC: 7.3 10*3/uL (ref 4.0–10.5)
nRBC: 0 % (ref 0.0–0.2)

## 2017-12-06 LAB — BLOOD GAS, VENOUS
Acid-Base Excess: 7.9 mmol/L — ABNORMAL HIGH (ref 0.0–2.0)
Bicarbonate: 35.9 mmol/L — ABNORMAL HIGH (ref 20.0–28.0)
O2 SAT: 59.5 %
PATIENT TEMPERATURE: 98.6
pCO2, Ven: 67.1 mmHg — ABNORMAL HIGH (ref 44.0–60.0)
pH, Ven: 7.348 (ref 7.250–7.430)
pO2, Ven: 33.5 mmHg (ref 32.0–45.0)

## 2017-12-06 LAB — COMPREHENSIVE METABOLIC PANEL
ALT: 26 U/L (ref 0–44)
ANION GAP: 6 (ref 5–15)
AST: 27 U/L (ref 15–41)
Albumin: 3.5 g/dL (ref 3.5–5.0)
Alkaline Phosphatase: 64 U/L (ref 38–126)
BILIRUBIN TOTAL: 1.3 mg/dL — AB (ref 0.3–1.2)
BUN: 16 mg/dL (ref 6–20)
CO2: 36 mmol/L — ABNORMAL HIGH (ref 22–32)
Calcium: 8.3 mg/dL — ABNORMAL LOW (ref 8.9–10.3)
Chloride: 98 mmol/L (ref 98–111)
Creatinine, Ser: 0.91 mg/dL (ref 0.61–1.24)
Glucose, Bld: 91 mg/dL (ref 70–99)
Potassium: 4 mmol/L (ref 3.5–5.1)
Sodium: 140 mmol/L (ref 135–145)
TOTAL PROTEIN: 6.8 g/dL (ref 6.5–8.1)

## 2017-12-06 LAB — TROPONIN I
TROPONIN I: 0.03 ng/mL — AB (ref <0.03)
Troponin I: 0.03 ng/mL (ref <0.03)
Troponin I: 0.04 ng/mL (ref <0.03)

## 2017-12-06 LAB — PROTIME-INR
INR: 0.8
Prothrombin Time: 11 seconds — ABNORMAL LOW (ref 11.4–15.2)

## 2017-12-06 LAB — CBG MONITORING, ED: Glucose-Capillary: 244 mg/dL — ABNORMAL HIGH (ref 70–99)

## 2017-12-06 LAB — RAPID URINE DRUG SCREEN, HOSP PERFORMED
Amphetamines: NOT DETECTED
Barbiturates: NOT DETECTED
Benzodiazepines: NOT DETECTED
Cocaine: NOT DETECTED
Opiates: POSITIVE — AB
Tetrahydrocannabinol: NOT DETECTED

## 2017-12-06 LAB — URIC ACID: URIC ACID, SERUM: 9.6 mg/dL — AB (ref 3.7–8.6)

## 2017-12-06 LAB — BRAIN NATRIURETIC PEPTIDE: B NATRIURETIC PEPTIDE 5: 72.6 pg/mL (ref 0.0–100.0)

## 2017-12-06 LAB — MAGNESIUM: Magnesium: 2.1 mg/dL (ref 1.7–2.4)

## 2017-12-06 MED ORDER — INSULIN ASPART 100 UNIT/ML ~~LOC~~ SOLN
0.0000 [IU] | Freq: Every day | SUBCUTANEOUS | Status: DC
  Administered 2017-12-06: 2 [IU] via SUBCUTANEOUS
  Filled 2017-12-06: qty 1

## 2017-12-06 MED ORDER — MORPHINE SULFATE (PF) 2 MG/ML IV SOLN
2.0000 mg | INTRAVENOUS | Status: DC | PRN
  Administered 2017-12-07 (×3): 2 mg via INTRAVENOUS
  Filled 2017-12-06 (×3): qty 1

## 2017-12-06 MED ORDER — OXYCODONE-ACETAMINOPHEN 5-325 MG PO TABS
1.0000 | ORAL_TABLET | Freq: Once | ORAL | Status: AC
  Administered 2017-12-06: 1 via ORAL
  Filled 2017-12-06: qty 1

## 2017-12-06 MED ORDER — LEVALBUTEROL HCL 1.25 MG/0.5ML IN NEBU: 1.2500 mg | INHALATION_SOLUTION | Freq: Four times a day (QID) | RESPIRATORY_TRACT | Status: DC

## 2017-12-06 MED ORDER — NITROGLYCERIN 0.4 MG SL SUBL: 0.4000 mg | SUBLINGUAL_TABLET | SUBLINGUAL | Status: DC | PRN

## 2017-12-06 MED ORDER — ACETAMINOPHEN 325 MG PO TABS: 650.0000 mg | ORAL_TABLET | ORAL | Status: DC | PRN

## 2017-12-06 MED ORDER — MAGNESIUM OXIDE 400 (241.3 MG) MG PO TABS
400.0000 mg | ORAL_TABLET | Freq: Every evening | ORAL | Status: DC
  Administered 2017-12-07 – 2017-12-10 (×5): 400 mg via ORAL
  Filled 2017-12-06 (×5): qty 1

## 2017-12-06 MED ORDER — ASPIRIN EC 325 MG PO TBEC
325.0000 mg | DELAYED_RELEASE_TABLET | Freq: Every day | ORAL | Status: DC
  Administered 2017-12-06 – 2017-12-11 (×6): 325 mg via ORAL
  Filled 2017-12-06 (×7): qty 1

## 2017-12-06 MED ORDER — IPRATROPIUM BROMIDE 0.02 % IN SOLN: 0.5000 mg | Freq: Four times a day (QID) | RESPIRATORY_TRACT | Status: DC

## 2017-12-06 MED ORDER — TORSEMIDE 20 MG PO TABS
40.0000 mg | ORAL_TABLET | Freq: Every day | ORAL | Status: DC
  Administered 2017-12-07: 40 mg via ORAL
  Filled 2017-12-06: qty 2

## 2017-12-06 MED ORDER — METHYLPREDNISOLONE SODIUM SUCC 125 MG IJ SOLR
60.0000 mg | Freq: Every day | INTRAMUSCULAR | Status: DC
  Administered 2017-12-07: 60 mg via INTRAVENOUS
  Filled 2017-12-06: qty 2

## 2017-12-06 MED ORDER — FUROSEMIDE 10 MG/ML IJ SOLN
40.0000 mg | Freq: Once | INTRAMUSCULAR | Status: AC
  Administered 2017-12-06: 40 mg via INTRAVENOUS
  Filled 2017-12-06: qty 4

## 2017-12-06 MED ORDER — NICOTINE 21 MG/24HR TD PT24
21.0000 mg | MEDICATED_PATCH | Freq: Every day | TRANSDERMAL | Status: DC
  Administered 2017-12-08 – 2017-12-11 (×5): 21 mg via TRANSDERMAL
  Filled 2017-12-06 (×8): qty 1

## 2017-12-06 MED ORDER — ONDANSETRON HCL 4 MG/2ML IJ SOLN: 4.0000 mg | Freq: Four times a day (QID) | INTRAMUSCULAR | Status: DC | PRN

## 2017-12-06 MED ORDER — IOPAMIDOL (ISOVUE-370) INJECTION 76%
INTRAVENOUS | Status: AC
  Filled 2017-12-06: qty 100

## 2017-12-06 MED ORDER — IOPAMIDOL (ISOVUE-370) INJECTION 76%
100.0000 mL | Freq: Once | INTRAVENOUS | Status: AC | PRN
  Administered 2017-12-06: 100 mL via INTRAVENOUS

## 2017-12-06 MED ORDER — METHYLPREDNISOLONE SODIUM SUCC 125 MG IJ SOLR
125.0000 mg | Freq: Once | INTRAMUSCULAR | Status: AC
  Administered 2017-12-06: 125 mg via INTRAVENOUS
  Filled 2017-12-06: qty 2

## 2017-12-06 MED ORDER — HYDRALAZINE HCL 20 MG/ML IJ SOLN
5.0000 mg | INTRAMUSCULAR | Status: DC | PRN
  Administered 2017-12-07: 5 mg via INTRAVENOUS
  Filled 2017-12-06: qty 1

## 2017-12-06 MED ORDER — ALPRAZOLAM 0.25 MG PO TABS: 0.2500 mg | ORAL_TABLET | Freq: Two times a day (BID) | ORAL | Status: DC | PRN

## 2017-12-06 MED ORDER — CICLOPIROX 8 % EX SOLN: 1.0000 "application " | Freq: Three times a day (TID) | CUTANEOUS | Status: DC

## 2017-12-06 MED ORDER — ENOXAPARIN SODIUM 40 MG/0.4ML ~~LOC~~ SOLN
40.0000 mg | SUBCUTANEOUS | Status: DC
  Filled 2017-12-06: qty 0.4

## 2017-12-06 MED ORDER — AMLODIPINE BESYLATE 10 MG PO TABS
10.0000 mg | ORAL_TABLET | Freq: Every day | ORAL | Status: DC
  Administered 2017-12-06 – 2017-12-11 (×6): 10 mg via ORAL
  Filled 2017-12-06 (×7): qty 1

## 2017-12-06 MED ORDER — SODIUM CHLORIDE 0.9 % IJ SOLN
INTRAMUSCULAR | Status: AC
  Filled 2017-12-06: qty 50

## 2017-12-06 MED ORDER — OXYCODONE-ACETAMINOPHEN 5-325 MG PO TABS: 1.0000 | ORAL_TABLET | Freq: Four times a day (QID) | ORAL | Status: DC | PRN

## 2017-12-06 MED ORDER — ZOLPIDEM TARTRATE 5 MG PO TABS: 5.0000 mg | ORAL_TABLET | Freq: Every evening | ORAL | Status: DC | PRN

## 2017-12-06 MED ORDER — INSULIN ASPART 100 UNIT/ML ~~LOC~~ SOLN
0.0000 [IU] | Freq: Three times a day (TID) | SUBCUTANEOUS | Status: DC
  Administered 2017-12-07: 3 [IU] via SUBCUTANEOUS
  Administered 2017-12-07: 2 [IU] via SUBCUTANEOUS
  Administered 2017-12-08: 3 [IU] via SUBCUTANEOUS
  Administered 2017-12-09 – 2017-12-10 (×4): 1 [IU] via SUBCUTANEOUS

## 2017-12-06 MED ORDER — ALBUTEROL SULFATE (2.5 MG/3ML) 0.083% IN NEBU: 2.5000 mg | INHALATION_SOLUTION | RESPIRATORY_TRACT | Status: DC | PRN

## 2017-12-06 MED ORDER — HYDROCODONE-ACETAMINOPHEN 5-325 MG PO TABS
1.0000 | ORAL_TABLET | Freq: Once | ORAL | Status: AC
  Administered 2017-12-06: 1 via ORAL
  Filled 2017-12-06: qty 1

## 2017-12-06 MED ORDER — DM-GUAIFENESIN ER 30-600 MG PO TB12
1.0000 | ORAL_TABLET | Freq: Two times a day (BID) | ORAL | Status: DC | PRN
  Administered 2017-12-07 – 2017-12-08 (×3): 1 via ORAL
  Filled 2017-12-06 (×4): qty 1

## 2017-12-06 MED ORDER — OXYCODONE HCL 5 MG PO TABS
5.0000 mg | ORAL_TABLET | Freq: Once | ORAL | Status: AC
  Administered 2017-12-06: 5 mg via ORAL
  Filled 2017-12-06: qty 1

## 2017-12-06 NOTE — ED Notes (Signed)
Patient transported to X-ray 

## 2017-12-06 NOTE — ED Notes (Signed)
ED TO INPATIENT HANDOFF REPORT  Name/Age/Gender [name] 47 y.o. male  Code Status    Code Status Orders  (From admission, onward)         Start     Ordered   12/06/17 2106  Full code  Continuous     12/06/17 2107        Code Status History    Date Active Date Inactive Code Status Order ID Comments User Context   11/05/2016 2001 11/17/2016 2055 Full Code [id number]  Shon Millet, DO ED      Home/SNF/Other Home  Chief Complaint ANKLE PAIN   Level of Care/Admitting Diagnosis ED Disposition    ED Disposition Condition [place]: [facility] [[id number]]  Level of Care: Telemetry [5]  Admit to tele based on following criteria: Other see comments  Comments: Hypertensive urgency  Diagnosis: Hypertensive urgency [585929]  Admitting Physician: Ivor Costa [[id number]]  Attending Physician: Ivor Costa [[id number]]  PT Class (Do Not Modify): Observation [104]  PT Acc Code (Do Not Modify): Observation [10022]       Medical History Past Medical History:  Diagnosis Date  . Arthritis    feet  . CHF (congestive heart failure) ([place])   . Dyspnea   . Hypertension   . Nephrotic syndrome   . Pre-diabetes   . Sarcoid   . Sarcoidosis   . Sleep apnea     Allergies No Known Allergies  IV Location/Drains/Wounds Patient Lines/Drains/Airways Status   Active Line/Drains/Airways    Name:   Placement date:   Placement time:   Site:   Days:   Peripheral IV 12/06/17 Left Antecubital   12/06/17    1554    Antecubital   less than 1   Incision (Closed) 05/05/15 Flank Left   05/05/15    1141     946   Incision (Closed) 05/03/17 Thigh Left   05/03/17    1251     217   Incision (Closed) 05/03/17 Arm Left   05/03/17    1251     217   Pressure Injury 11/09/16 Stage I -  Intact skin with non-blanchable redness of a localized area usually over a bony prominence.    11/09/16    0800     392          Labs/Imaging Results for orders placed  or performed during the hospital encounter of 12/06/17 (from the past 48 hour(s))  Comprehensive metabolic panel     Status: Abnormal   Collection Time: 12/06/17  3:54 PM  Result Value Ref Range   Sodium 140 135 - 145 mmol/L   Potassium 4.0 3.5 - 5.1 mmol/L   Chloride 98 98 - 111 mmol/L   CO2 36 (H) 22 - 32 mmol/L   Glucose, Bld 91 70 - 99 mg/dL   BUN 16 6 - 20 mg/dL   Creatinine, Ser 0.91 0.61 - 1.24 mg/dL   Calcium 8.3 (L) 8.9 - 10.3 mg/dL   Total Protein 6.8 6.5 - 8.1 g/dL   Albumin 3.5 3.5 - 5.0 g/dL   AST 27 15 - 41 U/L   ALT 26 0 - 44 U/L   Alkaline Phosphatase 64 38 - 126 U/L   Total Bilirubin 1.3 (H) 0.3 - 1.2 mg/dL   GFR calc non Af Amer >60 >60 mL/min   GFR calc Af Amer >60 >60 mL/min    Comment: (NOTE) The eGFR has been calculated using the CKD EPI  equation. This calculation has not been validated in all clinical situations. eGFR's persistently <60 mL/min signify possible Chronic Kidney Disease.    Anion gap 6 5 - 15    Comment: Performed at [facility], [place] [address], [place], [place] [address]  Protime-INR     Status: Abnormal   Collection Time: 12/06/17  3:54 PM  Result Value Ref Range   Prothrombin Time 11.0 (L) 11.4 - 15.2 seconds   INR 0.80     Comment: Performed at [facility], [place] [address], [place], [place] [address]  Magnesium     Status: None   Collection Time: 12/06/17  3:55 PM  Result Value Ref Range   Magnesium 2.1 1.7 - 2.4 mg/dL    Comment: Performed at [facility], [place] [address] [place], [place] [address]  Brain natriuretic peptide (order ONLY if patient c/o SOB)     Status: None   Collection Time: 12/06/17  3:55 PM  Result Value Ref Range   B Natriuretic Peptide 72.6 0.0 - 100.0 pg/mL    Comment: Performed at [facility], [place] [address], [place], [place] [address]  Troponin I     Status: Abnormal   Collection Time: 12/06/17  3:55 PM  Result Value Ref Range    Troponin I 0.03 (HH) <0.03 ng/mL    Comment: CRITICAL RESULT CALLED TO, READ BACK BY AND VERIFIED WITH: WATERS,Q AT 7915 ON 12/06/2017 BY MOSLEY,J Performed at [facility], [place] [address] [place], [place] [address]   CBC with Differential/Platelet     Status: Abnormal   Collection Time: 12/06/17  3:55 PM  Result Value Ref Range   WBC 7.3 4.0 - 10.5 K/uL   RBC 4.81 4.22 - 5.81 MIL/uL   Hemoglobin 14.0 13.0 - 17.0 g/dL   HCT 47.0 39.0 - 52.0 %   MCV 97.7 80.0 - 100.0 fL   MCH 29.1 26.0 - 34.0 pg   MCHC 29.8 (L) 30.0 - 36.0 g/dL   RDW 13.4 11.5 - 15.5 %   Platelets 145 (L) 150 - 400 K/uL   nRBC 0.0 0.0 - 0.2 %   Neutrophils Relative % 74 %   Neutro Abs 5.5 1.7 - 7.7 K/uL   Lymphocytes Relative 11 %   Lymphs Abs 0.8 0.7 - 4.0 K/uL   Monocytes Relative 11 %   Monocytes Absolute 0.8 0.1 - 1.0 K/uL   Eosinophils Relative 2 %   Eosinophils Absolute 0.1 0.0 - 0.5 K/uL   Basophils Relative 1 %   Basophils Absolute 0.0 0.0 - 0.1 K/uL   Immature Granulocytes 1 %   Abs Immature Granulocytes 0.07 0.00 - 0.07 K/uL    Comment: Performed at [facility], [place] [address] [place], [place] [address]  Uric acid     Status: Abnormal   Collection Time: 12/06/17  3:55 PM  Result Value Ref Range   Uric Acid, Serum 9.6 (H) 3.7 - 8.6 mg/dL    Comment: Performed at [facility], [place] [address], [place], [name] [address]  Blood gas, venous     Status: Abnormal   Collection Time: 12/06/17  6:00 PM  Result Value Ref Range   pH, Ven 7.348 7.250 - 7.430   pCO2, Ven 67.1 (H) 44.0 - 60.0 mmHg   pO2, Ven 33.5 32.0 - 45.0 mmHg   Bicarbonate 35.9 (H) 20.0 - 28.0 mmol/L   Acid-Base Excess 7.9 (H) 0.0 - 2.0 mmol/L  O2 Saturation 59.5 %   Patient temperature 98.6    Collection site VENOUS    Drawn by DRAWN BY RN    Sample type VENOUS     Comment: Performed at [place] [address] [place], [place] [address]  Troponin I      Status: Abnormal   Collection Time: 12/06/17  8:38 PM  Result Value Ref Range   Troponin I 0.03 (HH) <0.03 ng/mL    Comment: CRITICAL VALUE NOTED.  VALUE IS CONSISTENT WITH PREVIOUSLY REPORTED AND CALLED VALUE. Performed at [facility], [place] [address] [place], [place] [address]    Dg Chest 2 View  Result Date: 12/06/2017 CLINICAL DATA:  Shortness of breath EXAM: CHEST - 2 VIEW COMPARISON:  08/05/2017 FINDINGS: Mild cardiomegaly with bilateral hilar enlargement. There is bilateral interstitial opacities, slightly worsened from the prior study. Chronic right lung volume loss with small pleural effusion. IMPRESSION: 1. Cardiomegaly with increased interstitial opacities, likely mild interstitial pulmonary edema superimposed on chronic interstitial changes related sarcoidosis. 2. Chronic right lung volume loss with small pleural effusion. Electronically Signed   By: Ulyses Jarred M.D.   On: 12/06/2017 16:45   Ct Angio Chest Pe W And/or Wo Contrast  Result Date: 12/06/2017 CLINICAL DATA:  Bilateral ankle pain and swelling x2 days. Patient ran out of CHF medication yesterday. History of sarcoid. EXAM: CT ANGIOGRAPHY CHEST WITH CONTRAST TECHNIQUE: Multidetector CT imaging of the chest was performed using the standard protocol during bolus administration of intravenous contrast. Multiplanar CT image reconstructions and MIPs were obtained to evaluate the vascular anatomy. CONTRAST:  113m ISOVUE-370 IOPAMIDOL (ISOVUE-370) INJECTION 76% COMPARISON:  11/11/2016 FINDINGS: Cardiovascular: Chronic dilatation of the main pulmonary artery to 4.3 cm consistent with chronic pulmonary hypertension. No large central pulmonary embolus is identified. Opacification of the pulmonary arteries is limited beyond the lobar level due to motion artifacts. Small peripheral pulmonary emboli would be difficult to entirely exclude but is not conclusive. Heart size is enlarged without pericardial effusion. Stable  appearance of the thoracic aorta without significant atherosclerotic calcifications. No coronary arterial calcifications. Mediastinum/Nodes: No enlarged mediastinal, hilar, or axillary lymph nodes. Thyroid gland, trachea, and esophagus demonstrate no significant findings. Lungs/Pleura: Chronic right-sided pleural thickening calcifications similar prior exam. Trace right-sided pleural effusion. Chronic pleuroparenchymal architectural distortion and linear scarring along periphery the right lung and periphery of left upper lobe similar in appearance to prior. Some scarring along the periphery of the left lung is calcified. No suspicious pulmonary nodules or masses. Upper Abdomen: Calcified retroperitoneal lymph nodes similar to prior. Musculoskeletal: No aggressive osseous lesions.  No acute fracture. Review of the MIP images confirms the above findings. IMPRESSION: 1. Cardiomegaly with chronic marked dilatation of the main pulmonary artery compatible pulmonary hypertension. 2. No large central pulmonary embolus is identified. Peripheral pulmonary arteries are limited due to timing of contrast motion artifacts. 3. Subpleural areas of scarring bilaterally as above. 4. Stigmata of chronic sarcoidosis given calcified lymph nodes in the upper abdomen. Electronically Signed   By: DAshley RoyaltyM.D.   On: 12/06/2017 19:55   Dg Foot Complete Right  Result Date: 12/06/2017 CLINICAL DATA:  Generalized right foot pain for 3 days. EXAM: RIGHT FOOT COMPLETE - 3+ VIEW COMPARISON:  March 01, 2017 FINDINGS: There is no evidence of fracture or dislocation. Small plantar calcaneal spur is noted. Soft tissues are unremarkable. IMPRESSION: No acute fracture dislocation. Electronically Signed   By: WAbelardo DieselM.D.   On: 12/06/2017 16:41  EKG Interpretation  Date/Time:  Tuesday December 06 2017 15:52:56 EDT Ventricular Rate:  79 PR Interval:    QRS Duration: 117 QT Interval:  384 QTC Calculation: 441 R Axis:   99 Text  Interpretation:  Sinus rhythm Ventricular premature complex Prolonged PR interval Nonspecific intraventricular conduction delay Probable anteroseptal infarct, old No significant change since last tracing Confirmed by Wandra Arthurs [id number]) on 12/06/2017 5:44:18 PM   Pending Labs Unresulted Labs (From admission, onward)    Start     Ordered   12/07/17 0500  Hemoglobin A1c  Tomorrow morning,   R     12/06/17 2103   12/07/17 0500  Lipid panel  Tomorrow morning,   R    Comments:  Please obtain as a fasting lipid panel - should not have eaten/ drank food for 8 hours prior to labs.    12/06/17 2103   12/07/17 0500  Cortisol-am, blood  Tomorrow morning,   R     12/06/17 2103   12/06/17 2121  Culture, blood (Routine X 2) w Reflex to ID Panel  BLOOD CULTURE X 2,   R    Comments:  Please obtain prior to antibiotic administration.    12/06/17 2120   12/06/17 2106  HIV antibody (Routine Testing)  Once,   R     12/06/17 2107   12/06/17 2104  Troponin I (q 6hr x 3)  Now then every 6 hours,   R     12/06/17 2103   12/06/17 2103  Urine rapid drug screen (hosp performed)  Once,   R     12/06/17 2102          Vitals/Pain Today's Vitals   12/06/17 1800 12/06/17 1849 12/06/17 1857 12/06/17 1900  BP: (!) 209/153 (!) 176/127  (!) 179/130  Pulse: 97 98  (!) 120  Resp: (!) 28 (!) 23  (!) 28  Temp:      TempSrc:      SpO2: 98% 98%  93%  Weight:      Height:      PainSc:   8      Isolation Precautions No active isolations  Medications Medications  sodium chloride 0.9 % injection (has no administration in time range)  iopamidol (ISOVUE-370) 76 % injection (has no administration in time range)  hydrALAZINE (APRESOLINE) injection 5 mg (has no administration in time range)  oxyCODONE-acetaminophen (PERCOCET/ROXICET) 5-325 MG per tablet 1 tablet (has no administration in time range)  torsemide (DEMADEX) tablet 40 mg (has no administration in time range)  magnesium oxide (MAG-OX) tablet 400 mg  (has no administration in time range)  aspirin EC tablet 325 mg (has no administration in time range)  nitroGLYCERIN (NITROSTAT) SL tablet 0.4 mg (has no administration in time range)  morphine 2 MG/ML injection 2 mg (has no administration in time range)  levalbuterol (XOPENEX) nebulizer solution 1.25 mg (has no administration in time range)  ipratropium (ATROVENT) nebulizer solution 0.5 mg (has no administration in time range)  dextromethorphan-guaiFENesin (MUCINEX DM) 30-600 MG per 12 hr tablet 1 tablet (has no administration in time range)  nicotine (NICODERM CQ - dosed in mg/24 hours) patch 21 mg (has no administration in time range)  methylPREDNISolone sodium succinate (SOLU-MEDROL) 125 mg/2 mL injection 60 mg (has no administration in time range)  acetaminophen (TYLENOL) tablet 650 mg (has no administration in time range)  ondansetron (ZOFRAN) injection 4 mg (has no administration in time range)  enoxaparin (LOVENOX) injection 40 mg (has no administration in  time range)  zolpidem (AMBIEN) tablet 5 mg (has no administration in time range)  ALPRAZolam (XANAX) tablet 0.25 mg (has no administration in time range)  insulin aspart (novoLOG) injection 0-9 Units (has no administration in time range)  insulin aspart (novoLOG) injection 0-5 Units (has no administration in time range)  amLODipine (NORVASC) tablet 10 mg (has no administration in time range)  HYDROcodone-acetaminophen (NORCO/VICODIN) 5-325 MG per tablet 1 tablet (1 tablet Oral Given 12/06/17 1541)  oxyCODONE-acetaminophen (PERCOCET/ROXICET) 5-325 MG per tablet 1 tablet (1 tablet Oral Given 12/06/17 1757)  furosemide (LASIX) injection 40 mg (40 mg Intravenous Given 12/06/17 1802)  methylPREDNISolone sodium succinate (SOLU-MEDROL) 125 mg/2 mL injection 125 mg (125 mg Intravenous Given 12/06/17 1950)  iopamidol (ISOVUE-370) 76 % injection 100 mL (100 mLs Intravenous Contrast Given 12/06/17 1926)  oxyCODONE (Oxy IR/ROXICODONE) immediate  release tablet 5 mg (5 mg Oral Given 12/06/17 2039)    Mobility walks

## 2017-12-06 NOTE — ED Provider Notes (Addendum)
[facility]-EMERGENCY DEPT Provider Note   CSN: [id number] Arrival date & time: 12/06/17  1423     History   Chief Complaint Chief Complaint  Patient presents with  . Joint Swelling    HPI Hunter [name] is a 47 y.o. male.  HPI  [name] with a history of sarcoidosis, FSGS nephropathy, heart failure with preserved ejection fraction, sleep apnea, morbid obesity presenting for right foot swelling and pain.  Patient reports he was sent by his primary care provider due to concerns that he was retaining fluid.  Patient reports that over the past 2 days, he has had increasing pain and swelling over the MTP joint of his right foot and the medial instep of his foot.  He reports that this is occurred before, but not as severe.  Patient denies any fevers, chills, nausea, vomiting.  Patient does report that he recently had an upper respiratory infection over the past week resulting in productive cough.  He reports that he is also had increasing shortness of breath over the past week.  He believes this may be due to a recent course of prednisone that he was on per his pulmonologist for sarcoidosis.  Patient reports it is finished at present.  Patient denies any chest pain at present.  He denies any recent immobilization, hospitalization, cancer treatment, history DVT/PE, or calf tenderness or calf swelling.  Patient is on 3 L of oxygen at baseline, and also is on CPAP at night.  Patient denies any PND, and reports orthopnea at baseline.  Patient denies any other medication changes other than the prednisone.  He denies any recent significant increase in salty food in his diet.  Reports approximately 30 to 40 pound weight increase in the past 3 to 4 months.  Past Medical History:  Diagnosis Date  . Arthritis    feet  . CHF (congestive heart failure) (HCC)   . Dyspnea   . Hypertension   . Nephrotic syndrome   . Pre-diabetes   . Sarcoid   . Sarcoidosis   .  Sleep apnea     Patient Active Problem List   Diagnosis Date Noted  . Weakness 08/05/2017  . 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 Chronic diastolic CHF (congestive heart failure) (HCC)   . Pleural effusion, right   . Prediabetes   . Sarcoidosis of lung (HCC)   . Generalized edema   . RVF (right ventricular failure) (HCC)   . Pressure injury of skin 11/09/2016  . Pulmonary edema 11/06/2016  . Restrictive lung disease secondary to obesity 11/06/2016  . Obesity hypoventilation syndrome (HCC) 11/06/2016  . OSA (obstructive sleep apnea) 11/06/2016  . CHF (congestive heart failure) (HCC) 11/05/2016  . HTN (hypertension) 11/02/2016  . Hypersomnia 03/02/2016  . Sarcoidosis 02/18/2016  . Hypoxemia 02/18/2016  . Morbid obesity (HCC) 02/18/2016    Past Surgical History:  Procedure Laterality Date  . MUSCLE BIOPSY Right 05/03/2017   Procedure: RIGHT THIGH MUSCLE BIOPSY/RIGHT DELTOID MUSCLE BIOPSY;  Surgeon: Manus Rudd, MD;  Location: [facility] OR;  Service: General;  Laterality: Right;  . RIGHT HEART CATH N/A 11/11/2016   Procedure: RIGHT HEART CATH;  Surgeon: Laurey Morale, MD;  Location: [facility] INVASIVE CV LAB;  Service: Cardiovascular;  Laterality: N/A;        Home Medications    Prior to Admission medications   Medication Sig Start Date End Date Taking? Authorizing Provider  albuterol (PROVENTIL HFA;VENTOLIN HFA)  108 (90 Base) MCG/ACT inhaler Inhale 2 puffs into the lungs every 6 (six) hours as needed for wheezing or shortness of breath. 11/08/17  Yes Kallie Locks, FNP  ciclopirox (PENLAC) 8 % solution APPLY TOPICALLY AT BEDTIME OVER NAIL AND SURROUNDING SKIN. APPLY DAILY OVER PREVIOUS COAT. AFTER 7 DAYS, MAY REMOVE WITH ALCOHOL AND CONTINU Patient taking differently: Apply 1 application topically 3 (three) times daily.  04/07/17  Yes Vivi Barrack, DPM  diclofenac sodium (VOLTAREN) 1 % GEL APPLY 2 GRAMS TO FOOT (AND  AFFECTED AREAS) 2-4 TIMES DAILY. 04/07/17  Yes Vivi Barrack, DPM  HYDROcodone-acetaminophen (NORCO/VICODIN) 5-325 MG tablet Take 1 tablet by mouth every 6 (six) hours as needed for moderate pain.   Yes [provider]  magnesium oxide (MAG-OX) 400 (241.3 Mg) MG tablet Take 1 tablet (400 mg total) by mouth every evening. 11/08/17  Yes Kallie Locks, FNP  metFORMIN (GLUCOPHAGE) 500 MG tablet Take 1 tablet (500 mg total) by mouth 2 (two) times daily with a meal. 11/08/17  Yes Kallie Locks, FNP  predniSONE (DELTASONE) 20 MG tablet TAKE 2 TABLETS (4OMG) BY MOUTH DAILY WITH FOOD 10/11/17  Yes Leslye Peer, MD  torsemide (DEMADEX) 20 MG tablet Take 40 mg by mouth daily.   Yes [provider]  traMADol (ULTRAM) 50 MG tablet Take 1 tablet (50 mg total) by mouth 2 (two) times daily as needed. Patient taking differently: Take 50 mg by mouth 2 (two) times daily as needed for severe pain.  07/25/17  Yes Kallie Locks, FNP  torsemide (DEMADEX) 10 MG tablet Take 2 tablets (20 mg total) by mouth 2 (two) times daily. 11/08/17   Kallie Locks, FNP    Family History Family History  Problem Relation Age of Onset  . Asthma Sister   . Hypertension Paternal Grandmother   . Colon cancer Neg Hx        Patient doesn't know for sure if his family history has colon, pancreatic, rectal cancer    Social History Social History   Tobacco Use  . Smoking status: Current Some Day Smoker    Packs/day: 0.10    Years: 10.00    Pack years: 1.00    Types: Cigarettes  . Smokeless tobacco: Never Used  . Tobacco comment: 2 cigarettes/day 08/05/17  Substance Use Topics  . Alcohol use: Yes    Alcohol/week: 3.0 standard drinks    Types: 3 Cans of beer per week  . Drug use: No     Allergies   Patient has no known allergies.   Review of Systems Review of Systems  Constitutional: Negative for chills and fever.  HENT: Positive for congestion and rhinorrhea. Negative for sore throat.     Eyes: Negative for visual disturbance.  Respiratory: Positive for cough and shortness of breath. Negative for chest tightness.   Cardiovascular: Positive for leg swelling. Negative for chest pain and palpitations.  Gastrointestinal: Negative for abdominal pain, nausea and vomiting.  Genitourinary: Negative for dysuria and flank pain.  Musculoskeletal: Positive for arthralgias and joint swelling. Negative for back pain and myalgias.  Skin: Positive for color change. Negative for rash.  Neurological: Negative for dizziness and syncope.     Physical Exam Updated Vital Signs BP (!) 179/130   Pulse (!) 120   Temp 98.6 F (37 C) (Oral)   Resp (!) 28   Ht 6' (1.829 m)   Wt 126.1 kg   SpO2 93%   BMI 37.70 kg/m  Physical Exam  Constitutional: He appears well-developed and well-nourished.  Morbidly obese.  HENT:  Head: Normocephalic and atraumatic.  Mouth/Throat: Oropharynx is clear and moist.  Eyes: Pupils are equal, round, and reactive to light. Conjunctivae and EOM are normal.  Neck: Normal range of motion. Neck supple.  Cardiovascular: Normal rate, regular rhythm, S1 normal and S2 normal.  No murmur heard. Not tachycardic on initial examination. No calf tenderness and no lower extremity edema.  Pulmonary/Chest: Effort normal. He has no wheezes. He has rales.  Rales present in bilateral lower lung bases.  Abdominal: Soft. He exhibits no distension. There is no tenderness. There is no guarding.  Musculoskeletal: Normal range of motion. He exhibits edema. He exhibits no deformity.  Patient has mild edema over the dorsum of the right foot without circumscribed erythema.  Erythema present over the right MTP joint, and the medial instep of the foot. 2+ DP and PT pulses in bilateral lower extremities. No calf tenderness.  Compartments of the bilateral lower cavities are soft.  Neurological: He is alert.  Cranial nerves grossly intact. Patient moves extremities symmetrically and  with good coordination.  Skin: Skin is warm and dry. No rash noted. No erythema.  Psychiatric: He has a normal mood and affect. His behavior is normal. Judgment and thought content normal.  Nursing note and vitals reviewed.    ED Treatments / Results  Labs (all labs ordered are listed, but only abnormal results are displayed) Labs Reviewed  TROPONIN I - Abnormal; Notable for the following components:      Result Value   Troponin I 0.03 (*)    All other components within normal limits  CBC WITH DIFFERENTIAL/PLATELET - Abnormal; Notable for the following components:   MCHC 29.8 (*)    Platelets 145 (*)    All other components within normal limits  COMPREHENSIVE METABOLIC PANEL - Abnormal; Notable for the following components:   CO2 36 (*)    Calcium 8.3 (*)    Total Bilirubin 1.3 (*)    All other components within normal limits  PROTIME-INR - Abnormal; Notable for the following components:   Prothrombin Time 11.0 (*)    All other components within normal limits  URIC ACID - Abnormal; Notable for the following components:   Uric Acid, Serum 9.6 (*)    All other components within normal limits  BLOOD GAS, VENOUS - Abnormal; Notable for the following components:   pCO2, Ven 67.1 (*)    Bicarbonate 35.9 (*)    Acid-Base Excess 7.9 (*)    All other components within normal limits  MAGNESIUM  BRAIN NATRIURETIC PEPTIDE  TROPONIN I    EKG EKG Interpretation  Date/Time:  Tuesday December 06 2017 15:52:56 EDT Ventricular Rate:  79 PR Interval:    QRS Duration: 117 QT Interval:  384 QTC Calculation: 441 R Axis:   99 Text Interpretation:  Sinus rhythm Ventricular premature complex Prolonged PR interval Nonspecific intraventricular conduction delay Probable anteroseptal infarct, old No significant change since last tracing Confirmed by Richardean Canal [phone]) on 12/06/2017 5:44:18 PM   Radiology Dg Chest 2 View  Result Date: 12/06/2017 CLINICAL DATA:  Shortness of breath EXAM:  CHEST - 2 VIEW COMPARISON:  08/05/2017 FINDINGS: Mild cardiomegaly with bilateral hilar enlargement. There is bilateral interstitial opacities, slightly worsened from the prior study. Chronic right lung volume loss with small pleural effusion. IMPRESSION: 1. Cardiomegaly with increased interstitial opacities, likely mild interstitial pulmonary edema superimposed on chronic interstitial changes related sarcoidosis. 2. Chronic right  lung volume loss with small pleural effusion. Electronically Signed   By: Deatra Robinson M.D.   On: 12/06/2017 16:45   Ct Angio Chest Pe W And/or Wo Contrast  Result Date: 12/06/2017 CLINICAL DATA:  Bilateral ankle pain and swelling x2 days. Patient ran out of CHF medication yesterday. History of sarcoid. EXAM: CT ANGIOGRAPHY CHEST WITH CONTRAST TECHNIQUE: Multidetector CT imaging of the chest was performed using the standard protocol during bolus administration of intravenous contrast. Multiplanar CT image reconstructions and MIPs were obtained to evaluate the vascular anatomy. CONTRAST:  ISOVUE-370 IOPAMIDOL (ISOVUE-370) INJECTION 76% COMPARISON:  11/11/2016 FINDINGS: Cardiovascular: Chronic dilatation of the main pulmonary artery to 4.3 cm consistent with chronic pulmonary hypertension. No large central pulmonary embolus is identified. Opacification of the pulmonary arteries is limited beyond the lobar level due to motion artifacts. Small peripheral pulmonary emboli would be difficult to entirely exclude but is not conclusive. Heart size is enlarged without pericardial effusion. Stable appearance of the thoracic aorta without significant atherosclerotic calcifications. No coronary arterial calcifications. Mediastinum/Nodes: No enlarged mediastinal, hilar, or axillary lymph nodes. Thyroid gland, trachea, and esophagus demonstrate no significant findings. Lungs/Pleura: Chronic right-sided pleural thickening calcifications similar prior exam. Trace right-sided pleural effusion.  Chronic pleuroparenchymal architectural distortion and linear scarring along periphery the right lung and periphery of left upper lobe similar in appearance to prior. Some scarring along the periphery of the left lung is calcified. No suspicious pulmonary nodules or masses. Upper Abdomen: Calcified retroperitoneal lymph nodes similar to prior. Musculoskeletal: No aggressive osseous lesions.  No acute fracture. Review of the MIP images confirms the above findings. IMPRESSION: 1. Cardiomegaly with chronic marked dilatation of the main pulmonary artery compatible pulmonary hypertension. 2. No large central pulmonary embolus is identified. Peripheral pulmonary arteries are limited due to timing of contrast motion artifacts. 3. Subpleural areas of scarring bilaterally as above. 4. Stigmata of chronic sarcoidosis given calcified lymph nodes in the upper abdomen. Electronically Signed   By: Tollie Eth M.D.   On: 12/06/2017 19:55   Dg Foot Complete Right  Result Date: 12/06/2017 CLINICAL DATA:  Generalized right foot pain for 3 days. EXAM: RIGHT FOOT COMPLETE - 3+ VIEW COMPARISON:  March 01, 2017 FINDINGS: There is no evidence of fracture or dislocation. Small plantar calcaneal spur is noted. Soft tissues are unremarkable. IMPRESSION: No acute fracture dislocation. Electronically Signed   By: Sherian Rein M.D.   On: 12/06/2017 16:41    Procedures Procedures (including critical care time)  CRITICAL CARE Performed by: Elisha Ponder   Total critical care time: 35 minutes  Critical care time was exclusive of separately billable procedures and treating other patients.  Critical care was necessary to treat or prevent imminent or life-threatening deterioration.  Critical care was time spent personally by me on the following activities: development of treatment plan with patient and/or surrogate as well as nursing, discussions with consultants, evaluation of patient's response to treatment, examination of  patient, obtaining history from patient or surrogate, ordering and performing treatments and interventions, ordering and review of laboratory studies, ordering and review of radiographic studies, pulse oximetry and re-evaluation of patient's condition.  Medications Ordered in ED Medications  sodium chloride 0.9 % injection (has no administration in time range)  iopamidol (ISOVUE-370) 76 % injection (has no administration in time range)  HYDROcodone-acetaminophen (NORCO/VICODIN) 5-325 MG per tablet 1 tablet (1 tablet Oral Given 12/06/17 1541)  oxyCODONE-acetaminophen (PERCOCET/ROXICET) 5-325 MG per tablet 1 tablet (1 tablet Oral Given 12/06/17 1757)  furosemide (  LASIX) injection 40 mg (40 mg Intravenous Given 12/06/17 1802)  methylPREDNISolone sodium succinate (SOLU-MEDROL) 125 mg/2 mL injection 125 mg (125 mg Intravenous Given 12/06/17 1950)  iopamidol (ISOVUE-370) 76 % injection 100 mL (100 mLs Intravenous Contrast Given 12/06/17 1926)     Initial Impression / Assessment and Plan / ED Course  I have reviewed the triage vital signs and the nursing notes.  Pertinent labs & imaging results that were available during my care of the patient were reviewed by me and considered in my medical decision making (see chart for details).  Clinical Course as of Dec 07 2014  Tue Dec 06, 2017  1845 No active chest pain. Suspect demand ischemia.   Troponin I(!!): 0.03 [AM]  2014 BP improved but pt is now more tachycardic. CTPA ordered and is negative for large pulmonary embolus. Pt has marked pulmonary dilatation.   [AM]    Clinical Course User Index [AM] Elisha Ponder, PA-C    Patient is nontoxic-appearing and hemodynamically stable on initial examination, but significantly hypertensive.  Highest blood pressure reading 212/102.  Patient is not on beta-blocker or other antihypertensive.  Patient does not have a history of long-standing hypertension per his report.  Patient is not currently followed  by cardiology.  No documented history of CAD.  Last echocardiogram demonstrating normal ejection fraction, and no regional wall motion abnormalities of the left ventricle, but dilatation of the right ventricle.  Question new onset heart failure today involving the left ventricle.  Blood pressure reduced after initial Lasix dose.  No further antihypertensives needed.  Patient did have elevated troponin to 0.03.  Delta troponin is pending.  Suspect that this is due to demand ischemia, and not secondary to ACS.  Regarding patient's foot pain, clinical presentation is most consistent with gout.  Patient has a slightly elevated uric acid.  Per discussion with attending physician, patient placed on prednisone, and pain medication.  Dr. Clyde Lundborg of [facility] ordered who will admit patient. Appreciate his involvement in the care of this patient.   This is a shared visit with Dr. Silverio Lay. Patient was independently evaluated by this attending physician. Attending physician consulted in evaluation and admission management.  Final Clinical Impressions(s) / ED Diagnoses   Final diagnoses:  Hypertensive urgency  Elevated troponin  Right foot pain  Elevated uric acid in blood  Respiratory acidosis    ED Discharge Orders    None       Delia Chimes 12/06/17 2132    Charlynne Pander, MD 12/06/17 2355    Aviva Kluver B, PA-C 12/16/17 1610    Charlynne Pander, MD 12/17/17 [phone]

## 2017-12-06 NOTE — ED Triage Notes (Signed)
Pt BIB EMS from [facility] with complaints bilateral ankle pain/ swelling x2 days.  Pt reports running out of CHF meds yesterday, but ankle swelling started 2 days ago. Pt wears 3L O2 at baseline.

## 2017-12-06 NOTE — ED Notes (Signed)
Bed: QM57 Expected date:  Expected time:  Means of arrival:  Comments: EMS 47 yo CHF exacerbation

## 2017-12-06 NOTE — H&P (Signed)
History and Physical    Hunter [name] SLH:[id number] DOB: 1970-11-11 DOA: 12/06/2017  Referring MD/NP/PA:   PCP: Azzie Glatter, FNP   Patient coming from:  The patient is coming from [employer].  At baseline, pt is independent for most of ADL.      Chief Complaint: right foot pain and SOB  HPI: Hunter [name] is a 47 y.o. male with medical history significant of hypertension, prediabetes, sarcoidosis on 3 L nasal cannula oxygen at home, dCHF, OSA on CPAP, nephrotic syndrome, tobacco abuse, who presents with right foot pain and SOB.  Patient states that he has been having right foot swelling, redness and pain in the past 3 days, which has been progressively getting worse.  The pain is constant, 10 out of 10 severity, sharp, nonradiating.  No fever chills.  No injury.  Patient states that he had similar issue 59-monthago, and was treated with steroid for possible gout.  He did not receive any antibiotics that time.   Patient also reports cough and shortness of breath.  No chest pain.  He coughs up greenish, sometimes brownish colored sputum.  No tenderness in calf areas.  Patient does not have nausea, vomiting, diarrhea, abdominal pain, symptoms of UTI.  No unilateral weakness.  Patient states that he ran out of his torsemide since yesterday. He just completed a course of prednisone 40 mg yesterday for sarcoidosis.  ED Course: pt was found to have elevated blood pressure 212/102-->179/130, tachycardia, tachypnea, oxygen saturation 93--97% on 3 L nasal cannula oxygen, BNP 72.6, temperature normal.  Troponin +0.03, WBC 7.3, INR 0.80, electrolytes renal function okay, chest x-ray showed mild interstitial pulmonary edema.  X-ray of right foot is negative for bony fracture. CT angiogram of chest is a limited study, but no central PE, showed chronic sarcoidosis, and pulmonary hypertension. Patient is placed on telemetry bed for observation.  Review of Systems:   General: no fevers,  chills, has body weight gain, has fatigue HEENT: no blurry vision, hearing changes or sore throat Respiratory: has dyspnea, coughing, no wheezing CV: no chest pain, no palpitations GI: no nausea, vomiting, abdominal pain, diarrhea, constipation GU: no dysuria, burning on urination, increased urinary frequency, hematuria  Ext: has trace leg edema. Neuro: no unilateral weakness, numbness, or tingling, no vision change or hearing loss Skin: no rash, no skin tear. MSK: has right foot pain and swelling. Heme: No easy bruising.  Travel history: No recent long distant travel.  Allergy: No Known Allergies  Past Medical History:  Diagnosis Date  . Arthritis    feet  . [place]   . Dyspnea   . Hypertension   . Nephrotic syndrome   . Pre-diabetes   . Sarcoid   . Sarcoidosis   . Sleep apnea     Past Surgical History:  Procedure Laterality Date  . MUSCLE BIOPSY Right 05/03/2017   Procedure: RIGHT THIGH MUSCLE BIOPSY/RIGHT DELTOID MUSCLE BIOPSY;  Surgeon: TDonnie Mesa MD;  [facility]  Service: General;  Laterality: Right;  . RIGHT HEART CATH N/A 11/11/2016   Procedure: RIGHT HEART CATH;  Surgeon: MLarey Dresser MD;  [facility];  Service: Cardiovascular;  Laterality: N/A;    Social History:  reports that he has been smoking cigarettes. He has a 1.00 pack-year smoking history. He has never used smokeless tobacco. He reports that he drinks about 3.0 standard drinks of alcohol per week. He reports that he does not use drugs.  Family  History:  Family History  Problem Relation Age of Onset  . Asthma Sister   . Hypertension Paternal Grandmother   . Colon cancer Neg Hx        Patient doesn't know for sure if his family history has colon, pancreatic, rectal cancer     Prior to Admission medications   Medication Sig Start Date End Date Taking? Authorizing Provider  albuterol (PROVENTIL HFA;VENTOLIN HFA) 108 (90 Base) MCG/ACT inhaler  Inhale 2 puffs into the lungs every 6 (six) hours as needed for wheezing or shortness of breath. 11/08/17  Yes Azzie Glatter, FNP  ciclopirox (PENLAC) 8 % solution APPLY TOPICALLY AT BEDTIME OVER NAIL AND SURROUNDING SKIN. APPLY DAILY OVER PREVIOUS COAT. AFTER 7 DAYS, MAY REMOVE WITH ALCOHOL AND CONTINU Patient taking differently: Apply 1 application topically 3 (three) times daily.  04/07/17  Yes Trula Slade, DPM  diclofenac sodium (VOLTAREN) 1 % GEL APPLY 2 GRAMS TO FOOT (AND AFFECTED AREAS) 2-4 TIMES DAILY. 04/07/17  Yes Trula Slade, DPM  HYDROcodone-acetaminophen (NORCO/VICODIN) 5-325 MG tablet Take 1 tablet by mouth every 6 (six) hours as needed for moderate pain.   Yes [provider]  magnesium oxide (MAG-OX) 400 (241.3 Mg) MG tablet Take 1 tablet (400 mg total) by mouth every evening. 11/08/17  Yes Azzie Glatter, FNP  metFORMIN (GLUCOPHAGE) 500 MG tablet Take 1 tablet (500 mg total) by mouth 2 (two) times daily with a meal. 11/08/17  Yes Azzie Glatter, FNP  predniSONE (DELTASONE) 20 MG tablet TAKE 2 TABLETS (4OMG) BY MOUTH DAILY WITH FOOD 10/11/17  Yes Collene Gobble, MD  torsemide (DEMADEX) 20 MG tablet Take 40 mg by mouth daily.   Yes [provider]  traMADol (ULTRAM) 50 MG tablet Take 1 tablet (50 mg total) by mouth 2 (two) times daily as needed. Patient taking differently: Take 50 mg by mouth 2 (two) times daily as needed for severe pain.  07/25/17  Yes Azzie Glatter, FNP  torsemide (DEMADEX) 10 MG tablet Take 2 tablets (20 mg total) by mouth 2 (two) times daily. 11/08/17   Azzie Glatter, FNP    Physical Exam: Vitals:   12/06/17 2230 12/06/17 2330 12/07/17 0000 12/07/17 0030  BP: (!) 171/108 (!) 172/101 (!) 169/102 (!) 175/136  Pulse: 87 (!) 107 (!) 103 70  Resp:      Temp:      TempSrc:      SpO2: 94% 99% 98% 96%  Weight:      Height:       General: Not in acute distress HEENT:       Eyes: PERRL, EOMI, no scleral icterus.        ENT: No discharge from the ears and nose, no pharynx injection, no tonsillar enlargement.        Neck: No JVD, no bruit, no mass felt. Heme: No neck lymph node enlargement. Cardiac: S1/S2, RRR, No murmurs, No gallops or rubs. Respiratory: has fine crackles bilaterally. GI: Soft, nondistended, nontender, no rebound pain, no organomegaly, BS present. GU: No hematuria Ext: has trace leg edema bilaterally. 2+DP/PT pulse bilaterally. Musculoskeletal: has tenderness, swelling, erythema in dorsum of the right foot,  right great toe and right second toe. Skin: No rashes.  Neuro: Alert, oriented X3, cranial nerves II-XII grossly intact, moves all extremities normally.  Psych: Patient is not psychotic, no suicidal or hemocidal ideation.  Labs on Admission: I have personally reviewed following labs and imaging studies  CBC: Recent Labs  Lab 12/06/17 1555  WBC 7.3  NEUTROABS 5.5  HGB 14.0  HCT 47.0  MCV 97.7  PLT 811*   Basic Metabolic Panel: Recent Labs  Lab 12/06/17 1554 12/06/17 1555  NA 140  --   K 4.0  --   CL 98  --   CO2 36*  --   GLUCOSE 91  --   BUN 16  --   CREATININE 0.91  --   CALCIUM 8.3*  --   MG  --  2.1   GFR: Estimated Creatinine Clearance: 134.7 mL/min (by C-G formula based on SCr of 0.91 mg/dL). Liver Function Tests: Recent Labs  Lab 12/06/17 1554  AST 27  ALT 26  ALKPHOS 64  BILITOT 1.3*  PROT 6.8  ALBUMIN 3.5   No results for input(s): LIPASE, AMYLASE in the last 168 hours. No results for input(s): AMMONIA in the last 168 hours. Coagulation Profile: Recent Labs  Lab 12/06/17 1554  INR 0.80   Cardiac Enzymes: Recent Labs  Lab 12/06/17 1555 12/06/17 2038 12/06/17 2146  TROPONINI 0.03* 0.03* 0.04*   BNP (last 3 results) No results for input(s): PROBNP in the last 8760 hours. HbA1C: No results for input(s): HGBA1C in the last 72 hours. CBG: Recent Labs  Lab 12/06/17 2241  GLUCAP 244*   Lipid Profile: No results for input(s): CHOL,  HDL, LDLCALC, TRIG, CHOLHDL, LDLDIRECT in the last 72 hours. Thyroid Function Tests: No results for input(s): TSH, T4TOTAL, FREET4, T3FREE, THYROIDAB in the last 72 hours. Anemia Panel: No results for input(s): VITAMINB12, FOLATE, FERRITIN, TIBC, IRON, RETICCTPCT in the last 72 hours. Urine analysis:    Component Value Date/Time   LABSPEC 1.015 11/23/2016 1028   PHURINE 7.0 11/23/2016 1028   GLUCOSEU NEGATIVE 11/23/2016 1028   HGBUR NEGATIVE 11/23/2016 1028   BILIRUBINUR negative 11/08/2017 1149   KETONESUR negative 11/08/2017 1149   KETONESUR NEGATIVE 11/23/2016 1028   PROTEINUR =100 (A) 11/08/2017 1149   PROTEINUR 100 (A) 11/23/2016 1028   UROBILINOGEN 0.2 11/08/2017 1149   UROBILINOGEN 0.2 11/23/2016 1028   NITRITE Negative 11/08/2017 1149   NITRITE NEGATIVE 11/23/2016 1028   LEUKOCYTESUR Negative 11/08/2017 1149   Sepsis Labs: @LABRCNTIP (procalcitonin:4,lacticidven:4) ) Recent Results (from the past 240 hour(s))  Culture, blood (Routine X 2) w Reflex to ID Panel     Status: None (Preliminary result)   Collection Time: 12/06/17  9:46 PM  Result Value Ref Range Status   Specimen Description   Final    BLOOD LEFT ANTECUBITAL Performed at [facility], [place] [address], [place], [place] [address]    Special Requests   Final    BOTTLES DRAWN AEROBIC AND ANAEROBIC Blood Culture adequate volume Performed at [facility], [place] [address] [place], [place] [address]    Culture PENDING  Incomplete   Report Status PENDING  Incomplete     Radiological Exams on Admission: Dg Chest 2 View  Result Date: 12/06/2017 CLINICAL DATA:  Shortness of breath EXAM: CHEST - 2 VIEW COMPARISON:  08/05/2017 FINDINGS: Mild cardiomegaly with bilateral hilar enlargement. There is bilateral interstitial opacities, slightly worsened from the prior study. Chronic right lung volume loss with small pleural effusion. IMPRESSION: 1. Cardiomegaly with increased interstitial opacities,  likely mild interstitial pulmonary edema superimposed on chronic interstitial changes related sarcoidosis. 2. Chronic right lung volume loss with small pleural effusion. Electronically Signed   By: Ulyses Jarred M.D.   On: 12/06/2017 16:45   Ct Angio Chest Pe W And/or Wo Contrast  Result Date: 12/06/2017 CLINICAL DATA:  Bilateral ankle pain and swelling x2 days. Patient ran out of CHF medication yesterday. History of sarcoid. EXAM: CT ANGIOGRAPHY CHEST WITH CONTRAST TECHNIQUE: Multidetector CT imaging of the chest was performed using the standard protocol during bolus administration of intravenous contrast. Multiplanar CT image reconstructions and MIPs were obtained to evaluate the vascular anatomy. CONTRAST:  124m ISOVUE-370 IOPAMIDOL (ISOVUE-370) INJECTION 76% COMPARISON:  11/11/2016 FINDINGS: Cardiovascular: Chronic dilatation of the main pulmonary artery to 4.3 cm consistent with chronic pulmonary hypertension. No large central pulmonary embolus is identified. Opacification of the pulmonary arteries is limited beyond the lobar level due to motion artifacts. Small peripheral pulmonary emboli would be difficult to entirely exclude but is not conclusive. Heart size is enlarged without pericardial effusion. Stable appearance of the thoracic aorta without significant atherosclerotic calcifications. No coronary arterial calcifications. Mediastinum/Nodes: No enlarged mediastinal, hilar, or axillary lymph nodes. Thyroid gland, trachea, and esophagus demonstrate no significant findings. Lungs/Pleura: Chronic right-sided pleural thickening calcifications similar prior exam. Trace right-sided pleural effusion. Chronic pleuroparenchymal architectural distortion and linear scarring along periphery the right lung and periphery of left upper lobe similar in appearance to prior. Some scarring along the periphery of the left lung is calcified. No suspicious pulmonary nodules or masses. Upper Abdomen: Calcified  retroperitoneal lymph nodes similar to prior. Musculoskeletal: No aggressive osseous lesions.  No acute fracture. Review of the MIP images confirms the above findings. IMPRESSION: 1. Cardiomegaly with chronic marked dilatation of the main pulmonary artery compatible pulmonary hypertension. 2. No large central pulmonary embolus is identified. Peripheral pulmonary arteries are limited due to timing of contrast motion artifacts. 3. Subpleural areas of scarring bilaterally as above. 4. Stigmata of chronic sarcoidosis given calcified lymph nodes in the upper abdomen. Electronically Signed   By: DAshley RoyaltyM.D.   On: 12/06/2017 19:55   Dg Foot Complete Right  Result Date: 12/06/2017 CLINICAL DATA:  Generalized right foot pain for 3 days. EXAM: RIGHT FOOT COMPLETE - 3+ VIEW COMPARISON:  March 01, 2017 FINDINGS: There is no evidence of fracture or dislocation. Small plantar calcaneal spur is noted. Soft tissues are unremarkable. IMPRESSION: No acute fracture dislocation. Electronically Signed   By: WAbelardo DieselM.D.   On: 12/06/2017 16:41     EKG: Independently reviewed.  Sinus rhythm, QTC 441, LAE, low voltage, anteroseptal infarction pattern, Q waves in lead III   Assessment/Plan Principal Problem:   Hypertensive urgency Active Problems:   HTN (hypertension)   Chronic diastolic CHF (congestive heart failure) (HCC)   Prediabetes   Sarcoidosis of lung (HCC)   Secondary pulmonary arterial hypertension (HCC)   Right foot pain   Elevated troponin   Tobacco abuse   Hypertensive urgency: bp is up to 211/102-->179/130.  Likely due to medication noncompliance.  Patient has positive troponin, but no chest pain.  Patient has shortness of breath.  CT angiogram is negative for central PE.  His shortness breath is likely due to multifactorial etiology, including sarcoidosis, dCHF and possible flash of pulmonary edema due to elevated blood pressure given mild interstitial edema chest x-ray.  -Placed on  telemetry bed for observation -Start amlodipine 10 mg daily - IV hydralazine. -Continue home torsemide -Patient received 1 dose of Lasix 40 mg by IV in ED  Positive troponin: trop 0.03-->0.04. No chest pain.  Most likely due to demand ischemia secondary to hypertensive urgency. -Start aspirin -prn Nitroglycerin and morphine  -trop x 3 -check UDS, A1c and FLP -repeat EKG in AM  Chronic  diastolic CHF and secondary pulmonary arterial hypertension: 2D echo on 11/06/2016 showed EF of 55-60%.  BNP 72.6.  Patient does not have leg edema.  Patient may have had an episode of flash of pulmonary edema, but generally no acute fluid overload. -Continue home dose of torsemide  Prediabetes: Last A1c 5.6 on 07/25/17, well controled. Patient is taking Metformin at home -Start SSI  Sarcoidosis of lung [facility]): -Scheduled Atrovent nebulizer, PRN Xopenex -PRN Mucinex cough  Right foot pain: Patient has erythema, swelling, tenderness in the right foot.  Most likely due to gout though we cannot completely rule out the possibility of cellulitis.  Elevated uric acid 9.6 is consistent with gout though not diagnostic.  Patient does not have fever or leukocytosis. Patient had similar issues before, which resolved after treated with steroid, not with antibiotic treatment.  -Start patient with Solu-Medrol 60 mg daily which also serves as stress dose of steroid. -Get blood culture since we cannot completely rule out cellulitis. -check esr and erp  Tobacco abuse: -Nicotine patch    DVT ppx:   SQ Lovenox Code Status: Full code Family Communication: None at bed side.   Disposition Plan:  Anticipate discharge back to previous [employer] called:  none Admission status: Obs / tele    Date of Service 12/07/2017    Ivor Costa [facility] Pager [phone]  If 7PM-7AM, please contact night-coverage [email or web address] Password [email or web address] 12/07/2017, 1:37 AM

## 2017-12-06 NOTE — Telephone Encounter (Signed)
Patient states that he is having leg swelling and wants to know if he should go to the ED or double up on his Torsemide. Looks like patient has already went to the ed already.

## 2017-12-06 NOTE — ED Notes (Signed)
ED Provider at bedside. 

## 2017-12-07 ENCOUNTER — Other Ambulatory Visit: Payer: Self-pay

## 2017-12-07 ENCOUNTER — Ambulatory Visit: Payer: Medicaid Other | Admitting: Emergency Medicine

## 2017-12-07 ENCOUNTER — Encounter (HOSPITAL_COMMUNITY): Payer: Self-pay | Admitting: *Deleted

## 2017-12-07 ENCOUNTER — Telehealth: Payer: Self-pay | Admitting: Emergency Medicine

## 2017-12-07 ENCOUNTER — Observation Stay (HOSPITAL_BASED_OUTPATIENT_CLINIC_OR_DEPARTMENT_OTHER): Payer: Self-pay

## 2017-12-07 DIAGNOSIS — R609 Edema, unspecified: Secondary | ICD-10-CM

## 2017-12-07 DIAGNOSIS — I50811 Acute right heart failure: Secondary | ICD-10-CM

## 2017-12-07 DIAGNOSIS — M7989 Other specified soft tissue disorders: Secondary | ICD-10-CM

## 2017-12-07 LAB — SEDIMENTATION RATE: Sed Rate: 23 mm/hr — ABNORMAL HIGH (ref 0–16)

## 2017-12-07 LAB — C-REACTIVE PROTEIN: CRP: 14.6 mg/dL — ABNORMAL HIGH (ref <1.0)

## 2017-12-07 LAB — HEMOGLOBIN A1C
Hgb A1c MFr Bld: 6.1 % — ABNORMAL HIGH (ref 4.8–5.6)
Mean Plasma Glucose: 128.37 mg/dL

## 2017-12-07 LAB — LIPID PANEL
CHOL/HDL RATIO: 3.9 ratio
CHOLESTEROL: 287 mg/dL — AB (ref 0–200)
HDL: 73 mg/dL (ref >40)
LDL Cholesterol: 195 mg/dL — ABNORMAL HIGH (ref 0–99)
TRIGLYCERIDES: 96 mg/dL (ref <150)
VLDL: 19 mg/dL (ref 0–40)

## 2017-12-07 LAB — TROPONIN I
Troponin I: 0.03 ng/mL (ref <0.03)
Troponin I: <0.03 ng/mL (ref <0.03)

## 2017-12-07 LAB — GLUCOSE, CAPILLARY
GLUCOSE-CAPILLARY: 211 mg/dL — AB (ref 70–99)
GLUCOSE-CAPILLARY: 149 mg/dL — AB (ref 70–99)
Glucose-Capillary: 161 mg/dL — ABNORMAL HIGH (ref 70–99)
Glucose-Capillary: 119 mg/dL — ABNORMAL HIGH (ref 70–99)

## 2017-12-07 LAB — MRSA PCR SCREENING: MRSA BY PCR: NEGATIVE

## 2017-12-07 LAB — CORTISOL-AM, BLOOD: CORTISOL - AM: 4.2 ug/dL — AB (ref 6.7–22.6)

## 2017-12-07 MED ORDER — ENOXAPARIN SODIUM 60 MG/0.6ML ~~LOC~~ SOLN
60.0000 mg | SUBCUTANEOUS | Status: DC
  Administered 2017-12-07 – 2017-12-11 (×5): 60 mg via SUBCUTANEOUS
  Filled 2017-12-07 (×5): qty 0.6

## 2017-12-07 MED ORDER — FUROSEMIDE 10 MG/ML IJ SOLN
40.0000 mg | Freq: Two times a day (BID) | INTRAMUSCULAR | Status: DC
  Administered 2017-12-07 – 2017-12-08 (×2): 40 mg via INTRAVENOUS
  Filled 2017-12-07 (×2): qty 4

## 2017-12-07 MED ORDER — PREDNISONE 5 MG PO TABS
10.0000 mg | ORAL_TABLET | Freq: Every day | ORAL | Status: DC
  Administered 2017-12-08 – 2017-12-11 (×4): 10 mg via ORAL
  Filled 2017-12-07 (×4): qty 2

## 2017-12-07 MED ORDER — COLCHICINE 0.6 MG PO TABS
1.2000 mg | ORAL_TABLET | Freq: Once | ORAL | Status: AC
  Administered 2017-12-07: 1.2 mg via ORAL
  Filled 2017-12-07: qty 2

## 2017-12-07 MED ORDER — COLCHICINE 0.6 MG PO TABS
0.6000 mg | ORAL_TABLET | Freq: Once | ORAL | Status: AC
  Administered 2017-12-07: 0.6 mg via ORAL
  Filled 2017-12-07: qty 1

## 2017-12-07 MED ORDER — POTASSIUM CHLORIDE CRYS ER 20 MEQ PO TBCR
20.0000 meq | EXTENDED_RELEASE_TABLET | Freq: Every day | ORAL | Status: DC
  Administered 2017-12-07 – 2017-12-11 (×5): 20 meq via ORAL
  Filled 2017-12-07 (×5): qty 1

## 2017-12-07 NOTE — ED Notes (Signed)
ED TO INPATIENT HANDOFF REPORT  Name/Age/Gender Hunter [name] 47 y.o. male  Code Status    Code Status Orders  (From admission, onward)         Start     Ordered   12/06/17 2106  Full code  Continuous     12/06/17 2107        Code Status History    Date Active Date Inactive Code Status Order ID Comments User Context   11/05/2016 2001 11/17/2016 2055 Full Code [id number]  Choi, Jennifer Chahn-Yang, DO ED      Home/SNF/Other Home  Chief Complaint ANKLE PAIN   Level of Care/Admitting Diagnosis ED Disposition    ED Disposition Condition Comment   Admit  Hospital Area: [facility] [[id number]]  Level of Care: Telemetry [5]  Admit to tele based on following criteria: Other see comments  Comments: Hypertensive urgency  Diagnosis: Hypertensive urgency [650126]  Admitting Physician: NIU, XILIN [[id number]]  Attending Physician: NIU, XILIN [[id number]]  PT Class (Do Not Modify): Observation [104]  PT Acc Code (Do Not Modify): Observation [10022]       Medical History Past Medical History:  Diagnosis Date  . Arthritis    feet  . CHF (congestive heart failure) (HCC)   . Dyspnea   . Hypertension   . Nephrotic syndrome   . Pre-diabetes   . Sarcoid   . Sarcoidosis   . Sleep apnea     Allergies No Known Allergies  IV Location/Drains/Wounds Patient Lines/Drains/Airways Status   Active Line/Drains/Airways    Name:   Placement date:   Placement time:   Site:   Days:   Peripheral IV 12/06/17 Left Antecubital   12/06/17    1554    Antecubital   1   Incision (Closed) 05/05/15 Flank Left   05/05/15    1141     947   Incision (Closed) 05/03/17 Thigh Left   05/03/17    1251     218   Incision (Closed) 05/03/17 Arm Left   05/03/17    1251     218   Pressure Injury 11/09/16 Stage I -  Intact skin with non-blanchable redness of a localized area usually over a bony prominence.    11/09/16    0800     393          Labs/Imaging Results for orders placed or  performed during the hospital encounter of 12/06/17 (from the past 48 hour(s))  Comprehensive metabolic panel     Status: Abnormal   Collection Time: 12/06/17  3:54 PM  Result Value Ref Range   Sodium 140 135 - 145 mmol/L   Potassium 4.0 3.5 - 5.1 mmol/L   Chloride 98 98 - 111 mmol/L   CO2 36 (H) 22 - 32 mmol/L   Glucose, Bld 91 70 - 99 mg/dL   BUN 16 6 - 20 mg/dL   Creatinine, Ser 0.91 0.61 - 1.24 mg/dL   Calcium 8.3 (L) 8.9 - 10.3 mg/dL   Total Protein 6.8 6.5 - 8.1 g/dL   Albumin 3.5 3.5 - 5.0 g/dL   AST 27 15 - 41 U/L   ALT 26 0 - 44 U/L   Alkaline Phosphatase 64 38 - 126 U/L   Total Bilirubin 1.3 (H) 0.3 - 1.2 mg/dL   GFR calc non Af Amer >60 >60 mL/min   GFR calc Af Amer >60 >60 mL/min    Comment: (NOTE) The eGFR has been calculated using the CKD EPI equation. This   calculation has not been validated in all clinical situations. eGFR's persistently <60 mL/min signify possible Chronic Kidney Disease.    Anion gap 6 5 - 15    Comment: Performed at [facility], [address] [place], [place] [address]  Protime-INR     Status: Abnormal   Collection Time: 12/06/17  3:54 PM  Result Value Ref Range   Prothrombin Time 11.0 (L) 11.4 - 15.2 seconds   INR 0.80     Comment: Performed at [facility], [address] [place], [place] [address]  Magnesium     Status: None   Collection Time: 12/06/17  3:55 PM  Result Value Ref Range   Magnesium 2.1 1.7 - 2.4 mg/dL    Comment: Performed at [facility], [address] [place], [place] [address]  Brain natriuretic peptide (order ONLY if patient c/o SOB)     Status: None   Collection Time: 12/06/17  3:55 PM  Result Value Ref Range   B Natriuretic Peptide 72.6 0.0 - 100.0 pg/mL    Comment: Performed at [facility], [address] [place], [place] [address]  Troponin I     Status: Abnormal   Collection Time: 12/06/17  3:55 PM  Result Value Ref Range    Troponin I 0.03 (HH) <0.03 ng/mL    Comment: CRITICAL RESULT CALLED TO, READ BACK BY AND VERIFIED WITH: WATERS,Q AT 1743 ON 12/06/2017 BY MOSLEY,J Performed at [facility], [address] [place], [place] [address]   CBC with Differential/Platelet     Status: Abnormal   Collection Time: 12/06/17  3:55 PM  Result Value Ref Range   WBC 7.3 4.0 - 10.5 K/uL   RBC 4.81 4.22 - 5.81 MIL/uL   Hemoglobin 14.0 13.0 - 17.0 g/dL   HCT 47.0 39.0 - 52.0 %   MCV 97.7 80.0 - 100.0 fL   MCH 29.1 26.0 - 34.0 pg   MCHC 29.8 (L) 30.0 - 36.0 g/dL   RDW 13.4 11.5 - 15.5 %   Platelets 145 (L) 150 - 400 K/uL   nRBC 0.0 0.0 - 0.2 %   Neutrophils Relative % 74 %   Neutro Abs 5.5 1.7 - 7.7 K/uL   Lymphocytes Relative 11 %   Lymphs Abs 0.8 0.7 - 4.0 K/uL   Monocytes Relative 11 %   Monocytes Absolute 0.8 0.1 - 1.0 K/uL   Eosinophils Relative 2 %   Eosinophils Absolute 0.1 0.0 - 0.5 K/uL   Basophils Relative 1 %   Basophils Absolute 0.0 0.0 - 0.1 K/uL   Immature Granulocytes 1 %   Abs Immature Granulocytes 0.07 0.00 - 0.07 K/uL    Comment: Performed at [facility], [address] [place], [place] [address]  Uric acid     Status: Abnormal   Collection Time: 12/06/17  3:55 PM  Result Value Ref Range   Uric Acid, Serum 9.6 (H) 3.7 - 8.6 mg/dL    Comment: [facility] at  [facility], [address] [place], [place] [address]  Blood gas, venous     Status: Abnormal   Collection Time: 12/06/17  6:00 PM  Result Value Ref Range   pH, Ven 7.348 7.250 - 7.430   pCO2, Ven 67.1 (H) 44.0 - 60.0 mmHg   pO2, Ven 33.5 32.0 - 45.0 mmHg   Bicarbonate 35.9 (H) 20.0 - 28.0 mmol/L   Acid-Base Excess 7.9 (H) 0.0 - 2.0 mmol/L     O2 Saturation 59.5 %   Patient temperature 98.6    Collection site VENOUS    Drawn by DRAWN BY RN    Sample type VENOUS     Comment: Performed at [facility], [address] [place], [place] [address]  Troponin I      Status: Abnormal   Collection Time: 12/06/17  8:38 PM  Result Value Ref Range   Troponin I 0.03 (HH) <0.03 ng/mL    Comment: CRITICAL VALUE NOTED.  VALUE IS CONSISTENT WITH PREVIOUSLY REPORTED AND CALLED VALUE. Performed at [facility], [address] [place], [place] [address]   Urine rapid drug screen (hosp performed)     Status: Abnormal   Collection Time: 12/06/17  9:46 PM  Result Value Ref Range   Opiates POSITIVE (A) NONE DETECTED   Cocaine NONE DETECTED NONE DETECTED   Benzodiazepines NONE DETECTED NONE DETECTED   Amphetamines NONE DETECTED NONE DETECTED   Tetrahydrocannabinol NONE DETECTED NONE DETECTED   Barbiturates NONE DETECTED NONE DETECTED    Comment: (NOTE) DRUG SCREEN FOR MEDICAL PURPOSES ONLY.  IF CONFIRMATION IS NEEDED FOR ANY PURPOSE, NOTIFY LAB WITHIN 5 DAYS. LOWEST DETECTABLE LIMITS FOR URINE DRUG SCREEN Drug Class                     Cutoff (ng/mL) Amphetamine and metabolites    1000 Barbiturate and metabolites    200 Benzodiazepine                 200 Tricyclics and metabolites     300 Opiates and metabolites        300 Cocaine and metabolites        300 THC                            50 Performed at [facility], [address] [place], [place] [address]   Troponin I (q 6hr x 3)     Status: Abnormal   Collection Time: 12/06/17  9:46 PM  Result Value Ref Range   Troponin I 0.04 (HH) <0.03 ng/mL    Comment: CRITICAL VALUE NOTED.  VALUE IS CONSISTENT WITH PREVIOUSLY REPORTED AND CALLED VALUE. Performed at [facility], [address] [place], [place] [address]   Culture, blood (Routine X 2) w Reflex to ID Panel     Status: None (Preliminary result)   Collection Time: 12/06/17  9:46 PM  Result Value Ref Range   Specimen Description      BLOOD LEFT ANTECUBITAL Performed at [facility], [address] [place], [place] [address]    Special Requests      BOTTLES DRAWN  AEROBIC AND ANAEROBIC Blood Culture adequate volume Performed at [facility], [address] [place], [place] [address]    Culture PENDING    Report Status PENDING   CBG monitoring, ED     Status: Abnormal   Collection Time: 12/06/17 10:41 PM  Result Value Ref Range   Glucose-Capillary 244 (H) 70 - 99 mg/dL  Sedimentation rate     Status: Abnormal   Collection Time: 12/07/17  1:37 AM  Result Value Ref Range   Sed Rate 23 (H) 0 - 16 mm/hr    Comment: Performed at  [facility], [address] [place],  [address]  C-reactive protein     Status: Abnormal   Collection Time: 12/07/17  1:37   AM  Result Value Ref Range   CRP 14.6 (H) <1.0 mg/dL    Comment: Performed at [facility], [place] [address], [place], [place] [address]  Hemoglobin A1c     Status: Abnormal   Collection Time: 12/07/17  2:06 AM  Result Value Ref Range   Hgb A1c MFr Bld 6.1 (H) 4.8 - 5.6 %    Comment: (NOTE) Pre diabetes:          5.7%-6.4% Diabetes:              >6.4% Glycemic control for   <7.0% adults with diabetes    Mean Plasma Glucose 128.37 mg/dL    Comment: Performed at [facility] [address], [place], [place] [address]  Lipid panel     Status: Abnormal   Collection Time: 12/07/17  2:06 AM  Result Value Ref Range   Cholesterol 287 (H) 0 - 200 mg/dL   Triglycerides 96 <150 mg/dL   HDL 73 >40 mg/dL   Total CHOL/HDL Ratio 3.9 RATIO   VLDL 19 0 - 40 mg/dL   LDL Cholesterol 195 (H) 0 - 99 mg/dL    Comment:        Total Cholesterol/HDL:CHD Risk Coronary Heart Disease Risk Table                     Men   Women  1/2 Average Risk   3.4   3.3  Average Risk       5.0   4.4  2 X Average Risk   9.6   7.1  3 X Average Risk  23.4   11.0        Use the calculated Patient Ratio above and the CHD Risk Table to determine the patient's CHD Risk.        ATP III CLASSIFICATION (LDL):  <100     mg/dL   Optimal  100-129  mg/dL   Near or Above                     Optimal  130-159  mg/dL   Borderline  160-189  mg/dL   High  >190     mg/dL   Very High Performed at [facility] [address], [place], [place] [address]   Troponin I (q 6hr x 3)     Status: Abnormal   Collection Time: 12/07/17  2:06 AM  Result Value Ref Range   Troponin I 0.03 (HH) <0.03 ng/mL    Comment: CRITICAL VALUE NOTED.  VALUE IS CONSISTENT WITH PREVIOUSLY REPORTED AND CALLED VALUE. Performed at [facility], [place] [address] [place], [place] [address]   Cortisol-am, blood     Status: Abnormal   Collection Time: 12/07/17  2:06 AM  Result Value Ref Range   Cortisol - AM 4.2 (L) 6.7 - 22.6 ug/dL    Comment: Performed at [facility] [address], [place], [place] [address]   Dg Chest 2 View  Result Date: 12/06/2017 CLINICAL DATA:  Shortness of breath EXAM: CHEST - 2 VIEW COMPARISON:  08/05/2017 FINDINGS: Mild cardiomegaly with bilateral hilar enlargement. There is bilateral interstitial opacities, slightly worsened from the prior study. Chronic right lung volume loss with small pleural effusion. IMPRESSION: 1. Cardiomegaly with increased interstitial opacities, likely mild interstitial pulmonary edema superimposed on chronic interstitial changes related sarcoidosis. 2. Chronic right lung volume loss with small pleural effusion. Electronically Signed   By: Lennette Bihari  [place] M.D.   On: 12/06/2017 16:45   Ct Angio Chest Pe W And/or Wo Contrast  Result Date: 12/06/2017 CLINICAL DATA:  Bilateral ankle pain and swelling x2 days. Patient ran out of CHF medication yesterday. History of sarcoid. EXAM: CT ANGIOGRAPHY CHEST WITH CONTRAST TECHNIQUE: Multidetector CT imaging of the chest was performed using the standard protocol during bolus administration of intravenous contrast. Multiplanar CT image reconstructions and MIPs were obtained to evaluate the vascular anatomy. CONTRAST:  110m ISOVUE-370 IOPAMIDOL (ISOVUE-370) INJECTION 76%  COMPARISON:  11/11/2016 FINDINGS: Cardiovascular: Chronic dilatation of the main pulmonary artery to 4.3 cm consistent with chronic pulmonary hypertension. No large central pulmonary embolus is identified. Opacification of the pulmonary arteries is limited beyond the lobar level due to motion artifacts. Small peripheral pulmonary emboli would be difficult to entirely exclude but is not conclusive. Heart size is enlarged without pericardial effusion. Stable appearance of the thoracic aorta without significant atherosclerotic calcifications. No coronary arterial calcifications. Mediastinum/Nodes: No enlarged mediastinal, hilar, or axillary lymph nodes. Thyroid gland, trachea, and esophagus demonstrate no significant findings. Lungs/Pleura: Chronic right-sided pleural thickening calcifications similar prior exam. Trace right-sided pleural effusion. Chronic pleuroparenchymal architectural distortion and linear scarring along periphery the right lung and periphery of left upper lobe similar in appearance to prior. Some scarring along the periphery of the left lung is calcified. No suspicious pulmonary nodules or masses. Upper Abdomen: Calcified retroperitoneal lymph nodes similar to prior. Musculoskeletal: No aggressive osseous lesions.  No acute fracture. Review of the MIP images confirms the above findings. IMPRESSION: 1. Cardiomegaly with chronic marked dilatation of the main pulmonary artery compatible pulmonary hypertension. 2. No large central pulmonary embolus is identified. Peripheral pulmonary arteries are limited due to timing of contrast motion artifacts. 3. Subpleural areas of scarring bilaterally as above. 4. Stigmata of chronic sarcoidosis given calcified lymph nodes in the upper abdomen. Electronically Signed   By: DAshley RoyaltyM.D.   On: 12/06/2017 19:55   Dg Foot Complete Right  Result Date: 12/06/2017 CLINICAL DATA:  Generalized right foot pain for 3 days. EXAM: RIGHT FOOT COMPLETE - 3+ VIEW  COMPARISON:  March 01, 2017 FINDINGS: There is no evidence of fracture or dislocation. Small plantar calcaneal spur is noted. Soft tissues are unremarkable. IMPRESSION: No acute fracture dislocation. Electronically Signed   By: WAbelardo DieselM.D.   On: 12/06/2017 16:41    Pending Labs Unresulted Labs (From admission, onward)    Start     Ordered   12/06/17 2121  Culture, blood (Routine X 2) w Reflex to ID Panel  BLOOD CULTURE X 2,   R    Comments:  Please obtain prior to antibiotic administration.    12/06/17 2120   12/06/17 2106  HIV antibody (Routine Testing)  Once,   R     12/06/17 2107   12/06/17 2104  Troponin I (q 6hr x 3)  Now then every 6 hours,   R     12/06/17 2103          Vitals/Pain Today's Vitals   12/07/17 0343 12/07/17 0618 12/07/17 0618 12/07/17 0622  BP: (!) 150/94  (!) 146/102 (!) 146/102  Pulse: 67  77 69  Resp: _0 Temp:      TempSrc:      SpO2: 96%  97% 97%  Weight:      Height:      PainSc:  5  5      Isolation Precautions No active isolations  Medications Medications  sodium chloride 0.9 % injection (has no administration in time range)  iopamidol (ISOVUE-370) 76 % injection (has no administration in time range)  hydrALAZINE (APRESOLINE) injection 5 mg (5 mg Intravenous Given 12/07/17 0107)  oxyCODONE-acetaminophen (PERCOCET/ROXICET) 5-325 MG per tablet 1 tablet (has no administration in time range)  torsemide (DEMADEX) tablet 40 mg (has no administration in time range)  magnesium oxide (MAG-OX) tablet 400 mg (400 mg Oral Given 12/07/17 0107)  aspirin EC tablet 325 mg (325 mg Oral Given 12/06/17 2303)  nitroGLYCERIN (NITROSTAT) SL tablet 0.4 mg (has no administration in time range)  morphine 2 MG/ML injection 2 mg (2 mg Intravenous Given 12/07/17 0327)  dextromethorphan-guaiFENesin (MUCINEX DM) 30-600 MG per 12 hr tablet 1 tablet (has no administration in time range)  nicotine (NICODERM CQ - dosed in mg/24 hours) patch 21 mg (21 mg  Transdermal Refused 12/07/17 0106)  methylPREDNISolone sodium succinate (SOLU-MEDROL) 125 mg/2 mL injection 60 mg (has no administration in time range)  acetaminophen (TYLENOL) tablet 650 mg (has no administration in time range)  ondansetron (ZOFRAN) injection 4 mg (has no administration in time range)  enoxaparin (LOVENOX) injection 40 mg (has no administration in time range)  zolpidem (AMBIEN) tablet 5 mg (has no administration in time range)  ALPRAZolam (XANAX) tablet 0.25 mg (has no administration in time range)  insulin aspart (novoLOG) injection 0-9 Units (has no administration in time range)  insulin aspart (novoLOG) injection 0-5 Units (2 Units Subcutaneous Given 12/06/17 2304)  amLODipine (NORVASC) tablet 10 mg (10 mg Oral Given 12/06/17 2303)  albuterol (PROVENTIL) (2.5 MG/3ML) 0.083% nebulizer solution 2.5 mg (has no administration in time range)  HYDROcodone-acetaminophen (NORCO/VICODIN) 5-325 MG per tablet 1 tablet (1 tablet Oral Given 12/06/17 1541)  oxyCODONE-acetaminophen (PERCOCET/ROXICET) 5-325 MG per tablet 1 tablet (1 tablet Oral Given 12/06/17 1757)  furosemide (LASIX) injection 40 mg (40 mg Intravenous Given 12/06/17 1802)  methylPREDNISolone sodium succinate (SOLU-MEDROL) 125 mg/2 mL injection 125 mg (125 mg Intravenous Given 12/06/17 1950)  iopamidol (ISOVUE-370) 76 % injection 100 mL (100 mLs Intravenous Contrast Given 12/06/17 1926)  oxyCODONE (Oxy IR/ROXICODONE) immediate release tablet 5 mg (5 mg Oral Given 12/06/17 2039)    Mobility manual wheelchair  

## 2017-12-07 NOTE — Care Management Note (Signed)
Case Management Note  Patient Details  Name: Hunter [name] MRN: [id number] Date of Birth: 1970/06/28  Subjective/Objective: Admitted w/HTN urgency. From [facility]shelter, has home 02-[facility]-has travel tank. Has pcp-[facility]-can make own appt, pharmacy-[facility]. Will need taxi voucher @ d/c-CSW notified.                   Action/Plan:d/c back to homeless shelter.   Expected Discharge Date:  (unknown)               Expected Discharge Plan:  Homeless Shelter  In-House Referral:  Clinical Social Work  Discharge planning Services  CM Consult, [facility]  Post Acute Care Choice:  Durable Medical Equipment([facility]-home 02-travel tank.) Choice offered to:     DME Arranged:    DME Agency:     HH Arranged:    HH Agency:     Status of Service:  Completed, signed off  If discussed at [employer], dates discussed:    Additional Comments:  Lanier Clam, RN 12/07/2017, 3:39 PM

## 2017-12-07 NOTE — ED Notes (Signed)
4 EAST AWARE OF PT 'S ARRIVAL

## 2017-12-07 NOTE — Telephone Encounter (Signed)
Pt want's to talk to RB [phone]   Pt is getting very frustrated

## 2017-12-07 NOTE — ED Notes (Signed)
All 5am morning labs collected now with permission by Amy in lab.

## 2017-12-07 NOTE — Telephone Encounter (Signed)
Spoke with the pt  He wants RB to be aware that he was hospitalized for feet swelling and trouble walking due to feet swelling  He wants RB to call him asap to discuss medications  I advised RB out of the office until this afternoon but I will forward him msg marked urgent

## 2017-12-07 NOTE — ED Notes (Signed)
Bed: WA20 Expected date:  Expected time:  Means of arrival:  Comments: Room 4 

## 2017-12-07 NOTE — Progress Notes (Signed)
Preliminary notes--Bilateral lower extremities venous duplex exam completed. Negative for DVT.  [name] H [name](RDMS RVT) 12/07/17 2:48 PM

## 2017-12-07 NOTE — Telephone Encounter (Signed)
I called [name], he is been admitted with an apparent hypertensive crisis, some mild pulmonary edema and some more significant lower extremity edema with associated pain.  Not clear to me how long he has been hypertensive, he does not check his blood pressure at home.  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  I started tapering this at our last outpatient visit from 40 mg, now on 20 mg daily. I think it would be best to continue to taper him, goal to off over about 7-10 days. I called Dr Maryfrances Bunnell his attending MD to discuss, solidify this plan

## 2017-12-07 NOTE — Progress Notes (Signed)
PROGRESS NOTE    Tedd Cottrill  ZOX:[id number] DOB: [birth year] DOA: 12/06/2017 PCP: Kallie Locks, FNP      Brief Narrative:  Hunter [name] is a 47 y.o. 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        Assessment & Plan:  Acute right heart failure Patient presents with weight gain (120kg at last CHF appointment, up to 130kg recently, 126 kg on admission), as well as early satiety, bloating, exertional fatigue, and bilateral leg swelling.  He is frustrated that he has these symptoms, which are similar to his previous more severe episodes of CHF, despite adherence to his home torsemide, salt restrictions.  Of note, he was on prednisone for an extended time for an inflammatory myopathy recently.  US doppler negative for DVT.   CTA negative for PE  -Furosemide 40 mg IV twice a day  -K supplement -Strict I/Os, daily weights, telemetry  -Daily monitoring renal function -Consult Cardiology, appreciate cares   Elevated troponin Suspect this is demand supply mismatch given BP >200/100 at admission.  Troponin low and lfat.  Resolved now.  No exertional chest discomfort  Hypertensive urgency Patient fixates on his good med adherence. Other than torsemide, he is not on a BP med. 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  Not candidate for vasodilators. Neurology recently biopsied a muscle due to what they thought was proximal weakness, found he has some inflammatory  myopathy.  Not clear what this is, but he was started on prednisone 40 for a prolonged taper.  Discussed with Dr. Delton Coombes today, will taper this over next 7-10 days. -Prednisone 10 mg daily for 10 days then stop  OHS/OSA -Continue BiPAP at night  Chronic diastolic CHF -Continue aspirin  Pre-diabetes -Hold home metformin -SSI ordered  Right foot pain, possible gout Xray negative.  Some consideration overnight for gout, but he got just one dose Solu-medrol and his foot is now perfectly fine, no pain at all, even when I really mash on his 1st MTP.  Mild redness over the MTP, mild redness on the dorsum, no residual tenderness. -Stop steroids -Colchicine 1.2 then 0.6  Elevated uric acid Although I'm somewhat skeptical that this was an episode of gout, if he has previous gout flare, recommend urate lowering therapy.      MDM and disposition: The below labs and imaging reports were reviewed and summarized above.  Medication management as above.  The patient was admitted with acute right sided heart failure.  He remains dyspneic at exertion with leg swelling despite IV lasix therapy during the observation period.  He has a severe exacerbation of his chronic illness (RHF) in the setting of advanced sarcoid, chronic hypoxic respiratory failure, OHS/OSA and evidence of right heart strain from elevated troponin. Warrants continued inpatient cares.  Case was discussed with Pulmonlogy Dr. Delton Coombes and Cardiology.      DVT prophylaxis: Lovenox Code Status: FULL Family Communication: None present    Consultants:   Cardiology  Procedures:   [place] legs, normal  Antimicrobials:  None    Subjective: Feeling still swelling in legs.  Still bloated, still dyspneic with exertion.  No confusion, chest pain, fever, cough, joint pain, joint swelling.  Objective: Vitals:   12/07/17 0622 12/07/17 0738 12/07/17 0835 12/07/17 1549  BP: (!) 146/102 (!) 150/95 (!) 148/88 (!) 141/83  Pulse: 69 70  72 82  Resp: 15 18 18 20   Temp:   98.2 F (36.8 C) 98.9 F (37.2 C)  TempSrc:   Oral Oral  SpO2: 97% 97% 93% 95%  Weight:      Height:        Intake/Output Summary (Last 24 hours) at 12/07/2017 1723 Last data filed at 12/07/2017 1100 Gross per 24 hour  Intake -  Output 1950 ml  Net -1950 ml   Filed Weights   12/06/17 1437  Weight: 126.1 kg    Examination: General appearance: Obese adult male, alert and in no acute distress.   HEENT: Anicteric, conjunctiva pink, lids and lashes normal. No nasal deformity, discharge, epistaxis.  Lips moist.   Skin: Warm and dry.  no jaundice.  No suspicious rashes or lesions. Cardiac: RRR, nl S1-S2, sioft systolic murmur.  Capillary refill is brisk.  JVP not visible.  1+ LE edema.  Radia  pulses 2+ and symmetric. Respiratory: Normal respiratory rate and rhythm at rest, appears dyspneic, fatigued with exertion.  CTAB without rales or wheezes. Abdomen: Abdomen soft.  no TTP. No ascites, distension, hepatosplenomegaly.   MSK: No deformities or effusions. Neuro: Awake and alert.  EOMI, moves all extremities. Speech fluent.    Psych: Sensorium intact and responding to questions, attention normal. Affect normal.  Judgment and insight appear normal=.    Data Reviewed: I have personally reviewed following labs and imaging studies:  CBC: Recent Labs  Lab 12/06/17 1555  WBC 7.3  NEUTROABS 5.5  HGB 14.0  HCT 47.0  MCV 97.7  PLT 145*   Basic Metabolic Panel: Recent Labs  Lab 12/06/17 1554 12/06/17 1555  NA 140  --   K 4.0  --   CL 98  --   CO2 36*  --   GLUCOSE 91  --   BUN 16  --   CREATININE 0.91  --   CALCIUM 8.3*  --   MG  --  2.1   GFR: Estimated Creatinine Clearance: 134.7 mL/min (by C-G formula based on SCr of 0.91 mg/dL). Liver Function Tests: Recent Labs  Lab 12/06/17 1554  AST 27  ALT 26  ALKPHOS 64  BILITOT 1.3*  PROT 6.8  ALBUMIN 3.5   No results for input(s): LIPASE, AMYLASE in the last 168 hours. No  results for input(s): AMMONIA in the last 168 hours. Coagulation Profile: Recent Labs  Lab 12/06/17 1554  INR 0.80   Cardiac Enzymes: Recent Labs  Lab 12/06/17 1555 12/06/17 2038 12/06/17 2146 12/07/17 0206 12/07/17 1005  TROPONINI 0.03* 0.03* 0.04* 0.03* <0.03   BNP (last 3 results) No results for input(s): PROBNP in the last 8760 hours. HbA1C: Recent Labs    12/07/17 0206  HGBA1C 6.1*   CBG: Recent Labs  Lab 12/06/17 2241 12/07/17 0904 12/07/17 1236 12/07/17 1713  GLUCAP 244* 161* 119* 211*   Lipid Profile: Recent Labs    12/07/17 0206  CHOL 287*  HDL 73  LDLCALC 195*  TRIG 96  CHOLHDL 3.9   Thyroid Function Tests: No results for input(s): TSH, T4TOTAL, FREET4, T3FREE, THYROIDAB in the last 72 hours. Anemia Panel: No results for input(s): VITAMINB12, FOLATE, FERRITIN, TIBC,  IRON, RETICCTPCT in the last 72 hours. Urine analysis:    Component Value Date/Time   LABSPEC 1.015 11/23/2016 1028   PHURINE 7.0 11/23/2016 1028   GLUCOSEU NEGATIVE 11/23/2016 1028   HGBUR NEGATIVE 11/23/2016 1028   BILIRUBINUR negative 11/08/2017 1149   KETONESUR negative 11/08/2017 1149   KETONESUR NEGATIVE 11/23/2016 1028   PROTEINUR =100 (A) 11/08/2017 1149   PROTEINUR 100 (A) 11/23/2016 1028   UROBILINOGEN 0.2 11/08/2017 1149   UROBILINOGEN 0.2 11/23/2016 1028   NITRITE Negative 11/08/2017 1149   NITRITE NEGATIVE 11/23/2016 1028   LEUKOCYTESUR Negative 11/08/2017 1149   Sepsis Labs: @LABRCNTIP (procalcitonin:4,lacticacidven:4)  ) Recent Results (from the past 240 hour(s))  Culture, blood (Routine X 2) w Reflex to ID Panel     Status: None (Preliminary result)   Collection Time: 12/06/17  9:46 PM  Result Value Ref Range Status   Specimen Description   Final    BLOOD LEFT ANTECUBITAL Performed at [facility], [address], [place], [place] [address]    Special Requests   Final    BOTTLES DRAWN AEROBIC AND ANAEROBIC Blood Culture adequate  volume Performed at [facility], [address], [place], [place] [address]    Culture PENDING  Incomplete   Report Status PENDING  Incomplete  MRSA PCR Screening     Status: None   Collection Time: 12/07/17  9:39 AM  Result Value Ref Range Status   MRSA by PCR NEGATIVE NEGATIVE Final    Comment:        The GeneXpert MRSA Assay (FDA approved for NASAL specimens only), is one component of a comprehensive MRSA colonization surveillance program. It is not intended to diagnose MRSA infection nor to guide or monitor treatment for MRSA infections. Performed at [facility], [address], [place], [place] [address]          Radiology Studies: Dg Chest 2 View  Result Date: 12/06/2017 CLINICAL DATA:  Shortness of breath EXAM: CHEST - 2 VIEW COMPARISON:  08/05/2017 FINDINGS: Mild cardiomegaly with bilateral hilar enlargement. There is bilateral interstitial opacities, slightly worsened from the prior study. Chronic right lung volume loss with small pleural effusion. IMPRESSION: 1. Cardiomegaly with increased interstitial opacities, likely mild interstitial pulmonary edema superimposed on chronic interstitial changes related sarcoidosis. 2. Chronic right lung volume loss with small pleural effusion. Electronically Signed   By: Deatra Robinson M.D.   On: 12/06/2017 16:45   Ct Angio Chest Pe W And/or Wo Contrast  Result Date: 12/06/2017 CLINICAL DATA:  Bilateral ankle pain and swelling x2 days. Patient ran out of CHF medication yesterday. History of sarcoid. EXAM: CT ANGIOGRAPHY CHEST WITH CONTRAST TECHNIQUE: Multidetector CT imaging of the chest was performed using the standard protocol during bolus administration of intravenous contrast. Multiplanar CT image reconstructions and MIPs were obtained to evaluate the vascular anatomy. CONTRAST:  ISOVUE-370 IOPAMIDOL (ISOVUE-370) INJECTION 76% COMPARISON:  11/11/2016 FINDINGS: Cardiovascular: Chronic dilatation of  the main pulmonary artery to 4.3 cm consistent with chronic pulmonary hypertension. No large central pulmonary embolus is identified. Opacification of the pulmonary arteries is limited beyond the lobar level due to motion artifacts. Small peripheral pulmonary emboli would be difficult to entirely exclude but is not conclusive. Heart size is enlarged without pericardial effusion. Stable appearance of the thoracic aorta without significant atherosclerotic calcifications. No coronary arterial calcifications. Mediastinum/Nodes: No enlarged mediastinal, hilar, or axillary lymph nodes. Thyroid gland, trachea, and esophagus demonstrate no significant findings. Lungs/Pleura: Chronic right-sided pleural thickening calcifications  similar prior exam. Trace right-sided pleural effusion. Chronic pleuroparenchymal architectural distortion and linear scarring along periphery the right lung and periphery of left upper lobe similar in appearance to prior. Some scarring along the periphery of the left lung is calcified. No suspicious pulmonary nodules or masses. Upper Abdomen: Calcified retroperitoneal lymph nodes similar to prior. Musculoskeletal: No aggressive osseous lesions.  No acute fracture. Review of the MIP images confirms the above findings. IMPRESSION: 1. Cardiomegaly with chronic marked dilatation of the main pulmonary artery compatible pulmonary hypertension. 2. No large central pulmonary embolus is identified. Peripheral pulmonary arteries are limited due to timing of contrast motion artifacts. 3. Subpleural areas of scarring bilaterally as above. 4. Stigmata of chronic sarcoidosis given calcified lymph nodes in the upper abdomen. Electronically Signed   By: Tollie Eth M.D.   On: 12/06/2017 19:55   Dg Foot Complete Right  Result Date: 12/06/2017 CLINICAL DATA:  Generalized right foot pain for 3 days. EXAM: RIGHT FOOT COMPLETE - 3+ VIEW COMPARISON:  March 01, 2017 FINDINGS: There is no evidence of fracture or  dislocation. Small plantar calcaneal spur is noted. Soft tissues are unremarkable. IMPRESSION: No acute fracture dislocation. Electronically Signed   By: Sherian Rein M.D.   On: 12/06/2017 16:41   Vas [place] Lower Extremity Venous (dvt)  Result Date: 12/07/2017  Lower Venous Study Indications: Swelling, and Edema.  Limitations: Body habitus and musculoskeletal features. Performing Technologist: Annamaria Helling  Examination Guidelines: A complete evaluation includes B-mode imaging, spectral Doppler, color Doppler, and power Doppler as needed of all accessible portions of each vessel. Bilateral testing is considered an integral part of a complete examination. Limited examinations for reoccurring indications may be performed as noted.  Right Venous Findings: +---------+---------------+---------+-----------+----------+-------+          CompressibilityPhasicitySpontaneityPropertiesSummary +---------+---------------+---------+-----------+----------+-------+ CFV      Full           Yes      Yes                          +---------+---------------+---------+-----------+----------+-------+ SFJ      Full                                                 +---------+---------------+---------+-----------+----------+-------+ FV Prox  Full                                                 +---------+---------------+---------+-----------+----------+-------+ FV Mid   Full                                                 +---------+---------------+---------+-----------+----------+-------+ FV DistalFull                                                 +---------+---------------+---------+-----------+----------+-------+ PFV      Full                                                 +---------+---------------+---------+-----------+----------+-------+  POP      Full           Yes      Yes                          +---------+---------------+---------+-----------+----------+-------+ PTV       Full                                                 +---------+---------------+---------+-----------+----------+-------+ PERO     Full                                                 +---------+---------------+---------+-----------+----------+-------+  Left Venous Findings: +---------+---------------+---------+-----------+----------+-------+          CompressibilityPhasicitySpontaneityPropertiesSummary +---------+---------------+---------+-----------+----------+-------+ CFV      Full           Yes      Yes                          +---------+---------------+---------+-----------+----------+-------+ SFJ      Full                                                 +---------+---------------+---------+-----------+----------+-------+ FV Prox  Full                                                 +---------+---------------+---------+-----------+----------+-------+ FV Mid   Full                                                 +---------+---------------+---------+-----------+----------+-------+ FV DistalFull                                                 +---------+---------------+---------+-----------+----------+-------+ PFV      Full                                                 +---------+---------------+---------+-----------+----------+-------+ POP      Full           Yes      Yes                          +---------+---------------+---------+-----------+----------+-------+ PTV      Full                                                 +---------+---------------+---------+-----------+----------+-------+  PERO     Full                                                 +---------+---------------+---------+-----------+----------+-------+    Summary: Right: There is no evidence of deep vein thrombosis in the lower extremity. However, portions of this examination were limited- see technologist comments above. No cystic structure found in the  popliteal fossa. Left: There is no evidence of deep vein thrombosis in the lower extremity. However, portions of this examination were limited- see technologist comments above. No cystic structure found in the popliteal fossa.  *See table(s) above for measurements and observations. Electronically signed by Gretta Began MD on 12/07/2017 at 3:32:27 PM.    Final         Scheduled Meds: . amLODipine  10 mg Oral Daily  . aspirin EC  325 mg Oral Daily  . enoxaparin (LOVENOX) injection  60 mg Subcutaneous Q24H  . furosemide  40 mg Intravenous BID  . insulin aspart  0-5 Units Subcutaneous QHS  . insulin aspart  0-9 Units Subcutaneous TID WC  . magnesium oxide  400 mg Oral QPM  . nicotine  21 mg Transdermal Daily  . potassium chloride  20 mEq Oral Daily  . [START ON 12/08/2017] predniSONE  10 mg Oral Q breakfast   Continuous Infusions:   LOS: 0 days    Time spent: 35 minutes    Alberteen Sam, MD [facility] 12/07/2017, 5:23 PM     Pager [phone] --- please page though AMION:  [email or web address] If 7PM-7AM, please contact night-coverage

## 2017-12-07 NOTE — Telephone Encounter (Signed)
appt already cancelled  Will forward to RB as FYI

## 2017-12-08 DIAGNOSIS — I2721 Secondary pulmonary arterial hypertension: Secondary | ICD-10-CM

## 2017-12-08 DIAGNOSIS — I160 Hypertensive urgency: Principal | ICD-10-CM

## 2017-12-08 DIAGNOSIS — I5032 Chronic diastolic (congestive) heart failure: Secondary | ICD-10-CM

## 2017-12-08 DIAGNOSIS — I10 Essential (primary) hypertension: Secondary | ICD-10-CM

## 2017-12-08 DIAGNOSIS — R7989 Other specified abnormal findings of blood chemistry: Secondary | ICD-10-CM

## 2017-12-08 LAB — BLOOD CULTURE ID PANEL (REFLEXED)
ACINETOBACTER BAUMANNII: NOT DETECTED
CANDIDA KRUSEI: NOT DETECTED
CANDIDA PARAPSILOSIS: NOT DETECTED
CANDIDA TROPICALIS: NOT DETECTED
Candida albicans: NOT DETECTED
Candida glabrata: NOT DETECTED
ESCHERICHIA COLI: NOT DETECTED
Enterobacter cloacae complex: NOT DETECTED
Enterobacteriaceae species: NOT DETECTED
Enterococcus species: NOT DETECTED
HAEMOPHILUS INFLUENZAE: NOT DETECTED
KLEBSIELLA OXYTOCA: NOT DETECTED
Klebsiella pneumoniae: NOT DETECTED
Listeria monocytogenes: NOT DETECTED
METHICILLIN RESISTANCE: NOT DETECTED
Neisseria meningitidis: NOT DETECTED
PSEUDOMONAS AERUGINOSA: NOT DETECTED
Proteus species: NOT DETECTED
SERRATIA MARCESCENS: NOT DETECTED
STAPHYLOCOCCUS AUREUS BCID: NOT DETECTED
STAPHYLOCOCCUS SPECIES: DETECTED — AB
STREPTOCOCCUS SPECIES: NOT DETECTED
Streptococcus agalactiae: NOT DETECTED
Streptococcus pneumoniae: NOT DETECTED
Streptococcus pyogenes: NOT DETECTED

## 2017-12-08 LAB — RESPIRATORY PANEL BY PCR
ADENOVIRUS-RVPPCR: NOT DETECTED
Bordetella pertussis: NOT DETECTED
CORONAVIRUS 229E-RVPPCR: NOT DETECTED
CORONAVIRUS NL63-RVPPCR: NOT DETECTED
Chlamydophila pneumoniae: NOT DETECTED
Coronavirus HKU1: NOT DETECTED
Coronavirus OC43: NOT DETECTED
INFLUENZA A-RVPPCR: NOT DETECTED
INFLUENZA B-RVPPCR: NOT DETECTED
METAPNEUMOVIRUS-RVPPCR: NOT DETECTED
MYCOPLASMA PNEUMONIAE-RVPPCR: NOT DETECTED
PARAINFLUENZA VIRUS 2-RVPPCR: NOT DETECTED
PARAINFLUENZA VIRUS 4-RVPPCR: NOT DETECTED
Parainfluenza Virus 1: NOT DETECTED
Parainfluenza Virus 3: NOT DETECTED
RESPIRATORY SYNCYTIAL VIRUS-RVPPCR: NOT DETECTED
Rhinovirus / Enterovirus: NOT DETECTED

## 2017-12-08 LAB — CBC
HEMATOCRIT: 44 % (ref 39.0–52.0)
HEMOGLOBIN: 13.6 g/dL (ref 13.0–17.0)
MCH: 29.2 pg (ref 26.0–34.0)
MCHC: 30.9 g/dL (ref 30.0–36.0)
MCV: 94.4 fL (ref 80.0–100.0)
Platelets: 152 10*3/uL (ref 150–400)
RBC: 4.66 MIL/uL (ref 4.22–5.81)
RDW: 13.2 % (ref 11.5–15.5)
WBC: 12.1 10*3/uL — AB (ref 4.0–10.5)
nRBC: 0 % (ref 0.0–0.2)

## 2017-12-08 LAB — GLUCOSE, CAPILLARY
Glucose-Capillary: 107 mg/dL — ABNORMAL HIGH (ref 70–99)
Glucose-Capillary: 91 mg/dL (ref 70–99)
Glucose-Capillary: 223 mg/dL — ABNORMAL HIGH (ref 70–99)
Glucose-Capillary: 196 mg/dL — ABNORMAL HIGH (ref 70–99)

## 2017-12-08 LAB — BASIC METABOLIC PANEL
Anion gap: 8 (ref 5–15)
BUN: 34 mg/dL — ABNORMAL HIGH (ref 6–20)
CHLORIDE: 96 mmol/L — AB (ref 98–111)
CO2: 33 mmol/L — ABNORMAL HIGH (ref 22–32)
CREATININE: 0.94 mg/dL (ref 0.61–1.24)
Calcium: 8.4 mg/dL — ABNORMAL LOW (ref 8.9–10.3)
GFR calc Af Amer: >60 mL/min (ref >60)
GLUCOSE: 168 mg/dL — AB (ref 70–99)
Potassium: 4.1 mmol/L (ref 3.5–5.1)
SODIUM: 137 mmol/L (ref 135–145)

## 2017-12-08 LAB — HIV ANTIBODY (ROUTINE TESTING W REFLEX): HIV Screen 4th Generation wRfx: NONREACTIVE

## 2017-12-08 MED ORDER — FUROSEMIDE 10 MG/ML IJ SOLN
80.0000 mg | Freq: Three times a day (TID) | INTRAMUSCULAR | Status: DC
  Administered 2017-12-08 – 2017-12-09 (×3): 80 mg via INTRAVENOUS
  Filled 2017-12-08 (×3): qty 8

## 2017-12-08 MED ORDER — AZITHROMYCIN 250 MG PO TABS
250.0000 mg | ORAL_TABLET | Freq: Every day | ORAL | Status: DC
  Administered 2017-12-09 – 2017-12-11 (×3): 250 mg via ORAL
  Filled 2017-12-08 (×3): qty 1

## 2017-12-08 MED ORDER — AZITHROMYCIN 250 MG PO TABS
500.0000 mg | ORAL_TABLET | Freq: Every day | ORAL | Status: AC
  Administered 2017-12-08: 500 mg via ORAL
  Filled 2017-12-08: qty 2

## 2017-12-08 MED ORDER — IPRATROPIUM-ALBUTEROL 0.5-2.5 (3) MG/3ML IN SOLN
3.0000 mL | Freq: Four times a day (QID) | RESPIRATORY_TRACT | Status: DC
  Administered 2017-12-08: 3 mL via RESPIRATORY_TRACT
  Filled 2017-12-08: qty 3

## 2017-12-08 MED ORDER — POLYETHYLENE GLYCOL 3350 17 G PO PACK: 17.0000 g | PACK | Freq: Every day | ORAL | Status: DC | PRN

## 2017-12-08 MED ORDER — IPRATROPIUM-ALBUTEROL 0.5-2.5 (3) MG/3ML IN SOLN
3.0000 mL | Freq: Two times a day (BID) | RESPIRATORY_TRACT | Status: DC
  Administered 2017-12-08 – 2017-12-11 (×6): 3 mL via RESPIRATORY_TRACT
  Filled 2017-12-08 (×6): qty 3

## 2017-12-08 MED ORDER — SENNOSIDES-DOCUSATE SODIUM 8.6-50 MG PO TABS: 1.0000 | ORAL_TABLET | Freq: Every evening | ORAL | Status: DC | PRN

## 2017-12-08 NOTE — Progress Notes (Signed)
PHARMACY - PHYSICIAN COMMUNICATION CRITICAL VALUE ALERT - BLOOD CULTURE IDENTIFICATION (BCID)  Hunter [name] is an 47 y.o. male who presented to [facility] on 12/06/2017 with a chief complaint of acute heart failure.   Assessment:  CHF (include suspected source if known) BCID 1 of 4 bottles, staph species, likely contaminant. WBCs WNL, A-febrile  Name of physician (or Provider) Contacted: [name] Boll, NP  Current antibiotics: None  Changes to prescribed antibiotics recommended:  Patient is on recommended antibiotics - No changes needed  Results for orders placed or performed during the hospital encounter of 12/06/17  Blood Culture ID Panel (Reflexed) (Collected: 12/06/2017  9:46 PM)  Result Value Ref Range   Enterococcus species NOT DETECTED NOT DETECTED   Listeria monocytogenes NOT DETECTED NOT DETECTED   Staphylococcus species DETECTED (A) NOT DETECTED   Staphylococcus aureus (BCID) NOT DETECTED NOT DETECTED   Methicillin resistance NOT DETECTED NOT DETECTED   Streptococcus species NOT DETECTED NOT DETECTED   Streptococcus agalactiae NOT DETECTED NOT DETECTED   Streptococcus pneumoniae NOT DETECTED NOT DETECTED   Streptococcus pyogenes NOT DETECTED NOT DETECTED   Acinetobacter baumannii NOT DETECTED NOT DETECTED   Enterobacteriaceae species NOT DETECTED NOT DETECTED   Enterobacter cloacae complex NOT DETECTED NOT DETECTED   Escherichia coli NOT DETECTED NOT DETECTED   Klebsiella oxytoca NOT DETECTED NOT DETECTED   Klebsiella pneumoniae NOT DETECTED NOT DETECTED   Proteus species NOT DETECTED NOT DETECTED   Serratia marcescens NOT DETECTED NOT DETECTED   Haemophilus influenzae NOT DETECTED NOT DETECTED   Neisseria meningitidis NOT DETECTED NOT DETECTED   Pseudomonas aeruginosa NOT DETECTED NOT DETECTED   Candida albicans NOT DETECTED NOT DETECTED   Candida glabrata NOT DETECTED NOT DETECTED   Candida krusei NOT DETECTED NOT DETECTED   Candida parapsilosis NOT DETECTED  NOT DETECTED   Candida tropicalis NOT DETECTED NOT DETECTED    Lorenza Evangelist 12/08/2017  12:52 AM

## 2017-12-08 NOTE — Progress Notes (Signed)
PROGRESS NOTE    Hunter [name]  ZOX:[id number] DOB: [birth year] DOA: 12/06/2017 PCP: Kallie Locks, FNP   Brief Narrative:  47 year old requiring chronic 2-3 L oxygen at home due to advanced sarcoid, FSGS, obstructive sleep apnea on CPAP, hypertension, non-insulin-dependent diabetes mellitus came to the hospital with complains of progressive worsening of lower extremity swelling, weight gain and fatigue.   Assessment & Plan:   Principal Problem:   Hypertensive urgency Active Problems:   HTN (hypertension)   Chronic diastolic CHF (congestive heart failure) (HCC)   Prediabetes   Sarcoidosis of lung (HCC)   Secondary pulmonary arterial hypertension (HCC)   Right foot pain   Elevated troponin   Tobacco abuse  Acute on chronic right-sided heart failure Bilateral lower extremity swelling - Last echo 9 2018 showed ejection fraction 55 to 60% with dilated right RV.  Elevated PA pressure on right heart cath - Decreased response to Lasix 40 mg IV, will increase it to 80 mg IV 3 times daily.  If necessary we can change it to torsemide/Bumex.  Does not appear extremely volume overloaded? -Cardiology consulted. - CTA- appears to be negative for pulmonary embolism. -Cardiology team is following the patient-we will make necessary adjustments as recommended. -Lower extremity Dopplers-limited but no thrombosis noted  Upper respiratory tract infection with possible underlying acute bronchitis -We will start patient on bronchodilators, Pulmicort twice daily.  Incentive spirometry and flutter valve.  Check respiratory panel - Course of Z-Pak.  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 Delton Coombes.  Continue bronchodilators and supplemental oxygen.  Consult pulmonary if necessary  Obstructive sleep apnea - Continue CPAP as needed.  Essential hypertension -Amlodipine 10 mg daily.  Getting aggressive  diuresis.  FSGS - He is to follow-up with Dr. Lacy Duverney.  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  Currently on 10 mg of prednisone daily  Diabetes mellitus type 2 -Hemoglobin A1c 6.1, Accu-Chek and sliding scale.  DVT prophylaxis: Lovenox Code Status: Full code Family Communication: None at bedside Disposition Plan: Maintain inpatient stay until his cardiopulmonary symptoms have improved and stabilized.  Cardiology is following the patient.  Getting aggressive IV diuresis as well.  Consultants:   Cardiology  Procedures:   None  Antimicrobials:   Azithromycin day 1   Subjective: Reports of exertional shortness of breath.  He also states he has felt upper chest congestion and bringing up greenish mucus.  Review of Systems Otherwise negative except as per HPI, including: General: Denies fever, chills, night sweats or unintended weight loss. Resp: Denieswheezing,  Cardiac: Denies chest pain, palpitations, orthopnea, paroxysmal nocturnal dyspnea. GI: Denies abdominal pain, nausea, vomiting, diarrhea or constipation GU: Denies dysuria, frequency, hesitancy or incontinence MS: Denies muscle aches, joint pain or swelling Neuro: Denies headache, neurologic deficits (focal weakness, numbness, tingling), abnormal gait Psych: Denies anxiety, depression, SI/HI/AVH Skin: Denies new rashes or lesions ID: Denies sick contacts, exotic exposures, travel  Objective: Vitals:   12/07/17 0835 12/07/17 1549 12/07/17 2046 12/08/17 0556  BP: (!) 148/88 (!) 141/83 (!) 148/92 (!) 109/58  Pulse: 72 82 78 (!) 58  Resp: 18 20 20 20   Temp: 98.2 F (36.8 C) 98.9 F (37.2 C) 98.5 F (36.9 C) 97.7 F (36.5 C)  TempSrc: Oral Oral  Oral  SpO2: 93% 95% 99% 100%  Weight:    129.7  kg  Height:        Intake/Output Summary (Last 24  hours) at 12/08/2017 1237 Last data filed at 12/08/2017 0948 Gross per 24 hour  Intake 1320 ml  Output 800 ml  Net 520 ml   Filed Weights   12/06/17 1437 12/08/17 0556  Weight: 126.1 kg 129.7 kg    Examination:  General exam: Appears calm and comfortable  Respiratory system: Slight coarse breath sounds with bibasilar crackles Cardiovascular system: S1 & S2 heard, RRR. No JVD, murmurs, rubs, gallops or clicks. No pedal edema. Gastrointestinal system: Abdomen is nondistended, soft and nontender. No organomegaly or masses felt. Normal bowel sounds heard. Central nervous system: Alert and oriented. No focal neurological deficits. Extremities: Symmetric 5 x 5 power. Skin: No rashes, lesions or ulcers Psychiatry: Judgement and insight appear normal. Mood & affect appropriate.     Data Reviewed:   CBC: Recent Labs  Lab 12/06/17 1555 12/08/17 0544  WBC 7.3 12.1*  NEUTROABS 5.5  --   HGB 14.0 13.6  HCT 47.0 44.0  MCV 97.7 94.4  PLT 145* 152   Basic Metabolic Panel: Recent Labs  Lab 12/06/17 1554 12/06/17 1555 12/08/17 0544  NA 140  --  137  K 4.0  --  4.1  CL 98  --  96*  CO2 36*  --  33*  GLUCOSE 91  --  168*  BUN 16  --  34*  CREATININE 0.91  --  0.94  CALCIUM 8.3*  --  8.4*  MG  --  2.1  --    GFR: Estimated Creatinine Clearance: 132.3 mL/min (by C-G formula based on SCr of 0.94 mg/dL). Liver Function Tests: Recent Labs  Lab 12/06/17 1554  AST 27  ALT 26  ALKPHOS 64  BILITOT 1.3*  PROT 6.8  ALBUMIN 3.5   No results for input(s): LIPASE, AMYLASE in the last 168 hours. No results for input(s): AMMONIA in the last 168 hours. Coagulation Profile: Recent Labs  Lab 12/06/17 1554  INR 0.80   Cardiac Enzymes: Recent Labs  Lab 12/06/17 1555 12/06/17 2038 12/06/17 2146 12/07/17 0206 12/07/17 1005  TROPONINI 0.03* 0.03* 0.04* 0.03* <0.03   BNP (last 3 results) No results for input(s): PROBNP in the last 8760 hours. HbA1C: Recent Labs     12/07/17 0206  HGBA1C 6.1*   CBG: Recent Labs  Lab 12/07/17 1236 12/07/17 1713 12/07/17 2049 12/08/17 0726 12/08/17 1146  GLUCAP 119* 211* 149* 107* 91   Lipid Profile: Recent Labs    12/07/17 0206  CHOL 287*  HDL 73  LDLCALC 195*  TRIG 96  CHOLHDL 3.9   Thyroid Function Tests: No results for input(s): TSH, T4TOTAL, FREET4, T3FREE, THYROIDAB in the last 72 hours. Anemia Panel: No results for input(s): VITAMINB12, FOLATE, FERRITIN, TIBC, IRON, RETICCTPCT in the last 72 hours. Sepsis Labs: No results for input(s): PROCALCITON, LATICACIDVEN in the last 168 hours.  Recent Results (from the past 240 hour(s))  Culture, blood (Routine X 2) w Reflex to ID Panel     Status: Abnormal (Preliminary result)   Collection Time: 12/06/17  9:46 PM  Result Value Ref Range Status   Specimen Description   Final    BLOOD LEFT ANTECUBITAL Performed at [facility], [address] [place], [place] [address]    Special Requests   Final    BOTTLES DRAWN AEROBIC AND ANAEROBIC Blood Culture adequate volume   Culture  Setup Time   Final    ANAEROBIC BOTTLE ONLY GRAM  POSITIVE COCCI CRITICAL RESULT CALLED TO, READ BACK BY AND VERIFIED WITH: B GREEN PHARMD 0050 12/08/17 A BROWNING Performed at [facility], [address] [place], [place] [address]    Culture STAPHYLOCOCCUS EPIDERMIDIS (A)  Final   Report Status PENDING  Incomplete  Blood Culture ID Panel (Reflexed)     Status: Abnormal   Collection Time: 12/06/17  9:46 PM  Result Value Ref Range Status   Enterococcus species NOT DETECTED NOT DETECTED Final   Listeria monocytogenes NOT DETECTED NOT DETECTED Final   Staphylococcus species DETECTED (A) NOT DETECTED Final    Comment: Methicillin (oxacillin) susceptible coagulase negative staphylococcus. Possible blood culture contaminant (unless isolated from more than one blood culture draw or clinical case suggests pathogenicity). No antibiotic treatment is indicated  for blood  culture contaminants. CRITICAL RESULT CALLED TO, READ BACK BY AND VERIFIED WITH: B GREEN PHARMD 0050 12/08/17 A BROWNING    Staphylococcus aureus (BCID) NOT DETECTED NOT DETECTED Final   Methicillin resistance NOT DETECTED NOT DETECTED Final   Streptococcus species NOT DETECTED NOT DETECTED Final   Streptococcus agalactiae NOT DETECTED NOT DETECTED Final   Streptococcus pneumoniae NOT DETECTED NOT DETECTED Final   Streptococcus pyogenes NOT DETECTED NOT DETECTED Final   Acinetobacter baumannii NOT DETECTED NOT DETECTED Final   Enterobacteriaceae species NOT DETECTED NOT DETECTED Final   Enterobacter cloacae complex NOT DETECTED NOT DETECTED Final   Escherichia coli NOT DETECTED NOT DETECTED Final   Klebsiella oxytoca NOT DETECTED NOT DETECTED Final   Klebsiella pneumoniae NOT DETECTED NOT DETECTED Final   Proteus species NOT DETECTED NOT DETECTED Final   Serratia marcescens NOT DETECTED NOT DETECTED Final   Haemophilus influenzae NOT DETECTED NOT DETECTED Final   Neisseria meningitidis NOT DETECTED NOT DETECTED Final   Pseudomonas aeruginosa NOT DETECTED NOT DETECTED Final   Candida albicans NOT DETECTED NOT DETECTED Final   Candida glabrata NOT DETECTED NOT DETECTED Final   Candida krusei NOT DETECTED NOT DETECTED Final   Candida parapsilosis NOT DETECTED NOT DETECTED Final   Candida tropicalis NOT DETECTED NOT DETECTED Final    Comment: Performed at [facility], [address], [place], [place] [address]  MRSA PCR Screening     Status: None   Collection Time: 12/07/17  9:39 AM  Result Value Ref Range Status   MRSA by PCR NEGATIVE NEGATIVE Final    Comment:        The GeneXpert MRSA Assay (FDA approved for NASAL specimens only), is one component of a comprehensive MRSA colonization surveillance program. It is not intended to diagnose MRSA infection nor to guide or monitor treatment for MRSA infections. Performed at [facility], [address], [place], [place] [address]          Radiology Studies: Dg Chest 2 View  Result Date: 12/06/2017 CLINICAL DATA:  Shortness of breath EXAM: CHEST - 2 VIEW COMPARISON:  08/05/2017 FINDINGS: Mild cardiomegaly with bilateral hilar enlargement. There is bilateral interstitial opacities, slightly worsened from the prior study. Chronic right lung volume loss with small pleural effusion. IMPRESSION: 1. Cardiomegaly with increased interstitial opacities, likely mild interstitial pulmonary edema superimposed on chronic interstitial changes related sarcoidosis. 2. Chronic right lung volume loss with small pleural effusion. Electronically Signed   By: Deatra Robinson M.D.   On: 12/06/2017 16:45   Ct Angio Chest Pe W And/or Wo Contrast  Result Date: 12/06/2017 CLINICAL DATA:  Bilateral ankle pain and swelling x2 days. Patient ran out of  CHF medication yesterday. History of sarcoid. EXAM: CT ANGIOGRAPHY CHEST WITH CONTRAST TECHNIQUE: Multidetector CT imaging of the chest was performed using the standard protocol during bolus administration of intravenous contrast. Multiplanar CT image reconstructions and MIPs were obtained to evaluate the vascular anatomy. CONTRAST:  ISOVUE-370 IOPAMIDOL (ISOVUE-370) INJECTION 76% COMPARISON:  11/11/2016 FINDINGS: Cardiovascular: Chronic dilatation of the main pulmonary artery to 4.3 cm consistent with chronic pulmonary hypertension. No large central pulmonary embolus is identified. Opacification of the pulmonary arteries is limited beyond the lobar level due to motion artifacts. Small peripheral pulmonary emboli would be difficult to entirely exclude but is not conclusive. Heart size is enlarged without pericardial effusion. Stable appearance of the thoracic aorta without significant atherosclerotic calcifications. No coronary arterial calcifications. Mediastinum/Nodes: No enlarged mediastinal, hilar, or axillary lymph nodes. Thyroid gland, trachea, and esophagus  demonstrate no significant findings. Lungs/Pleura: Chronic right-sided pleural thickening calcifications similar prior exam. Trace right-sided pleural effusion. Chronic pleuroparenchymal architectural distortion and linear scarring along periphery the right lung and periphery of left upper lobe similar in appearance to prior. Some scarring along the periphery of the left lung is calcified. No suspicious pulmonary nodules or masses. Upper Abdomen: Calcified retroperitoneal lymph nodes similar to prior. Musculoskeletal: No aggressive osseous lesions.  No acute fracture. Review of the MIP images confirms the above findings. IMPRESSION: 1. Cardiomegaly with chronic marked dilatation of the main pulmonary artery compatible pulmonary hypertension. 2. No large central pulmonary embolus is identified. Peripheral pulmonary arteries are limited due to timing of contrast motion artifacts. 3. Subpleural areas of scarring bilaterally as above. 4. Stigmata of chronic sarcoidosis given calcified lymph nodes in the upper abdomen. Electronically Signed   By: Tollie Eth M.D.   On: 12/06/2017 19:55   Dg Foot Complete Right  Result Date: 12/06/2017 CLINICAL DATA:  Generalized right foot pain for 3 days. EXAM: RIGHT FOOT COMPLETE - 3+ VIEW COMPARISON:  March 01, 2017 FINDINGS: There is no evidence of fracture or dislocation. Small plantar calcaneal spur is noted. Soft tissues are unremarkable. IMPRESSION: No acute fracture dislocation. Electronically Signed   By: Sherian Rein M.D.   On: 12/06/2017 16:41   Vas [place] Lower Extremity Venous (dvt)  Result Date: 12/07/2017  Lower Venous Study Indications: Swelling, and Edema.  Limitations: Body habitus and musculoskeletal features. Performing Technologist: Annamaria Helling  Examination Guidelines: A complete evaluation includes B-mode imaging, spectral Doppler, color Doppler, and power Doppler as needed of all accessible portions of each vessel. Bilateral testing is considered an  integral part of a complete examination. Limited examinations for reoccurring indications may be performed as noted.  Right Venous Findings: +---------+---------------+---------+-----------+----------+-------+          CompressibilityPhasicitySpontaneityPropertiesSummary +---------+---------------+---------+-----------+----------+-------+ CFV      Full           Yes      Yes                          +---------+---------------+---------+-----------+----------+-------+ SFJ      Full                                                 +---------+---------------+---------+-----------+----------+-------+ FV Prox  Full                                                 +---------+---------------+---------+-----------+----------+-------+  FV Mid   Full                                                 +---------+---------------+---------+-----------+----------+-------+ FV DistalFull                                                 +---------+---------------+---------+-----------+----------+-------+ PFV      Full                                                 +---------+---------------+---------+-----------+----------+-------+ POP      Full           Yes      Yes                          +---------+---------------+---------+-----------+----------+-------+ PTV      Full                                                 +---------+---------------+---------+-----------+----------+-------+ PERO     Full                                                 +---------+---------------+---------+-----------+----------+-------+  Left Venous Findings: +---------+---------------+---------+-----------+----------+-------+          CompressibilityPhasicitySpontaneityPropertiesSummary +---------+---------------+---------+-----------+----------+-------+ CFV      Full           Yes      Yes                           +---------+---------------+---------+-----------+----------+-------+ SFJ      Full                                                 +---------+---------------+---------+-----------+----------+-------+ FV Prox  Full                                                 +---------+---------------+---------+-----------+----------+-------+ FV Mid   Full                                                 +---------+---------------+---------+-----------+----------+-------+ FV DistalFull                                                 +---------+---------------+---------+-----------+----------+-------+  PFV      Full                                                 +---------+---------------+---------+-----------+----------+-------+ POP      Full           Yes      Yes                          +---------+---------------+---------+-----------+----------+-------+ PTV      Full                                                 +---------+---------------+---------+-----------+----------+-------+ PERO     Full                                                 +---------+---------------+---------+-----------+----------+-------+    Summary: Right: There is no evidence of deep vein thrombosis in the lower extremity. However, portions of this examination were limited- see technologist comments above. No cystic structure found in the popliteal fossa. Left: There is no evidence of deep vein thrombosis in the lower extremity. However, portions of this examination were limited- see technologist comments above. No cystic structure found in the popliteal fossa.  *See table(s) above for measurements and observations. Electronically signed by Gretta Began MD on 12/07/2017 at 3:32:27 PM.    Final         Scheduled Meds: . amLODipine  10 mg Oral Daily  . aspirin EC  325 mg Oral Daily  . enoxaparin (LOVENOX) injection  60 mg Subcutaneous Q24H  . furosemide  80 mg Intravenous Q8H  . insulin  aspart  0-5 Units Subcutaneous QHS  . insulin aspart  0-9 Units Subcutaneous TID WC  . magnesium oxide  400 mg Oral QPM  . nicotine  21 mg Transdermal Daily  . potassium chloride  20 mEq Oral Daily  . predniSONE  10 mg Oral Q breakfast   Continuous Infusions:   LOS: 1 day   Time spent= 35 mins    Ankit Joline Maxcy, MD [facility] Pager [phone]   If 7PM-7AM, please contact night-coverage [email or web address] Password [facility] 12/08/2017, 12:37 PM

## 2017-12-08 NOTE — Consult Note (Addendum)
Cardiology Consultation:   Patient ID: Hunter [name] MRN: [id number]; DOB: [birth year]  Admit date: 12/06/2017 Date of Consult: 12/08/2017  Primary Care Provider: Kallie Locks, FNP Primary Cardiologist: Marca Ancona, MD  Primary Electrophysiologist:  None    Patient Profile:   Hunter [name] is a 47 y.o. male with a hx of advanced pulmonary sarcoidosis, chronic hypoxic respiratory failure (OSA/OHS), and focal segmental glomerulosclerosis with nephrotic syndrome.  Who is being seen today for the evaluation of CHF/right heart failure at the request of Dr Nelson Chimes.  History of Present Illness:   Hunter [name] is followed in the advanced heart failure clinic by Dr. Shirlee Latch.  He was last seen in the office on 09/15/2017.  A review of that note indicates that Last chest CT was in 3/18 and was suggestive of stage IV pulmonary sarcoidosis. PFTs in 1/18 showed severe restriction and severe obstruction. Echo (9/18) showed normal LV EF with D-shaped interventricular septum and RV failure.   Admitted 11/05/16-11/17/16 with acute right heart failure/volume overload. Diuresed 50 pounds with IV Lasix. RHC (10/18) mean RA 6, PA 49/20 mean 30, mean PCWP 10, CI 2.32, PVR 3.6 WU  Weight on admission 126.1 kg Weight 09/15/2017 at last office visit was 122.6 kg and patient was felt to be stable volume Weight 11/01/2017 at pulmonary office visit was 130.6 kg   Upon presentation blood pressure was significantly elevated on presentation 173/121. BP is better this morning at 109/58.  With addition of amlodipine.  Upon my assessment, Hunter [name] is sitting up on the side of the bed, not wearing oxygen as he had just taken off his CPAP after awakening.  His breathing is normal, lungs are clear, no JVD, no significant lower extremity edema.  The patient states that he actually presented to the hospital for evaluation of his right foot pain.  He says that his foot has been so painful that he could not walk on it and he  noted bruising on the side of the foot but denied any type of injury.   He then talked about recently being placed back on steroids by neurology to help with his lower extremity weakness which he says he really did not want to do.  He says that the last time he was put on steroids he was hospitalized with massive volume overload.  He was concerned that he was holding onto too much fluid and this could have somehow been causing his foot pain.  He said that he was feeling heavy and his weight which was usually steady at about 280 pounds was up to 287 pounds.  He had mild increase in shortness of breath and mild increase in pedal edema but no orthopnea or PND.  He states compliance of his torsemide 40 mg daily at home and he had taken an extra 20 mg about 3 times in the last week.  He also has some nasal congestion and a cough with reported dark green/brown sputum for about the last 4 days.  He continues to smoke about 3 cigarettes/day.  He drinks an occasional beer, not every day.  He states that his lower extremity edema is back to normal and breathing is at his baseline. His right foot pain has improved, but still present and now his left foot is tender. He feels that he is ready for discharge.   Past Medical History:  Diagnosis Date  . Arthritis    feet  . CHF (congestive heart failure) (HCC)   . Dyspnea   .  Hypertension   . Nephrotic syndrome   . Pre-diabetes   . Sarcoid   . Sarcoidosis   . Sleep apnea     Past Surgical History:  Procedure Laterality Date  . MUSCLE BIOPSY Right 05/03/2017   Procedure: RIGHT THIGH MUSCLE BIOPSY/RIGHT DELTOID MUSCLE BIOPSY;  Surgeon: Manus Rudd, MD;  Location: [facility] OR;  Service: General;  Laterality: Right;  . RIGHT HEART CATH N/A 11/11/2016   Procedure: RIGHT HEART CATH;  Surgeon: Laurey Morale, MD;  Location: [facility] INVASIVE CV LAB;  Service: Cardiovascular;  Laterality: N/A;     Home Medications:  Prior to Admission medications   Medication Sig  Start Date End Date Taking? Authorizing Provider  albuterol (PROVENTIL HFA;VENTOLIN HFA) 108 (90 Base) MCG/ACT inhaler Inhale 2 puffs into the lungs every 6 (six) hours as needed for wheezing or shortness of breath. 11/08/17  Yes Kallie Locks, FNP  ciclopirox (PENLAC) 8 % solution APPLY TOPICALLY AT BEDTIME OVER NAIL AND SURROUNDING SKIN. APPLY DAILY OVER PREVIOUS COAT. AFTER 7 DAYS, MAY REMOVE WITH ALCOHOL AND CONTINU Patient taking differently: Apply 1 application topically 3 (three) times daily.  04/07/17  Yes Vivi Barrack, DPM  diclofenac sodium (VOLTAREN) 1 % GEL APPLY 2 GRAMS TO FOOT (AND AFFECTED AREAS) 2-4 TIMES DAILY. 04/07/17  Yes Vivi Barrack, DPM  HYDROcodone-acetaminophen (NORCO/VICODIN) 5-325 MG tablet Take 1 tablet by mouth every 6 (six) hours as needed for moderate pain.   Yes [provider]  magnesium oxide (MAG-OX) 400 (241.3 Mg) MG tablet Take 1 tablet (400 mg total) by mouth every evening. 11/08/17  Yes Kallie Locks, FNP  metFORMIN (GLUCOPHAGE) 500 MG tablet Take 1 tablet (500 mg total) by mouth 2 (two) times daily with a meal. 11/08/17  Yes Kallie Locks, FNP  predniSONE (DELTASONE) 20 MG tablet TAKE 2 TABLETS (4OMG) BY MOUTH DAILY WITH FOOD 10/11/17  Yes Leslye Peer, MD  torsemide (DEMADEX) 20 MG tablet Take 40 mg by mouth daily.   Yes [provider]  traMADol (ULTRAM) 50 MG tablet Take 1 tablet (50 mg total) by mouth 2 (two) times daily as needed. Patient taking differently: Take 50 mg by mouth 2 (two) times daily as needed for severe pain.  07/25/17  Yes Kallie Locks, FNP  torsemide (DEMADEX) 10 MG tablet Take 2 tablets (20 mg total) by mouth 2 (two) times daily. 11/08/17   Kallie Locks, FNP    Inpatient Medications: Scheduled Meds: . amLODipine  10 mg Oral Daily  . aspirin EC  325 mg Oral Daily  . enoxaparin (LOVENOX) injection  60 mg Subcutaneous Q24H  . furosemide  40 mg Intravenous BID  . insulin aspart  0-5 Units  Subcutaneous QHS  . insulin aspart  0-9 Units Subcutaneous TID WC  . magnesium oxide  400 mg Oral QPM  . nicotine  21 mg Transdermal Daily  . potassium chloride  20 mEq Oral Daily  . predniSONE  10 mg Oral Q breakfast   Continuous Infusions:  PRN Meds: acetaminophen, albuterol, ALPRAZolam, dextromethorphan-guaiFENesin, hydrALAZINE, morphine injection, nitroGLYCERIN, ondansetron (ZOFRAN) IV, oxyCODONE-acetaminophen, zolpidem  Allergies:   No Known Allergies  Social History:   Social History   Socioeconomic History  . Marital status: Single    Spouse name: Not on file  . Number of children: 0  . Years of education: 6  . Highest education level: Not on file  Occupational History  . Occupation: Not employed  Social Needs  . Financial resource strain:  Not on file  . Food insecurity:    Worry: Not on file    Inability: Not on file  . Transportation needs:    Medical: Not on file    Non-medical: Not on file  Tobacco Use  . Smoking status: Current Some Day Smoker    Packs/day: 0.10    Years: 10.00    Pack years: 1.00    Types: Cigarettes  . Smokeless tobacco: Never Used  . Tobacco comment: 2 cigarettes/day 08/05/17  Substance and Sexual Activity  . Alcohol use: Yes    Alcohol/week: 3.0 standard drinks    Types: 3 Cans of beer per week  . Drug use: No  . Sexual activity: Not on file  Lifestyle  . Physical activity:    Days per week: Not on file    Minutes per session: Not on file  . Stress: Not on file  Relationships  . Social connections:    Talks on phone: Not on file    Gets together: Not on file    Attends religious service: Not on file    Active member of club or organization: Not on file    Attends meetings of clubs or organizations: Not on file    Relationship status: Not on file  . Intimate partner violence:    Fear of current or ex partner: Not on file    Emotionally abused: Not on file    Physically abused: Not on file    Forced sexual activity: Not on  file  Other Topics Concern  . Not on file  Social History Narrative   Lives alone.  Currently homeless and staying on the 3rd floor of a hotel with an elevator.     No children.  Education: high school.      Family History:    Family History  Problem Relation Age of Onset  . Asthma Sister   . Hypertension Paternal Grandmother   . Colon cancer Neg Hx        Patient doesn't know for sure if his family history has colon, pancreatic, rectal cancer     ROS:  Please see the history of present illness.   All other ROS reviewed and negative.     Physical Exam/Data:   Vitals:   12/07/17 0835 12/07/17 1549 12/07/17 2046 12/08/17 0556  BP: (!) 148/88 (!) 141/83 (!) 148/92 (!) 109/58  Pulse: 72 82 78 (!) 58  Resp: 18 20 20 20   Temp: 98.2 F (36.8 C) 98.9 F (37.2 C) 98.5 F (36.9 C) 97.7 F (36.5 C)  TempSrc: Oral Oral  Oral  SpO2: 93% 95% 99% 100%  Weight:    129.7 kg  Height:        Intake/Output Summary (Last 24 hours) at 12/08/2017 0746 Last data filed at 12/08/2017 0600 Gross per 24 hour  Intake 960 ml  Output 800 ml  Net 160 ml   Filed Weights   12/06/17 1437 12/08/17 0556  Weight: 126.1 kg 129.7 kg   Body mass index is 38.79 kg/m.  General:  Well nourished, well developed, in no acute distress HEENT: normal Lymph: no adenopathy Neck: no JVD Endocrine:  No thryomegaly Vascular: No carotid bruits; FA pulses 2+ bilaterally without bruits  Cardiac:  normal S1, S2; RRR; no murmur  Lungs:  clear to auscultation bilaterally, no wheezing, rhonchi or rales  Abd: soft, nontender, no hepatomegaly  Ext: no edema Musculoskeletal:  No deformities, Right foot pain- no deformity Skin: warm and dry  Neuro:  CNs 2-12 intact, no focal abnormalities noted Psych:  Normal affect   EKG:  The EKG was personally reviewed and demonstrates:  Sinus rhythm, first-degree AV block, nonspecific IVCD, PVC, abnormal R wave progression Telemetry:  Telemetry was personally reviewed and  demonstrates: Sinus rhythm in the 60s-70s  Relevant CV Studies:  RIGHT HEART CATH 11/11/2016  Conclusion   1. Optimized filling pressures.   2. 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  With PVR not particularly high (3.6 WU), think that he probably would get much benefit from pulmonary vasodilator.   Will arrange for high resolution chest CT to assess for severity of pulmonary sarcoidosis.    Right Heart Pressures RHC Procedural Findings: Hemodynamics (mmHg) RA mean 6 RV 46/9 PA 49/20, mean 30 PCWP mean 10  Oxygen saturations: PA 66% AO 93%  Cardiac Output (Fick) 5.57  Cardiac Index (Fick) 2.32 PVR 3.6 WU   Echocardiogram 11/06/2016 Study Conclusions - Left ventricle: The cavity size was normal. Systolic function was   normal. The estimated ejection fraction was in the range of 55%   to 60%. Wall motion was normal; there were no regional wall   motion abnormalities. - Ventricular septum: The contour showed systolic flattening. These   changes are consistent with RV pressure overload. - Aortic root: The aortic root was mildly dilated. - Right ventricle: The cavity size was dilated. Systolic function   was moderately reduced. - Right atrium: The atrium was severely dilated. - Atrial septum: The septum bowed from right to left, consistent   with increased right atrial pressure. - Pulmonary arteries: Systolic pressure could not be accurately   estimated todue to lack of an adequate TR jet. the dilated RV,   septtal flattening and bowing fo the atrial septum to the right   suggests signififcant elevation of RVSP and RAP. PA peak   pressure: 32 mm Hg (S).    Laboratory Data:  Chemistry Recent Labs  Lab 12/06/17 1554 12/08/17 0544  NA 140 137  K 4.0 4.1  CL 98 96*  CO2 36* 33*  GLUCOSE 91 168*  BUN 16 34*  CREATININE 0.91 0.94  CALCIUM 8.3* 8.4*  GFRNONAA >60 >60  GFRAA >60 >60  ANIONGAP 6 8    Recent Labs  Lab 12/06/17 1554    PROT 6.8  ALBUMIN 3.5  AST 27  ALT 26  ALKPHOS 64  BILITOT 1.3*   Hematology Recent Labs  Lab 12/06/17 1555 12/08/17 0544  WBC 7.3 12.1*  RBC 4.81 4.66  HGB 14.0 13.6  HCT 47.0 44.0  MCV 97.7 94.4  MCH 29.1 29.2  MCHC 29.8* 30.9  RDW 13.4 13.2  PLT 145* 152   Cardiac Enzymes Recent Labs  Lab 12/06/17 1555 12/06/17 2038 12/06/17 2146 12/07/17 0206 12/07/17 1005  TROPONINI 0.03* 0.03* 0.04* 0.03* <0.03   No results for input(s): TROPIPOC in the last 168 hours.  BNP Recent Labs  Lab 12/06/17 1555  BNP 72.6    DDimer No results for input(s): DDIMER in the last 168 hours.  Radiology/Studies:  Dg Chest 2 View  Result Date: 12/06/2017 CLINICAL DATA:  Shortness of breath EXAM: CHEST - 2 VIEW COMPARISON:  08/05/2017 FINDINGS: Mild cardiomegaly with bilateral hilar enlargement. There is bilateral interstitial opacities, slightly worsened from the prior study. Chronic right lung volume loss with small pleural effusion. IMPRESSION: 1. Cardiomegaly with increased interstitial opacities, likely mild interstitial pulmonary edema superimposed on chronic interstitial changes related sarcoidosis. 2. Chronic right lung volume loss with small  pleural effusion. Electronically Signed   By: Deatra Robinson M.D.   On: 12/06/2017 16:45   Ct Angio Chest Pe W And/or Wo Contrast  Result Date: 12/06/2017 CLINICAL DATA:  Bilateral ankle pain and swelling x2 days. Patient ran out of CHF medication yesterday. History of sarcoid. EXAM: CT ANGIOGRAPHY CHEST WITH CONTRAST TECHNIQUE: Multidetector CT imaging of the chest was performed using the standard protocol during bolus administration of intravenous contrast. Multiplanar CT image reconstructions and MIPs were obtained to evaluate the vascular anatomy. CONTRAST:  ISOVUE-370 IOPAMIDOL (ISOVUE-370) INJECTION 76% COMPARISON:  11/11/2016 FINDINGS: Cardiovascular: Chronic dilatation of the main pulmonary artery to 4.3 cm consistent with chronic  pulmonary hypertension. No large central pulmonary embolus is identified. Opacification of the pulmonary arteries is limited beyond the lobar level due to motion artifacts. Small peripheral pulmonary emboli would be difficult to entirely exclude but is not conclusive. Heart size is enlarged without pericardial effusion. Stable appearance of the thoracic aorta without significant atherosclerotic calcifications. No coronary arterial calcifications. Mediastinum/Nodes: No enlarged mediastinal, hilar, or axillary lymph nodes. Thyroid gland, trachea, and esophagus demonstrate no significant findings. Lungs/Pleura: Chronic right-sided pleural thickening calcifications similar prior exam. Trace right-sided pleural effusion. Chronic pleuroparenchymal architectural distortion and linear scarring along periphery the right lung and periphery of left upper lobe similar in appearance to prior. Some scarring along the periphery of the left lung is calcified. No suspicious pulmonary nodules or masses. Upper Abdomen: Calcified retroperitoneal lymph nodes similar to prior. Musculoskeletal: No aggressive osseous lesions.  No acute fracture. Review of the MIP images confirms the above findings. IMPRESSION: 1. Cardiomegaly with chronic marked dilatation of the main pulmonary artery compatible pulmonary hypertension. 2. No large central pulmonary embolus is identified. Peripheral pulmonary arteries are limited due to timing of contrast motion artifacts. 3. Subpleural areas of scarring bilaterally as above. 4. Stigmata of chronic sarcoidosis given calcified lymph nodes in the upper abdomen. Electronically Signed   By: Tollie Eth M.D.   On: 12/06/2017 19:55   Dg Foot Complete Right  Result Date: 12/06/2017 CLINICAL DATA:  Generalized right foot pain for 3 days. EXAM: RIGHT FOOT COMPLETE - 3+ VIEW COMPARISON:  March 01, 2017 FINDINGS: There is no evidence of fracture or dislocation. Small plantar calcaneal spur is noted. Soft  tissues are unremarkable. IMPRESSION: No acute fracture dislocation. Electronically Signed   By: Sherian Rein M.D.   On: 12/06/2017 16:41   Vas [place] Lower Extremity Venous (dvt)  Result Date: 12/07/2017  Lower Venous Study Indications: Swelling, and Edema.  Limitations: Body habitus and musculoskeletal features. Performing Technologist: Annamaria Helling  Examination Guidelines: A complete evaluation includes B-mode imaging, spectral Doppler, color Doppler, and power Doppler as needed of all accessible portions of each vessel. Bilateral testing is considered an integral part of a complete examination. Limited examinations for reoccurring indications may be performed as noted.  Right Venous Findings: +---------+---------------+---------+-----------+----------+-------+          CompressibilityPhasicitySpontaneityPropertiesSummary +---------+---------------+---------+-----------+----------+-------+ CFV      Full           Yes      Yes                          +---------+---------------+---------+-----------+----------+-------+ SFJ      Full                                                 +---------+---------------+---------+-----------+----------+-------+  FV Prox  Full                                                 +---------+---------------+---------+-----------+----------+-------+ FV Mid   Full                                                 +---------+---------------+---------+-----------+----------+-------+ FV DistalFull                                                 +---------+---------------+---------+-----------+----------+-------+ PFV      Full                                                 +---------+---------------+---------+-----------+----------+-------+ POP      Full           Yes      Yes                          +---------+---------------+---------+-----------+----------+-------+ PTV      Full                                                  +---------+---------------+---------+-----------+----------+-------+ PERO     Full                                                 +---------+---------------+---------+-----------+----------+-------+  Left Venous Findings: +---------+---------------+---------+-----------+----------+-------+          CompressibilityPhasicitySpontaneityPropertiesSummary +---------+---------------+---------+-----------+----------+-------+ CFV      Full           Yes      Yes                          +---------+---------------+---------+-----------+----------+-------+ SFJ      Full                                                 +---------+---------------+---------+-----------+----------+-------+ FV Prox  Full                                                 +---------+---------------+---------+-----------+----------+-------+ FV Mid   Full                                                 +---------+---------------+---------+-----------+----------+-------+  FV DistalFull                                                 +---------+---------------+---------+-----------+----------+-------+ PFV      Full                                                 +---------+---------------+---------+-----------+----------+-------+ POP      Full           Yes      Yes                          +---------+---------------+---------+-----------+----------+-------+ PTV      Full                                                 +---------+---------------+---------+-----------+----------+-------+ PERO     Full                                                 +---------+---------------+---------+-----------+----------+-------+    Summary: Right: There is no evidence of deep vein thrombosis in the lower extremity. However, portions of this examination were limited- see technologist comments above. No cystic structure found in the popliteal fossa. Left: There is no evidence of deep vein  thrombosis in the lower extremity. However, portions of this examination were limited- see technologist comments above. No cystic structure found in the popliteal fossa.  *See table(s) above for measurements and observations. Electronically signed by Gretta Began MD on 12/07/2017 at 3:32:27 PM.    Final     Assessment and Plan:   Acute on chronic CHF/right ventricular failure -Patient presented with foot pain and also mild increase in shortness of breath and lower extremity edema.  He had no orthopnea.  Home weight was up about 7 pounds since recently being started on corticosteroids for lower extremity weakness by neurology. -Echo (10/2016) with EF 55-60%, septal flattening, dilated RV with moderately decreased systolic function.  PA pressure estimation likely inaccurate -RHC (11/2016): Mean RA 6, PA 49/20 mean 30, mean PCWP 10, CL 2.32, PVR 3.6 WU -Home management includes torsemide 40 mg daily with an extra 20 mg as needed which he took about 3 times in the last week. -Admission weight 126.1 kg, today 129.7 kg, ?  Accuracy as the patient looks euvolemic and he reports decreasing lower extremity edema -BNP 72.6 which is considerably less than his usual.  At his last hospitalization and 10/28/2016 BNP was greater than 1000. -Chest x-ray showed cardiomegaly with increased interstitial opacities, likely mild interstitial pulmonary edema superimposed on chronic interstitial changes related to sarcoidosis.  Chronic right lung volume loss with small pleural effusion. -Chest CTA showed marked dilation of the main pulmonary artery compatible with pulmonary hypertension, no large central pulmonary embolus, but peripheral pulmonary arteries are limited due to timing of contrast motion artifacts. -He is being diuresed with Lasix 40 mg IV twice daily, has received 3 doses so  far. -Urine output was initially good with 1.7 L, but dropped off to 800 mL yesterday.  Weight is actually up 3 kg today. -Potassium 4.1  serum creatinine 0.94.  BUN is up at 34 from 16.  His baseline BUN appears to be around 30-40 -I think the patient does not appear to be significantly volume overloaded and is adequately diuresed.  -Patient advised on low-sodium diet and compliance with his oxygen therapy to reduce hypoxic pulmonary vasoconstriction. -Will discuss with Dr. Duke Salvia for more input.   Sarcoidosis of the lungs -Diagnosed by skin biopsy.  Followed by Dr. Delton Coombes -Mixed restriction and obstruction -CT chest (2016, 11/2016): Consistent with stage IV pulmonary sarcoidosis with severely dilated pulmonic trunk and main pulmonary arteries -On albuterol as needed and supplemental oxygen  OHS/OSA -On 3 L home oxygen, BiPAP at night with bleed-in  Hypertension -The patient is not on blood pressure medication at home except for diuretic. -Blood pressure was significantly elevated on presentation 173/121. BP is better this morning at 109/58. -Amlodipine 10 mg has been added  Tobacco abuse -Continues to smoke about 3 cigarettes per day. Advised on complete cessation especially in setting of his other lung issues.   Focal segmental glomerulosclerosis with nephrotic syndrome -Allowed by Dr. Kathrene Bongo.  Has been treated with steroids and Prograf in the past.  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 -Recently restarted on corticosteroids by neurology for his lower extremity weakness.  The patient blames the steroids for problems with his fluid status and weight gain.  Type 2 diabetes -On metformin at home.  A1c is 6.1 which is decades adequate control.    For questions or updates, please contact [facility] Please consult [email or web address] for contact info under     Signed, Berton Bon, NP  12/08/2017 7:46 AM

## 2017-12-09 DIAGNOSIS — I50810 Right heart failure, unspecified: Secondary | ICD-10-CM

## 2017-12-09 LAB — CBC
HEMATOCRIT: 47.3 % (ref 39.0–52.0)
Hemoglobin: 14.3 g/dL (ref 13.0–17.0)
MCH: 29 pg (ref 26.0–34.0)
MCHC: 30.2 g/dL (ref 30.0–36.0)
MCV: 95.9 fL (ref 80.0–100.0)
Platelets: 161 10*3/uL (ref 150–400)
RBC: 4.93 MIL/uL (ref 4.22–5.81)
RDW: 13.2 % (ref 11.5–15.5)
WBC: 6.7 10*3/uL (ref 4.0–10.5)
nRBC: 0 % (ref 0.0–0.2)

## 2017-12-09 LAB — GLUCOSE, CAPILLARY
Glucose-Capillary: 95 mg/dL (ref 70–99)
Glucose-Capillary: 141 mg/dL — ABNORMAL HIGH (ref 70–99)
Glucose-Capillary: 147 mg/dL — ABNORMAL HIGH (ref 70–99)
Glucose-Capillary: 130 mg/dL — ABNORMAL HIGH (ref 70–99)

## 2017-12-09 LAB — CULTURE, BLOOD (ROUTINE X 2): Special Requests: ADEQUATE

## 2017-12-09 LAB — BASIC METABOLIC PANEL
Anion gap: 9 (ref 5–15)
BUN: 44 mg/dL — AB (ref 6–20)
CHLORIDE: 97 mmol/L — AB (ref 98–111)
CO2: 32 mmol/L (ref 22–32)
CREATININE: 1.14 mg/dL (ref 0.61–1.24)
Calcium: 8.3 mg/dL — ABNORMAL LOW (ref 8.9–10.3)
GFR calc Af Amer: >60 mL/min (ref >60)
Glucose, Bld: 99 mg/dL (ref 70–99)
Potassium: 4.1 mmol/L (ref 3.5–5.1)
SODIUM: 138 mmol/L (ref 135–145)

## 2017-12-09 LAB — MAGNESIUM: Magnesium: 2.4 mg/dL (ref 1.7–2.4)

## 2017-12-09 MED ORDER — FLUTICASONE PROPIONATE 50 MCG/ACT NA SUSP
2.0000 | Freq: Two times a day (BID) | NASAL | Status: DC | PRN
  Filled 2017-12-09: qty 16

## 2017-12-09 MED ORDER — FUROSEMIDE 10 MG/ML IJ SOLN
12.0000 mg/h | INTRAVENOUS | Status: DC
  Administered 2017-12-09: 12 mg/h via INTRAVENOUS
  Filled 2017-12-09: qty 20
  Filled 2017-12-09: qty 25

## 2017-12-09 MED ORDER — FUROSEMIDE 10 MG/ML IJ SOLN
80.0000 mg | Freq: Once | INTRAMUSCULAR | Status: AC
  Administered 2017-12-09: 80 mg via INTRAVENOUS
  Filled 2017-12-09: qty 8

## 2017-12-09 MED ORDER — LISINOPRIL 5 MG PO TABS
5.0000 mg | ORAL_TABLET | Freq: Every day | ORAL | Status: DC
  Administered 2017-12-09 – 2017-12-10 (×2): 5 mg via ORAL
  Filled 2017-12-09 (×2): qty 1

## 2017-12-09 NOTE — Progress Notes (Signed)
[facility] PROGRESS NOTE    Progress Note  Hunter [name]  ZOX:[id number] DOB: 1970/05/11 DOA: 12/06/2017 PCP: Kallie Locks, FNP     Brief Narrative:   Hunter [name] is an 47 y.o. 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  Assessment/Plan:   Acute on chronic diastolic CHF/RVF (right ventricular failure) (HCC): Last 2D echo on 10/2016 showed an EF of 55% with dilated right ventricle and elevated right-sided heart pressure on cardiac cath. According to record his lowest weight on 08/05/2017 was 120 kg CTA negative for PE. Lower ext [place]: negative for DVT. Poor response, will place him on a Lasix drip check a basic metabolic panel in the morning. His weight has decreased from 129.7 kg 129.1 kg. We will we will continue diuretics.   He is currently on low-dose prednisone. I am not sure why he is not on ACE inhibitor and/or hydralazine and indoor  Upper respiratory tract infection with possible underlying acute bronchitis: Currently on prednisone bronchodilators and antibiotics. On low-dose prednisone.  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 Delton Coombes is an outpatient.  History of sleep apnea/obesity hypoventilation syndrome: Continue oxygen and CPAP at night.  Essential hypertension: Amlodipine 10 mg, low-dose ACE inhibitor for better blood pressure control.  I think he would benefit more from hydralazine and indoor tanning Norvasc  FSGS: Follow-up with Dr. Layla Barter as an outpatient.  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  Diabeties mellitus type II: With an A1c of 6.1 currently on sliding scale. Good control in house.  Hypertensive urgency Blood pressure is improving, I guess it will continue to improve  as we diurese him    Obesity hypoventilation syndrome (HCC)        DVT prophylaxis: lovenxo Family Communication:none Disposition Plan/Barrier to D/C: home in 1-2 days Code Status:     Code Status Orders  (From admission, onward)         Start     Ordered   12/06/17 2106  Full code  Continuous     12/06/17 2107        Code Status History    Date Active Date Inactive Code Status Order ID Comments User Context   11/05/2016 2001 11/17/2016 2055 Full Code [id number]  Jordan Hawks, DO ED        IV Access:    Peripheral IV   Procedures and diagnostic studies:   Vas [place] Lower Extremity Venous (dvt)  Result Date: 12/07/2017  Lower Venous Study Indications: Swelling, and Edema.  Limitations: Body habitus and musculoskeletal features. Performing Technologist: Annamaria Helling  Examination Guidelines: A complete evaluation includes B-mode imaging, spectral Doppler, color Doppler, and power Doppler as needed of all accessible portions of each vessel. Bilateral testing is considered an integral part of a complete examination. Limited examinations for reoccurring indications may be performed as noted.  Right Venous Findings: +---------+---------------+---------+-----------+----------+-------+          CompressibilityPhasicitySpontaneityPropertiesSummary +---------+---------------+---------+-----------+----------+-------+ CFV      Full           Yes      Yes                          +---------+---------------+---------+-----------+----------+-------+ SFJ      Full                                                 +---------+---------------+---------+-----------+----------+-------+  FV Prox  Full                                                 +---------+---------------+---------+-----------+----------+-------+ FV Mid   Full                                                 +---------+---------------+---------+-----------+----------+-------+ FV  DistalFull                                                 +---------+---------------+---------+-----------+----------+-------+ PFV      Full                                                 +---------+---------------+---------+-----------+----------+-------+ POP      Full           Yes      Yes                          +---------+---------------+---------+-----------+----------+-------+ PTV      Full                                                 +---------+---------------+---------+-----------+----------+-------+ PERO     Full                                                 +---------+---------------+---------+-----------+----------+-------+  Left Venous Findings: +---------+---------------+---------+-----------+----------+-------+          CompressibilityPhasicitySpontaneityPropertiesSummary +---------+---------------+---------+-----------+----------+-------+ CFV      Full           Yes      Yes                          +---------+---------------+---------+-----------+----------+-------+ SFJ      Full                                                 +---------+---------------+---------+-----------+----------+-------+ FV Prox  Full                                                 +---------+---------------+---------+-----------+----------+-------+ FV Mid   Full                                                 +---------+---------------+---------+-----------+----------+-------+  FV DistalFull                                                 +---------+---------------+---------+-----------+----------+-------+ PFV      Full                                                 +---------+---------------+---------+-----------+----------+-------+ POP      Full           Yes      Yes                          +---------+---------------+---------+-----------+----------+-------+ PTV      Full                                                  +---------+---------------+---------+-----------+----------+-------+ PERO     Full                                                 +---------+---------------+---------+-----------+----------+-------+    Summary: Right: There is no evidence of deep vein thrombosis in the lower extremity. However, portions of this examination were limited- see technologist comments above. No cystic structure found in the popliteal fossa. Left: There is no evidence of deep vein thrombosis in the lower extremity. However, portions of this examination were limited- see technologist comments above. No cystic structure found in the popliteal fossa.  *See table(s) above for measurements and observations. Electronically signed by Gretta Began MD on 12/07/2017 at 3:32:27 PM.    Final      Medical Consultants:    None.  Anti-Infectives:   None  Subjective:    Nani Gasser he relates his breathing is improved still feels weak.  Objective:    Vitals:   12/08/17 1402 12/08/17 1941 12/08/17 2034 12/09/17 0450  BP:   (!) 150/79 (!) 146/94  Pulse:   92 (!) 58  Resp:  18 17 20   Temp:   98.2 F (36.8 C) 97.7 F (36.5 C)  TempSrc:   Oral   SpO2: 98% 98% 96% 100%  Weight:    129.1 kg  Height:        Intake/Output Summary (Last 24 hours) at 12/09/2017 0706 Last data filed at 12/09/2017 0600 Gross per 24 hour  Intake 1820 ml  Output 1725 ml  Net 95 ml   Filed Weights   12/06/17 1437 12/08/17 0556 12/09/17 0450  Weight: 126.1 kg 129.7 kg 129.1 kg    Exam: General exam: In no acute distress. Respiratory system: Good air movement and clear to auscultation. Cardiovascular system: S1 & S2 heard, RRR.  Gastrointestinal system: Abdomen is nondistended, soft and nontender.  Central nervous system: Alert and oriented. No focal neurological deficits. Extremities: trace edema Skin: No rashes, lesions or ulcers Psychiatry: Judgement and insight appear normal. Mood & affect appropriate.    Data Reviewed:      Labs: Basic Metabolic Panel: Recent Labs  Lab 12/06/17 1554 12/06/17 1555 12/08/17 0544 12/09/17 0551  NA 140  --  137 138  K 4.0  --  4.1 4.1  CL 98  --  96* 97*  CO2 36*  --  33* 32  GLUCOSE 91  --  168* 99  BUN 16  --  34* 44*  CREATININE 0.91  --  0.94 1.14  CALCIUM 8.3*  --  8.4* 8.3*  MG  --  2.1  --  2.4   GFR Estimated Creatinine Clearance: 108.9 mL/min (by C-G formula based on SCr of 1.14 mg/dL). Liver Function Tests: Recent Labs  Lab 12/06/17 1554  AST 27  ALT 26  ALKPHOS 64  BILITOT 1.3*  PROT 6.8  ALBUMIN 3.5   No results for input(s): LIPASE, AMYLASE in the last 168 hours. No results for input(s): AMMONIA in the last 168 hours. Coagulation profile Recent Labs  Lab 12/06/17 1554  INR 0.80    CBC: Recent Labs  Lab 12/06/17 1555 12/08/17 0544 12/09/17 0551  WBC 7.3 12.1* 6.7  NEUTROABS 5.5  --   --   HGB 14.0 13.6 14.3  HCT 47.0 44.0 47.3  MCV 97.7 94.4 95.9  PLT 145* 152 161   Cardiac Enzymes: Recent Labs  Lab 12/06/17 1555 12/06/17 2038 12/06/17 2146 12/07/17 0206 12/07/17 1005  TROPONINI 0.03* 0.03* 0.04* 0.03* <0.03   BNP (last 3 results) No results for input(s): PROBNP in the last 8760 hours. CBG: Recent Labs  Lab 12/07/17 2049 12/08/17 0726 12/08/17 1146 12/08/17 1636 12/08/17 2029  GLUCAP 149* 107* 91 223* 196*   D-Dimer: No results for input(s): DDIMER in the last 72 hours. Hgb A1c: Recent Labs    12/07/17 0206  HGBA1C 6.1*   Lipid Profile: Recent Labs    12/07/17 0206  CHOL 287*  HDL 73  LDLCALC 195*  TRIG 96  CHOLHDL 3.9   Thyroid function studies: No results for input(s): TSH, T4TOTAL, T3FREE, THYROIDAB in the last 72 hours.  Invalid input(s): FREET3 Anemia work up: No results for input(s): VITAMINB12, FOLATE, FERRITIN, TIBC, IRON, RETICCTPCT in the last 72 hours. Sepsis Labs: Recent Labs  Lab 12/06/17 1555 12/08/17 0544 12/09/17 0551  WBC 7.3 12.1* 6.7   Microbiology Recent Results  (from the past 240 hour(s))  Culture, blood (Routine X 2) w Reflex to ID Panel     Status: Abnormal (Preliminary result)   Collection Time: 12/06/17  9:46 PM  Result Value Ref Range Status   Specimen Description   Final    BLOOD LEFT ANTECUBITAL Performed at [facility], [address], [place], [place] [address]    Special Requests   Final    BOTTLES DRAWN AEROBIC AND ANAEROBIC Blood Culture adequate volume   Culture  Setup Time   Final    ANAEROBIC BOTTLE ONLY GRAM POSITIVE COCCI CRITICAL RESULT CALLED TO, READ BACK BY AND VERIFIED WITH: B GREEN PHARMD 0050 12/08/17 A BROWNING Performed at [facility], [address], [place], [place] [address]    Culture STAPHYLOCOCCUS EPIDERMIDIS (A)  Final   Report Status PENDING  Incomplete  Culture, blood (Routine X 2) w Reflex to ID Panel     Status: None (Preliminary result)   Collection Time: 12/06/17  9:46 PM  Result Value Ref Range Status   Specimen Description   Final    BLOOD RIGHT HAND Performed at [facility], [address] [place], [place] [address]    Special Requests  Final    BOTTLES DRAWN AEROBIC AND ANAEROBIC Blood Culture results may not be optimal due to an excessive volume of blood received in culture bottles Performed at [facility], [address] [place], [place] [address]    Culture   Final    NO GROWTH 1 DAY Performed at [facility], [address], [place], [place] [address]    Report Status PENDING  Incomplete  Blood Culture ID Panel (Reflexed)     Status: Abnormal   Collection Time: 12/06/17  9:46 PM  Result Value Ref Range Status   Enterococcus species NOT DETECTED NOT DETECTED Final   Listeria monocytogenes NOT DETECTED NOT DETECTED Final   Staphylococcus species DETECTED (A) NOT DETECTED Final    Comment: Methicillin (oxacillin) susceptible coagulase negative staphylococcus. Possible blood culture contaminant (unless isolated from  more than one blood culture draw or clinical case suggests pathogenicity). No antibiotic treatment is indicated for blood  culture contaminants. CRITICAL RESULT CALLED TO, READ BACK BY AND VERIFIED WITH: B GREEN PHARMD 0050 12/08/17 A BROWNING    Staphylococcus aureus (BCID) NOT DETECTED NOT DETECTED Final   Methicillin resistance NOT DETECTED NOT DETECTED Final   Streptococcus species NOT DETECTED NOT DETECTED Final   Streptococcus agalactiae NOT DETECTED NOT DETECTED Final   Streptococcus pneumoniae NOT DETECTED NOT DETECTED Final   Streptococcus pyogenes NOT DETECTED NOT DETECTED Final   Acinetobacter baumannii NOT DETECTED NOT DETECTED Final   Enterobacteriaceae species NOT DETECTED NOT DETECTED Final   Enterobacter cloacae complex NOT DETECTED NOT DETECTED Final   Escherichia coli NOT DETECTED NOT DETECTED Final   Klebsiella oxytoca NOT DETECTED NOT DETECTED Final   Klebsiella pneumoniae NOT DETECTED NOT DETECTED Final   Proteus species NOT DETECTED NOT DETECTED Final   Serratia marcescens NOT DETECTED NOT DETECTED Final   Haemophilus influenzae NOT DETECTED NOT DETECTED Final   Neisseria meningitidis NOT DETECTED NOT DETECTED Final   Pseudomonas aeruginosa NOT DETECTED NOT DETECTED Final   Candida albicans NOT DETECTED NOT DETECTED Final   Candida glabrata NOT DETECTED NOT DETECTED Final   Candida krusei NOT DETECTED NOT DETECTED Final   Candida parapsilosis NOT DETECTED NOT DETECTED Final   Candida tropicalis NOT DETECTED NOT DETECTED Final    Comment: Performed at [facility], [address] [place], [place] [address]  MRSA PCR Screening     Status: None   Collection Time: 12/07/17  9:39 AM  Result Value Ref Range Status   MRSA by PCR NEGATIVE NEGATIVE Final    Comment:        The GeneXpert MRSA Assay (FDA approved for NASAL specimens only), is one component of a comprehensive MRSA colonization surveillance program. It is not intended to diagnose MRSA infection  nor to guide or monitor treatment for MRSA infections. Performed at [facility], [address], [place], [place] [address]   Respiratory Panel by PCR     Status: None   Collection Time: 12/08/17 12:53 PM  Result Value Ref Range Status   Adenovirus NOT DETECTED NOT DETECTED Final   Coronavirus 229E NOT DETECTED NOT DETECTED Final   Coronavirus HKU1 NOT DETECTED NOT DETECTED Final   Coronavirus NL63 NOT DETECTED NOT DETECTED Final   Coronavirus OC43 NOT DETECTED NOT DETECTED Final   Metapneumovirus NOT DETECTED NOT DETECTED Final   Rhinovirus / Enterovirus NOT DETECTED NOT DETECTED Final   Influenza A NOT DETECTED NOT DETECTED Final   Influenza B NOT DETECTED NOT DETECTED  Final   Parainfluenza Virus 1 NOT DETECTED NOT DETECTED Final   Parainfluenza Virus 2 NOT DETECTED NOT DETECTED Final   Parainfluenza Virus 3 NOT DETECTED NOT DETECTED Final   Parainfluenza Virus 4 NOT DETECTED NOT DETECTED Final   Respiratory Syncytial Virus NOT DETECTED NOT DETECTED Final   Bordetella pertussis NOT DETECTED NOT DETECTED Final   Chlamydophila pneumoniae NOT DETECTED NOT DETECTED Final   Mycoplasma pneumoniae NOT DETECTED NOT DETECTED Final    Comment: Performed at [facility], [address], [place], [place] [address]     Medications:   . amLODipine  10 mg Oral Daily  . aspirin EC  325 mg Oral Daily  . azithromycin  250 mg Oral Daily  . enoxaparin (LOVENOX) injection  60 mg Subcutaneous Q24H  . furosemide  80 mg Intravenous Q8H  . insulin aspart  0-5 Units Subcutaneous QHS  . insulin aspart  0-9 Units Subcutaneous TID WC  . ipratropium-albuterol  3 mL Nebulization BID  . magnesium oxide  400 mg Oral QPM  . nicotine  21 mg Transdermal Daily  . potassium chloride  20 mEq Oral Daily  . predniSONE  10 mg Oral Q breakfast   Continuous Infusions:   LOS: 2 days   Marinda Elk  [facility] Pager [phone]  *Please refer to [email or web address], password [email or web address]  to get updated schedule on who will round on this patient, as hospitalists switch teams weekly. If 7PM-7AM, please contact night-coverage at [email or web address], password [email or web address] for any overnight needs.  12/09/2017, 7:06 AM

## 2017-12-09 NOTE — Progress Notes (Signed)
Pt was noted to have painful R foot tendons with very stiff ankle of 0 deg DF on R.  His ext tendons from toes on R foot are boggy over the top and very warm, and talked with pt about the limitations of his ROM affecting the foot.  He had chronic LE edema that may have led to the ROM limitations he has displayed, and further led to stress of foot tendons.  Follow acutely for ROM to legs and decision about AD, and should follow up after dc to work on inflammation on R foot along with flexibility.   12/09/17 1500  PT Visit Information  Last PT Received On 12/09/17  Assistance Needed +1  History of Present Illness 47 yo male with onset of acute bronchitis and cardiomegaly with pleural effusion was admitted, on 2L O2 at home but I function with no AD needed.  PMHx:  sarcoidosis, CHF, DM, EF 55%, OSA, myopathy, FSGS, HTN   Precautions  Precautions Fall  Precaution Comments pain with all WB on R foot dorsum  Restrictions  Weight Bearing Restrictions No  Home Living  Family/patient expects to be discharged to: Private residence  Living Arrangements Alone  Available Help at Discharge Family;Available PRN/intermittently  Type of Home House  Home Access Stairs to enter  Entrance Stairs-Number of Steps 3  Entrance Stairs-Rails Left;Right;Can reach both  Home Layout One level  Tour manager None  Prior Function  Level of Independence Independent  Communication  Communication No difficulties  Pain Assessment  Pain Assessment 0-10  Pain Score 9  Pain Location R foot on dorsum near lateral aspect  Pain Descriptors / Indicators Burning;Sharp;Grimacing  Pain Intervention(s) Limited activity within patient's tolerance;Monitored during session;Premedicated before session;Repositioned  Cognition  Arousal/Alertness Awake/alert  Behavior During Therapy WFL for tasks assessed/performed  Overall Cognitive Status Within Functional Limits for tasks assessed  Upper Extremity  Assessment  Upper Extremity Assessment Overall WFL for tasks assessed  Lower Extremity Assessment  Lower Extremity Assessment Generalized weakness  Cervical / Trunk Assessment  Cervical / Trunk Assessment Normal  Bed Mobility  General bed mobility comments pt up in chair when PT arrived  Transfers  Overall transfer level Needs assistance  Equipment used Rolling walker (2 wheeled);1 person hand held assist  Transfers Sit to/from Stand  Sit to Stand Supervision  General transfer comment cues for directing his efforts to stand and sit  Ambulation/Gait  Ambulation/Gait assistance Supervision  Gait Distance (Feet) 100 Feet  Assistive device Rolling walker (2 wheeled);1 person hand held assist  Gait Pattern/deviations Step-through pattern;Wide base of support;Trunk flexed;Decreased weight shift to right;Decreased stance time - right  General Gait Details off loaded on RW with pt objecting to use of walker stating he has crutches   Gait velocity reduced  Gait velocity interpretation <1.31 ft/sec, indicative of household ambulator  Balance  Overall balance assessment Needs assistance  Sitting-balance support Feet supported  Sitting balance-Leahy Scale Good  Standing balance support Bilateral upper extremity supported;During functional activity  Standing balance-Leahy Scale Good  Standing balance comment good static and fair dynamic balance  General Comments  General comments (skin integrity, edema, etc.) pt is able to walk with RW better than with no AD, discovered that his pain is worsened by stretches of R foot ROM   Exercises  Exercises Other exercises (noted ROM to ankles is 3 deg DF, hips in flexion contracture)  PT - End of Session  Equipment Utilized During Treatment Gait belt  Activity Tolerance  Patient tolerated treatment well  Patient left in chair;with call bell/phone within reach;with nursing/sitter in room  Nurse Communication Mobility status;Other (comment) (explanation  of pain on R foot)  PT Assessment  PT Visit Diagnosis Unsteadiness on feet (R26.81);Muscle weakness (generalized) (M62.81);Difficulty in walking, not elsewhere classified (R26.2)  PT Plan  PT Frequency (ACUTE ONLY) Min 3X/week  AM-PAC PT "6 Clicks" Daily Activity Outcome Measure  Difficulty turning over in bed (including adjusting bedclothes, sheets and blankets)? 4  Difficulty moving from lying on back to sitting on the side of the bed?  4  Difficulty sitting down on and standing up from a chair with arms (e.g., wheelchair, bedside commode, etc,.)? 4  Help needed moving to and from a bed to chair (including a wheelchair)? 4  Help needed walking in hospital room? 3  Help needed climbing 3-5 steps with a railing?  3  6 Click Score 22  Mobility G Code  CJ  PT Recommendation  Follow Up Recommendations Home health PT;Supervision - Intermittent  PT equipment Rolling walker with 5" wheels;Crutches (will determine which one is more appropriate)  Acute Rehab PT Goals  Patient Stated Goal to get rid of R foot pain  PT Goal Formulation With patient  Time For Goal Achievement 12/23/17  Potential to Achieve Goals Good  PT Time Calculation  PT Start Time (ACUTE ONLY) 1347  PT Stop Time (ACUTE ONLY) 1410  PT Time Calculation (min) (ACUTE ONLY) 23 min  PT General Charges  $$ ACUTE PT VISIT 1 Visit  PT Evaluation  $PT Eval Moderate Complexity 1 Mod  PT Treatments  $Gait Training 8-22 mins  Written Expression  Dominant Hand Right     Samul Dada, PT MS Acute Rehab Dept. Number: [facility] [id number] and [facility] [phone]

## 2017-12-10 LAB — CBC
HCT: 48.2 % (ref 39.0–52.0)
HEMOGLOBIN: 14.6 g/dL (ref 13.0–17.0)
MCH: 29.1 pg (ref 26.0–34.0)
MCHC: 30.3 g/dL (ref 30.0–36.0)
MCV: 96.2 fL (ref 80.0–100.0)
NRBC: 0 % (ref 0.0–0.2)
Platelets: 179 10*3/uL (ref 150–400)
RBC: 5.01 MIL/uL (ref 4.22–5.81)
RDW: 13.4 % (ref 11.5–15.5)
WBC: 7.2 10*3/uL (ref 4.0–10.5)

## 2017-12-10 LAB — GLUCOSE, CAPILLARY
GLUCOSE-CAPILLARY: 121 mg/dL — AB (ref 70–99)
GLUCOSE-CAPILLARY: 137 mg/dL — AB (ref 70–99)
Glucose-Capillary: 131 mg/dL — ABNORMAL HIGH (ref 70–99)
Glucose-Capillary: 129 mg/dL — ABNORMAL HIGH (ref 70–99)

## 2017-12-10 LAB — BASIC METABOLIC PANEL
Anion gap: 10 (ref 5–15)
BUN: 61 mg/dL — AB (ref 6–20)
CHLORIDE: 96 mmol/L — AB (ref 98–111)
CO2: 36 mmol/L — ABNORMAL HIGH (ref 22–32)
Calcium: 8.5 mg/dL — ABNORMAL LOW (ref 8.9–10.3)
Creatinine, Ser: 1.41 mg/dL — ABNORMAL HIGH (ref 0.61–1.24)
GFR calc Af Amer: >60 mL/min (ref >60)
GFR, EST NON AFRICAN AMERICAN: 57 mL/min — AB (ref >60)
GLUCOSE: 115 mg/dL — AB (ref 70–99)
Potassium: 4.3 mmol/L (ref 3.5–5.1)
Sodium: 142 mmol/L (ref 135–145)

## 2017-12-10 NOTE — Progress Notes (Signed)
PROGRESS NOTE    Hunter [name]  ZOX:[id number] DOB: 25-Sep-1970 DOA: 12/06/2017 PCP: Kallie Locks, FNP    Brief Narrative:  47 y.o. 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  Assessment & Plan:   Principal Problem:   Hypertensive urgency Active Problems:   HTN (hypertension)   Obesity hypoventilation syndrome (HCC)   RVF (right ventricular failure) (HCC)   Acute on chronic diastolic CHF (congestive heart failure) (HCC)   Sarcoidosis of lung (HCC)   Secondary pulmonary arterial hypertension (HCC)   Right foot pain   Elevated troponin   Tobacco abuse  Acute on chronic diastolic CHF/RVF (right ventricular failure) (HCC): -Most recent 2D echo on 10/2016 showed an EF of 55% with dilated right ventricle and elevated right-sided heart pressure on cardiac cath. -Previously documented lowest weight on 08/05/2017 was 120 kg -CTA negative for PE. -Lower ext [place]: negative for DVT. -Seen by Cardiology who recommended continuing home lasix -Pt was continued on lasix gtt overnight. Follow up Cr up to 1.4 -Will hold further diuretic and allow pt to self-hydrate by liberalizing fluid intake -Hold ACEI givien rising Cr -Repeat bmet in AM -He is currently on low-dose prednisone.  Upper respiratory tract infection with possible underlying acute bronchitis: -Currently on prednisone bronchodilators and antibiotics. -will continue with low-dose prednisone.  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 Delton Coombes is an outpatient.  History of sleep apnea/obesity hypoventilation syndrome: -Continue oxygen and CPAP at night as tolerated  Essential hypertension: -will continue on Amlodipine 10 mg, low-dose ACE inhibitor for better blood pressure  control.  FSGS: -Patient to follow-up with Dr. Layla Barter as an outpatient.  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  Hypertensive urgency -Blood pressure stable at this time, reviewed    Obesity hypoventilation syndrome (HCC)    ARF -Cr peaked to 1.4 today while on lasix gtt overnight -Have d/c'd lasix gtt -Liberalize fluid intake -Hold ACEI -Repeat bmet in AM  DVT prophylaxis: Lovenox subQ Code Status: Full Family Communication: Pt in room, family not at bedside Disposition Plan: Uncertain at this time  Consultants:   Cardiology  Procedures:     Antimicrobials: Anti-infectives (From admission, onward)   Start     Dose/Rate Route Frequency Ordered Stop   12/09/17 1000  azithromycin (ZITHROMAX) tablet 250 mg     250 mg Oral Daily 12/08/17 1238 12/13/17 0959   12/08/17 1400  azithromycin (ZITHROMAX) tablet 500 mg     500 mg Oral Daily 12/08/17 1238 12/08/17 1304       Subjective: Reports feeling better, questioning about going home  Objective: Vitals:   12/10/17 0546 12/10/17 0548 12/10/17 0814 12/10/17 1310  BP:  108/73  135/77  Pulse:  66  81  Resp:  20  19  Temp:  98 F (36.7 C)  98.2 F (36.8 C)  TempSrc:  Axillary  Oral  SpO2:  99% 96% 94%  Weight: 128.6 kg     Height:        Intake/Output Summary (Last 24 hours) at 12/10/2017 1524 Last data filed at 12/10/2017 1500 Gross per 24 hour  Intake 1466.52 ml  Output  2375 ml  Net -908.48 ml   Filed Weights   12/08/17 0556 12/09/17 0450 12/10/17 0546  Weight: 129.7 kg 129.1 kg 128.6 kg    Examination:  General exam: Appears calm and comfortable  Respiratory system: Clear to auscultation. Respiratory effort normal. Cardiovascular system: S1 & S2 heard, RRR Gastrointestinal system: Abdomen is nondistended, soft and nontender. No  organomegaly or masses felt. Normal bowel sounds heard. Central nervous system: Alert and oriented. No focal neurological deficits. Extremities: Symmetric 5 x 5 power. Skin: No rashes, lesions Psychiatry: Judgement and insight appear normal. Mood & affect appropriate.   Data Reviewed: I have personally reviewed following labs and imaging studies  CBC: Recent Labs  Lab 12/06/17 1555 12/08/17 0544 12/09/17 0551 12/10/17 0508  WBC 7.3 12.1* 6.7 7.2  NEUTROABS 5.5  --   --   --   HGB 14.0 13.6 14.3 14.6  HCT 47.0 44.0 47.3 48.2  MCV 97.7 94.4 95.9 96.2  PLT 145* 152 161 179   Basic Metabolic Panel: Recent Labs  Lab 12/06/17 1554 12/06/17 1555 12/08/17 0544 12/09/17 0551 12/10/17 0508  NA 140  --  137 138 142  K 4.0  --  4.1 4.1 4.3  CL 98  --  96* 97* 96*  CO2 36*  --  33* 32 36*  GLUCOSE 91  --  168* 99 115*  BUN 16  --  34* 44* 61*  CREATININE 0.91  --  0.94 1.14 1.41*  CALCIUM 8.3*  --  8.4* 8.3* 8.5*  MG  --  2.1  --  2.4  --    GFR: Estimated Creatinine Clearance: 87.8 mL/min (A) (by C-G formula based on SCr of 1.41 mg/dL (H)). Liver Function Tests: Recent Labs  Lab 12/06/17 1554  AST 27  ALT 26  ALKPHOS 64  BILITOT 1.3*  PROT 6.8  ALBUMIN 3.5   No results for input(s): LIPASE, AMYLASE in the last 168 hours. No results for input(s): AMMONIA in the last 168 hours. Coagulation Profile: Recent Labs  Lab 12/06/17 1554  INR 0.80   Cardiac Enzymes: Recent Labs  Lab 12/06/17 1555 12/06/17 2038 12/06/17 2146 12/07/17 0206 12/07/17 1005  TROPONINI 0.03* 0.03* 0.04* 0.03* <0.03   BNP (last 3 results) No results for input(s): PROBNP in the last 8760 hours. HbA1C: No results for input(s): HGBA1C in the last 72 hours. CBG: Recent Labs  Lab 12/09/17 1202 12/09/17 1734 12/09/17 2126 12/10/17 0753 12/10/17 1218  GLUCAP 141* 147* 130* 121* 131*   Lipid Profile: No results for input(s): CHOL, HDL, LDLCALC, TRIG, CHOLHDL, LDLDIRECT in the last 72  hours. Thyroid Function Tests: No results for input(s): TSH, T4TOTAL, FREET4, T3FREE, THYROIDAB in the last 72 hours. Anemia Panel: No results for input(s): VITAMINB12, FOLATE, FERRITIN, TIBC, IRON, RETICCTPCT in the last 72 hours. Sepsis Labs: No results for input(s): PROCALCITON, LATICACIDVEN in the last 168 hours.  Recent Results (from the past 240 hour(s))  Culture, blood (Routine X 2) w Reflex to ID Panel     Status: Abnormal   Collection Time: 12/06/17  9:46 PM  Result Value Ref Range Status   Specimen Description   Final    BLOOD LEFT ANTECUBITAL Performed at [facility], [address], [place], [place] [address]    Special Requests   Final    BOTTLES DRAWN AEROBIC AND ANAEROBIC Blood Culture adequate volume   Culture  Setup Time   Final    ANAEROBIC BOTTLE ONLY GRAM POSITIVE COCCI CRITICAL RESULT  CALLED TO, READ BACK BY AND VERIFIED WITH: B GREEN PHARMD 0050 12/08/17 A BROWNING    Culture (A)  Final    STAPHYLOCOCCUS EPIDERMIDIS THE SIGNIFICANCE OF ISOLATING THIS ORGANISM FROM A SINGLE SET OF BLOOD CULTURES WHEN MULTIPLE SETS ARE DRAWN IS UNCERTAIN. PLEASE NOTIFY THE MICROBIOLOGY DEPARTMENT WITHIN ONE WEEK IF SPECIATION AND SENSITIVITIES ARE REQUIRED. Performed at [facility], [address], [place], [place] [address]    Report Status 12/09/2017 FINAL  Final  Culture, blood (Routine X 2) w Reflex to ID Panel     Status: None (Preliminary result)   Collection Time: 12/06/17  9:46 PM  Result Value Ref Range Status   Specimen Description   Final    BLOOD RIGHT HAND Performed at [facility], [address], [place], [place] [address]    Special Requests   Final    BOTTLES DRAWN AEROBIC AND ANAEROBIC Blood Culture results may not be optimal due to an excessive volume of blood received in culture bottles Performed at [facility], [address] [place], [place] [address]    Culture   Final    NO GROWTH 3  DAYS Performed at [facility], [address] [place], [place] [address]    Report Status PENDING  Incomplete  Blood Culture ID Panel (Reflexed)     Status: Abnormal   Collection Time: 12/06/17  9:46 PM  Result Value Ref Range Status   Enterococcus species NOT DETECTED NOT DETECTED Final   Listeria monocytogenes NOT DETECTED NOT DETECTED Final   Staphylococcus species DETECTED (A) NOT DETECTED Final    Comment: Methicillin (oxacillin) susceptible coagulase negative staphylococcus. Possible blood culture contaminant (unless isolated from more than one blood culture draw or clinical case suggests pathogenicity). No antibiotic treatment is indicated for blood  culture contaminants. CRITICAL RESULT CALLED TO, READ BACK BY AND VERIFIED WITH: B GREEN PHARMD 0050 12/08/17 A BROWNING    Staphylococcus aureus (BCID) NOT DETECTED NOT DETECTED Final   Methicillin resistance NOT DETECTED NOT DETECTED Final   Streptococcus species NOT DETECTED NOT DETECTED Final   Streptococcus agalactiae NOT DETECTED NOT DETECTED Final   Streptococcus pneumoniae NOT DETECTED NOT DETECTED Final   Streptococcus pyogenes NOT DETECTED NOT DETECTED Final   Acinetobacter baumannii NOT DETECTED NOT DETECTED Final   Enterobacteriaceae species NOT DETECTED NOT DETECTED Final   Enterobacter cloacae complex NOT DETECTED NOT DETECTED Final   Escherichia coli NOT DETECTED NOT DETECTED Final   Klebsiella oxytoca NOT DETECTED NOT DETECTED Final   Klebsiella pneumoniae NOT DETECTED NOT DETECTED Final   Proteus species NOT DETECTED NOT DETECTED Final   Serratia marcescens NOT DETECTED NOT DETECTED Final   Haemophilus influenzae NOT DETECTED NOT DETECTED Final   Neisseria meningitidis NOT DETECTED NOT DETECTED Final   Pseudomonas aeruginosa NOT DETECTED NOT DETECTED Final   Candida albicans NOT DETECTED NOT DETECTED Final   Candida glabrata NOT DETECTED NOT DETECTED Final   Candida krusei NOT DETECTED NOT DETECTED Final     Candida parapsilosis NOT DETECTED NOT DETECTED Final   Candida tropicalis NOT DETECTED NOT DETECTED Final    Comment: Performed at [facility], [address], [place], [place] [address]  MRSA PCR Screening     Status: None   Collection Time: 12/07/17  9:39 AM  Result Value Ref Range Status   MRSA by PCR NEGATIVE NEGATIVE Final    Comment:        The GeneXpert MRSA Assay (  FDA approved for NASAL specimens only), is one component of a comprehensive MRSA colonization surveillance program. It is not intended to diagnose MRSA infection nor to guide or monitor treatment for MRSA infections. Performed at [facility], [address] [place], [place] [address]   Respiratory Panel by PCR     Status: None   Collection Time: 12/08/17 12:53 PM  Result Value Ref Range Status   Adenovirus NOT DETECTED NOT DETECTED Final   Coronavirus 229E NOT DETECTED NOT DETECTED Final   Coronavirus HKU1 NOT DETECTED NOT DETECTED Final   Coronavirus NL63 NOT DETECTED NOT DETECTED Final   Coronavirus OC43 NOT DETECTED NOT DETECTED Final   Metapneumovirus NOT DETECTED NOT DETECTED Final   Rhinovirus / Enterovirus NOT DETECTED NOT DETECTED Final   Influenza A NOT DETECTED NOT DETECTED Final   Influenza B NOT DETECTED NOT DETECTED Final   Parainfluenza Virus 1 NOT DETECTED NOT DETECTED Final   Parainfluenza Virus 2 NOT DETECTED NOT DETECTED Final   Parainfluenza Virus 3 NOT DETECTED NOT DETECTED Final   Parainfluenza Virus 4 NOT DETECTED NOT DETECTED Final   Respiratory Syncytial Virus NOT DETECTED NOT DETECTED Final   Bordetella pertussis NOT DETECTED NOT DETECTED Final   Chlamydophila pneumoniae NOT DETECTED NOT DETECTED Final   Mycoplasma pneumoniae NOT DETECTED NOT DETECTED Final    Comment: Performed at [facility], [address] [place], [place] [address]     Radiology Studies: No results found.  Scheduled Meds: . amLODipine  10 mg Oral Daily  . aspirin EC   325 mg Oral Daily  . azithromycin  250 mg Oral Daily  . enoxaparin (LOVENOX) injection  60 mg Subcutaneous Q24H  . insulin aspart  0-5 Units Subcutaneous QHS  . insulin aspart  0-9 Units Subcutaneous TID WC  . ipratropium-albuterol  3 mL Nebulization BID  . magnesium oxide  400 mg Oral QPM  . nicotine  21 mg Transdermal Daily  . potassium chloride  20 mEq Oral Daily  . predniSONE  10 mg Oral Q breakfast   Continuous Infusions:   LOS: 3 days   Rickey Barbara, MD [facility] Pager On Amion  If 7PM-7AM, please contact night-coverage 12/10/2017, 3:24 PM

## 2017-12-11 DIAGNOSIS — E662 Morbid (severe) obesity with alveolar hypoventilation: Secondary | ICD-10-CM

## 2017-12-11 DIAGNOSIS — I5033 Acute on chronic diastolic (congestive) heart failure: Secondary | ICD-10-CM

## 2017-12-11 LAB — BASIC METABOLIC PANEL
ANION GAP: 10 (ref 5–15)
BUN: 56 mg/dL — ABNORMAL HIGH (ref 6–20)
CALCIUM: 8.5 mg/dL — AB (ref 8.9–10.3)
CO2: 35 mmol/L — ABNORMAL HIGH (ref 22–32)
CREATININE: 1.12 mg/dL (ref 0.61–1.24)
Chloride: 96 mmol/L — ABNORMAL LOW (ref 98–111)
GFR calc Af Amer: >60 mL/min (ref >60)
GLUCOSE: 94 mg/dL (ref 70–99)
Potassium: 4.2 mmol/L (ref 3.5–5.1)
Sodium: 141 mmol/L (ref 135–145)

## 2017-12-11 LAB — GLUCOSE, CAPILLARY
GLUCOSE-CAPILLARY: 115 mg/dL — AB (ref 70–99)
Glucose-Capillary: 94 mg/dL (ref 70–99)

## 2017-12-11 LAB — CBC
HCT: 48.9 % (ref 39.0–52.0)
Hemoglobin: 14.8 g/dL (ref 13.0–17.0)
MCH: 29.1 pg (ref 26.0–34.0)
MCHC: 30.3 g/dL (ref 30.0–36.0)
MCV: 96.1 fL (ref 80.0–100.0)
PLATELETS: 191 10*3/uL (ref 150–400)
RBC: 5.09 MIL/uL (ref 4.22–5.81)
RDW: 13.2 % (ref 11.5–15.5)
WBC: 6.6 10*3/uL (ref 4.0–10.5)
nRBC: 0 % (ref 0.0–0.2)

## 2017-12-11 MED ORDER — AMLODIPINE BESYLATE 10 MG PO TABS: 10.0000 mg | ORAL_TABLET | Freq: Every day | ORAL | 2 refills | Status: DC

## 2017-12-11 MED ORDER — PREDNISONE 10 MG PO TABS: 10.0000 mg | ORAL_TABLET | Freq: Every day | ORAL | 0 refills | Status: AC

## 2017-12-11 MED ORDER — POTASSIUM CHLORIDE CRYS ER 20 MEQ PO TBCR: 20.0000 meq | EXTENDED_RELEASE_TABLET | Freq: Every day | ORAL | 2 refills | Status: DC

## 2017-12-11 NOTE — Progress Notes (Signed)
Contacted [facility] to deliver portable oxygen tank to room for dc. Isidoro Donning RN CCM Case Mgmt phone [phone]

## 2017-12-11 NOTE — Progress Notes (Signed)
Patient discharged back to [facility] [employer]- taxi voucher provided and called. [employer] icked up pt at main entrance. Printed prescriptions and discharge instructions given to pt who verbalized understanding. Home medications returned to pt and narcotics counted with pt. All belongings sent with pt. Portable O2 tank provided for pt to be D/C'd with. Pt assisted to exit via W/C by nursing staff.

## 2017-12-11 NOTE — Progress Notes (Signed)
CSW contacted by patient's nurse and informed of patient's readiness for discharge. CSW aware patient needs a taxi voucher.   CSW to provide taxi voucher to patient's nurse.   CSW signing off.  Enid Cutter, LCSW-A Clinical Social Worker

## 2017-12-11 NOTE — Discharge Summary (Signed)
Physician Discharge Summary  Hunter [name] JWJ:[id number] DOB: 1971-01-06 DOA: 12/06/2017  PCP: Kallie Locks, FNP  Admit date: 12/06/2017 Discharge date: 12/11/2017  Time spent: 45 minutes  Recommendations for Outpatient Follow-up:  -To be discharged home today. -Has follow-up appointments scheduled with both primary care and with cardiology.  Discharge Diagnoses:  Principal Problem:   Hypertensive urgency Active Problems:   HTN (hypertension)   Obesity hypoventilation syndrome (HCC)   RVF (right ventricular failure) (HCC)   Acute on chronic diastolic CHF (congestive heart failure) (HCC)   Sarcoidosis of lung (HCC)   Secondary pulmonary arterial hypertension (HCC)   Right foot pain   Elevated troponin   Tobacco abuse   Discharge Condition: Stable and improved  Filed Weights   12/09/17 0450 12/10/17 0546 12/11/17 0622  Weight: 129.1 kg 128.6 kg 129.2 kg    History of present illness:  As per Dr. Clyde Lundborg on 10/29: Hunter [name] is a 47 y.o. male with medical history significant of hypertension, prediabetes, sarcoidosis on 3 L nasal cannula oxygen at home, dCHF, OSA on CPAP, nephrotic syndrome, tobacco abuse, who presents with right foot pain and SOB.  Patient states that he has been having right foot swelling, redness and pain in the past 3 days, which has been progressively getting worse.  The pain is constant, 10 out of 10 severity, sharp, nonradiating.  No fever chills.  No injury.  Patient states that he had similar issue 12-month ago, and was treated with steroid for possible gout.  He did not receive any antibiotics that time.   Patient also reports cough and shortness of breath.  No chest pain.  He coughs up greenish, sometimes brownish colored sputum.  No tenderness in calf areas.  Patient does not have nausea, vomiting, diarrhea, abdominal pain, symptoms of UTI.  No unilateral weakness.  Patient states that he ran out of his torsemide since yesterday. He just  completed a course of prednisone 40 mg yesterday for sarcoidosis.  ED Course: pt was found to have elevated blood pressure 212/102-->179/130, tachycardia, tachypnea, oxygen saturation 93--97% on 3 L nasal cannula oxygen, BNP 72.6, temperature normal.  Troponin +0.03, WBC 7.3, INR 0.80, electrolytes renal function okay, chest x-ray showed mild interstitial pulmonary edema.  X-ray of right foot is negative for bony fracture. CT angiogram of chest is a limited study, but no central PE, showed chronic sarcoidosis, and pulmonary hypertension. Patient is placed on telemetry bed for observation.  Hospital Course:   Right-sided heart failure -Presumably due to history of sarcoidosis.  Most recent echo in September 2018 showed an ejection fraction of 55% with a dilated right ventricle and elevated right-sided heart pressures on cardiac catheterization. -Lower extremity Doppler was negative for DVT, CT Angie Cheree Ditto is negative for PE. -She was seen in consultation by cardiology who recommended continuing home doses of Lasix.  Upper respiratory tract infection with underlying acute bronchitis -He was started on prednisone as an outpatient, has 3 days remaining which will be continued on discharge.  Next  History of sleep apnea/obesity hypoventilation syndrome: -Continue oxygen and CPAP at bedtime.  Hypertension -Continue Norvasc.  Focal segmental glomerulosclerosis/acute on chronic kidney disease -To follow-up with nephrology as an outpatient. -Acute component presumably due to overdiuresis with Lasix. -Lasix drip was discontinued 24 hours ago and received a small bolus of IV fluids, creatinine has returned to baseline upon discharge.  Type 2 diabetes -Most recent A1c of 6.1, fair control while hospitalized.  Procedures:  None   Consultations:  Cardiology  Discharge Instructions  Discharge Instructions    Diet - low sodium heart healthy   Complete by:  As directed    Increase activity  slowly   Complete by:  As directed      Allergies as of 12/11/2017   No Known Allergies     Medication List    TAKE these medications   albuterol 108 (90 Base) MCG/ACT inhaler Commonly known as:  PROVENTIL HFA;VENTOLIN HFA Inhale 2 puffs into the lungs every 6 (six) hours as needed for wheezing or shortness of breath.   amLODipine 10 MG tablet Commonly known as:  NORVASC Take 1 tablet (10 mg total) by mouth daily. Start taking on:  12/12/2017   ciclopirox 8 % solution Commonly known as:  PENLAC APPLY TOPICALLY AT BEDTIME OVER NAIL AND SURROUNDING SKIN. APPLY DAILY OVER PREVIOUS COAT. AFTER 7 DAYS, MAY REMOVE WITH ALCOHOL AND CONTINU What changed:  See the new instructions.   diclofenac sodium 1 % Gel Commonly known as:  VOLTAREN APPLY 2 GRAMS TO FOOT (AND AFFECTED AREAS) 2-4 TIMES DAILY.   HYDROcodone-acetaminophen 5-325 MG tablet Commonly known as:  NORCO/VICODIN Take 1 tablet by mouth every 6 (six) hours as needed for moderate pain.   magnesium oxide 400 (241.3 Mg) MG tablet Commonly known as:  MAG-OX Take 1 tablet (400 mg total) by mouth every evening.   metFORMIN 500 MG tablet Commonly known as:  GLUCOPHAGE Take 1 tablet (500 mg total) by mouth 2 (two) times daily with a meal.   potassium chloride SA 20 MEQ tablet Commonly known as:  K-DUR,KLOR-CON Take 1 tablet (20 mEq total) by mouth daily. Start taking on:  12/12/2017   predniSONE 10 MG tablet Commonly known as:  DELTASONE Take 1 tablet (10 mg total) by mouth daily with breakfast for 3 days. Start taking on:  12/12/2017 What changed:    medication strength  how much to take  how to take this  when to take this  additional instructions   torsemide 20 MG tablet Commonly known as:  DEMADEX Take 40 mg by mouth daily. What changed:  Another medication with the same name was removed. Continue taking this medication, and follow the directions you see here.   traMADol 50 MG tablet Commonly known as:   ULTRAM Take 1 tablet (50 mg total) by mouth 2 (two) times daily as needed. What changed:  reasons to take this      No Known Allergies Follow-up Information    [facility]. Schedule an appointment as soon as possible for a visit.   Contact information: [address] [place] [address] [phone]       [facility] Follow up.   Specialty:  Cardiology Why:  Cardiology Heart Failure hospital follow up on 12/19/17 at 10:00. Parking Code 1800.  Contact information: [address] [id number] Wilhemina Bonito [place] [address] [phone]           The results of significant diagnostics from this hospitalization (including imaging, microbiology, ancillary and laboratory) are listed below for reference.    Significant Diagnostic Studies: Dg Chest 2 View  Result Date: 12/06/2017 CLINICAL DATA:  Shortness of breath EXAM: CHEST - 2 VIEW COMPARISON:  08/05/2017 FINDINGS: Mild cardiomegaly with bilateral hilar enlargement. There is bilateral interstitial opacities, slightly worsened from the prior study. Chronic right lung volume loss with small pleural effusion. IMPRESSION: 1. Cardiomegaly with increased interstitial opacities, likely mild interstitial pulmonary  edema superimposed on chronic interstitial changes related sarcoidosis. 2. Chronic right lung volume loss with small pleural effusion. Electronically Signed   By: Deatra Robinson M.D.   On: 12/06/2017 16:45   Ct Angio Chest Pe W And/or Wo Contrast  Result Date: 12/06/2017 CLINICAL DATA:  Bilateral ankle pain and swelling x2 days. Patient ran out of CHF medication yesterday. History of sarcoid. EXAM: CT ANGIOGRAPHY CHEST WITH CONTRAST TECHNIQUE: Multidetector CT imaging of the chest was performed using the standard protocol during bolus administration of intravenous contrast. Multiplanar CT image reconstructions and MIPs were  obtained to evaluate the vascular anatomy. CONTRAST:  ISOVUE-370 IOPAMIDOL (ISOVUE-370) INJECTION 76% COMPARISON:  11/11/2016 FINDINGS: Cardiovascular: Chronic dilatation of the main pulmonary artery to 4.3 cm consistent with chronic pulmonary hypertension. No large central pulmonary embolus is identified. Opacification of the pulmonary arteries is limited beyond the lobar level due to motion artifacts. Small peripheral pulmonary emboli would be difficult to entirely exclude but is not conclusive. Heart size is enlarged without pericardial effusion. Stable appearance of the thoracic aorta without significant atherosclerotic calcifications. No coronary arterial calcifications. Mediastinum/Nodes: No enlarged mediastinal, hilar, or axillary lymph nodes. Thyroid gland, trachea, and esophagus demonstrate no significant findings. Lungs/Pleura: Chronic right-sided pleural thickening calcifications similar prior exam. Trace right-sided pleural effusion. Chronic pleuroparenchymal architectural distortion and linear scarring along periphery the right lung and periphery of left upper lobe similar in appearance to prior. Some scarring along the periphery of the left lung is calcified. No suspicious pulmonary nodules or masses. Upper Abdomen: Calcified retroperitoneal lymph nodes similar to prior. Musculoskeletal: No aggressive osseous lesions.  No acute fracture. Review of the MIP images confirms the above findings. IMPRESSION: 1. Cardiomegaly with chronic marked dilatation of the main pulmonary artery compatible pulmonary hypertension. 2. No large central pulmonary embolus is identified. Peripheral pulmonary arteries are limited due to timing of contrast motion artifacts. 3. Subpleural areas of scarring bilaterally as above. 4. Stigmata of chronic sarcoidosis given calcified lymph nodes in the upper abdomen. Electronically Signed   By: Tollie Eth M.D.   On: 12/06/2017 19:55   Dg Foot Complete Right  Result Date:  12/06/2017 CLINICAL DATA:  Generalized right foot pain for 3 days. EXAM: RIGHT FOOT COMPLETE - 3+ VIEW COMPARISON:  March 01, 2017 FINDINGS: There is no evidence of fracture or dislocation. Small plantar calcaneal spur is noted. Soft tissues are unremarkable. IMPRESSION: No acute fracture dislocation. Electronically Signed   By: Sherian Rein M.D.   On: 12/06/2017 16:41   Vas [place] Lower Extremity Venous (dvt)  Result Date: 12/07/2017  Lower Venous Study Indications: Swelling, and Edema.  Limitations: Body habitus and musculoskeletal features. Performing Technologist: Annamaria Helling  Examination Guidelines: A complete evaluation includes B-mode imaging, spectral Doppler, color Doppler, and power Doppler as needed of all accessible portions of each vessel. Bilateral testing is considered an integral part of a complete examination. Limited examinations for reoccurring indications may be performed as noted.  Right Venous Findings: +---------+---------------+---------+-----------+----------+-------+          CompressibilityPhasicitySpontaneityPropertiesSummary +---------+---------------+---------+-----------+----------+-------+ CFV      Full           Yes      Yes                          +---------+---------------+---------+-----------+----------+-------+ SFJ      Full                                                 +---------+---------------+---------+-----------+----------+-------+  FV Prox  Full                                                 +---------+---------------+---------+-----------+----------+-------+ FV Mid   Full                                                 +---------+---------------+---------+-----------+----------+-------+ FV DistalFull                                                 +---------+---------------+---------+-----------+----------+-------+ PFV      Full                                                  +---------+---------------+---------+-----------+----------+-------+ POP      Full           Yes      Yes                          +---------+---------------+---------+-----------+----------+-------+ PTV      Full                                                 +---------+---------------+---------+-----------+----------+-------+ PERO     Full                                                 +---------+---------------+---------+-----------+----------+-------+  Left Venous Findings: +---------+---------------+---------+-----------+----------+-------+          CompressibilityPhasicitySpontaneityPropertiesSummary +---------+---------------+---------+-----------+----------+-------+ CFV      Full           Yes      Yes                          +---------+---------------+---------+-----------+----------+-------+ SFJ      Full                                                 +---------+---------------+---------+-----------+----------+-------+ FV Prox  Full                                                 +---------+---------------+---------+-----------+----------+-------+ FV Mid   Full                                                 +---------+---------------+---------+-----------+----------+-------+  FV DistalFull                                                 +---------+---------------+---------+-----------+----------+-------+ PFV      Full                                                 +---------+---------------+---------+-----------+----------+-------+ POP      Full           Yes      Yes                          +---------+---------------+---------+-----------+----------+-------+ PTV      Full                                                 +---------+---------------+---------+-----------+----------+-------+ PERO     Full                                                  +---------+---------------+---------+-----------+----------+-------+    Summary: Right: There is no evidence of deep vein thrombosis in the lower extremity. However, portions of this examination were limited- see technologist comments above. No cystic structure found in the popliteal fossa. Left: There is no evidence of deep vein thrombosis in the lower extremity. However, portions of this examination were limited- see technologist comments above. No cystic structure found in the popliteal fossa.  *See table(s) above for measurements and observations. Electronically signed by Gretta Began MD on 12/07/2017 at 3:32:27 PM.    Final     Microbiology: Recent Results (from the past 240 hour(s))  Culture, blood (Routine X 2) w Reflex to ID Panel     Status: Abnormal   Collection Time: 12/06/17  9:46 PM  Result Value Ref Range Status   Specimen Description   Final    BLOOD LEFT ANTECUBITAL Performed at [facility], [address] [place], [place] [address]    Special Requests   Final    BOTTLES DRAWN AEROBIC AND ANAEROBIC Blood Culture adequate volume   Culture  Setup Time   Final    ANAEROBIC BOTTLE ONLY GRAM POSITIVE COCCI CRITICAL RESULT CALLED TO, READ BACK BY AND VERIFIED WITH: B GREEN PHARMD 0050 12/08/17 A BROWNING    Culture (A)  Final    STAPHYLOCOCCUS EPIDERMIDIS THE SIGNIFICANCE OF ISOLATING THIS ORGANISM FROM A SINGLE SET OF BLOOD CULTURES WHEN MULTIPLE SETS ARE DRAWN IS UNCERTAIN. PLEASE NOTIFY THE MICROBIOLOGY DEPARTMENT WITHIN ONE WEEK IF SPECIATION AND SENSITIVITIES ARE REQUIRED. Performed at [facility], [address] [place], [place] [address]    Report Status 12/09/2017 FINAL  Final  Culture, blood (Routine X 2) w Reflex to ID Panel     Status: None (Preliminary result)   Collection Time: 12/06/17  9:46 PM  Result Value Ref Range Status   Specimen Description   Final    BLOOD RIGHT HAND Performed at  [facility], [address] [place], [place] [address]    Special Requests   Final    BOTTLES DRAWN AEROBIC AND ANAEROBIC Blood Culture results may not be optimal due to an excessive volume of blood received in culture bottles Performed at [facility], [address] [place], [place] [address]    Culture   Final    NO GROWTH 3 DAYS Performed at [facility], [address] [place], [place] [address]    Report Status PENDING  Incomplete  Blood Culture ID Panel (Reflexed)     Status: Abnormal   Collection Time: 12/06/17  9:46 PM  Result Value Ref Range Status   Enterococcus species NOT DETECTED NOT DETECTED Final   Listeria monocytogenes NOT DETECTED NOT DETECTED Final   Staphylococcus species DETECTED (A) NOT DETECTED Final    Comment: Methicillin (oxacillin) susceptible coagulase negative staphylococcus. Possible blood culture contaminant (unless isolated from more than one blood culture draw or clinical case suggests pathogenicity). No antibiotic treatment is indicated for blood  culture contaminants. CRITICAL RESULT CALLED TO, READ BACK BY AND VERIFIED WITH: B GREEN PHARMD 0050 12/08/17 A BROWNING    Staphylococcus aureus (BCID) NOT DETECTED NOT DETECTED Final   Methicillin resistance NOT DETECTED NOT DETECTED Final   Streptococcus species NOT DETECTED NOT DETECTED Final   Streptococcus agalactiae NOT DETECTED NOT DETECTED Final   Streptococcus pneumoniae NOT DETECTED NOT DETECTED Final   Streptococcus pyogenes NOT DETECTED NOT DETECTED Final   Acinetobacter baumannii NOT DETECTED NOT DETECTED Final   Enterobacteriaceae species NOT DETECTED NOT DETECTED Final   Enterobacter cloacae complex NOT DETECTED NOT DETECTED Final   Escherichia coli NOT DETECTED NOT DETECTED Final   Klebsiella oxytoca NOT DETECTED NOT DETECTED Final   Klebsiella pneumoniae NOT DETECTED NOT DETECTED Final   Proteus species NOT DETECTED NOT DETECTED Final   Serratia marcescens NOT DETECTED NOT DETECTED Final    Haemophilus influenzae NOT DETECTED NOT DETECTED Final   Neisseria meningitidis NOT DETECTED NOT DETECTED Final   Pseudomonas aeruginosa NOT DETECTED NOT DETECTED Final   Candida albicans NOT DETECTED NOT DETECTED Final   Candida glabrata NOT DETECTED NOT DETECTED Final   Candida krusei NOT DETECTED NOT DETECTED Final   Candida parapsilosis NOT DETECTED NOT DETECTED Final   Candida tropicalis NOT DETECTED NOT DETECTED Final    Comment: Performed at [facility], [address], [place], [place] [address]  MRSA PCR Screening     Status: None   Collection Time: 12/07/17  9:39 AM  Result Value Ref Range Status   MRSA by PCR NEGATIVE NEGATIVE Final    Comment:        The GeneXpert MRSA Assay (FDA approved for NASAL specimens only), is one component of a comprehensive MRSA colonization surveillance program. It is not intended to diagnose MRSA infection nor to guide or monitor treatment for MRSA infections. Performed at [facility], [address], [place], [place] [address]   Respiratory Panel by PCR     Status: None   Collection Time: 12/08/17 12:53 PM  Result Value Ref Range Status   Adenovirus NOT DETECTED NOT DETECTED Final   Coronavirus 229E NOT DETECTED NOT DETECTED Final   Coronavirus HKU1 NOT DETECTED NOT DETECTED Final   Coronavirus NL63 NOT DETECTED NOT DETECTED Final   Coronavirus OC43 NOT DETECTED NOT DETECTED Final   Metapneumovirus NOT DETECTED NOT DETECTED Final   Rhinovirus / Enterovirus NOT DETECTED NOT DETECTED  Final   Influenza A NOT DETECTED NOT DETECTED Final   Influenza B NOT DETECTED NOT DETECTED Final   Parainfluenza Virus 1 NOT DETECTED NOT DETECTED Final   Parainfluenza Virus 2 NOT DETECTED NOT DETECTED Final   Parainfluenza Virus 3 NOT DETECTED NOT DETECTED Final   Parainfluenza Virus 4 NOT DETECTED NOT DETECTED Final   Respiratory Syncytial Virus NOT DETECTED NOT DETECTED Final   Bordetella pertussis NOT DETECTED NOT  DETECTED Final   Chlamydophila pneumoniae NOT DETECTED NOT DETECTED Final   Mycoplasma pneumoniae NOT DETECTED NOT DETECTED Final    Comment: Performed at [facility], [address] [place], [place] [address]     Labs: Basic Metabolic Panel: Recent Labs  Lab 12/06/17 1554 12/06/17 1555 12/08/17 0544 12/09/17 0551 12/10/17 0508 12/11/17 0527  NA 140  --  137 138 142 141  K 4.0  --  4.1 4.1 4.3 4.2  CL 98  --  96* 97* 96* 96*  CO2 36*  --  33* 32 36* 35*  GLUCOSE 91  --  168* 99 115* 94  BUN 16  --  34* 44* 61* 56*  CREATININE 0.91  --  0.94 1.14 1.41* 1.12  CALCIUM 8.3*  --  8.4* 8.3* 8.5* 8.5*  MG  --  2.1  --  2.4  --   --    Liver Function Tests: Recent Labs  Lab 12/06/17 1554  AST 27  ALT 26  ALKPHOS 64  BILITOT 1.3*  PROT 6.8  ALBUMIN 3.5   No results for input(s): LIPASE, AMYLASE in the last 168 hours. No results for input(s): AMMONIA in the last 168 hours. CBC: Recent Labs  Lab 12/06/17 1555 12/08/17 0544 12/09/17 0551 12/10/17 0508 12/11/17 0527  WBC 7.3 12.1* 6.7 7.2 6.6  NEUTROABS 5.5  --   --   --   --   HGB 14.0 13.6 14.3 14.6 14.8  HCT 47.0 44.0 47.3 48.2 48.9  MCV 97.7 94.4 95.9 96.2 96.1  PLT 145* 152 161 179 191   Cardiac Enzymes: Recent Labs  Lab 12/06/17 1555 12/06/17 2038 12/06/17 2146 12/07/17 0206 12/07/17 1005  TROPONINI 0.03* 0.03* 0.04* 0.03* <0.03   BNP: BNP (last 3 results) Recent Labs    12/21/16 0856 12/06/17 1555  BNP 160.7* 72.6    ProBNP (last 3 results) No results for input(s): PROBNP in the last 8760 hours.  CBG: Recent Labs  Lab 12/10/17 1218 12/10/17 1650 12/10/17 2144 12/11/17 0731 12/11/17 1200  GLUCAP 131* 129* 137* 115* 94       Signed:  Chaya Jan  [facility] Pager: [phone] 12/11/2017, 3:08 PM

## 2017-12-12 ENCOUNTER — Telehealth: Payer: Self-pay

## 2017-12-12 ENCOUNTER — Other Ambulatory Visit (HOSPITAL_COMMUNITY): Payer: Self-pay

## 2017-12-12 ENCOUNTER — Other Ambulatory Visit: Payer: Self-pay | Admitting: Family Medicine

## 2017-12-12 DIAGNOSIS — F17200 Nicotine dependence, unspecified, uncomplicated: Secondary | ICD-10-CM

## 2017-12-12 MED ORDER — TORSEMIDE 20 MG PO TABS: 40.0000 mg | ORAL_TABLET | Freq: Every day | ORAL | 5 refills | Status: DC

## 2017-12-12 MED ORDER — NICOTINE 7 MG/24HR TD PT24: 7.0000 mg | MEDICATED_PATCH | TRANSDERMAL | 2 refills | Status: DC

## 2017-12-12 MED FILL — POTASSIUM CL ER 20 MEQ TABL: 20 | 30 days supply | Qty: 30 | Fill #0

## 2017-12-12 MED FILL — TORSEMIDE 20 MG TABLET: 20 | 35 days supply | Qty: 70 | Fill #0

## 2017-12-12 MED FILL — AMLODIPINE BESYLATE 10 MG T: 10 | 30 days supply | Qty: 30 | Fill #0

## 2017-12-12 MED FILL — predniSONE 10 MG TABS: 10 | 3 days supply | Qty: 3 | Fill #0

## 2017-12-12 NOTE — Telephone Encounter (Signed)
Patient would like to have a script sent in for the nicotine patches that he was given in the hospital. Patient is aware that you are out of office until 12/13/2017.

## 2017-12-12 NOTE — Progress Notes (Unsigned)
Rx for Nicotine Patches sent to pharmacy today.

## 2017-12-13 NOTE — Telephone Encounter (Signed)
Left a vm for patient to callback 

## 2017-12-14 ENCOUNTER — Telehealth: Payer: Self-pay | Admitting: Emergency Medicine

## 2017-12-14 ENCOUNTER — Ambulatory Visit: Payer: Self-pay | Admitting: Family Medicine

## 2017-12-14 DIAGNOSIS — G4733 Obstructive sleep apnea (adult) (pediatric): Secondary | ICD-10-CM

## 2017-12-14 LAB — CULTURE, BLOOD (ROUTINE X 2): Culture: NO GROWTH

## 2017-12-14 NOTE — Telephone Encounter (Signed)
Called and spoke with patient he is returning a call to the [facility] about program. Will route to pcc.

## 2017-12-14 NOTE — Telephone Encounter (Signed)
Pt is going to call me after he goes to Social services tomorrow

## 2017-12-14 NOTE — Telephone Encounter (Signed)
New order has been placed. Nothing further was needed. 

## 2017-12-19 ENCOUNTER — Encounter (HOSPITAL_COMMUNITY): Payer: Self-pay

## 2017-12-20 ENCOUNTER — Other Ambulatory Visit: Payer: Self-pay

## 2017-12-20 DIAGNOSIS — F17200 Nicotine dependence, unspecified, uncomplicated: Secondary | ICD-10-CM

## 2017-12-20 MED ORDER — NICOTINE 7 MG/24HR TD PT24: 7.0000 mg | MEDICATED_PATCH | TRANSDERMAL | 2 refills | Status: DC

## 2017-12-20 MED FILL — NICOTINE 7 MG/24HR PATCH: 7 | 28 days supply | Qty: 28 | Fill #0

## 2017-12-20 NOTE — Telephone Encounter (Signed)
Medication sent.

## 2017-12-26 ENCOUNTER — Encounter (HOSPITAL_COMMUNITY): Payer: Self-pay

## 2017-12-26 ENCOUNTER — Other Ambulatory Visit: Payer: Self-pay

## 2017-12-26 ENCOUNTER — Ambulatory Visit (HOSPITAL_COMMUNITY)
Admission: RE | Admit: 2017-12-26 | Discharge: 2017-12-26 | Disposition: A | Discharge status: Home / Self Care | Payer: Medicaid Other | Source: Ambulatory Visit | Attending: Cardiology | Admitting: Cardiology

## 2017-12-26 VITALS — BP 146/88 | HR 88 | Wt 291.4 lb

## 2017-12-26 DIAGNOSIS — I50810 Right heart failure, unspecified: Secondary | ICD-10-CM

## 2017-12-26 DIAGNOSIS — I5082 Biventricular heart failure: Secondary | ICD-10-CM | POA: Diagnosis not present

## 2017-12-26 DIAGNOSIS — Z9981 Dependence on supplemental oxygen: Secondary | ICD-10-CM | POA: Insufficient documentation

## 2017-12-26 DIAGNOSIS — G4733 Obstructive sleep apnea (adult) (pediatric): Secondary | ICD-10-CM

## 2017-12-26 DIAGNOSIS — K921 Melena: Secondary | ICD-10-CM | POA: Diagnosis not present

## 2017-12-26 DIAGNOSIS — I110 Hypertensive heart disease with heart failure: Secondary | ICD-10-CM | POA: Insufficient documentation

## 2017-12-26 DIAGNOSIS — G7249 Other inflammatory and immune myopathies, not elsewhere classified: Secondary | ICD-10-CM | POA: Diagnosis not present

## 2017-12-26 DIAGNOSIS — Z7984 Long term (current) use of oral hypoglycemic drugs: Secondary | ICD-10-CM | POA: Diagnosis not present

## 2017-12-26 DIAGNOSIS — M109 Gout, unspecified: Secondary | ICD-10-CM | POA: Insufficient documentation

## 2017-12-26 DIAGNOSIS — Z7952 Long term (current) use of systemic steroids: Secondary | ICD-10-CM | POA: Insufficient documentation

## 2017-12-26 DIAGNOSIS — F17210 Nicotine dependence, cigarettes, uncomplicated: Secondary | ICD-10-CM | POA: Insufficient documentation

## 2017-12-26 DIAGNOSIS — I10 Essential (primary) hypertension: Secondary | ICD-10-CM

## 2017-12-26 DIAGNOSIS — Z590 Homelessness: Secondary | ICD-10-CM | POA: Insufficient documentation

## 2017-12-26 DIAGNOSIS — E1121 Type 2 diabetes mellitus with diabetic nephropathy: Secondary | ICD-10-CM | POA: Diagnosis not present

## 2017-12-26 DIAGNOSIS — N183 Chronic kidney disease, stage 3 unspecified: Secondary | ICD-10-CM

## 2017-12-26 DIAGNOSIS — Z9119 Patient's noncompliance with other medical treatment and regimen: Secondary | ICD-10-CM | POA: Insufficient documentation

## 2017-12-26 DIAGNOSIS — I5032 Chronic diastolic (congestive) heart failure: Secondary | ICD-10-CM | POA: Diagnosis not present

## 2017-12-26 DIAGNOSIS — Z79899 Other long term (current) drug therapy: Secondary | ICD-10-CM | POA: Insufficient documentation

## 2017-12-26 DIAGNOSIS — D860 Sarcoidosis of lung: Secondary | ICD-10-CM | POA: Insufficient documentation

## 2017-12-26 DIAGNOSIS — I272 Pulmonary hypertension, unspecified: Secondary | ICD-10-CM | POA: Diagnosis not present

## 2017-12-26 DIAGNOSIS — R195 Other fecal abnormalities: Secondary | ICD-10-CM

## 2017-12-26 NOTE — Addendum Note (Signed)
Encounter addended by: Marcy SirenBrennan, Slate Debroux S, LCSW on: 12/26/2017 3:29 PM  Actions taken: Sign clinical note

## 2017-12-26 NOTE — Progress Notes (Signed)
CSW referred to assist patient with insurance. Patient reports he completed a [facility] application and was approved for 100% coverage. He reports he has MD's outside of [facility] and is accruing bills which he will submit to DSS for a deductible medicaid application. CSW discussed unsure about having both coverage's but patient states he has followed up and was told it was possible. Patient appears to have followed up with options related to insurance. CSW provided information on Medicare coverage in the future. Patient verbalizes understanding. CSW available as needed. Lasandra BeechJackie Seaton Hofmann, LCSW, CCSW-MCS [phone]

## 2017-12-26 NOTE — Patient Instructions (Signed)
You have been referred to [facility], they will contact you with appointment information  Address: [address], [place], [place] [address] Phone: [phone]  Your physician recommends that you schedule a follow-up appointment in: 2 months with Dr Shirlee LatchMcLean   Do the following things EVERYDAY: 1) Weigh yourself in the morning before breakfast. Write it down and keep it in a log. 2) Take your medicines as prescribed 3) Eat low salt foods-Limit salt (sodium) to 2000 mg per day.  4) Stay as active as you can everyday 5) Limit all fluids for the day to less than 2 liters

## 2017-12-26 NOTE — Progress Notes (Signed)
Advanced Heart Failure Clinic Note   Primary Care: Joaquin Courts, FNP HF Cardiology: Dr. Shirlee Latch.   HPI: Hunter [name] is a 47 y.o. male  with a past medical history of sarcoidosis, chronic hypoxemic respiratory failure (OSA/OHS), and focal segmental glomerulosclerosis with nephrotic syndrome. FSGS has been treated in the past with steroids, but this has been complicated by compliance as he is homeless. Last chest CT was in 3/18 and was suggestive of stage IV pulmonary sarcoidosis.  PFTs in 1/18 showed severe restriction and severe obstruction. Echo (9/18) showed normal LV EF with D-shaped interventricular septum and RV failure.  Admitted 11/05/16-11/17/16 with acute right heart failure/volume overload. Diuresed 50 pounds with IV Lasix. RHC results below.   He has been followed by neurology and has had muscle biopsy.  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  Neurology recommended that he start prednisone but he has not done so yet.   Admitted 10/29 - 12/11/17 with hypertensive urgency. C/o R foot swelling, redness and pain. Treated with gout as outpatient previously. He clarifies that he had taken his last two torsemide the night before, and was due to go to the pharmacy for torsemide. He did not miss any dosing, nor was he completely out. Diuresed, treated for gout, and discharged with close follow up in place.   He presents today for post hospital follow up. No changes made at last visit, pt was feeling oddly on chronic prednisone. Feeling OK since recent hospitalization, but frustrated about his gout flare. No longer on chronic prednisone now with taper. His weight is up 21 lbs since last visit in 09/15/17 and finishing prednisone. He does not feel like he has extra fluid on. He remains SOB with walking short distances. Denies orthopnea or PND. Had dark and red stools last week, was worried it was related to lovenox in hospital. He was scheduled to follow with GI and to  get his health screening Colonoscopy, but says it just didn't work with his schedule. Remains on 3L of O2 and BiPAP at night. Wants to know if there are any medications he can stop.   Labs (11/18): BNP 161, K 4.2, creatinine 0.92 Labs (12/18): CK 670 Labs (3/19): K 4.7, creatinine 1.01  Past Medical History: 1. Sarcoidosis: Diagnosed by skin biopsy.  - PFTs (1/18): Severe obstruction and severe restriction.  - CT chest (2016, 9/18): Consistent with stage IV pulmonary sarcoidosis.  2. OHS/OSA: 3L home O2 during day, Bipap at night.  3. Chronic diastolic CHF/RV failure: Echo (9/18) with EF 55-60%, septal flattening, dilated RV with moderately decreased systolic function.  PA pressure estimation likely inaccurate.  - RHC (10/18): mean RA 6, PA 49/20 mean 30, mean PCWP 10, CI 2.32, PVR 3.6 WU.  4. HTN 5. Focal segmental glomerulosclerosis: With nephrotic syndrome.  Followed by Dr. Kathrene Bongo.  Has been treated with steroids and Prograf in the past.  6. Myopathy with proximal muscle weakness.  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  7. Type II diabetes.   Review of systems complete and found to be negative unless listed in HPI.    Current Outpatient Medications  Medication Sig Dispense Refill  . albuterol (PROVENTIL HFA;VENTOLIN HFA) 108 (90 Base) MCG/ACT inhaler Inhale 2 puffs into the lungs every 6 (six) hours as needed for wheezing or shortness of breath. 1 Inhaler 5  . amLODipine (NORVASC) 10 MG tablet Take 1 tablet (10 mg total) by mouth daily. 30 tablet 2  . ciclopirox (PENLAC) 8 %  solution APPLY TOPICALLY AT BEDTIME OVER NAIL AND SURROUNDING SKIN. APPLY DAILY OVER PREVIOUS COAT. AFTER 7 DAYS, MAY REMOVE WITH ALCOHOL AND CONTINU (Patient taking differently: Apply 1 application topically 3 (three) times daily. ) 6.6 mL 0  . diclofenac sodium (VOLTAREN) 1 % GEL APPLY 2 GRAMS TO FOOT (AND AFFECTED AREAS) 2-4 TIMES DAILY. 100 g 0  .  HYDROcodone-acetaminophen (NORCO/VICODIN) 5-325 MG tablet Take 1 tablet by mouth every 6 (six) hours as needed for moderate pain.    . magnesium oxide (MAG-OX) 400 (241.3 Mg) MG tablet Take 1 tablet (400 mg total) by mouth every evening. 90 tablet 2  . metFORMIN (GLUCOPHAGE) 500 MG tablet Take 1 tablet (500 mg total) by mouth 2 (two) times daily with a meal. 60 tablet 1  . nicotine (NICODERM CQ - DOSED IN MG/24 HR) 7 mg/24hr patch Place 1 patch (7 mg total) onto the skin daily. 30 patch 2  . potassium chloride SA (K-DUR,KLOR-CON) 20 MEQ tablet Take 1 tablet (20 mEq total) by mouth daily. 30 tablet 2  . torsemide (DEMADEX) 20 MG tablet Take 2 tablets (40 mg total) by mouth daily. 70 tablet 5  . traMADol (ULTRAM) 50 MG tablet Take 1 tablet (50 mg total) by mouth 2 (two) times daily as needed. (Patient taking differently: Take 50 mg by mouth 2 (two) times daily as needed for severe pain. ) 30 tablet 1   No current facility-administered medications for this encounter.     No Known Allergies  Social History   Socioeconomic History  . Marital status: Single    Spouse name: Not on file  . Number of children: 0  . Years of education: 312  . Highest education level: Not on file  Occupational History  . Occupation: Not employed  Social Needs  . Financial resource strain: Not on file  . Food insecurity:    Worry: Not on file    Inability: Not on file  . Transportation needs:    Medical: Not on file    Non-medical: Not on file  Tobacco Use  . Smoking status: Current Some Day Smoker    Packs/day: 0.10    Years: 10.00    Pack years: 1.00    Types: Cigarettes  . Smokeless tobacco: Never Used  . Tobacco comment: 2 cigarettes/day 08/05/17  Substance and Sexual Activity  . Alcohol use: Yes    Alcohol/week: 3.0 standard drinks    Types: 3 Cans of beer per week  . Drug use: No  . Sexual activity: Not on file  Lifestyle  . Physical activity:    Days per week: Not on file    Minutes per  session: Not on file  . Stress: Not on file  Relationships  . Social connections:    Talks on phone: Not on file    Gets together: Not on file    Attends religious service: Not on file    Active member of club or organization: Not on file    Attends meetings of clubs or organizations: Not on file    Relationship status: Not on file  . Intimate partner violence:    Fear of current or ex partner: Not on file    Emotionally abused: Not on file    Physically abused: Not on file    Forced sexual activity: Not on file  Other Topics Concern  . Not on file  Social History Narrative   Lives alone.  Currently homeless and staying on the 3rd floor of  a hotel with an elevator.     No children.  Education: high school.      Family History  Problem Relation Age of Onset  . Asthma Sister   . Hypertension Paternal Grandmother   . Colon cancer Neg Hx        Patient doesn't know for sure if his family history has colon, pancreatic, rectal cancer    Vitals:   12/26/17 0839  BP: (!) 146/88  Pulse: 88  SpO2: 96%  Weight: 132.2 kg (291 lb 6.4 oz)  PF: (!) 3 L/min    Wt Readings from Last 3 Encounters:  12/26/17 132.2 kg (291 lb 6.4 oz)  12/11/17 129.2 kg (284 lb 14.4 oz)  11/08/17 130.2 kg (287 lb)    PHYSICAL EXAM: General: Well appearing. No resp difficulty. HEENT: Normal Neck: Supple. JVP difficult, but does not appear elevated.  Carotids 2+ bilat; no bruits. No thyromegaly or nodule noted. Cor: PMI nondisplaced. RRR, No M/G/R noted Lungs: Diminished throughout. Abdomen: Obese, soft, non-tender, non-distended, no HSM. No bruits or masses. +BS  Extremities: No cyanosis, clubbing, or rash. R and LLE no edema.  Neuro: Alert & orientedx3, cranial nerves grossly intact. moves all 4 extremities w/o difficulty. Affect flat.   ASSESSMENT & PLAN:  1.OHS/OSA:  - He is on 3 L home oxygen and Bipap at night.   - No change. Follow up per primary.  2. Sarcoidosis:  - Stage IV pulmonary  sarcoidosis by CT chest (unchanged in 9/18 compared to 2016).  Sarcoidosis plays a role in hypoxemia as well. Additionally, he has an inflammatory myopathy with proximal muscle weakness most likely due to sarcoidosis.  - Has been on chronic prednisone intermittently. Continue follow up with pulmonary.  3. Chronic diastolic CHF/RV failure:  - RV dilated/dysfunctional on echo in 9/18 with D-shaped interventricular septum. After diuresis, filling pressures looked good on RHC 11/14/16.  On RHC, he had mild pulmonary hypertension with PVR 3.6 WU.  Possible component of group 5 PAH (from sarcoidosis) vs. Group 3 from OHS/OSA. PVR not particularly high and severe parenchymal disease from sarcoidosis, suspect that there is not going to be much benefit from treatment with selective pulmonary vasodilators.  - NYHA III symptoms chronically, Confounded by #s 1 and 2.  - Volume status stable on exam.  - Continue torsemide 20 mg bid. Recent labs (12/11/17) stable. Refuses labs today.  - Continue oxygen to limit hypoxic pulmonary vasoconstriction.  4. Focal segmental glomerulosclerosis with nephrotic syndrome: - Continue follow up with nephrology.  5. Myopathy:  - With proximal muscle weakness.  Muscle biopsy suggestive of inflammatory myopathy.  He has been off statin. CK mildly elevated.  Possible myopathy related to sarcoidosis.  - Finished long steroid taper per neuro. Somewhat improved.  6. HTN - Continue amlodipine 10 mg dialy.  - Refuses med titration today. Asks if there are any meds he can come off 7. Dark stools - Will refer to GI, and encouraged PCP follow up.  - Refuses labs today for CBC.  8. Non-compliance - With follow up and recommendations. Will have HFSW to see today to see if there are any barriers we can help him overcome.  - Needs insurance to improve compliance for specialty clinics such as HF and GI.   Refuses labs and med titration today. RTC 2 months with MD. Refer to GI.   Graciella Freer, PA-C 12/26/17   Greater than 50% of the 25 minute visit was spent in counseling/coordination of care  regarding disease state education, salt/fluid restriction, sliding scale diuretics, and medication compliance.

## 2017-12-27 MED FILL — ALBUTEROL SULFATE HFA 108 (: 108 (90 BAS | 26 days supply | Qty: 18 | Fill #1

## 2017-12-27 MED FILL — metFORMIN HCL 500 MG TABS: 500 | 30 days supply | Qty: 60 | Fill #1

## 2018-01-02 ENCOUNTER — Ambulatory Visit: Payer: Self-pay | Admitting: Registered"

## 2018-01-10 MED FILL — AMLODIPINE BESYLATE 10 MG T: 10 | 30 days supply | Qty: 30 | Fill #1

## 2018-01-10 MED FILL — POTASSIUM CL ER 20 MEQ TAB: 20 | 30 days supply | Qty: 30 | Fill #1

## 2018-01-11 MED FILL — TORSEMIDE 20 MG TABLET: 20 | 35 days supply | Qty: 70 | Fill #1

## 2018-01-16 ENCOUNTER — Encounter: Payer: Medicaid Other | Attending: Family Medicine | Admitting: Registered"

## 2018-01-16 ENCOUNTER — Encounter: Payer: Self-pay | Admitting: Registered"

## 2018-01-16 DIAGNOSIS — Z713 Dietary counseling and surveillance: Secondary | ICD-10-CM | POA: Insufficient documentation

## 2018-01-16 DIAGNOSIS — Z6838 Body mass index (BMI) 38.0-38.9, adult: Secondary | ICD-10-CM | POA: Insufficient documentation

## 2018-01-16 DIAGNOSIS — E669 Obesity, unspecified: Secondary | ICD-10-CM

## 2018-01-16 NOTE — Progress Notes (Signed)
Medical Nutrition Therapy:  Appt start time: 11:05 end time:  11:27.   Assessment:  Primary concerns today: Pt states he has recently gained weight from prednisone due to sarcoidosis; went from 244-288 lbs since starting medications less than 2 months.   Pt states he has left salt alone and has not been eating fried foods since previous visit. Pt states he has not had a taste for Congochinese food. Pt states he had cranberry juice and it was too sweet for him. Pt states he prefers water over everything.  Pt sates he has been been eating more snacks/fruit considering being unable to store food.   Pt expectations: blueprint of how to lose weight and burn fat, plan to reduce weight gain quickly  Pt states he has been sleeping with oxygen machine which has helped with resting. Pt states he has leg weakness and unable to climb steps.   Pt reports fluid restriction: no more than 2 L/day; pt states it is impossible to adhere to it. Pt states he relies on what [employer] provides for breakfast. Pt states he is on disability and works as Multimedia programmerdriver part-time. Pt states he typically works 4:30am-9am (5 hrs), 1pm-6pm (5 hrs), and 9 pm-11pm (2 hrs) about 7 days/week. Pt states he tries to sleep during his down time.   Pt states he likes to eat until he is full and does not want to waste food. Pt states he feels being bloated is a side effect of medications. Pt states he walks in shopping centers sometimes for physical activity. Pt states he will be able to prepare meals when he has his own place of living again; currently unable to keep food in places other than his car.    Preferred Learning Style:   No preference indicated   Learning Readiness:   Ready  Change in progress   MEDICATIONS: See list   DIETARY INTAKE:  Usual eating pattern includes 3 meals and 0-1 snacks per day.  Everyday foods include sub sandwich, fruit, and chinese food.  Avoided foods include soda, juice.    24-hr recall:  B  ( AM): 1 c coffee (10 oz) + 5-6 crackers + 3 slices of cheese  Snk ( AM): apple L ( PM): roast beef sandwich + lettuce + tomatoes  Snk ( PM): small bag of chips D ( PM): [employer] pizza-2 slices of pepperoni pizza  Snk ( PM): none Beverages: water, diluted juice, beer (12-pack/week)  Usual physical activity: walking 1 mi/day, 6 days/week  Estimated energy needs: 2000 calories 225 g carbohydrates 150 g protein 56 g fat  Progress Towards Goal(s):  In progress.   Nutritional Diagnosis:  NB-2.4 Impaired ability to prepare foods/meals As related to current living arrangements.  As evidenced by pt report of reliance on convenience foods and pre-prepared meals.    Intervention:  Nutrition education and counseling. Pt was  counseled on ways to balance meals, ways to incorporate some physical activity other than walking, and importance of adhering to fluid restrictions. Pt was in agreement with goals listed.  Goals: - Aim to have vegetables with lunch and dinner.  - Continue to stick with 2L/day fluid restriction.  - Keep up the great work with reducing fried food and salt intake.  Teaching Method Utilized:  Visual Auditory Hands on  Handouts given during visit include:  none  Barriers to learning/adherence to lifestyle change: financial constraints and limited ability to store and prepare meals due to housing  Demonstrated degree of understanding via:  Teach Back   Monitoring/Evaluation:  Dietary intake, exercise, and body weight in 1 month(s).

## 2018-01-16 NOTE — Patient Instructions (Signed)
-   Aim to have vegetables with lunch and dinner.   - Continue to stick with 2L/day fluid restriction.   - Keep up the great work with reducing fried food and salt intake.

## 2018-01-25 MED FILL — NICOTINE 7 MG/24HR PATCH: 7 | 28 days supply | Qty: 28 | Fill #1

## 2018-01-26 ENCOUNTER — Other Ambulatory Visit: Payer: Self-pay

## 2018-01-26 ENCOUNTER — Telehealth: Payer: Self-pay

## 2018-01-26 ENCOUNTER — Other Ambulatory Visit: Payer: Self-pay | Admitting: Family Medicine

## 2018-01-26 DIAGNOSIS — M79604 Pain in right leg: Secondary | ICD-10-CM

## 2018-01-26 DIAGNOSIS — R7303 Prediabetes: Secondary | ICD-10-CM

## 2018-01-26 DIAGNOSIS — I50812 Chronic right heart failure: Secondary | ICD-10-CM

## 2018-01-26 DIAGNOSIS — M79605 Pain in left leg: Principal | ICD-10-CM

## 2018-01-26 MED ORDER — ALBUTEROL SULFATE HFA 108 (90 BASE) MCG/ACT IN AERS: 2.0000 | INHALATION_SPRAY | Freq: Four times a day (QID) | RESPIRATORY_TRACT | 5 refills | Status: DC | PRN

## 2018-01-26 MED ORDER — METFORMIN HCL 500 MG PO TABS: 500.0000 mg | ORAL_TABLET | Freq: Two times a day (BID) | ORAL | 1 refills | Status: DC

## 2018-01-26 MED ORDER — TRAMADOL HCL 50 MG PO TABS: 50.0000 mg | ORAL_TABLET | Freq: Two times a day (BID) | ORAL | 0 refills | Status: DC | PRN

## 2018-01-26 MED ORDER — MAGNESIUM OXIDE 400 (241.3 MG) MG PO TABS: 400.0000 mg | ORAL_TABLET | Freq: Every evening | ORAL | 2 refills | Status: DC

## 2018-01-26 MED FILL — !VENTOLIN HFA INHALER: 108 (90 BAS | 25 days supply | Qty: 18 | Fill #0

## 2018-01-26 MED FILL — ?METFORMIN HCL 500MG TABLET: 500 | 30 days supply | Qty: 60 | Fill #0

## 2018-01-26 MED FILL — MAGNESIUM OXIDE 400 MG TAB: 400 | 30 days supply | Qty: 30 | Fill #0

## 2018-01-26 NOTE — Telephone Encounter (Signed)
Medication sent to pharmacy  

## 2018-01-26 NOTE — Telephone Encounter (Signed)
Patient needs a refill on Tramadol. Patient is aware that you are out of office.

## 2018-01-26 NOTE — Progress Notes (Signed)
Rx for Tramadol sent to pharmacy today.  

## 2018-01-26 NOTE — Telephone Encounter (Signed)
Patient notified

## 2018-01-30 ENCOUNTER — Telehealth: Payer: Self-pay

## 2018-01-30 DIAGNOSIS — M79605 Pain in left leg: Principal | ICD-10-CM

## 2018-01-30 DIAGNOSIS — M79604 Pain in right leg: Secondary | ICD-10-CM

## 2018-01-30 MED ORDER — TRAMADOL HCL 50 MG PO TABS: 50.0000 mg | ORAL_TABLET | Freq: Two times a day (BID) | ORAL | 0 refills | Status: DC | PRN

## 2018-01-30 MED FILL — traMADol HCL 50 MG TABS: 50 | 15 days supply | Qty: 30 | Fill #0

## 2018-01-30 NOTE — Telephone Encounter (Signed)
Patient needs Tramadol resent to pharmacy.

## 2018-02-06 ENCOUNTER — Other Ambulatory Visit: Payer: Self-pay | Admitting: Family Medicine

## 2018-02-06 ENCOUNTER — Telehealth: Payer: Self-pay

## 2018-02-06 DIAGNOSIS — M79605 Pain in left leg: Principal | ICD-10-CM

## 2018-02-06 DIAGNOSIS — M79604 Pain in right leg: Secondary | ICD-10-CM

## 2018-02-06 MED ORDER — TRAMADOL HCL 50 MG PO TABS: 50.0000 mg | ORAL_TABLET | Freq: Two times a day (BID) | ORAL | 0 refills | Status: AC | PRN

## 2018-02-06 NOTE — Telephone Encounter (Signed)
Patient state that he doesn't need a script at this time and that he would like refill on the Tramadol. Patient was advise that we don't do refill and to call when he needs it. Patient it aware that this is not a long term prescription. Patient would like to speak with you.

## 2018-02-06 NOTE — Telephone Encounter (Signed)
Patient needs a refill for Tramadol.

## 2018-02-07 MED FILL — POTASSIUM CL ER 20 MEQ TAB: 20 | 30 days supply | Qty: 30 | Fill #2

## 2018-02-07 MED FILL — AMLODIPINE BESYLATE 10 MG T: 10 | 30 days supply | Qty: 30 | Fill #2

## 2018-02-10 ENCOUNTER — Telehealth: Payer: Self-pay

## 2018-02-10 MED FILL — traMADol HCL 50 MG TABS: 50 | 15 days supply | Qty: 30 | Fill #0

## 2018-02-10 MED FILL — DICLOFENAC SODIUM 1% GEL: 1 | 13 days supply | Qty: 100 | Fill #0

## 2018-02-10 NOTE — Telephone Encounter (Signed)
Patient needs a script for 2 orders of compression socks.

## 2018-02-13 ENCOUNTER — Telehealth (HOSPITAL_COMMUNITY): Payer: Self-pay

## 2018-02-13 ENCOUNTER — Telehealth: Payer: Self-pay

## 2018-02-13 MED FILL — TORSEMIDE 20 MG TABLET: 20 | 35 days supply | Qty: 70 | Fill #2

## 2018-02-13 NOTE — Telephone Encounter (Signed)
I agree. He needs to see PCP for pain management.

## 2018-02-13 NOTE — Telephone Encounter (Signed)
Patient states that the Tramadol and the Tylenol is not working for pain. Patient wants to know if there is something else he can take or use over the counter that will not affect his kidneys.

## 2018-02-13 NOTE — Telephone Encounter (Signed)
Pt notified. Pt insisted on speaking with DM or [name]. Pt does not want to comply with no plans. I advised pt that DM is not here and [name] is inpatient.

## 2018-02-13 NOTE — Telephone Encounter (Signed)
Order for Compression Hose, #2 pair written today.Patient is aware.

## 2018-02-13 NOTE — Telephone Encounter (Signed)
Pt called stating his foot is in a lot of pain and swollen and the tramadol is not work. I advised pt to call his PCP or go to the ED considering his pain is a 9, per pt. I advised pt with OTC meds he can and cannot take. Pt does not want to comply with my suggestions. Please advise.

## 2018-02-13 NOTE — Telephone Encounter (Signed)
I spoke with patient directly. He took two doses of Aleve and is having some improvement in his foot pain. We discussed the reasons behind why we do not recommend NSAIDs. I recommended that he follow up with his PCP for further work up and management of foot pain. He voiced understanding and thanked me for the call.

## 2018-02-14 ENCOUNTER — Telehealth: Payer: Self-pay | Admitting: Emergency Medicine

## 2018-02-14 ENCOUNTER — Other Ambulatory Visit: Payer: Self-pay | Admitting: Emergency Medicine

## 2018-02-14 DIAGNOSIS — I50812 Chronic right heart failure: Secondary | ICD-10-CM

## 2018-02-14 MED FILL — PROAIR HFA 90 MCG INHALER: 108 (90 BAS | 25 days supply | Qty: 9 | Fill #0

## 2018-02-14 NOTE — Telephone Encounter (Signed)
Called and spoke with patient, he stated that he needed a refill on both the Albuterol inhaler as well as the prednisone. Advised patient that the inhaler Pro Air albuterol was sent in today by CMA Jaynee Eagles, he stated that was correct and he would pick it up. Patient is also requesting a refill of his prednisone. I do see where instructions on the last AVS stated that prednisone would be decreased however I do not see it now on his current med list.   RB please advise if this is ok to send in and at what strength. Thank you.

## 2018-02-15 MED ORDER — PREDNISONE 20 MG PO TABS: 20.0000 mg | ORAL_TABLET | Freq: Every day | ORAL | 0 refills | Status: DC

## 2018-02-15 MED FILL — predniSONE 20 MG TABS: 20 | 30 days supply | Qty: 30 | Fill #0

## 2018-02-15 NOTE — Telephone Encounter (Signed)
LMTCB.  Per RB clarify what dose of prednisone the patient is taking and refill that and make a office visit with RB to go over medications.

## 2018-02-15 NOTE — Telephone Encounter (Signed)
Patient states he is on 20mg . He is at the pharmacy waiting,

## 2018-02-15 NOTE — Telephone Encounter (Signed)
rx sent  Appt scheduled for 02/21/18 at 11:45 am  Pt given new office location info  Nothing further needed

## 2018-02-15 NOTE — Telephone Encounter (Signed)
I started tapering pred last visit - he was supposed to work down to 20mg  qd and then follow with me. He never followed up, not clear whether he decreased the pred as we had planned. Please find out what dose he is currently taking, refill that. Have him set up OV to see me and clarify this plan

## 2018-02-21 ENCOUNTER — Encounter: Payer: Self-pay | Admitting: Emergency Medicine

## 2018-02-21 ENCOUNTER — Ambulatory Visit (INDEPENDENT_AMBULATORY_CARE_PROVIDER_SITE_OTHER): Payer: Self-pay | Admitting: Emergency Medicine

## 2018-02-21 DIAGNOSIS — G4733 Obstructive sleep apnea (adult) (pediatric): Secondary | ICD-10-CM

## 2018-02-21 DIAGNOSIS — G7249 Other inflammatory and immune myopathies, not elsewhere classified: Secondary | ICD-10-CM

## 2018-02-21 DIAGNOSIS — D860 Sarcoidosis of lung: Secondary | ICD-10-CM

## 2018-02-21 MED ORDER — PREDNISONE 5 MG PO TABS: ORAL_TABLET | ORAL | 0 refills | Status: DC

## 2018-02-21 NOTE — Progress Notes (Addendum)
Subjective:    Patient ID: Hunter [name], male    DOB: 06/25/1970, 48 y.o.   MRN: [id number]  HPI 48 year old man with a history of biopsy-proven sarcoidosis, skin biopsy 2002, off steroids since '07.  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  He was admitted in September 2018 with right heart failure, significant volume overload and decompensated pulmonary hypertension.  He has had pulmonary function testing 02/12/16 that showed evidence for mixed obstruction and restriction on spirometry, significant restriction on lung volumes.  CT scan performed on 11/11/16 was reviewed by me.  This shows no evidence of mediastinal or hilar lymphadenopathy (no contrast), chronic right-sided pleural thickening with some associated calcification, trace right-sided pleural effusion, chronic areas of linear scarring bilaterally.  No evidence of groundglass, consolidation. He is chronically on 3L/min, was sent home from recent hospitalization with BiPAP, needs a titration study, currently on 15/12. His wt and edema have been stable.   He does have SOB with exertion, even w his O2 on. Has not used BD's  He is in the [employer], is staying at a hotel with their assistance. Medicaid is pending. They have also helped with securing his BiPAP.    ROV 02/14/17 --48 year old man with sarcoidosis, obesity, diastolic dysfunction chronic renal insufficiency.  He has probable obesity hypoventilation syndrome/OSA and we sent him for a split-night sleep study to titrate BiPAP.  Unfortunately he did not have enough events early in the evening to trigger BiPAP titration.  He is currently using 15/12. He feels that he is benefiting from the BiPAP, sleep is better, energy is better. We did a  trial of albuterol > he is unsure that he benefits. He feels side effects, some tremor. He usually wears 3L/min.    ROV 09/20/17 --48 year old male with history of sarcoidosis, obesity, diastolic dysfunction, chronic renal insufficiency (FSGS), obesity hypoventilation/OSA, secondary PAH.  He has been on BiPAP before, more recently titrated for CPAP apparently due to requirements to get coverage through his DME.  Stable chest imaging with most recent CT scan 11/11/2016 showing chronic right-sided pleural thickening and calcification some few scattered areas of chronic pleural-parenchymal interstitial disease without significant infiltrate, honeycombing, traction bronchiectasis, etc.  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  Prednisone was initiated and he has been on it since 6/28, dose recently increased to 40 mg daily given continued weakness and discomfort on 7/17. He can tell a difference on the higher dose prednisone.  He is on O2 at 3L/min.  He reports that he is experiencing less weakness, but still notices difficulty climbing stairs, heavier exertion.  He is using CPAP reliably every night.   ROV 11/01/17 --follow-up visit for 48 year old gentleman with a history of sarcoidosis, obesity, hypertension with diastolic dysfunction and chronic renal insufficiency (FSGS).  He is on positive pressure at night for obesity hypoventilation syndrome OSA with secondary pulmonary hypertension.  He has chronic interstitial disease on chest imaging.  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  We started him on corticosteroids in late June 2019, increased to 40 mg in mid July.  Since last time he had a BiPAP titration study that indicates optimal therapy at 12/8 +3 L/min  oxygen. He still has difficulty getting up from a seated position, has difficulty climbing stairs.  He has been using the BiPAP, feels that the IPAP may be a bit too low compared with his prior CPAP (15 cmH2O was too strong for him).   R OV 02/21/2018 --Hunter [name] is 20, has a history of sarcoidosis, obesity, hypertension with diastolic dysfunction, chronic renal insufficiency (FSGS).  He has chronic interstitial disease on chest imaging which is been overall stable.  He is also on BiPAP for OSA/obesity hypoventilation syndrome.  At my last visit I increased his IPAP to 14 (14/8+3 L/min oxygen).  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  He was treated with corticosteroids and I planned to begin tapering him in September. As he was tapering the pred, he developed R foot pain (? Gout), was admitted for this and for HTN. He was down to 20mg  pred at that time. His breathing is doing well. He notes good proximal muscle strength, he still has some difficulty w stairs, rising from a chair. He tells me that he is using his BiPAP every night reliably.   His current pred dose is 20mg   Addendum: I now have the patient's [employer] compliance download available from 12/15 to 02/20/2018.  He used the device 93% of the time, 90% for greater than 4 hours each evening.  His IPAP is still set at 13, EPAP at 8.  No significant leak, good control of his central and obstructive apneas.  R heart cath 11/11/16:  PAP 49/20 (30)  PAOP mean 10 PVR 3.6 Wood units Her cardiac output (Fick) 5.57  Review of Systems  Past Medical History:  Diagnosis Date  . Arthritis    feet  . CHF (congestive heart failure) (HCC)   . Dyspnea   . Hypertension   . Nephrotic syndrome   . Pre-diabetes   . Sarcoid   . Sarcoidosis   . Sleep apnea      Family History  Problem Relation Age of Onset  . Asthma Sister   . Hypertension Paternal Grandmother   . Colon cancer Neg Hx        Patient doesn't know for sure if his family history has colon, pancreatic, rectal cancer      Social History   Socioeconomic History  . Marital status: Single    Spouse name: Not on file  . Number of children: 0  . Years of education: 11  . Highest education level: Not on file  Occupational History  . Occupation: Not employed  Social Needs  . Financial resource strain: Not on file  . Food insecurity:    Worry: Not on file    Inability: Not on file  . Transportation needs:    Medical: Not on file    Non-medical: Not on file  Tobacco Use  . Smoking status: Current Some Day Smoker    Packs/day: 0.10    Years: 10.00    Pack years: 1.00    Types: Cigarettes  . Smokeless tobacco: Never Used  . Tobacco comment: 2 cigarettes/day 08/05/17  Substance and Sexual Activity  . Alcohol use: Yes    Alcohol/week: 3.0 standard drinks    Types: 3 Cans of beer per week  . Drug use: No  . Sexual activity: Not on file  Lifestyle  . Physical activity:    Days per week: Not on file    Minutes per session: Not on file  .  Stress: Not on file  Relationships  . Social connections:    Talks on phone: Not on file    Gets together: Not on file    Attends religious service: Not on file    Active member of club or organization: Not on file    Attends meetings of clubs or organizations: Not on file    Relationship status: Not on file  . Intimate partner violence:    Fear of current or ex partner: Not on file    Emotionally abused: Not on file    Physically abused: Not on file    Forced sexual activity: Not on file  Other Topics Concern  . Not on file  Social History Narrative   Lives alone.  Currently homeless and staying on the 3rd floor of a hotel with an elevator.     No children.  Education: high school.       No Known Allergies   Outpatient Medications Prior to Visit  Medication Sig Dispense Refill  . amLODipine (NORVASC) 10 MG tablet Take 1 tablet (10 mg total) by mouth daily. 30 tablet 2  . ciclopirox (PENLAC) 8 % solution APPLY TOPICALLY AT BEDTIME OVER NAIL AND  SURROUNDING SKIN. APPLY DAILY OVER PREVIOUS COAT. AFTER 7 DAYS, MAY REMOVE WITH ALCOHOL AND CONTINU (Patient taking differently: Apply 1 application topically 3 (three) times daily. ) 6.6 mL 0  . diclofenac sodium (VOLTAREN) 1 % GEL APPLY 2 GRAMS TO FOOT (AND AFFECTED AREAS) 2-4 TIMES DAILY. 100 g 0  . magnesium oxide (MAG-OX) 400 (241.3 Mg) MG tablet Take 1 tablet (400 mg total) by mouth every evening. 90 tablet 2  . metFORMIN (GLUCOPHAGE) 500 MG tablet Take 1 tablet (500 mg total) by mouth 2 (two) times daily with a meal. 60 tablet 1  . nicotine (NICODERM CQ - DOSED IN MG/24 HR) 7 mg/24hr patch Place 1 patch (7 mg total) onto the skin daily. 30 patch 2  . potassium chloride SA (K-DUR,KLOR-CON) 20 MEQ tablet Take 1 tablet (20 mEq total) by mouth daily. 30 tablet 2  . predniSONE (DELTASONE) 20 MG tablet Take 1 tablet (20 mg total) by mouth daily with breakfast. 30 tablet 0  . PROAIR HFA 108 (90 Base) MCG/ACT inhaler INHALE 2 PUFFS INTO THE LUNGS EVERY 6 HOURS AS NEEDED FOR WHEEZING OR SHORTNESS OF BREATH. 8.5 g 2  . torsemide (DEMADEX) 20 MG tablet Take 2 tablets (40 mg total) by mouth daily. 70 tablet 5  . traMADol (ULTRAM) 50 MG tablet Take 1 tablet (50 mg total) by mouth 2 (two) times daily as needed for severe pain. 30 tablet 0  . HYDROcodone-acetaminophen (NORCO/VICODIN) 5-325 MG tablet Take 1 tablet by mouth every 6 (six) hours as needed for moderate pain.     No facility-administered medications prior to visit.         Objective:   Physical Exam Vitals:   02/21/18 1156  BP: 120/82  Pulse: 83  SpO2: 96%  Weight: 300 lb (136.1 kg)  Height: 6' (1.829 m)   Gen: Pleasant, well-nourished, in no distress,  normal affect  ENT: No lesions,  mouth clear,  oropharynx clear, no postnasal drip  Neck: No JVD, no stridor  Lungs: No use of accessory muscles, small lung volumes, no wheeze or crackles.   Cardiovascular: RRR, heart sounds normal, no murmur or gallops, no peripheral  edema  Musculoskeletal: No deformities, no cyanosis or clubbing   Neuro: alert, good strength B LE's, 5/5  Skin: Warm, no  lesions or rashes     Assessment & Plan:  Inflammatory myopathy I try to start tapering his prednisone after his last visit as is not clear to me that the inflammatory myopathy remains active.  He did not necessarily develop any worsening weakness but he was admitted to the hospital with right great toe pain, elevated uric acid as well as hypertension.  This may have been gout but I am not sure that is been confirmed.  Unclear as to whether tapering the prednisone precipitated this.  He is currently on prednisone 20 mg and I think he needs to be tapered off if at all possible I will go ahead and decrease to 10 mg daily for 3 weeks then to 5 mg daily.  I will see him back and if he is tolerating then we will wean to off.  Sarcoidosis of lung (HCC) He had an overall stable CT scan of the chest 12/06/2017 and I do not believe he needs to be on prednisone therapy for pulmonary sarcoid  OSA (obstructive sleep apnea) Continue BiPAP at current settings every night.  He states that he has good compliance and has good clinical benefit.  I was not able to download his most recent usage through [employer] today.  Hunter [name] Antionette Luster, MD, PhD 02/21/2018, 1:39 PM [facility] [phone] or if no answer [phone]

## 2018-02-21 NOTE — Patient Instructions (Addendum)
Please continue your BiPAP every night as you have been using it. We will taper your prednisone.  Decrease to 10 mg daily now and take this dose for 3 weeks.  If you tolerate without any difficulty in breathing or changes in pain, weakness, then we will decrease at that time to 5 mg daily.  Stay on 5 mg daily until next visit Follow with Dr. Delton Coombes in 2 months or sooner if you have any problems.

## 2018-02-21 NOTE — Assessment & Plan Note (Signed)
He had an overall stable CT scan of the chest 12/06/2017 and I do not believe he needs to be on prednisone therapy for pulmonary sarcoid

## 2018-02-21 NOTE — Assessment & Plan Note (Signed)
I try to start tapering his prednisone after his last visit as is not clear to me that the inflammatory myopathy remains active.  He did not necessarily develop any worsening weakness but he was admitted to the hospital with right great toe pain, elevated uric acid as well as hypertension.  This may have been gout but I am not sure that is been confirmed.  Unclear as to whether tapering the prednisone precipitated this.  He is currently on prednisone 20 mg and I think he needs to be tapered off if at all possible I will go ahead and decrease to 10 mg daily for 3 weeks then to 5 mg daily.  I will see him back and if he is tolerating then we will wean to off.

## 2018-02-21 NOTE — Assessment & Plan Note (Addendum)
Continue BiPAP at current settings every night.  He states that he has good compliance and has good clinical benefit.  I was not able to download his most recent usage through [employer] today.  Addendum: I now have the patient's [employer] compliance download available from 12/15 to 02/20/2018.  He used the device 93% of the time, 90% for greater than 4 hours each evening.  His IPAP is still set at 13, EPAP at 8.  No significant leak, good control of his central and obstructive apneas.

## 2018-02-22 ENCOUNTER — Ambulatory Visit: Payer: Self-pay | Admitting: Registered"

## 2018-02-27 ENCOUNTER — Encounter: Payer: Self-pay | Admitting: Student

## 2018-03-02 MED FILL — ?METFORMIN HCL 500MG TABLET: 500 | 30 days supply | Qty: 60 | Fill #1

## 2018-03-02 MED FILL — !VENTOLIN HFA INHALER: 108 (90 BAS | 25 days supply | Qty: 18 | Fill #1

## 2018-03-02 MED FILL — predniSONE 5 MG TABS: 5 | 60 days supply | Qty: 80 | Fill #0

## 2018-03-03 ENCOUNTER — Other Ambulatory Visit: Payer: Self-pay

## 2018-03-03 MED ORDER — AMLODIPINE BESYLATE 10 MG PO TABS: 10.0000 mg | ORAL_TABLET | Freq: Every day | ORAL | 2 refills | Status: DC

## 2018-03-03 NOTE — Telephone Encounter (Signed)
Medication sent.

## 2018-03-06 MED FILL — AMLODIPINE BESYLATE 10 MG T: 10 | 30 days supply | Qty: 30 | Fill #0

## 2018-03-07 ENCOUNTER — Ambulatory Visit: Payer: Self-pay | Admitting: Gastroenterology

## 2018-03-13 MED FILL — MAGNESIUM OXIDE 400 MG TAB: 400 | 30 days supply | Qty: 30 | Fill #1

## 2018-03-13 MED FILL — NICOTINE 7 MG/24HR PATCH: 7 | 28 days supply | Qty: 28 | Fill #2

## 2018-03-14 ENCOUNTER — Encounter (HOSPITAL_COMMUNITY): Payer: Self-pay | Admitting: Cardiology

## 2018-03-16 ENCOUNTER — Telehealth: Payer: Self-pay

## 2018-03-16 NOTE — Telephone Encounter (Signed)
Patient was prescribed Potassium in the hospital and needs refills. Is this okay to do

## 2018-03-16 NOTE — Telephone Encounter (Signed)
Patient advise to come in for appointment with provider to review the medication to see if it's still needed. He is schedule in April but declined to come in sooner to address this matter.

## 2018-03-22 MED FILL — PROAIR HFA 90 MCG INHALER: 108 (90 BAS | 25 days supply | Qty: 9 | Fill #1

## 2018-03-22 MED FILL — TORSEMIDE 20 MG TABLET: 20 | 35 days supply | Qty: 70 | Fill #3

## 2018-04-03 ENCOUNTER — Other Ambulatory Visit: Payer: Self-pay | Admitting: Family Medicine

## 2018-04-03 DIAGNOSIS — R7303 Prediabetes: Secondary | ICD-10-CM

## 2018-04-05 ENCOUNTER — Other Ambulatory Visit: Payer: Self-pay

## 2018-04-05 DIAGNOSIS — R7303 Prediabetes: Secondary | ICD-10-CM

## 2018-04-05 MED ORDER — METFORMIN HCL 500 MG PO TABS: 500.0000 mg | ORAL_TABLET | Freq: Two times a day (BID) | ORAL | 1 refills | Status: DC

## 2018-04-05 MED FILL — metFORMIN HCL 500 MG TABS: 500 | 30 days supply | Qty: 60 | Fill #0

## 2018-04-05 NOTE — Telephone Encounter (Signed)
Medication sent to pharmacy  

## 2018-04-18 MED FILL — PROAIR HFA 90 MCG INHALER: 108 (90 BAS | 25 days supply | Qty: 9 | Fill #2

## 2018-04-24 ENCOUNTER — Other Ambulatory Visit: Payer: Self-pay

## 2018-04-24 ENCOUNTER — Encounter: Payer: Self-pay | Admitting: Emergency Medicine

## 2018-04-24 ENCOUNTER — Ambulatory Visit (INDEPENDENT_AMBULATORY_CARE_PROVIDER_SITE_OTHER): Payer: Self-pay | Admitting: Emergency Medicine

## 2018-04-24 VITALS — BP 166/110 | HR 85 | Ht 72.0 in | Wt 307.0 lb

## 2018-04-24 DIAGNOSIS — D860 Sarcoidosis of lung: Secondary | ICD-10-CM

## 2018-04-24 DIAGNOSIS — F17200 Nicotine dependence, unspecified, uncomplicated: Secondary | ICD-10-CM

## 2018-04-24 DIAGNOSIS — G4733 Obstructive sleep apnea (adult) (pediatric): Secondary | ICD-10-CM

## 2018-04-24 DIAGNOSIS — I10 Essential (primary) hypertension: Secondary | ICD-10-CM

## 2018-04-24 LAB — MAGNESIUM: MAGNESIUM: 2.1 mg/dL (ref 1.5–2.5)

## 2018-04-24 LAB — POTASSIUM: Potassium: 4.2 mEq/L (ref 3.5–5.1)

## 2018-04-24 MED ORDER — AMLODIPINE BESYLATE 10 MG PO TABS: 10.0000 mg | ORAL_TABLET | Freq: Every day | ORAL | 0 refills | Status: DC

## 2018-04-24 MED ORDER — NICOTINE 7 MG/24HR TD PT24: 7.0000 mg | MEDICATED_PATCH | TRANSDERMAL | 2 refills | Status: DC

## 2018-04-24 NOTE — Patient Instructions (Signed)
Please continue to wear your BiPAP every night as you have been doing it. Continue your oxygen at all times as you have been wearing it. We will refill your amlodipine for 1 month.  Please take once daily until you are able to follow-up with Jolene Schimke, NP, on 05/10/2018.  At that time you can decide whether you get a stay on this medication long-term. We will perform lab work today to help you determine whether you and  N. Stroud decide whether you need to remain on potassium and magnesium supplements Please start taking your prednisone 5 mg every other day for the next week.  If you continue to feel the same after that time then stop the prednisone altogether.  Call our office if you develop any new symptoms including shortness of breath, weakness, fatigue. We will plan to repeat your pulmonary function testing and CT scan of the chest in the future once you are off the prednisone to assess the status of your pulmonary sarcoidosis. Follow with Dr. Delton Coombes in 2 months or sooner if you have any problems.

## 2018-04-24 NOTE — Assessment & Plan Note (Signed)
He is hypertensive here today.  He apparently ran out of his amlodipine, needs to follow-up with his PCP in order to determine whether to continue this long-term.  He also is interested in finding out if he needs to stay on his magnesium and potassium supplementation.  I will refill the amlodipine until he can see his PCP.  They can decide whether to continue him on it.  I will check blood work today to assess his potassium magnesium levels so they can decide whether to continue his supplementation.

## 2018-04-24 NOTE — Assessment & Plan Note (Signed)
Continue your oxygen at all times as you have been wearing it.

## 2018-04-24 NOTE — Assessment & Plan Note (Signed)
Please continue to wear your BiPAP every night as you have been doing it.

## 2018-04-24 NOTE — Assessment & Plan Note (Signed)
If difficult to gauge his symptoms because he has multifactorial dyspnea.  He appears to have tolerated the decrease in his prednisone to 5 mg daily.  I will try to wean him to off as has been his goal.  We will check his clinical status, PFT, CT chest once he is off prednisone and determine whether we are stable to stay off.

## 2018-04-24 NOTE — Progress Notes (Signed)
Subjective:    Patient ID: Hunter [name], male    DOB: 1970/03/17, 48 y.o.   MRN: [id number]  HPI  ROV 02/21/2018 --Hunter [name] is 48, has a history of sarcoidosis, obesity, hypertension with diastolic dysfunction, chronic renal insufficiency (FSGS).  He has chronic interstitial disease on chest imaging which is been overall stable.  He is also on BiPAP for OSA/obesity hypoventilation syndrome.  At my last visit I increased his IPAP to 14 (14/8+3 L/min oxygen).  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  He was treated with corticosteroids and I planned to begin tapering him in September. As he was tapering the pred, he developed R foot pain (? Gout), was admitted for this and for HTN. He was down to 20mg  pred at that time. His breathing is doing well. He notes good proximal muscle strength, he still has some difficulty w stairs, rising from a chair. He tells me that he is using his BiPAP every night reliably.   His current pred dose is 20mg   Addendum: I now have the patient's [employer] compliance download available from 12/15 to 02/20/2018.  He used the device 93% of the time, 90% for greater than 4 hours each evening.  His IPAP is still set at 13, EPAP at 8.  No significant leak, good control of his central and obstructive apneas.  ROV 04/24/2018 --this is a follow-up visit for patient with a history of sarcoidosis, hypertension, obesity, OSA on BiPAP.  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  I continue to taper his prednisone at his last visit in January from prednisone 20, he is now on prednisone 5mg . He hasn't really noticed any worsening of sx as the pred has been tapered - . He is dealing with nasal congestion, more dyspnea. He is out of amlodipine, is hypertensive today, is supposed to follow with his PCP. He is also wondering about whether he needs to stay on K, Mg supplementation.   He continues to use his BiPAP reliably. Uses albuterol about 2-3x a day.    Plan repeat PFT and Ct chest once off the pred.   He still has some muscle weakness, especially when climbing stairs. 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   R heart cath 11/11/16:  PAP 49/20 (30)  PAOP mean 10 PVR 3.6 Wood units Her cardiac output (Fick) 5.57  Review of Systems      Objective:   Physical Exam Vitals:   04/24/18 1137  BP: (!) 166/110  Pulse: 85  SpO2: 95%  Weight: (!) 307 lb (139.3 kg)  Height: 6' (1.829 m)   Gen: Pleasant, well-nourished, in no distress,  normal affect  ENT: No lesions,  mouth clear,  oropharynx clear, no postnasal drip  Neck: No JVD, no stridor  Lungs: No use of accessory muscles, small lung volumes, no wheeze or crackles.   Cardiovascular: RRR, heart sounds normal, no murmur or gallops, no peripheral edema  Musculoskeletal: No deformities, no cyanosis or clubbing   Neuro: alert, good strength B LE's, 5/5  Skin: Warm, no lesions or rashes     Assessment & Plan:  OSA (obstructive sleep apnea) Please continue to wear your BiPAP every night as you have been doing it.   Obesity hypoventilation syndrome (HCC) Continue your oxygen at all times as you have been wearing it.   HTN (hypertension) He is hypertensive here today.  He apparently ran out of his amlodipine, needs to follow-up with his PCP in order to determine whether to continue this long-term.  He also is interested in finding out if he needs to stay on his magnesium and potassium supplementation.  I will refill the amlodipine until he can see his PCP.  They can decide whether to continue him on it.  I will check blood work today to assess his potassium magnesium levels so they can decide whether to continue his supplementation.  Sarcoidosis of lung (HCC) If difficult to gauge his symptoms because he has multifactorial dyspnea.  He appears to have tolerated the decrease in his  prednisone to 5 mg daily.  I will try to wean him to off as has been his goal.  We will check his clinical status, PFT, CT chest once he is off prednisone and determine whether we are stable to stay off.  Levy Pupa, MD, PhD 04/24/2018, 12:14 PM [facility] [phone] or if no answer [phone]

## 2018-04-24 NOTE — Addendum Note (Signed)
Addended by: Demetrio Lapping E on: 04/24/2018 12:16 PM   Modules accepted: Orders

## 2018-04-24 NOTE — Telephone Encounter (Signed)
Medication sent to pharmacy  

## 2018-04-25 MED FILL — NICOTINE 7 MG/24HR PATCH: 7 | 28 days supply | Qty: 28 | Fill #0

## 2018-04-25 MED FILL — AMLODIPINE BESYLATE 10 MG T: 10 | 30 days supply | Qty: 30 | Fill #0

## 2018-05-01 MED FILL — TORSEMIDE 20 MG TABLET: 20 | 35 days supply | Qty: 70 | Fill #4

## 2018-05-01 MED FILL — metFORMIN HCL 500 MG TABS: 500 | 30 days supply | Qty: 60 | Fill #1

## 2018-05-01 NOTE — Progress Notes (Signed)
Heart Failure TeleHealth Note  Due to national recommendations of social distancing due to COVID 19, Audio/video telehealth visit is felt to be most appropriate for this patient at this time.  See MyChart message from today for patient consent regarding telehealth for [name].  Date:  05/02/2018   ID:  Hunter [name], DOB 1970-02-28, MRN [id number]  Location: Home  Provider location: [address] [place] Type of Visit: Acute visit for weight gain  PCP:  Kallie Locks, FNP  Cardiologist:  Marca Ancona, MD Primary HF: Dr Shirlee Latch Pulmonary: Dr Delton Coombes    Chief Complaint: Heart Failure   History of Present Illness: Hunter [name] is a 48 y.o. male who presents via audio/video conferencing for a telehealth visit today.     Mr Tawanna Cooler has a history of sarcoidosis, chronic hypoxemic respiratory failure (OSA/OHS), gout,  HTN, and focal segmental glomerulosclerosis with nephrotic syndrome. FSGS has been treated in the past with steroids, but this has been complicated by compliance. Last chest CT was in 3/18 and was suggestive of stage IV pulmonary sarcoidosis. PFTs in 1/18 showed severe restriction and severe obstruction. Echo (9/18) showed normal LV EF with D-shaped interventricular septum and RV failure.  Today, he denies symptoms of palpitations, chest pain, lower extremity edema, claudication, dizziness, presyncope, syncope, or bleeding. SOB with exertion but this is his baseline. He reports using 3 liters oxygen daily. Using CPAP every night.  Weight at home has been trending up to 307 pounds. He reports a huge appetite but he thinks weight gain is due to prednisone.  He has been taking ultram as needed for foot pain.  The patient is tolerating medications without difficulties and is otherwise without complaint today.     He  denies symptoms of cough, fevers, chills, or new SOB worrisome for COVID 19.  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   Past Medical  History:  Diagnosis Date  . Arthritis    feet  . CHF (congestive heart failure) (HCC)   . Dyspnea   . Hypertension   . Nephrotic syndrome   . Pre-diabetes   . Sarcoid   . Sarcoidosis   . Sleep apnea    Past Surgical History:  Procedure Laterality Date  . MUSCLE BIOPSY Right 05/03/2017   Procedure: RIGHT THIGH MUSCLE BIOPSY/RIGHT DELTOID MUSCLE BIOPSY;  Surgeon: Manus Rudd, MD;  Location: [facility] OR;  Service: General;  Laterality: Right;  . RIGHT HEART CATH N/A 11/11/2016   Procedure: RIGHT HEART CATH;  Surgeon: Laurey Morale, MD;  [facility] INVASIVE CV LAB;  Service: Cardiovascular;  Laterality: N/A;     Current Outpatient Medications  Medication Sig Dispense Refill  . amLODipine (NORVASC) 10 MG tablet Take 1 tablet (10 mg total) by mouth daily. 30 tablet 0  . diclofenac sodium (VOLTAREN) 1 % GEL APPLY 2 GRAMS TO FOOT (AND AFFECTED AREAS) 2-4 TIMES DAILY. 100 g 0  . nicotine (NICODERM CQ - DOSED IN MG/24 HR) 7 mg/24hr patch Place 1 patch (7 mg total) onto the skin daily. 28 patch 2  . predniSONE (DELTASONE) 5 MG tablet Take 10mg  daily for 3 weeks, then 5mg  daily until OV in 2 months (Patient taking differently: Take 5 mg by mouth every other day. ) 80 tablet 0  . PROAIR HFA 108 (90 Base) MCG/ACT inhaler INHALE 2 PUFFS INTO THE LUNGS EVERY 6 HOURS AS NEEDED FOR WHEEZING OR SHORTNESS OF BREATH. 8.5 g 2  .  torsemide (DEMADEX) 20 MG tablet Take 2 tablets (40 mg total) by mouth daily. 70 tablet 5  . traMADol (ULTRAM) 50 MG tablet Take 1 tablet (50 mg total) by mouth 2 (two) times daily as needed for severe pain. 30 tablet 0  . ciclopirox (PENLAC) 8 % solution APPLY TOPICALLY AT BEDTIME OVER NAIL AND SURROUNDING SKIN. APPLY DAILY OVER PREVIOUS COAT. AFTER 7 DAYS, MAY REMOVE WITH ALCOHOL AND CONTINU (Patient not taking: No sig reported) 6.6 mL 0  . metFORMIN (GLUCOPHAGE) 500 MG tablet Take 1 tablet (500 mg total) by mouth 2 (two) times daily with a meal. (Patient not taking: Reported on  05/02/2018) 60 tablet 1   No current facility-administered medications for this encounter.     Allergies:   Patient has no known allergies.   Social History:  The patient  reports that he has been smoking cigarettes. He has a 1.00 pack-year smoking history. He has never used smokeless tobacco. He reports current alcohol use of about 3.0 standard drinks of alcohol per week. He reports that he does not use drugs.   Family History:  The patient's family history includes Asthma in his sister; Hypertension in his paternal grandmother.   ROS:  Please see the history of present illness.   All other systems are personally reviewed and negative.   Exam:  (Video/Tele Health Call; Exam is subjective and or/visual.) General:  No respiratory difficulty  HEENT: Normal Neck: non tender reported  Cor: reports regular pulse  Lungs: Normal respiratory effort with conversation.  Abdomen: Non-distended. Pt denies tenderness with self palpation.  Extremities: Pt denies edema. Neuro: Alert & oriented x 3.   Recent Labs: 12/06/2017: ALT 26; B Natriuretic Peptide 72.6 12/11/2017: BUN 56; Creatinine, Ser 1.12; Hemoglobin 14.8; Platelets 191; Sodium 141 04/24/2018: Magnesium 2.1; Potassium 4.2  Personally reviewed   Wt Readings from Last 3 Encounters:  05/02/18 (!) 139.3 kg (307 lb)  04/24/18 (!) 139.3 kg (307 lb)  02/21/18 136.1 kg (300 lb)      Other studies personally reviewed: Additional studies/ records that were reviewed today include:  ASSESSMENT AND PLAN:  1. Chronic Diastolic HF/RV Failure  NYHA III, chronically.  Volume status sounds stable.  Continue torsemide 40 mg daily. He is currently off potassium.  I have asked him to take extra 20 mg torsemide +20 meq of potassium for lower extremity edema.  Discussed limiting fluid intake and following low salt diet.   2. OHS/OSA On chronic 3 liters oxygen. Continue Bipap  He has follow up with D Byrum.   3. Obesity  Body mass index is 41.64  kg/m. Discussed portion control.    COVID screen The patient does not have any symptoms that suggest any further testing/ screening at this time.  Social distancing reinforced today.  Recommended follow-up:  3-4 months   Relevant cardiac medications were reviewed at length with the patient today.   The patient does not have concerns regarding their medications at this time.   The following changes were made today: Discussed sliding scale diuretics.  I have asked him to take extra 20 mg torsemide +20 meq of potassium for lower extremity edema. Today we will refill torsemide, amlodipine, and potassium.   Labs/ tests ordered today include: None   Patient Risk: After full review of this patients clinical status, I feel that he is  at moderate risk for cardiac decompensation at this time.  Today, I have spent 28 minutes with the patient with telehealth technology discussing eart failure,  limiting fluid intake  .    Waneta Martins, NP  05/02/2018 10:21 AM  [facility] [address] [facility] [place] [address] [phone] (office) [phone] (fax)

## 2018-05-02 ENCOUNTER — Ambulatory Visit (HOSPITAL_COMMUNITY)
Admission: RE | Admit: 2018-05-02 | Discharge: 2018-05-02 | Disposition: A | Discharge status: Home / Self Care | Payer: MEDICAID | Source: Ambulatory Visit | Attending: Cardiology | Admitting: Cardiology

## 2018-05-02 ENCOUNTER — Other Ambulatory Visit: Payer: Self-pay

## 2018-05-02 VITALS — Wt 307.0 lb

## 2018-05-02 DIAGNOSIS — G4733 Obstructive sleep apnea (adult) (pediatric): Secondary | ICD-10-CM

## 2018-05-02 DIAGNOSIS — I50810 Right heart failure, unspecified: Secondary | ICD-10-CM

## 2018-05-02 DIAGNOSIS — I5032 Chronic diastolic (congestive) heart failure: Secondary | ICD-10-CM

## 2018-05-02 MED ORDER — AMLODIPINE BESYLATE 10 MG PO TABS: 10.0000 mg | ORAL_TABLET | Freq: Every day | ORAL | 3 refills | Status: AC

## 2018-05-02 MED ORDER — POTASSIUM CHLORIDE CRYS ER 20 MEQ PO TBCR: 20.0000 meq | EXTENDED_RELEASE_TABLET | Freq: Every day | ORAL | 2 refills | Status: AC

## 2018-05-02 MED ORDER — TORSEMIDE 20 MG PO TABS: 40.0000 mg | ORAL_TABLET | Freq: Every day | ORAL | 5 refills | Status: AC

## 2018-05-02 NOTE — Addendum Note (Signed)
Encounter addended by: Modesta Messing, CMA on: 05/02/2018 11:31 AM  Actions taken: Pharmacy for encounter modified, Order list changed, Clinical Note Signed

## 2018-05-02 NOTE — Patient Instructions (Signed)
Take extra 20 mg torsemide +20 meq of potassium for lower extremity edema.  Please call our office in 3-4 months to schedule follow up appointment.

## 2018-05-02 NOTE — Addendum Note (Signed)
Encounter addended by: Sherald Hess, NP on: 05/02/2018 10:47 AM  Actions taken: Charge Capture section accepted

## 2018-05-02 NOTE — Progress Notes (Signed)
I call patient and sent refills to pharmacy, discussed med changes (pt verbalized understanding). AVS mailed to patient.

## 2018-05-09 ENCOUNTER — Other Ambulatory Visit: Payer: Self-pay | Admitting: Family Medicine

## 2018-05-09 DIAGNOSIS — R7303 Prediabetes: Secondary | ICD-10-CM

## 2018-05-09 MED FILL — POTASSIUM CL ER 20 MEQ TAB: 20 | 90 days supply | Qty: 90 | Fill #0

## 2018-05-09 MED FILL — AMLODIPINE BESYLATE 10 MG T: 10 | 90 days supply | Qty: 90 | Fill #0

## 2018-05-09 MED FILL — TORSEMIDE 20 MG TABLET: 20 | 90 days supply | Qty: 180 | Fill #0

## 2018-05-10 ENCOUNTER — Encounter: Payer: Self-pay | Admitting: Family Medicine

## 2018-05-10 ENCOUNTER — Other Ambulatory Visit: Payer: Self-pay

## 2018-05-10 ENCOUNTER — Ambulatory Visit (INDEPENDENT_AMBULATORY_CARE_PROVIDER_SITE_OTHER): Payer: Medicaid Other | Admitting: Family Medicine

## 2018-05-10 DIAGNOSIS — I10 Essential (primary) hypertension: Secondary | ICD-10-CM

## 2018-05-10 DIAGNOSIS — J302 Other seasonal allergic rhinitis: Secondary | ICD-10-CM | POA: Diagnosis not present

## 2018-05-10 DIAGNOSIS — Z6838 Body mass index (BMI) 38.0-38.9, adult: Secondary | ICD-10-CM

## 2018-05-10 DIAGNOSIS — R7303 Prediabetes: Secondary | ICD-10-CM

## 2018-05-10 DIAGNOSIS — F17200 Nicotine dependence, unspecified, uncomplicated: Secondary | ICD-10-CM

## 2018-05-10 DIAGNOSIS — E66812 Obesity, class 2: Secondary | ICD-10-CM

## 2018-05-10 DIAGNOSIS — Z09 Encounter for follow-up examination after completed treatment for conditions other than malignant neoplasm: Secondary | ICD-10-CM

## 2018-05-10 DIAGNOSIS — I50812 Chronic right heart failure: Secondary | ICD-10-CM | POA: Diagnosis not present

## 2018-05-10 DIAGNOSIS — Z720 Tobacco use: Secondary | ICD-10-CM

## 2018-05-10 MED ORDER — FEXOFENADINE HCL 60 MG PO TABS: 60.0000 mg | ORAL_TABLET | Freq: Two times a day (BID) | ORAL | 3 refills | Status: DC

## 2018-05-10 MED ORDER — METFORMIN HCL 500 MG PO TABS: 500.0000 mg | ORAL_TABLET | Freq: Two times a day (BID) | ORAL | 2 refills | Status: DC

## 2018-05-10 MED ORDER — FLUTICASONE PROPIONATE 50 MCG/ACT NA SUSP: 2.0000 | Freq: Every day | NASAL | 6 refills | Status: AC

## 2018-05-10 MED FILL — FLUTICASONE PROP 50 MCG SPR: 50 | 30 days supply | Qty: 16 | Fill #0

## 2018-05-10 NOTE — Progress Notes (Signed)
Virtual Visit via Telephone Note  I connected with Hunter [name] on 05/10/18 at 11:00 AM EDT by telephone and verified that I am speaking with the correct person using two identifiers.   I discussed the limitations, risks, security and privacy concerns of performing an evaluation and management service by telephone and the availability of in person appointments. I also discussed with the patient that there may be a patient responsible charge related to this service. The patient expressed understanding and agreed to proceed.   History of Present Illness:  Past Medical History:  Diagnosis Date  . Arthritis    feet  . CHF (congestive heart failure) (HCC)   . Dyspnea   . Hypertension   . Nephrotic syndrome   . Pre-diabetes   . Sarcoid   . Sarcoidosis   . Sleep apnea     Current Outpatient Medications on File Prior to Visit  Medication Sig Dispense Refill  . amLODipine (NORVASC) 10 MG tablet Take 1 tablet (10 mg total) by mouth daily. 30 tablet 3  . diclofenac sodium (VOLTAREN) 1 % GEL APPLY 2 GRAMS TO FOOT (AND AFFECTED AREAS) 2-4 TIMES DAILY. 100 g 0  . naproxen sodium (ALEVE) 220 MG tablet Take 220 mg by mouth daily as needed.    . nicotine (NICODERM CQ - DOSED IN MG/24 HR) 7 mg/24hr patch Place 1 patch (7 mg total) onto the skin daily. 28 patch 2  . potassium chloride SA (K-DUR,KLOR-CON) 20 MEQ tablet Take 1 tablet (20 mEq total) by mouth daily. 45 tablet 2  . predniSONE (DELTASONE) 5 MG tablet Take 10mg  daily for 3 weeks, then 5mg  daily until OV in 2 months (Patient taking differently: Take 5 mg by mouth every other day. ) 80 tablet 0  . PROAIR HFA 108 (90 Base) MCG/ACT inhaler INHALE 2 PUFFS INTO THE LUNGS EVERY 6 HOURS AS NEEDED FOR WHEEZING OR SHORTNESS OF BREATH. 8.5 g 2  . torsemide (DEMADEX) 20 MG tablet Take 2 tablets (40 mg total) by mouth daily. 70 tablet 5  . traMADol (ULTRAM) 50 MG tablet Take 1 tablet (50 mg total) by mouth 2 (two) times daily as needed for severe pain.  30 tablet 0  . ciclopirox (PENLAC) 8 % solution APPLY TOPICALLY AT BEDTIME OVER NAIL AND SURROUNDING SKIN. APPLY DAILY OVER PREVIOUS COAT. AFTER 7 DAYS, MAY REMOVE WITH ALCOHOL AND CONTINU (Patient not taking: No sig reported) 6.6 mL 0   No current facility-administered medications on file prior to visit.     Current Status: Since his last office visit, he has c/o mild allergies symptoms. He has increase weight gain, with occasional shortness of breath on exertion. He admits to better sleep since beginning C-PAP machine nightly. He states that he has recently acquired a new apartment and is doing well. He denies visual changes, chest pain, cough, shortness of breath, heart palpitations, and falls. He has occasional headaches and dizziness with position changes. Denies severe headaches, confusion, seizures, double vision, and blurred vision, nausea and vomiting.   He denies fevers, chills, fatigue, recent infections,  weight loss, and night sweats. No reports of GI problems such as nausea, vomiting, diarrhea, and constipation. He has no reports of blood in stools, dysuria and hematuria. No depression or anxiety reported. He has generalized pain today.    Observations/Objective:  Telephone Virtual Visit   Assessment and Plan:  1. Chronic right-sided congestive heart failure  2. Essential hypertension Continue Amlodipine as prescribed. He will continue to decrease high sodium intake,  excessive alcohol intake, increase potassium intake, smoking cessation, and increase physical activity of at least 30 minutes of cardio activity daily. He will continue to follow Heart Healthy or DASH diet.  3. Class 2 severe obesity due to excess calories with serious comorbidity and body mass index (BMI) of 38.0 to 38.9 in adult (HCC) Goal BMI  is <30. Encouraged efforts to reduce weight include engaging in physical activity as tolerated with goal of 150 minutes per week. Improve dietary choices and eat a meal  regimen consistent with a Mediterranean or DASH diet. Reduce simple carbohydrates. Do not skip meals and eat healthy snacks throughout the day to avoid over-eating at dinner. Set a goal weight loss that is achievable for you.  4. Seasonal allergies - fexofenadine (ALLEGRA ALLERGY) 60 MG tablet; Take 1 tablet (60 mg total) by mouth 2 (two) times daily.  Dispense: 30 tablet; Refill: 3 - fluticasone (FLONASE) 50 MCG/ACT nasal spray; Place 2 sprays into both nostrils daily.  Dispense: 16 g; Refill: 6  5. Ready to quit smoking He continues using Nicotine patches as directed.   6. Tobacco abuse  Follow Up Instructions:  She will follow up in 3 months.     I discussed the assessment and treatment plan with the patient. The patient was provided an opportunity to ask questions and all were answered. The patient agreed with the plan and demonstrated an understanding of the instructions.   The patient was advised to call back or seek an in-person evaluation if the symptoms worsen or if the condition fails to improve as anticipated.  I provided 15-20 minutes of non-face-to-face time during this encounter.   Kallie Locks, FNP

## 2018-05-10 NOTE — Telephone Encounter (Signed)
Medication has been sent to pharmacy.  °

## 2018-05-11 ENCOUNTER — Telehealth: Payer: Self-pay

## 2018-05-11 NOTE — Telephone Encounter (Signed)
Patient needs a script for compression socks. Patient is aware that you are out of office.

## 2018-05-15 ENCOUNTER — Encounter (HOSPITAL_COMMUNITY): Payer: Self-pay | Admitting: Cardiology

## 2018-05-15 NOTE — Telephone Encounter (Signed)
Patient notified that hoses have been sent to [facility]

## 2018-05-16 ENCOUNTER — Telehealth: Payer: Self-pay

## 2018-05-16 DIAGNOSIS — R7303 Prediabetes: Secondary | ICD-10-CM

## 2018-05-16 MED ORDER — METFORMIN HCL 500 MG PO TABS: 500.0000 mg | ORAL_TABLET | Freq: Two times a day (BID) | ORAL | 2 refills | Status: AC

## 2018-05-16 NOTE — Telephone Encounter (Signed)
Patient states that his flare up are happening more frequently in his foot. He would like to have a order for crutches or a walker. Patient is having a lot of swelling in his left foot and ankle. Patient thinks he had to much salt. Patient is aware that you are out of office until Tuesday and was advise to go to ED or urgent care if the pain got worse .

## 2018-05-17 ENCOUNTER — Other Ambulatory Visit: Payer: Self-pay | Admitting: Family Medicine

## 2018-05-17 ENCOUNTER — Telehealth (HOSPITAL_COMMUNITY): Payer: Self-pay | Admitting: Cardiology

## 2018-05-17 DIAGNOSIS — M25572 Pain in left ankle and joints of left foot: Secondary | ICD-10-CM

## 2018-05-17 MED FILL — ?METFORMIN HCL 500MG TABLET: 500 | 90 days supply | Qty: 180 | Fill #0

## 2018-05-17 NOTE — Telephone Encounter (Signed)
Pt aware and voiced understanding Declined virtual visit at this time  Will follow up with PCP regarding gout flare However reports his PCP will be out of the office until next week, requests our office to call on his behalf.   Spoke with Dois Davenport with Dr Keturah Barre office and provider will not be covering for her while she is out of the office   Ok to give prednisone and gout medication until PCP returns Uric acid -11.0 per [facility] lab results

## 2018-05-17 NOTE — Telephone Encounter (Signed)
Have him increase torsemide to 40 mg BID x 2 days. Can set up another telephone visit tomorrow or Friday if he'd like. He needs to weigh himself daily so that we can evaluate if this is helping. He may also want to talk to his PCP about possible gout - his uric acid was high in the past and he is not on anything chronic for gout.

## 2018-05-17 NOTE — Telephone Encounter (Signed)
Sent in crutches for this pt. He would need a bariatric rollator due to his weight. This can take time to get approved.

## 2018-05-17 NOTE — Telephone Encounter (Signed)
Pt aware and appreciative of assistance, reports he has enough prednisone to cover 40 mg x 3 days. Will call PCP for further management next week.

## 2018-05-17 NOTE — Telephone Encounter (Signed)
Patient called with multiple concerns regarding BLE edema and pain. Reports he was told to increase torsemide for the swelling at last OV which he has done for the past 3 days. Denies increased breathing, or chest pains. No weight or b/p reading to provide during call  Reports he would like advise on how to manage pain at home as this was previously managed in the ER with pain meds.  Advised his cardiologist providers would be more than happy to address his swelling/CHF symptoms however further pain management would have to come from PCP -tries to elevate as much as possible through out the day -working on getting TED stockings trough medicaid provider  Please advise further regarding painful swelling

## 2018-05-17 NOTE — Progress Notes (Signed)
Patient requesting DME for left foot and ankle pain. Provider recommends crutches and printed script for this. Patient with obesity and would require a bariatric rollator if ordered. Will try crutches first. Printed order and given to C.Hendricks, RMA.

## 2018-05-17 NOTE — Telephone Encounter (Signed)
We can give him prednisone 40 mg x 3 days for acute gout. Let patient know that we typically treat acute flare before starting chronic gout medications, so it is reasonable to wait to talk to PCP next week about chronic medication. Also let him know that he will probably hold onto extra fluid with prednisone, so may have to continue torsemide 40 mg BID (or at least 40 am/20 pm) for the 3 days he is on prednisone.

## 2018-05-17 NOTE — Telephone Encounter (Signed)
Order has been sent to [facility] for crutches . Patient is aware

## 2018-05-19 ENCOUNTER — Encounter (HOSPITAL_COMMUNITY): Payer: Self-pay | Admitting: Cardiology

## 2018-05-20 ENCOUNTER — Other Ambulatory Visit: Payer: Self-pay | Admitting: Emergency Medicine

## 2018-05-22 MED FILL — predniSONE 20 MG TABS: 20 | 30 days supply | Qty: 30 | Fill #0

## 2018-05-29 MED FILL — NICOTINE 7 MG/24HR PATCH: 7 | 28 days supply | Qty: 28 | Fill #1

## 2018-05-30 ENCOUNTER — Other Ambulatory Visit: Payer: Self-pay | Admitting: Emergency Medicine

## 2018-05-30 DIAGNOSIS — I50812 Chronic right heart failure: Secondary | ICD-10-CM

## 2018-05-30 MED FILL — PROAIR HFA 90 MCG INHALER: 108 (90 BAS | 25 days supply | Qty: 9 | Fill #0

## 2018-06-07 ENCOUNTER — Telehealth: Payer: Self-pay | Admitting: Emergency Medicine

## 2018-06-07 MED ORDER — ALBUTEROL SULFATE HFA 108 (90 BASE) MCG/ACT IN AERS: 2.0000 | INHALATION_SPRAY | Freq: Four times a day (QID) | RESPIRATORY_TRACT | 4 refills | Status: AC | PRN

## 2018-06-07 NOTE — Telephone Encounter (Signed)
Returned call to patient He states Albuterol and Pro-Air ineffective He would like to go back to Ventolin. Rx sent for Ventolin Pt uses [facility]. Attempted to call after phone rang multiple time it disconnected. Rx changed to dispense as Ventolin per pt request. Explained Albuterol, Pro-Air and Ventolin are dispensed per formulary. Pt wanted me to attempted. Pt notified Nothing further needed.

## 2018-06-08 ENCOUNTER — Telehealth: Payer: Self-pay

## 2018-06-08 DIAGNOSIS — J302 Other seasonal allergic rhinitis: Secondary | ICD-10-CM

## 2018-06-08 MED ORDER — FEXOFENADINE HCL 60 MG PO TABS: 60.0000 mg | ORAL_TABLET | Freq: Two times a day (BID) | ORAL | 3 refills | Status: AC

## 2018-06-08 NOTE — Telephone Encounter (Signed)
Patient not on zyrtec on current medications list. Hunter [name] was refilled and sent into pharmacy. Thanks!

## 2018-06-13 NOTE — Telephone Encounter (Signed)
Patient was able to get his compression socks and he was able to get in touch with [employer] regarding glasses.

## 2018-06-13 NOTE — Telephone Encounter (Signed)
Left a vm for patient to callback 

## 2018-06-15 NOTE — Telephone Encounter (Signed)
Message sent to provider 

## 2018-06-16 MED FILL — VENTOLIN HFA 90 MCG INHALER: 108 (90 BAS | 25 days supply | Qty: 18 | Fill #0

## 2018-06-22 ENCOUNTER — Other Ambulatory Visit: Payer: Self-pay | Admitting: General Surgery

## 2018-06-22 ENCOUNTER — Other Ambulatory Visit: Payer: Self-pay

## 2018-06-22 ENCOUNTER — Ambulatory Visit: Payer: Self-pay | Admitting: Emergency Medicine

## 2018-06-22 ENCOUNTER — Encounter: Payer: Self-pay | Admitting: Nurse Practitioner

## 2018-06-22 ENCOUNTER — Ambulatory Visit (INDEPENDENT_AMBULATORY_CARE_PROVIDER_SITE_OTHER): Payer: Medicaid Other | Admitting: Nurse Practitioner

## 2018-06-22 DIAGNOSIS — D860 Sarcoidosis of lung: Secondary | ICD-10-CM

## 2018-06-22 NOTE — Progress Notes (Signed)
Virtual Visit via Telephone Note  I connected with Hunter [name] on 06/22/18 at 11:30 AM EDT by telephone and verified that I am speaking with the correct person using two identifiers.  Location: Patient: home Provider: office   I discussed the limitations, risks, security and privacy concerns of performing an evaluation and management service by telephone and the availability of in person appointments. I also discussed with the patient that there may be a patient responsible charge related to this service. The patient expressed understanding and agreed to proceed.   History of Present Illness: 48 year old male with sarcoidosis, obesity, hypertension with diastolic dysfunction, chronic renal insufficiency, and OSA on BiPAP.  Patient is followed by Dr. Delton Coombes  Patient has a follow-up telephone visit today.  He was last seen by Dr. Delton Coombes on 04/24/2018.  At that visit his prednisone was decreased.  Patient states that he has now weaned himself off of prednisone.  This is been a stable interval for him.  He has been doing well without any prednisone.  He still has shortness of breath with exertion.  He is on continuous O2.  He states that he is compliant with BiPAP and wears it nightly.  He uses Ventolin occasionally as needed.  He states that he has been self isolating due to current COVID pandemic.  He states that he has become a little more deconditioned and has gained weight while staying at home.  Denies f/c/s, n/v/d, hemoptysis, PND, leg swelling.     Observations/Objective: CTA 12/06/17 - Cardiomegaly with chronic marked dilatation of the main pulmonary artery compatible pulmonary hypertension. No large central pulmonary embolus is identified. Peripheral pulmonary arteries are limited due to timing of contrast motion artifacts.  CXR 12/07/18 - Cardiomegaly with increased interstitial opacities, likely mild interstitial pulmonary edema superimposed on chronic interstitial changes related  sarcoidosis. Chronic right lung volume loss with small pleural effusion.  PFT Results Latest Ref Rng & Units 02/12/2016  FVC-Pre L 1.72  FVC-Predicted Pre % 38  FVC-Post L 1.80  FVC-Predicted Post % 40  Pre FEV1/FVC % % 71  Post FEV1/FCV % % 75  FEV1-Pre L 1.23  FEV1-Predicted Pre % 34  FEV1-Post L 1.36  DLCO UNC% % 62  DLCO COR %Predicted % 137  TLC L 3.99  TLC % Predicted % 55  RV % Predicted % 105     Assessment and Plan: Sarcoid: Patient states that this is been a stable interval for him.  He has been able to wean off of prednisone.  OSA: Patient has been compliant with BiPAP and wears it nightly.  States that he does benefit from use.  Patient Instructions  Please continue to wear your BiPAP every night as you have been doing it. Continue your oxygen at all times as you have been wearing it. May stop prednisone  We will plan to repeat your pulmonary function testing and CT scan of the chest in the near future to assess the status of your pulmonary sarcoidosis.  Follow Up Instructions:  Follow up with Dr. Delton Coombes in 4 months or sooner if needed    I discussed the assessment and treatment plan with the patient. The patient was provided an opportunity to ask questions and all were answered. The patient agreed with the plan and demonstrated an understanding of the instructions.   The patient was advised to call back or seek an in-person evaluation if the symptoms worsen or if the condition fails to improve as anticipated.  I provided 22 minutes  of non-face-to-face time during this encounter.   Fenton Foy, NP

## 2018-06-22 NOTE — Assessment & Plan Note (Signed)
Sarcoid: Patient states that this is been a stable interval for him.  He has been able to wean off of prednisone.  OSA: Patient has been compliant with BiPAP and wears it nightly.  States that he does benefit from use.  Patient Instructions  Please continue to wear your BiPAP every night as you have been doing it. Continue your oxygen at all times as you have been wearing it. May stop prednisone  We will plan to repeat your pulmonary function testing and CT scan of the chest in the near future to assess the status of your pulmonary sarcoidosis.  Follow up: Follow up with Dr. Delton Coombes in 4 months or sooner if needed

## 2018-06-22 NOTE — Patient Instructions (Signed)
Please continue to wear your BiPAP every night as you have been doing it. Continue your oxygen at all times as you have been wearing it. May stop prednisone  We will plan to repeat your pulmonary function testing and CT scan of the chest in the near future to assess the status of your pulmonary sarcoidosis.  Follow up: Follow up with Dr. Delton Coombes in 4 months or sooner if needed

## 2018-06-23 ENCOUNTER — Other Ambulatory Visit: Payer: Self-pay | Admitting: General Surgery

## 2018-06-23 DIAGNOSIS — D869 Sarcoidosis, unspecified: Secondary | ICD-10-CM

## 2018-06-24 MED FILL — predniSONE 20 MG TABS: 20 | 30 days supply | Qty: 30 | Fill #1

## 2018-07-04 MED FILL — VENTOLIN HFA 90 MCG INHALER: 108 (90 BAS | 25 days supply | Qty: 18 | Fill #1

## 2018-07-04 MED FILL — NICOTINE 7 MG/24HR PATCH: 7 | 28 days supply | Qty: 28 | Fill #2

## 2018-07-25 MED FILL — metFORMIN HCL 500 MG TABS: 500 | 30 days supply | Qty: 60 | Fill #0

## 2018-07-25 MED FILL — VENTOLIN HFA 90 MCG INHALER: 108 (90 BAS | 25 days supply | Qty: 18 | Fill #2

## 2018-08-07 ENCOUNTER — Other Ambulatory Visit: Payer: Self-pay | Admitting: Family Medicine

## 2018-08-07 DIAGNOSIS — F17200 Nicotine dependence, unspecified, uncomplicated: Secondary | ICD-10-CM

## 2018-08-07 MED FILL — TORSEMIDE 20 MG TABLET: 20 | 90 days supply | Qty: 180 | Fill #1

## 2018-08-09 ENCOUNTER — Other Ambulatory Visit: Payer: Self-pay | Admitting: Family Medicine

## 2018-08-09 DIAGNOSIS — F17200 Nicotine dependence, unspecified, uncomplicated: Secondary | ICD-10-CM

## 2018-08-09 MED FILL — AMLODIPINE BESYLATE 10 MG T: 10 | 30 days supply | Qty: 30 | Fill #1

## 2018-08-14 ENCOUNTER — Ambulatory Visit: Payer: Medicaid Other | Admitting: Family Medicine

## 2018-08-16 ENCOUNTER — Other Ambulatory Visit: Payer: Self-pay | Admitting: Podiatry

## 2018-08-16 ENCOUNTER — Other Ambulatory Visit: Payer: Self-pay

## 2018-08-16 ENCOUNTER — Telehealth: Payer: Self-pay

## 2018-08-16 DIAGNOSIS — F17200 Nicotine dependence, unspecified, uncomplicated: Secondary | ICD-10-CM

## 2018-08-16 MED ORDER — NICOTINE 7 MG/24HR TD PT24: 7.0000 mg | MEDICATED_PATCH | TRANSDERMAL | 2 refills | Status: AC

## 2018-08-16 MED FILL — CICLOPIROX 8% SOLUTION: 8 | 30 days supply | Qty: 7 | Fill #0

## 2018-08-16 MED FILL — predniSONE 20 MG TABS: 20 | 30 days supply | Qty: 30 | Fill #2

## 2018-08-16 MED FILL — NICOTINE 7 MG/24HR PATCH: 7 | 28 days supply | Qty: 28 | Fill #0

## 2018-08-17 ENCOUNTER — Telehealth: Payer: Self-pay

## 2018-08-17 NOTE — Telephone Encounter (Signed)
Left a vm for patient to callback 

## 2018-08-17 NOTE — Telephone Encounter (Signed)
Patient was given number for [facility]

## 2018-08-17 NOTE — Telephone Encounter (Signed)
Patient needs a order for orthotics insert. Patient would like them to be sent to [facility]. I spoke with [facility] and they state that patient has to actually she a MD in order for insurance to process the order for insert. They state that the MD can not just sign the NP note. Please advise how we need to handle these matters.

## 2018-08-23 NOTE — Telephone Encounter (Signed)
Patient notified that Hunter [name] will be calling to get him schedule to see Dr. Doreene Burke for diabetic supplies.

## 2018-09-04 ENCOUNTER — Telehealth: Payer: Self-pay | Admitting: Emergency Medicine

## 2018-09-04 ENCOUNTER — Other Ambulatory Visit: Payer: Self-pay | Admitting: Family Medicine

## 2018-09-04 DIAGNOSIS — G4733 Obstructive sleep apnea (adult) (pediatric): Secondary | ICD-10-CM

## 2018-09-04 NOTE — Telephone Encounter (Signed)
Attempted to call pt but unable to reach. Left message for pt to return call.  I have pended the order for cpap supplies. Will send order after speak with pt in regards to this to see if there is anything else he is needing.

## 2018-09-05 ENCOUNTER — Ambulatory Visit (HOSPITAL_BASED_OUTPATIENT_CLINIC_OR_DEPARTMENT_OTHER): Payer: Medicaid Other | Attending: Family Medicine | Admitting: Radiology

## 2018-09-05 ENCOUNTER — Encounter: Payer: Self-pay | Admitting: Internal Medicine

## 2018-09-05 ENCOUNTER — Other Ambulatory Visit: Payer: Self-pay

## 2018-09-05 ENCOUNTER — Ambulatory Visit (INDEPENDENT_AMBULATORY_CARE_PROVIDER_SITE_OTHER): Payer: Medicaid Other | Admitting: Internal Medicine

## 2018-09-05 VITALS — BP 143/83 | HR 78 | Temp 98.1°F | Resp 18 | Ht 72.0 in | Wt 324.0 lb

## 2018-09-05 DIAGNOSIS — M79671 Pain in right foot: Secondary | ICD-10-CM | POA: Diagnosis not present

## 2018-09-05 DIAGNOSIS — I10 Essential (primary) hypertension: Secondary | ICD-10-CM

## 2018-09-05 DIAGNOSIS — R7303 Prediabetes: Secondary | ICD-10-CM

## 2018-09-05 DIAGNOSIS — G4733 Obstructive sleep apnea (adult) (pediatric): Secondary | ICD-10-CM

## 2018-09-05 LAB — POCT GLYCOSYLATED HEMOGLOBIN (HGB A1C): Hemoglobin A1C: 6.2 % — AB (ref 4.0–5.6)

## 2018-09-05 NOTE — Telephone Encounter (Signed)
Called and spoke with pt in regards to him needing supplies for his machine. Verified which DME pt uses and have placed order. Nothing further needed.

## 2018-09-05 NOTE — Patient Instructions (Signed)
COPD and Physical Activity °Chronic obstructive pulmonary disease (COPD) is a long-term (chronic) condition that affects the lungs. COPD is a general term that can be used to describe many different lung problems that cause lung swelling (inflammation) and limit airflow, including chronic bronchitis and emphysema. °The main symptom of COPD is shortness of breath, which makes it harder to do even simple tasks. This can also make it harder to exercise and be active. Talk with your health care provider about treatments to help you breathe better and actions you can take to prevent breathing problems during physical activity. °What are the benefits of exercising with COPD? °Exercising regularly is an important part of a healthy lifestyle. You can still exercise and do physical activities even though you have COPD. Exercise and physical activity improve your shortness of breath by increasing blood flow (circulation). This causes your heart to pump more oxygen through your body. Moderate exercise can improve your: °· Oxygen use. °· Energy level. °· Shortness of breath. °· Strength in your breathing muscles. °· Heart health. °· Sleep. °· Self-esteem and feelings of self-worth. °· Depression, stress, and anxiety levels. °Exercise can benefit everyone with COPD. The severity of your disease may affect how hard you can exercise, especially at first, but everyone can benefit. Talk with your health care provider about how much exercise is safe for you, and which activities and exercises are safe for you. °What actions can I take to prevent breathing problems during physical activity? °· Sign up for a pulmonary rehabilitation program. This type of program may include: °? Education about lung diseases. °? Exercise classes that teach you how to exercise and be more active while improving your breathing. This usually involves: °§ Exercise using your lower extremities, such as a stationary bicycle. °§ About 30 minutes of exercise, 2  to 5 times per week, for 6 to 12 weeks °§ Strength training, such as push ups or leg lifts. °? Nutrition education. °? Group classes in which you can talk with others who also have COPD and learn ways to manage stress. °· If you use an oxygen tank, you should use it while you exercise. Work with your health care provider to adjust your oxygen for your physical activity. Your resting flow rate is different from your flow rate during physical activity. °· While you are exercising: °? Take slow breaths. °? Pace yourself and do not try to go too fast. °? Purse your lips while breathing out. Pursing your lips is similar to a kissing or whistling position. °? If doing exercise that uses a quick burst of effort, such as weight lifting: °§ Breathe in before starting the exercise. °§ Breathe out during the hardest part of the exercise (such as raising the weights). °Where to find support °You can find support for exercising with COPD from: °· Your health care provider. °· A pulmonary rehabilitation program. °· Your local health department or community health programs. °· Support groups, online or in-person. Your health care provider may be able to recommend support groups. °Where to find more information °You can find more information about exercising with COPD from: °· American Lung Association: lung.org. °· COPD Foundation: copdfoundation.org. °Contact a health care provider if: °· Your symptoms get worse. °· You have chest pain. °· You have nausea. °· You have a fever. °· You have trouble talking or catching your breath. °· You want to start a new exercise program or a new activity. °Summary °· COPD is a general term that can   be used to describe many different lung problems that cause lung swelling (inflammation) and limit airflow. This includes chronic bronchitis and emphysema. °· Exercise and physical activity improve your shortness of breath by increasing blood flow (circulation). This causes your heart to provide more  oxygen to your body. °· Contact your health care provider before starting any exercise program or new activity. Ask your health care provider what exercises and activities are safe for you. °This information is not intended to replace advice given to you by your health care provider. Make sure you discuss any questions you have with your health care provider. °Document Released: 02/17/2017 Document Revised: 05/17/2018 Document Reviewed: 02/17/2017 °Elsevier Patient Education © 2020 Elsevier Inc. ° °

## 2018-09-05 NOTE — Progress Notes (Signed)
Hunter [name], is a 48 y.o. male  RXV:[id number]  [id number]  DOB - [birth year]  Chief Complaint  Patient presents with  . Diabetes    needs exam for diabetic foot shoe insoles.       Subjective:   Hunter [name] is a 48 y.o. male with sarcoidosis, obesity, prediabetes hypertension with diastolic dysfunction, chronic renal insufficiency, pulmonary hypertension and OSA on BiPAP who presents here today for a follow up visit mindful diabetic foot examination.  Patient has no new complaints today. He endorses adherence to medications denies any side effects. Patient claims he is doing well with his prednisolone.  Patient denies any recent fall.  Patient maintains regular follow-up with pulmonologist patient has No headache, No chest pain, No abdominal pain - No Nausea, No new weakness tingling or numbness, No Cough.  No fever.  No joint swelling or redness.  He denies any active depression, denies any suicidal ideations or thoughts.  ALLERGIES: No Known Allergies  PAST MEDICAL HISTORY: Past Medical History:  Diagnosis Date  . Arthritis    feet  . CHF (congestive heart failure) ([place])   . Dyspnea   . Hypertension   . Nephrotic syndrome   . Pre-diabetes   . Sarcoid   . Sarcoidosis   . Sleep apnea     MEDICATIONS AT HOME: Prior to Admission medications   Medication Sig Start Date End Date Taking? Authorizing Provider  albuterol (VENTOLIN HFA) 108 (90 Base) MCG/ACT inhaler Inhale 2 puffs into the lungs every 6 (six) hours as needed for wheezing or shortness of breath. 06/07/18  Yes Byrum, Rose Fillers, MD  amLODipine (NORVASC) 10 MG tablet Take 1 tablet (10 mg total) by mouth daily. 05/02/18  Yes Clegg, Amy D, NP  ciclopirox (PENLAC) 8 % solution APPLY TOPICALLY AT BEDTIME OVER NAIL AND SURROUNDING SKIN. APPLY DAILY OVER PREVIOUS COAT. AFTER 7 DAYS, MAY REMOVE WITH ALCOHOL AND CONTINU 08/16/18  Yes Trula Slade, DPM  diclofenac sodium (VOLTAREN) 1 % GEL APPLY 2 GRAMS TO FOOT (AND  AFFECTED AREAS) 2-4 TIMES DAILY. 04/07/17  Yes Trula Slade, DPM  fexofenadine (ALLEGRA ALLERGY) 60 MG tablet Take 1 tablet (60 mg total) by mouth 2 (two) times daily. 06/08/18  Yes Azzie Glatter, FNP  fluticasone (FLONASE) 50 MCG/ACT nasal spray Place 2 sprays into both nostrils daily. 05/10/18  Yes Azzie Glatter, FNP  metFORMIN (GLUCOPHAGE) 500 MG tablet Take 1 tablet (500 mg total) by mouth 2 (two) times daily with a meal. 05/16/18  Yes Azzie Glatter, FNP  naproxen sodium (ALEVE) 220 MG tablet Take 220 mg by mouth daily as needed.   Yes [provider]  nicotine (NICODERM CQ - DOSED IN MG/24 HR) 7 mg/24hr patch Place 1 patch (7 mg total) onto the skin daily. 08/16/18 11/14/18 Yes Azzie Glatter, FNP  potassium chloride SA (K-DUR,KLOR-CON) 20 MEQ tablet Take 1 tablet (20 mEq total) by mouth daily. 05/02/18  Yes Clegg, Amy D, NP  predniSONE (DELTASONE) 20 MG tablet TAKE 1 TABLET (20 MG TOTAL) BY MOUTH DAILY WITH BREAKFAST. 05/22/18  Yes Collene Gobble, MD  predniSONE (DELTASONE) 5 MG tablet Take 10mg  daily for 3 weeks, then 5mg  daily until OV in 2 months Patient taking differently: Take 5 mg by mouth every other day.  02/21/18  Yes Collene Gobble, MD  torsemide (DEMADEX) 20 MG tablet Take 2 tablets (40 mg total) by mouth daily. 05/02/18  Yes Clegg, Amy D, NP  traMADol (ULTRAM) 50 MG  tablet Take 1 tablet (50 mg total) by mouth 2 (two) times daily as needed for severe pain. 02/06/18  Yes Kallie LocksStroud, Natalie M, FNP    Objective:   Vitals:   09/05/18 1112  BP: (!) 143/83  Pulse: 78  Resp: 18  Temp: 98.1 F (36.7 C)  TempSrc: Oral  SpO2: 98%  Weight: (!) 324 lb (147 kg)  Height: 6' (1.829 m)  PF: (!) 4 L/min   Exam General appearance : Awake, alert, not in any distress. Speech Clear. Not toxic looking, morbidly obese, on oxygen HEENT: Atraumatic and Normocephalic, pupils equally reactive to light and accomodation Neck: Supple, no JVD. No cervical lymphadenopathy.  Chest:  Good air entry bilaterally, no added sounds  CVS: S1 S2 regular, no murmurs.  Abdomen: Bowel sounds present, Non tender and not distended with no gaurding, rigidity or rebound. Extremities: B/L Lower Ext shows no edema, both legs are warm to touch Neurology: Awake alert, and oriented X 3, CN II-XII intact, Non focal Skin: No Rash  Data Review Lab Results  Component Value Date   HGBA1C 6.2 (A) 09/05/2018   HGBA1C 6.1 (H) 12/07/2017   HGBA1C 5.3 07/25/2017    Assessment & Plan   1. Prediabetes  - HgB A1c is 6.2 today   2. Morbid obesity (HCC) Aim for 30 minutes of exercise most days. Rethink what you drink. Water is great! Aim for 2-3 Carb Choices per meal (30-45 grams) +/- 1 either way  Aim for 0-15 Carbs per snack if hungry  Include protein in moderation with your meals and snacks  Consider reading food labels for Total Carbohydrate and Fat Grams of foods  Consider checking BG at alternate times per day  Continue taking medication as directed Be mindful about how much sugar you are adding to beverages and other foods. Fruit Punch - find one with no sugar  Measure and decrease portions of carbohydrate foods  Make your plate and don't go back for seconds  3. Right foot pain Podiatrist referral, evaluation and Diabetic shoes insole  4. Essential hypertension We have discussed target BP range and blood pressure goal. I have advised patient to check BP regularly and to call us back or report to clinic if the numbers are consistently higher than 140/90. We discussed the importance of compliance with medical therapy and DASH diet recommended, consequences of uncontrolled hypertension discussed.  - continue current BP medications  Patient have been counseled extensively about nutrition and exercise. Other issues discussed during this visit include: low cholesterol diet, weight control and daily exercise, foot care, annual eye examinations at Ophthalmology, importance of adherence  with medications and regular follow-up. We also discussed long term complications of uncontrolled diabetes and hypertension.   Return in about 3 months (around 12/06/2018) for Hemoglobin A1C and Follow up, DM, Follow up HTN, COPD.  The patient was given clear instructions to go to ER or return to medical center if symptoms don't improve, worsen or new problems develop. The patient verbalized understanding. The patient was told to call to get lab results if they haven't heard anything in the next week.   This note has been created with Education officer, environmentalDragon speech recognition software and smart phrase technology. Any transcriptional errors are unintentional.    Jeanann Lewandowskylugbemiga Denece Shearer, MD, MHA, Maxwell CaulFACP, FAAP, CPE [facility] [place], [place] [phone]   09/05/2018, 11:55 AM

## 2018-09-07 ENCOUNTER — Telehealth: Payer: Self-pay | Admitting: Emergency Medicine

## 2018-09-07 DIAGNOSIS — G4733 Obstructive sleep apnea (adult) (pediatric): Secondary | ICD-10-CM

## 2018-09-07 NOTE — Telephone Encounter (Signed)
Spoke with patient. He stated that he is having a hard time with his cpap mask. He is currently using the AirFit N20 nasal cushion, size large. He stated that the cushion is constantly cracking. [employer] is only giving him one cushion per 3 months. The cushions are not lasting 3 months, he stated they are doing good to last 3 weeks.   He stated that he called Medicaid to see how often he can get supplies. They advised him that he can get supplies monthly and they will pay. In regards to the cushions, he can receive up to 12 per year.   He wants to know if we could send an order to [employer] for them to allow him to have 12 nasal cushions.   Hunter [name], you were the last one to see him back in May. Are you ok with [place] placing this order? Thanks!

## 2018-09-07 NOTE — Telephone Encounter (Signed)
Yes. Please send updated order. Thanks.

## 2018-09-07 NOTE — Telephone Encounter (Signed)
Spoke with patient. Advised that [place] had approved his order. He verbalized understanding. Nothing further needed at time of call.

## 2018-09-19 ENCOUNTER — Other Ambulatory Visit: Payer: Self-pay

## 2018-09-19 ENCOUNTER — Encounter: Payer: Self-pay | Admitting: Family Medicine

## 2018-09-19 DIAGNOSIS — Z20822 Contact with and (suspected) exposure to covid-19: Secondary | ICD-10-CM

## 2018-09-19 MED FILL — predniSONE 20 MG TABS: 20 | 30 days supply | Qty: 30 | Fill #3

## 2018-09-19 MED FILL — AMLODIPINE BESYLATE 10 MG T: 10 | 30 days supply | Qty: 30 | Fill #1

## 2018-09-19 MED FILL — VENTOLIN HFA 90 MCG INHALER: 108 (90 BAS | 25 days supply | Qty: 18 | Fill #3

## 2018-09-20 LAB — NOVEL CORONAVIRUS, NAA: SARS-CoV-2, NAA: NOT DETECTED

## 2018-09-21 MED FILL — NICOTINE 21 MG/24HR PATCH: 21 | 28 days supply | Qty: 28 | Fill #0

## 2018-09-22 ENCOUNTER — Telehealth: Payer: Self-pay

## 2018-09-22 NOTE — Telephone Encounter (Signed)
Patient will have a transportation form faxed over to be completed

## 2018-09-25 MED FILL — metFORMIN HCL 500 MG TABS: 500 | 30 days supply | Qty: 60 | Fill #1

## 2018-09-26 ENCOUNTER — Other Ambulatory Visit: Payer: Self-pay

## 2018-09-26 ENCOUNTER — Ambulatory Visit (INDEPENDENT_AMBULATORY_CARE_PROVIDER_SITE_OTHER): Payer: Medicaid Other | Admitting: Emergency Medicine

## 2018-09-26 ENCOUNTER — Encounter: Payer: Self-pay | Admitting: Emergency Medicine

## 2018-09-26 VITALS — BP 126/70 | HR 95

## 2018-09-26 DIAGNOSIS — E790 Hyperuricemia without signs of inflammatory arthritis and tophaceous disease: Secondary | ICD-10-CM | POA: Diagnosis not present

## 2018-09-26 DIAGNOSIS — G4733 Obstructive sleep apnea (adult) (pediatric): Secondary | ICD-10-CM | POA: Diagnosis not present

## 2018-09-26 DIAGNOSIS — D860 Sarcoidosis of lung: Secondary | ICD-10-CM | POA: Diagnosis not present

## 2018-09-26 DIAGNOSIS — E662 Morbid (severe) obesity with alveolar hypoventilation: Secondary | ICD-10-CM | POA: Diagnosis not present

## 2018-09-26 NOTE — Assessment & Plan Note (Signed)
Continue to use the supplemental oxygen. Continue to walk and continue to make dietary modifications such as limiting calorie consumption in an effort to decrease your weight.

## 2018-09-26 NOTE — Assessment & Plan Note (Signed)
Weaned off of chronic prednisone following his last appointment in March 2020. Has been off since April. He does endorse persistent dyspnea and thigh muscle weakness. Given the lack of definitive certainty that this is related to pulmonary sarcoidosis we will obtain a CT chest and PFT's prior to his return visit now that he is off of prednisone. At that time, consider additional inhaler therapy as indicated.  Continue Albuterol PRN for now and supplemental oxygen

## 2018-09-26 NOTE — Assessment & Plan Note (Signed)
He has notable elevated serum uric acid per chart review. Given this and his symptoms there is concdern for gout. These episodes are not classic in description or symptomatology. I have encouraged the patient to follow with his PCP and consider joint aspiration with a subsequent flare for more definitive diagnosis. IF positive, they can consider an uric acid lowering agent as chronic suppressive therapy to better prevent flares.

## 2018-09-26 NOTE — Patient Instructions (Signed)
Thank you for your visit today. It was a pleasure meeting you today. Please schedule an appointment with Dr. Lamonte Sakai following you pulmonary function testing preferably the day it is completed at the next available appointment time.   You will also need to complete the CT scan of the chest now that you are off of prednisone to better determine how related the sarcoidosis is to your shortness of breath before your next appointment as well.  We will continue to look into the CPAP masks to make certain they are available.  You can continue to use the albuterol inhaler as needed and we will consider any medication change as indicated after you complete the lung tests.

## 2018-09-26 NOTE — Progress Notes (Signed)
Subjective:    Patient ID: Hunter GasserRobert [name], male    DOB: 01-Apr-1970, 48 y.o.   MRN: [id number]  HPI  ROV 02/21/2018 --Hunter [name] is [age], has a history of sarcoidosis, obesity, hypertension with diastolic dysfunction, chronic renal insufficiency (FSGS).  He has chronic interstitial disease on chest imaging which is been overall stable.  He is also on BiPAP for OSA/obesity hypoventilation syndrome.  At my last visit I increased his IPAP to 14 (14/8+3 L/min oxygen).  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  He was treated with corticosteroids and I planned to begin tapering him in September. As he was tapering the pred, he developed R foot pain (? Gout), was admitted for this and for HTN. He was down to 20mg  pred at that time. His breathing is doing well. He notes good proximal muscle strength, he still has some difficulty w stairs, rising from a chair. He tells me that he is using his BiPAP every night reliably.   His current pred dose is 20mg   Addendum: I now have the patient's Airview compliance download available from 12/15 to 02/20/2018.  He used the device 93% of the time, 90% for greater than 4 hours each evening.  His IPAP is still set at 13, EPAP at 8.  No significant leak, good control of his central and obstructive apneas.  ROV 04/24/2018 --this is a follow-up visit for patient with a history of sarcoidosis, hypertension, obesity, OSA on BiPAP.  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  I continue to taper his prednisone at his last visit in January from prednisone 20, he is now on prednisone 5mg . He hasn't really noticed any worsening of sx as the pred has been tapered - . He is dealing with nasal congestion, more dyspnea. He is out of amlodipine, is hypertensive today, is supposed to follow with his PCP. He is also wondering about whether he needs to stay on K, Mg supplementation.  He  continues to use his BiPAP reliably. Uses albuterol about 2-3x a day.    Plan repeat PFT and Ct chest once off the pred.   He still has some muscle weakness, especially when climbing stairs. 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   R heart cath 11/11/16:  PAP 49/20 (30)  PAOP mean 10 PVR 3.6 Wood units Her cardiac output (Fick) 5.57  ROV 09/26/2018 -- Follow-up visit for sarcoidosis and OSA/obesity hypoventilation syndrome. The patient continues to endorse dyspnea and weakness now worse off of prednisone. He stated that he has remained off of prednisone since his last appointment with Hunter [name]. Since that time he reports at least 4 episodes of pain and swelling in his feet that were treated as presumed gout with a course of high dose prednisone of 60-80mg  daily. This was associated with marked edema of the feet/ankles for which he was treated with lasix as well. The pain typically improves with each treatment after 2-3 days of therapy. He denied redness or point tenderness and stated that the painful joint varies with each episode.  He also endorses a marked weight gain over the past year despite decreasing his dose of prednisone. His primary concern today is with his fatigue, dyspnea, weight gain and generally feeling unwell and feels this is inconsistent with his cessation of the prednisone.   Review of Systems  Constitutional: Positive for activity change (Decreased due  to fatigue/dyspnea) and unexpected weight change. Negative for appetite change and diaphoresis.  HENT: Negative for congestion, sinus pressure and sinus pain.   Eyes: Negative for pain and redness.  Respiratory: Positive for apnea and shortness of breath. Negative for cough, choking, chest tightness, wheezing and stridor.   Cardiovascular: Positive for leg swelling. Negative for chest pain and palpitations.  Gastrointestinal: Negative for abdominal distention, abdominal pain, diarrhea and nausea.    Genitourinary: Negative for dysuria and hematuria.  Musculoskeletal: Positive for arthralgias, gait problem and myalgias (Thighs). Negative for back pain.  Neurological: Positive for dizziness and headaches.       Objective:   Physical Exam Vitals:   09/26/18 1133  BP: 126/70  Pulse: 95  SpO2: 92%   General: A/O x4, in no acute distress, afebrile, nondiaphoretic HEENT: PEERL, EMO intact Cardio: RRR, no mrg's  Pulmonary: CTA bilaterally, no wheezing or crackles  Abdomen: Bowel sounds normal, soft, nontender  MSK: BLE nontender, nonedematous Neuro: Alert,  conversational, normal gait Psych: Appropriate affect, not depressed in appearance, engages well    Assessment & Plan:  Obesity hypoventilation syndrome (HCC) Continue to use the supplemental oxygen. Continue to walk and continue to make dietary modifications such as limiting calorie consumption in an effort to decrease your weight.   OSA (obstructive sleep apnea) Compliance Report: Usage 29/30 days or 97% from 7/19-8/17/2020 with 97% >4 hours.  IPAP 13 EPAP 8 No marked consistent leak. Appears well controlled. There is an ongoing issue with his masks and them cracking which the insurance company and home health agency have remedied with providing 12 masks per year. We will make certain this is processed.     Sarcoidosis of lung (HCC) Weaned off of chronic prednisone following his last appointment in March 2020. Has been off since April. He does endorse persistent dyspnea and thigh muscle weakness. Given the lack of definitive certainty that this is related to pulmonary sarcoidosis we will obtain a CT chest and PFT's prior to his return visit now that he is off of prednisone. At that time, consider additional inhaler therapy as indicated.  Continue Albuterol PRN for now and supplemental oxygen  Elevated uric acid in blood He has notable elevated serum uric acid per chart review. Given this and his symptoms there is  concdern for gout. These episodes are not classic in description or symptomatology. I have encouraged the patient to follow with his PCP and consider joint aspiration with a subsequent flare for more definitive diagnosis. IF positive, they can consider an uric acid lowering agent as chronic suppressive therapy to better prevent flares.   Hunter BalLawrence Adraine Biffle, MD [facility], PGY-3  Please see A/P and/or Addendum for final recommendations by: Hunter [name]  Attending Note:  I have examined patient, reviewed labs, studies and notes.   Follow-up visit for 48 year old gentleman with a history of sarcoidosis, associated obstructive lung disease.  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  We have been weaning his prednisone and the weakness has not really changed significantly (better or worse).  Since last visit we were able to wean him down to 0.  He does report symptoms since stopping the prednisone but unclear to me whether this is truly related to sarcoid.  Includes some joint discomfort, foot discomfort, question gouty pain.  His complaints are difficult to explain by sarcoidosis alone.  At this point I think we should repeat his pulmonary function testing and a CT  scan of the chest to definitively clarify whether there is any active pulmonary disease, his current baseline off prednisone.  With regard to his obstructive sleep apnea and obesity hypoventilation syndrome he unfortunately has gained weight.  He has good compliance with his BiPAP as confirmed by his BiPAP download today.  He is having some difficulty with his mask developing defects and cracks.  He needs a DME order today to allow him to get an adequate number replacements given the frequency of breakdown.    Hunter Apo, MD, PhD 09/26/2018, 5:25 PM [facility] [phone] or if no answer [phone]

## 2018-09-26 NOTE — Assessment & Plan Note (Signed)
Compliance Report: Usage 29/30 days or 97% from 7/19-8/17/2020 with 97% >4 hours.  IPAP 13 EPAP 8 No marked consistent leak. Appears well controlled. There is an ongoing issue with his masks and them cracking which the insurance company and home health agency have remedied with providing 12 masks per year. We will make certain this is processed.

## 2018-09-28 ENCOUNTER — Telehealth (HOSPITAL_COMMUNITY): Payer: Self-pay | Admitting: *Deleted

## 2018-09-28 ENCOUNTER — Telehealth: Payer: Self-pay | Admitting: Emergency Medicine

## 2018-09-28 NOTE — Telephone Encounter (Signed)
Pt advised and 66m f/u scheduled per pts request.

## 2018-09-28 NOTE — Telephone Encounter (Signed)
Call returned to patient, he reports increased SOB and pain in his upper back when he takes a deep breath. He states it started this morning. He thinks he may have pulled something in his sleep. He states he was just seen but his SOB was not that bad when he saw RB. He reports he took some aleve that helped ease the pain but he is still having the heaviness in his chest. He says he has some prednisone left over and he was wondering if he should take some. He is also requesting a CXR. I inquired as to whether any of this was going on when he was seen 08/18 and he reports no, he has gotten worse over the last day. Using 3L with exertion and 2L at rest. Reports using albuterol about 2x/day.   NKDA  BM please advise. Pt requesting CXR and recommendations.

## 2018-09-28 NOTE — Telephone Encounter (Signed)
Needs to follow up with pulmonary.   Amy Clegg NP-C  12:14 PM

## 2018-09-28 NOTE — Telephone Encounter (Signed)
Patient needs to contact his primary care provider regarding back pain.  With patient's obscure symptoms as well as increased shortness of breath and pain in his upper back he likely needs further evaluation I would recommend either presenting to an emergency room or following up with primary care for further evaluation.  With heaviness in his chest as well as his worsening shortness of breath he may need to be tested for SARS-CoV-2 as well as have baseline blood work and likely imaging.  Wyn Quaker, FNP

## 2018-09-28 NOTE — Telephone Encounter (Signed)
Call returned to patient, made aware of B Mack recommendations. Patient was not happy with recommendations stating he does not understand why this can't be addressed in the office and why he needs to go to ED. I explained that the providers feels that an ED visit would be best for current symptoms as well as further work up if needed. Patient was not happy with that and asked that I have the provider get him a CXR or something beside the ED. Voiced understanding.   B Mack please advise. Thanks.

## 2018-09-28 NOTE — Telephone Encounter (Signed)
Pt called c/o shortness of breath more than usual.  Pain in back when he bends over.   Patient tested negative for covid last week. Pt saw pulmonary on 8/18 (see note below) but wants an appt or chest xray.pulm. ordered chest ct for 9/17. Pt denies swelling, cough, fever, chills, pt states his weight is stable.  Routed to Amy for advice  Note from [place]  Follow-up visit for 48 year old gentleman with a history of sarcoidosis, associated obstructive lung disease.  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  We have been weaning his prednisone and the weakness has not really changed significantly (better or worse).  Since last visit we were able to wean him down to 0.  He does report symptoms since stopping the prednisone but unclear to me whether this is truly related to sarcoid.  Includes some joint discomfort, foot discomfort, question gouty pain.  His complaints are difficult to explain by sarcoidosis alone.  At this point I think we should repeat his pulmonary function testing and a CT scan of the chest to definitively clarify whether there is any active pulmonary disease, his current baseline off prednisone.  With regard to his obstructive sleep apnea and obesity hypoventilation syndrome he unfortunately has gained weight.  He has good compliance with his BiPAP as confirmed by his BiPAP download today.  He is having some difficulty with his mask developing defects and cracks.  He needs a DME order today to allow him to get an adequate number replacements given the frequency of breakdown.

## 2018-09-28 NOTE — Telephone Encounter (Signed)
I am sorry that the patient feels this way.  We have provided him recommendations.  He is telling me that he is having back pain, increased shortness of breath which could mean a blood clot.  If we did blood work today we will get those results back until tomorrow with chest x-ray would not further evaluate that.  Chest x-ray could be something that is considered but if were going to need additional blood work then that can be accomplished in the emergency room.  I am sorry that this is frustrating for the patient we are trying to help manage him the best that we can.  If the patient continues to struggle with these recommendations then we can also route this message to Dr. Lamonte Sakai but Dr. Lamonte Sakai is working at the hospital and may not be able to respond for a few days.  This is why an emergency room visit may be a better option.  As previously stated the patient can also follow-up with his primary care provider, or urgent care, or the emergency room.  With worsening symptoms of shortness of breath in the setting of a global pandemic we still need to also be mindful of our own patients who we have here in the office as patient is showing signs of potential symptoms of SARS-CoV-2.  We would need to have a negative SARS-CoV-2 test that is recent on file.  I understand that he has been tested in the past.  But symptoms have worsened since then.  Thus indicating need to test again potentially.  In conclusion: Our recommendation is that the patient present to the emergency room for further evaluation and work-up.   Wyn Quaker, FNP

## 2018-09-28 NOTE — Telephone Encounter (Signed)
Call returned to patient, made aware of B mack recommendations. Patient states basically this is not good enough and he wants to be seen in clinic. Patient went on to repeat that his cardiologist told him to call when he was having issues and he does not understand why no one wants to see him. I made the patient aware we are not cardiology we are pulmonary and we intend to do what was the safest option for him and at this time the safest option per the provider was to present to the ED. He insisted on going back and forth over the phone. I made this patient aware I understood his situation was frustrating however I cannot make him adhere to the recommendations and I will no longer go back and forth with him via phone. He then demanded that he be seen in clinic. Appt made. He then asked well what are they going to do in clinic. I explained I am not a provider I am a nurse, they usually evaluate and treat as such. He went to report that he is gaining weight and his cardiologist told him to call when he has a weight gain but when he called they told him to call us. I explained again that we are pulmonary and we are trying to do what was best for him. Patient remained upset but obliged. Nothing further needed at this time.

## 2018-09-29 ENCOUNTER — Ambulatory Visit: Payer: Medicaid Other | Admitting: Adult Health

## 2018-09-30 IMAGING — CR DG CHEST 2V
2 series · 2 of 2 positions shown · non-contrast
Comparison: February 18, 2016

CLINICAL DATA: Shortness of Breath

EXAM:
CHEST  2 VIEW

[x chest ap]
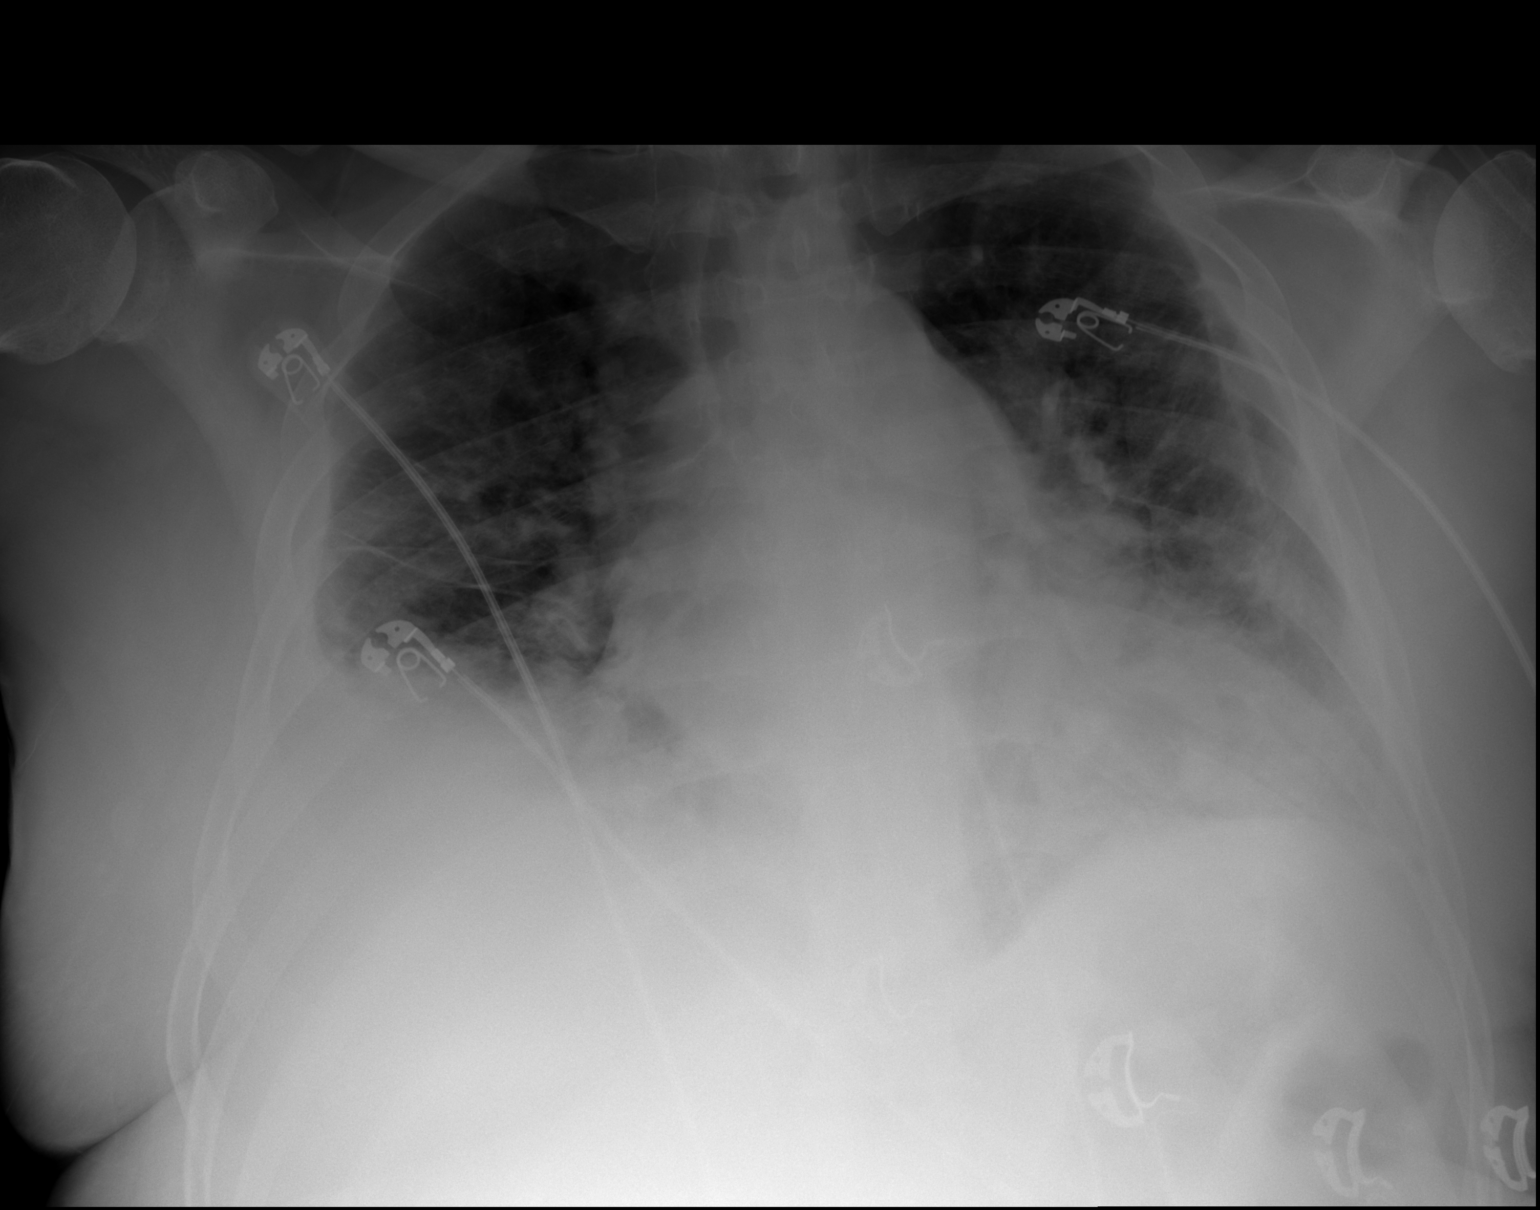

[w chest lat]
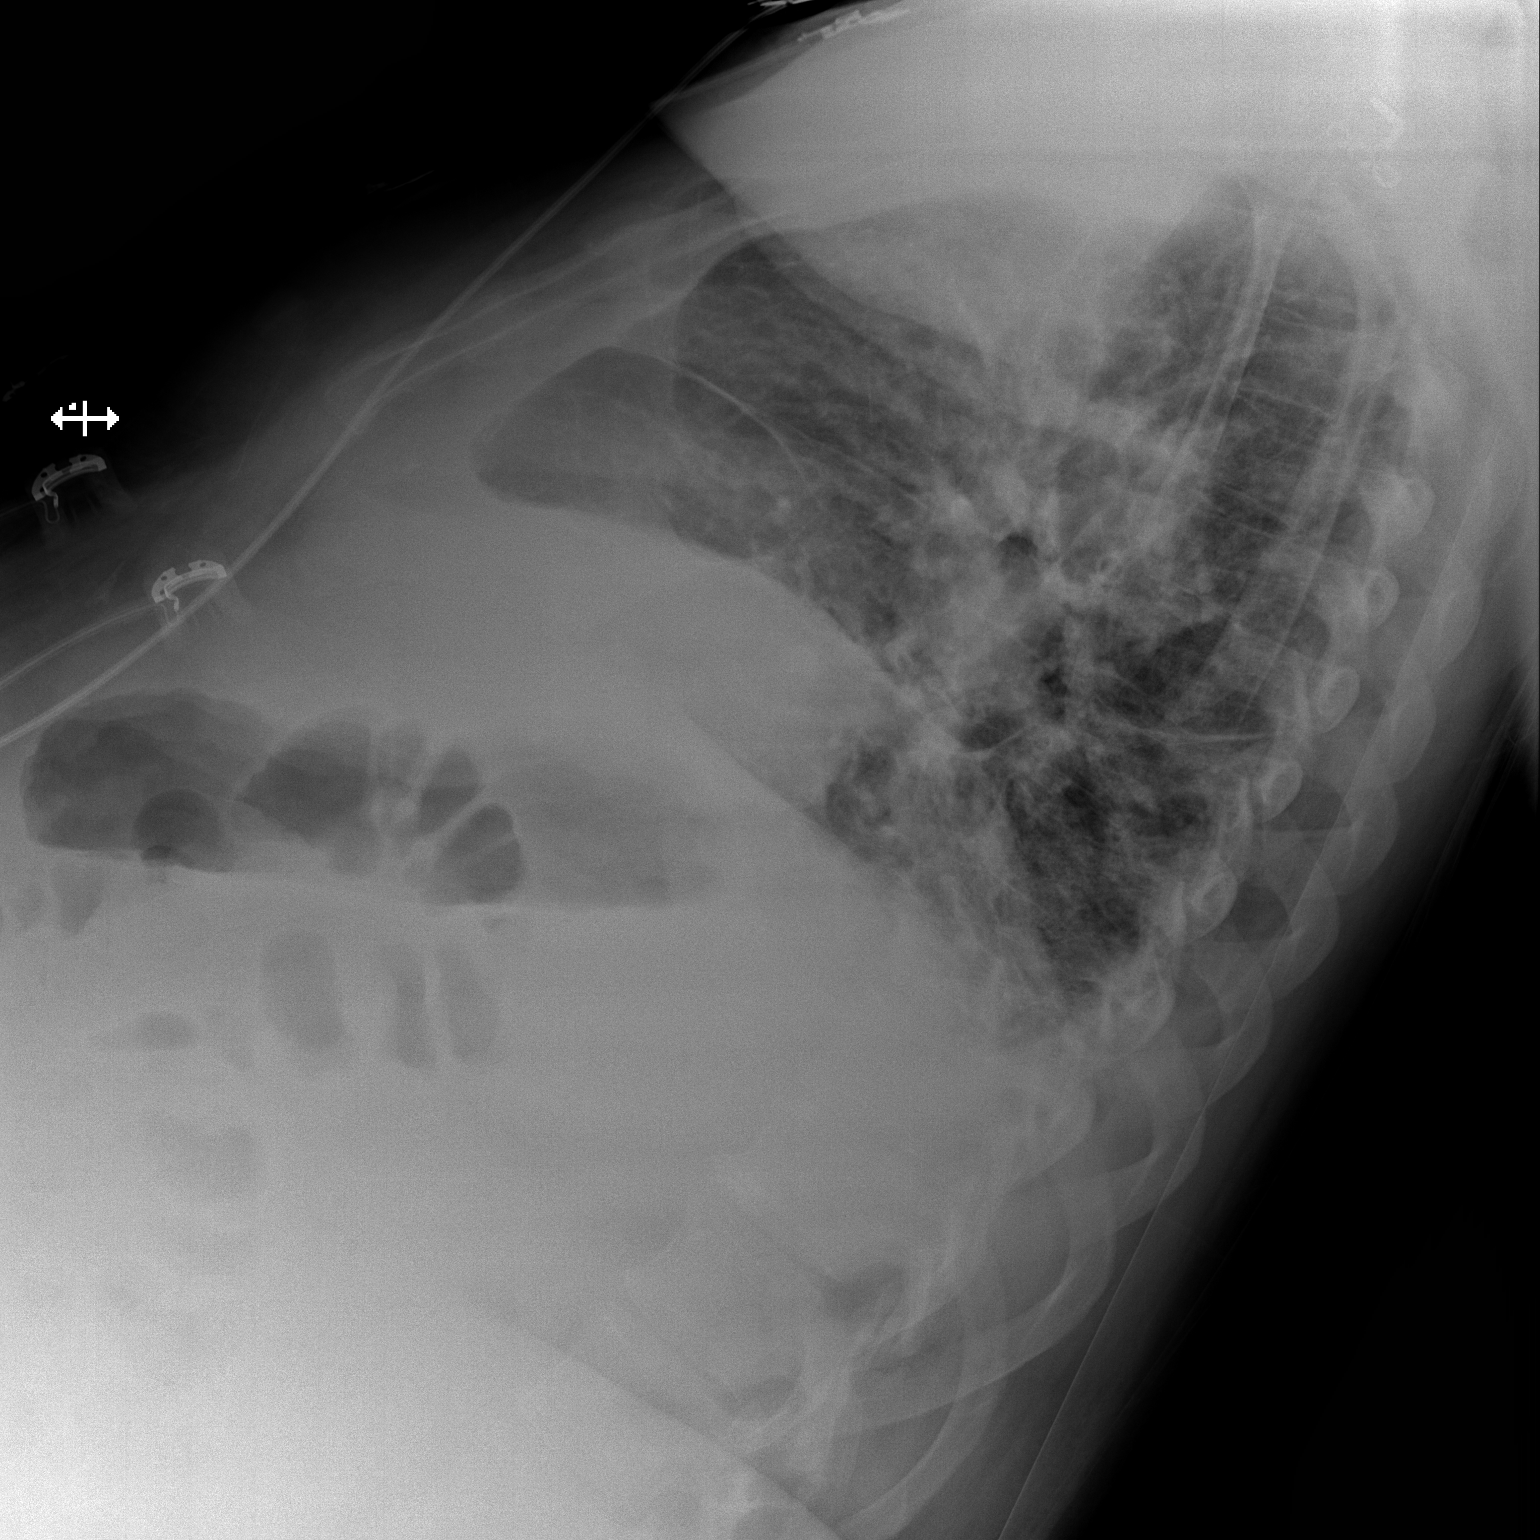

[2 of 2 positions shown; findings below may reference images not displayed]

FINDINGS: There is cardiomegaly with pulmonary venous hypertension. There is a
stable focal right pleural effusion. There is interstitial edema.
There is patchy airspace opacity in the left lower lobe. No
adenopathy. No evident bone lesions.
IMPRESSION: Findings consistent with congestive heart failure. Question alveolar
edema versus patchy pneumonia left lower lobe. Both entities may
exist concurrently.

## 2018-10-03 ENCOUNTER — Ambulatory Visit: Payer: Medicaid Other | Admitting: Family Medicine

## 2018-10-03 IMAGING — DX DG CHEST 1V
1 series · 1 of 1 positions shown · non-contrast
Comparison: November 05, 2016

CLINICAL DATA: Hypercapnic respiratory failure. History of
sarcoidosis.

EXAM:
CHEST 1 VIEW

[chest ap]
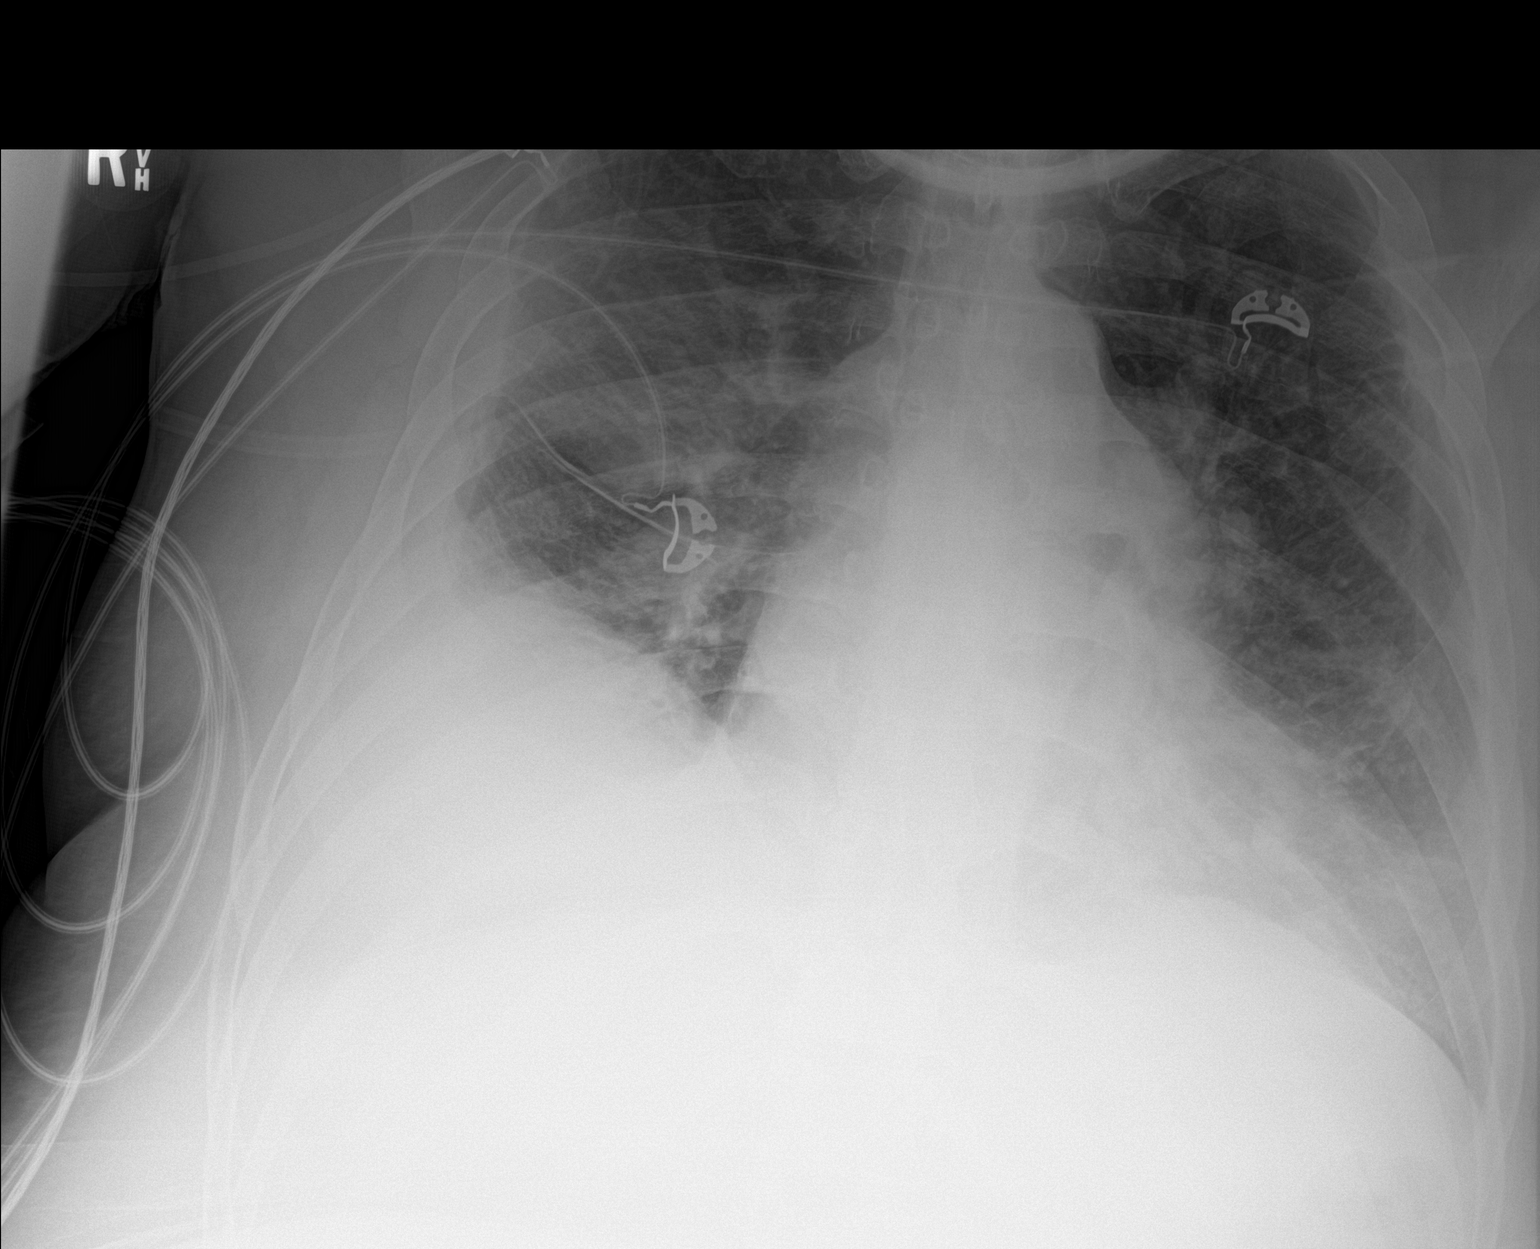

[1 of 1 positions shown; findings below may reference images not displayed]

FINDINGS: There is a moderate partially loculated right pleural effusion.
There is increase in interstitial and patchy alveolar edema with
increased alveolar opacity primarily on the right. There is
cardiomegaly with pulmonary venous hypertension. No adenopathy. No
evident bone lesions.
IMPRESSION: Evidence of congestive heart failure with increase in edema and
slight increase in right pleural effusion. Stable cardiac
enlargement.

## 2018-10-05 ENCOUNTER — Telehealth: Payer: Self-pay | Admitting: Family Medicine

## 2018-10-05 NOTE — Telephone Encounter (Signed)
Whitney with [facility] called on call service. Patient found deceased in his home. Body with great amount of decomposition and thought to be deceased for several weeks. No signs of foul play. Need death certificate signed by his provider. Verbal authorization given by this provider. She will fax death certificate tomorrow for [facility] to sign.

## 2018-10-06 IMAGING — CT CT CHEST HIGH RESOLUTION W/O CM
3 of 6 series · 15 of 36 positions shown, 18 images · non-contrast
Comparison: Chest CT 11/25/2014.

CLINICAL DATA: 46-year-old male with history of sarcoidosis. No
current chest pain or shortness of breath. Low oxygen saturation.

EXAM:
CT CHEST WITHOUT CONTRAST
TECHNIQUE: Multidetector CT imaging of the chest was performed following the
standard protocol without intravenous contrast. High resolution
imaging of the lungs, as well as inspiratory and expiratory imaging,
was performed.

[Series 5: chestw/o 2.0 br40 3 · axial · 0.90mm/px · z∈[+1434,+1674]mm · 9 of 151 slices shown, 12 images]
[im 16/151  mediastinal]
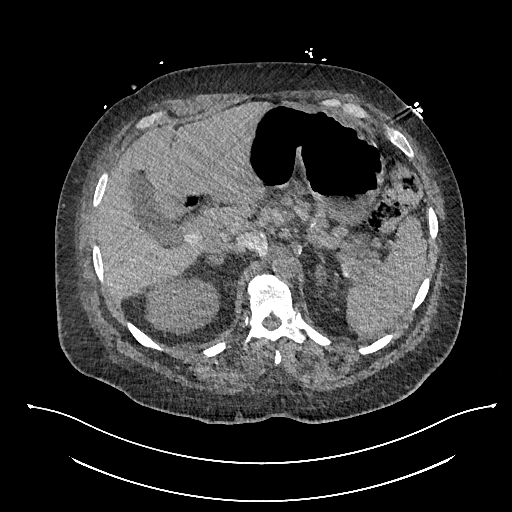
[im 16/151  lung]
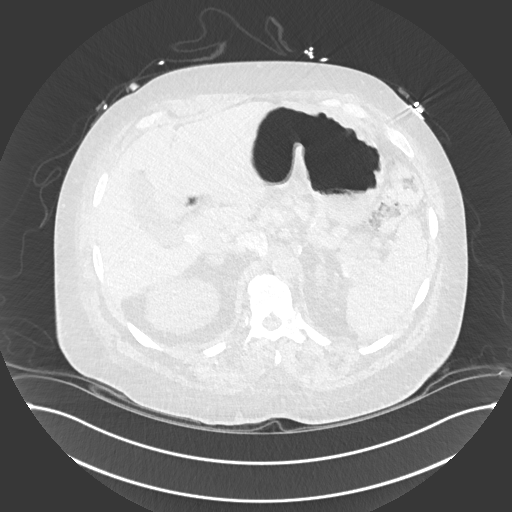
[im 31/151  lung]
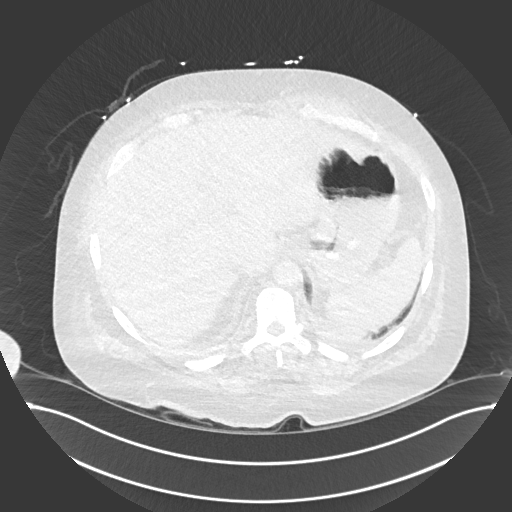
[im 46/151  lung]
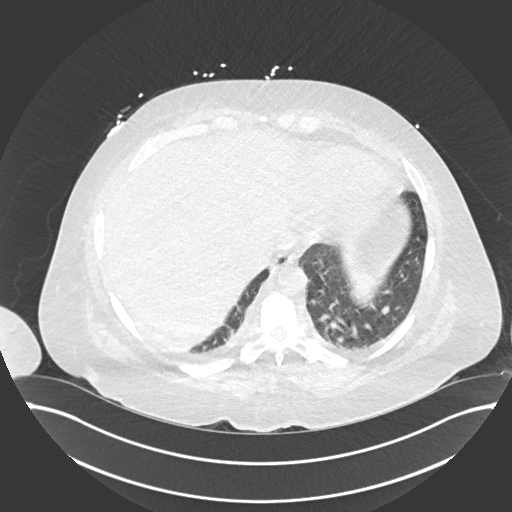
[im 61/151  lung]
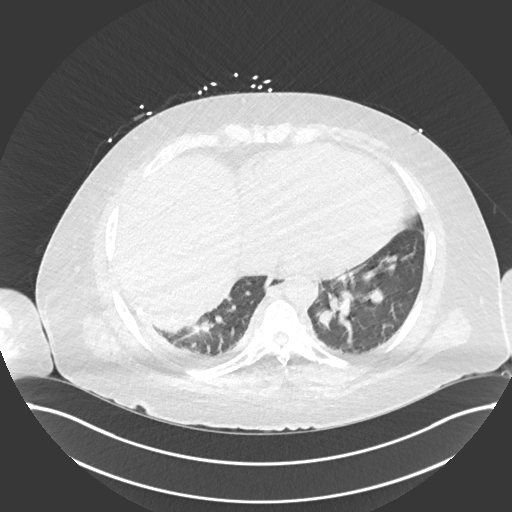
[im 76/151  mediastinal]
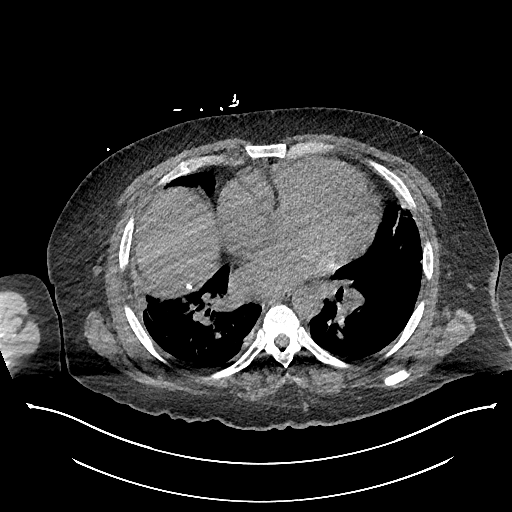
[im 76/151  lung]
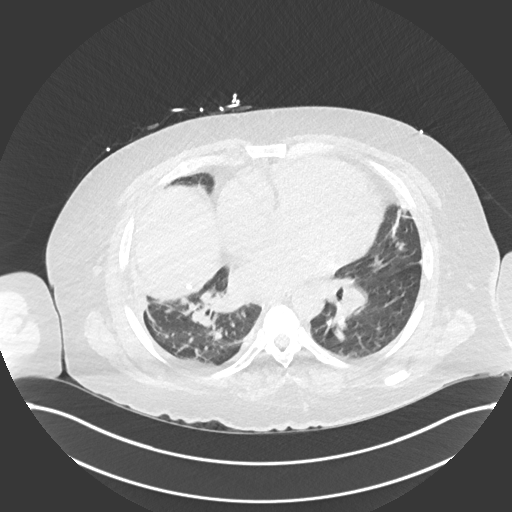
[im 91/151  lung]
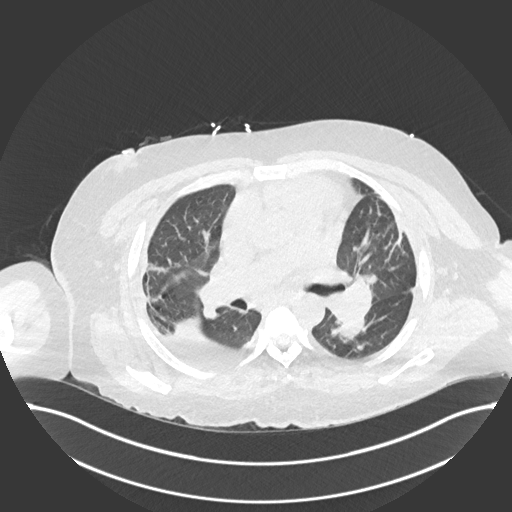
[im 106/151  lung]
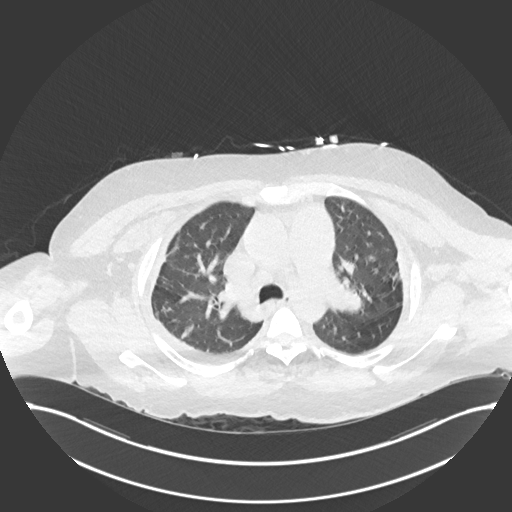
[im 121/151  lung]
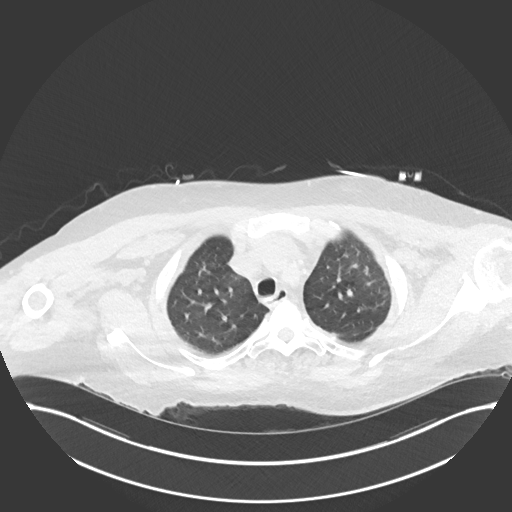
[im 136/151  mediastinal]
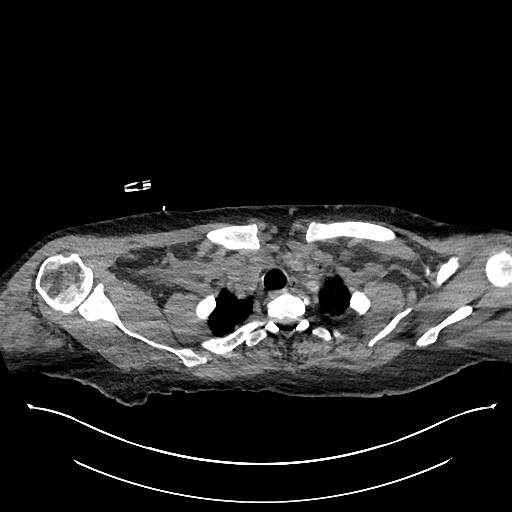
[im 136/151  lung]
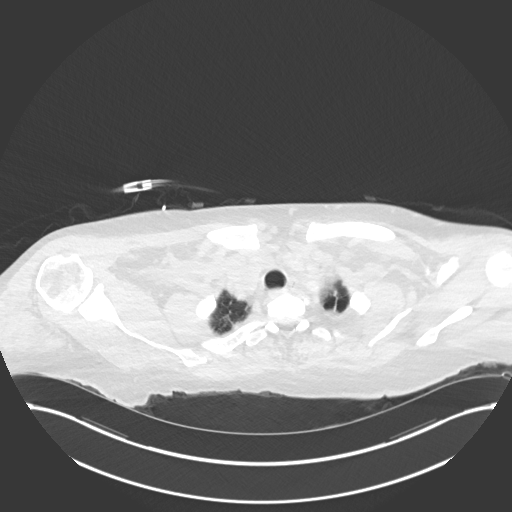

[Series 6: chestw/o 2.0 br59 3 · axial · 0.90mm/px · z∈[+1434,+1494]mm · 3 of 151 slices shown]
[im 16/151  lung]
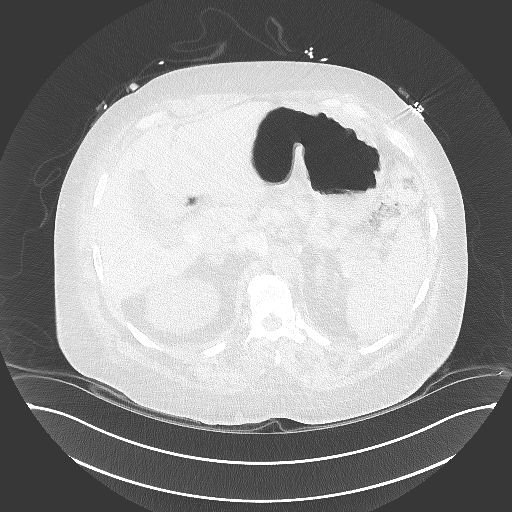
[im 31/151  lung]
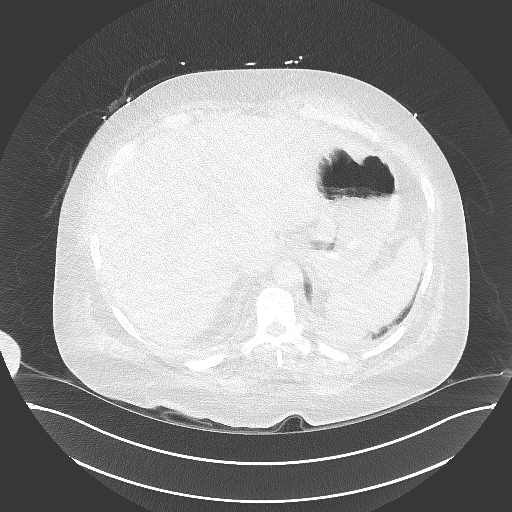
[im 46/151  lung]
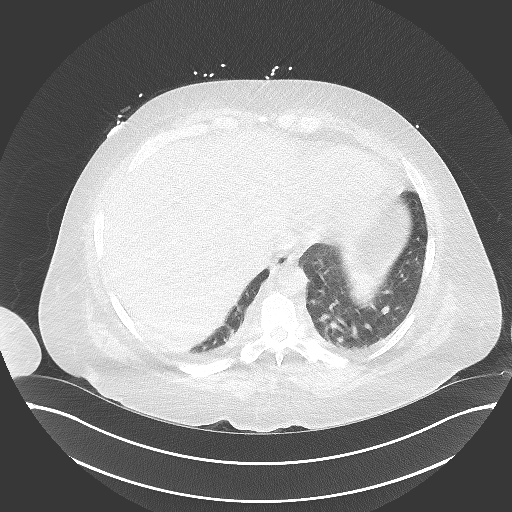

[Series 7: chestw/o 3.0 mpr cor · coronal · 0.62mm/px · 3 of 104 slices shown]
[im 21/104  lung]
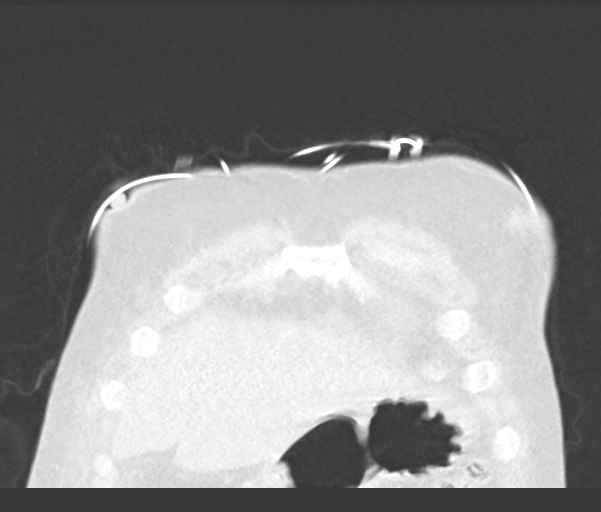
[im 42/104  lung]
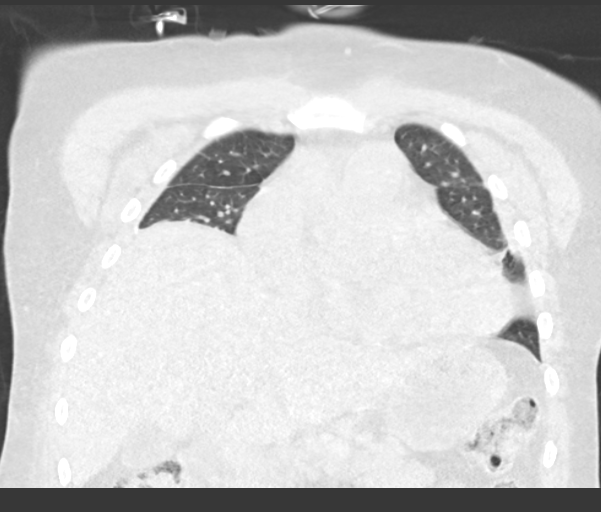
[im 62/104  lung]
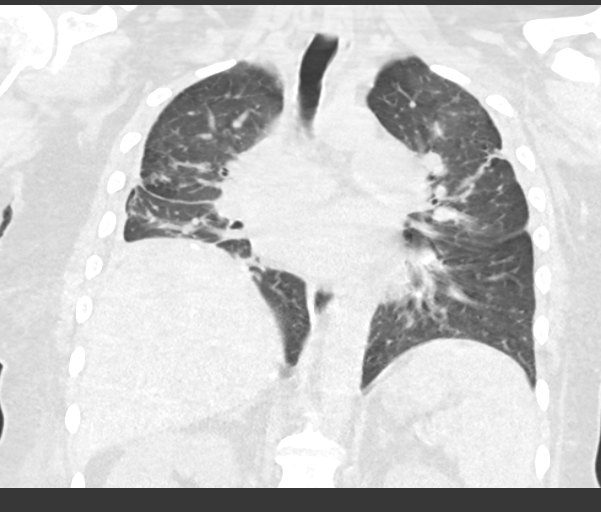

[15 of 36 positions shown; findings below may reference images not displayed]

FINDINGS: Cardiovascular: Heart size is mildly enlarged. There is no
significant pericardial fluid, thickening or pericardial
calcification. No atherosclerotic calcifications are noted in the
thoracic aorta or the coronary arteries. Markedly dilated pulmonic
trunk (4.4 cm in diameter) and central pulmonary arteries,
concerning for pulmonary arterial hypertension.

Mediastinum/Nodes: No pathologically enlarged mediastinal or hilar
lymph nodes. Please note that accurate exclusion of hilar adenopathy
is limited on noncontrast CT scans.

Lungs/Pleura: Chronic right-sided pleural thickening calcification,
similar to the prior study. Trace amount of right-sided pleural
fluid. Chronic areas of pleuroparenchymal architectural distortion
and linear scarring throughout the periphery of the right lung, and
to a lesser extent in the periphery of the left upper lobe. No acute
consolidative airspace disease. No suspicious appearing pulmonary
nodules or masses. High-resolution images demonstrate no significant
regions of ground-glass attenuation, bronchiectasis or honeycombing.
Inspiratory and expiratory imaging is limited by patient motion, but
is generally unremarkable.

Upper Abdomen: Multiple partially calcified borderline enlarged and
mildly enlarged lymph nodes in the upper abdomen, similar to the
prior examinations, presumably related to underlying sarcoidosis.

Musculoskeletal: There are no aggressive appearing lytic or blastic
lesions noted in the visualized portions of the skeleton.
IMPRESSION: 1. Overall, the appearance of the chest appears very similar to
prior study 11/25/2014, and given the patient's history these
findings are likely reflective of chronic sarcoidosis. No new acute
findings are noted.
2. Severe dilatation of the pulmonic trunk and main pulmonary
arteries, suggestive of underlying pulmonary arterial hypertension.
3. Cardiomegaly.
4. Additional incidental findings, as above.

## 2018-10-10 DEATH — deceased

## 2018-10-26 ENCOUNTER — Inpatient Hospital Stay: Admission: RE | Admit: 2018-10-26 | Payer: Medicaid Other | Source: Ambulatory Visit

## 2018-11-06 ENCOUNTER — Encounter (HOSPITAL_COMMUNITY): Payer: Medicaid Other | Admitting: Cardiology

## 2018-12-06 ENCOUNTER — Ambulatory Visit: Payer: Medicaid Other | Admitting: Family Medicine
